# Patient Record
Sex: Female | Born: 1970
Health system: Southern US, Community
[De-identification: ages and names within clinical notes are randomized; demographics above are authoritative.]

## PROBLEM LIST (undated history)

## (undated) DIAGNOSIS — J302 Other seasonal allergic rhinitis: Secondary | ICD-10-CM

## (undated) DIAGNOSIS — M1990 Unspecified osteoarthritis, unspecified site: Secondary | ICD-10-CM

## (undated) DIAGNOSIS — M7750 Other enthesopathy of unspecified foot: Secondary | ICD-10-CM

## (undated) DIAGNOSIS — T7840XA Allergy, unspecified, initial encounter: Secondary | ICD-10-CM

## (undated) DIAGNOSIS — I10 Essential (primary) hypertension: Secondary | ICD-10-CM

## (undated) HISTORY — PX: TUBAL LIGATION: SHX77

## (undated) HISTORY — DX: Allergy, unspecified, initial encounter: T78.40XA

## (undated) HISTORY — DX: Essential (primary) hypertension: I10

## (undated) HISTORY — PX: HEEL SPUR EXCISION: SHX1733

## (undated) HISTORY — PX: CHOLECYSTECTOMY: SHX55

---

## 1997-11-13 ENCOUNTER — Emergency Department (HOSPITAL_COMMUNITY): Admission: EM | Admit: 1997-11-13 | Discharge: 1997-11-13 | Payer: Self-pay | Admitting: Emergency Medicine

## 1997-12-10 ENCOUNTER — Emergency Department (HOSPITAL_COMMUNITY): Admission: EM | Admit: 1997-12-10 | Discharge: 1997-12-10 | Payer: Self-pay | Admitting: *Deleted

## 2003-10-26 ENCOUNTER — Emergency Department (HOSPITAL_COMMUNITY): Admission: EM | Admit: 2003-10-26 | Discharge: 2003-10-26 | Payer: Self-pay | Admitting: Emergency Medicine

## 2004-07-18 ENCOUNTER — Ambulatory Visit: Payer: Self-pay | Admitting: Family Medicine

## 2004-07-20 ENCOUNTER — Ambulatory Visit: Payer: Self-pay | Admitting: Family Medicine

## 2004-07-28 ENCOUNTER — Ambulatory Visit: Payer: Self-pay | Admitting: *Deleted

## 2004-08-10 ENCOUNTER — Emergency Department (HOSPITAL_COMMUNITY): Admission: EM | Admit: 2004-08-10 | Discharge: 2004-08-10 | Payer: Self-pay | Admitting: Family Medicine

## 2005-04-09 ENCOUNTER — Emergency Department (HOSPITAL_COMMUNITY): Admission: EM | Admit: 2005-04-09 | Discharge: 2005-04-09 | Payer: Self-pay | Admitting: Emergency Medicine

## 2005-10-04 ENCOUNTER — Emergency Department (HOSPITAL_COMMUNITY): Admission: EM | Admit: 2005-10-04 | Discharge: 2005-10-04 | Payer: Self-pay | Admitting: Emergency Medicine

## 2005-11-05 ENCOUNTER — Inpatient Hospital Stay (HOSPITAL_COMMUNITY): Admission: EM | Admit: 2005-11-05 | Discharge: 2005-11-06 | Payer: Self-pay | Admitting: Emergency Medicine

## 2006-02-15 ENCOUNTER — Ambulatory Visit: Payer: Self-pay | Admitting: Family Medicine

## 2006-11-28 ENCOUNTER — Emergency Department (HOSPITAL_COMMUNITY): Admission: EM | Admit: 2006-11-28 | Discharge: 2006-11-28 | Payer: Self-pay | Admitting: Emergency Medicine

## 2007-10-01 ENCOUNTER — Emergency Department (HOSPITAL_COMMUNITY): Admission: EM | Admit: 2007-10-01 | Discharge: 2007-10-02 | Payer: Self-pay | Admitting: Emergency Medicine

## 2007-12-03 ENCOUNTER — Emergency Department (HOSPITAL_COMMUNITY): Admission: EM | Admit: 2007-12-03 | Discharge: 2007-12-03 | Payer: Self-pay | Admitting: Emergency Medicine

## 2008-08-30 ENCOUNTER — Emergency Department (HOSPITAL_COMMUNITY): Admission: EM | Admit: 2008-08-30 | Discharge: 2008-08-30 | Payer: Self-pay | Admitting: Emergency Medicine

## 2009-02-19 ENCOUNTER — Emergency Department (HOSPITAL_COMMUNITY): Admission: EM | Admit: 2009-02-19 | Discharge: 2009-02-19 | Payer: Self-pay | Admitting: Emergency Medicine

## 2009-06-01 ENCOUNTER — Emergency Department (HOSPITAL_COMMUNITY): Admission: EM | Admit: 2009-06-01 | Discharge: 2009-06-01 | Payer: Self-pay | Admitting: Emergency Medicine

## 2009-07-13 ENCOUNTER — Emergency Department (HOSPITAL_COMMUNITY): Admission: EM | Admit: 2009-07-13 | Discharge: 2009-07-13 | Payer: Self-pay | Admitting: Emergency Medicine

## 2009-08-02 ENCOUNTER — Inpatient Hospital Stay (HOSPITAL_COMMUNITY): Admission: EM | Admit: 2009-08-02 | Discharge: 2009-08-05 | Payer: Self-pay | Admitting: Emergency Medicine

## 2009-10-17 ENCOUNTER — Emergency Department (HOSPITAL_COMMUNITY): Admission: EM | Admit: 2009-10-17 | Discharge: 2009-10-17 | Payer: Self-pay | Admitting: Emergency Medicine

## 2009-10-18 ENCOUNTER — Inpatient Hospital Stay (HOSPITAL_COMMUNITY): Admission: AD | Admit: 2009-10-18 | Discharge: 2009-10-18 | Payer: Self-pay | Admitting: Obstetrics & Gynecology

## 2010-06-07 ENCOUNTER — Inpatient Hospital Stay (HOSPITAL_COMMUNITY): Admission: EM | Admit: 2010-06-07 | Discharge: 2010-06-10 | Payer: Self-pay | Admitting: Emergency Medicine

## 2010-07-14 ENCOUNTER — Ambulatory Visit: Payer: Self-pay | Admitting: Internal Medicine

## 2010-08-21 ENCOUNTER — Inpatient Hospital Stay (HOSPITAL_COMMUNITY)
Admission: EM | Admit: 2010-08-21 | Discharge: 2010-08-28 | Payer: Self-pay | Source: Home / Self Care | Attending: Internal Medicine | Admitting: Internal Medicine

## 2010-08-22 LAB — DIFFERENTIAL
Basophils Absolute: 0 10*3/uL (ref 0.0–0.1)
Basophils Relative: 0 % (ref 0–1)
Lymphocytes Relative: 39 % (ref 12–46)
Neutro Abs: 2.1 10*3/uL (ref 1.7–7.7)

## 2010-08-22 LAB — BASIC METABOLIC PANEL
Calcium: 8.7 mg/dL (ref 8.4–10.5)
GFR calc Af Amer: >60 mL/min (ref >60)
GFR calc non Af Amer: >60 mL/min (ref >60)
Glucose, Bld: 102 mg/dL — ABNORMAL HIGH (ref 70–99)
Potassium: 3.6 mEq/L (ref 3.5–5.1)
Sodium: 138 mEq/L (ref 135–145)

## 2010-08-22 LAB — CBC
HCT: 34.6 % — ABNORMAL LOW (ref 36.0–46.0)
Hemoglobin: 11.5 g/dL — ABNORMAL LOW (ref 12.0–15.0)
MCHC: 33.2 g/dL (ref 30.0–36.0)
RDW: 14.2 % (ref 11.5–15.5)
WBC: 4.7 10*3/uL (ref 4.0–10.5)

## 2010-08-22 LAB — RAPID STREP SCREEN (MED CTR MEBANE ONLY): Streptococcus, Group A Screen (Direct): NEGATIVE

## 2010-08-23 LAB — BASIC METABOLIC PANEL
CO2: 24 mEq/L (ref 19–32)
Calcium: 9 mg/dL (ref 8.4–10.5)
Creatinine, Ser: 0.65 mg/dL (ref 0.4–1.2)
GFR calc Af Amer: >60 mL/min (ref >60)
GFR calc non Af Amer: >60 mL/min (ref >60)
Sodium: 140 mEq/L (ref 135–145)

## 2010-08-23 LAB — CBC
Hemoglobin: 10.8 g/dL — ABNORMAL LOW (ref 12.0–15.0)
MCH: 28.5 pg (ref 26.0–34.0)
MCHC: 32.7 g/dL (ref 30.0–36.0)
Platelets: 209 10*3/uL (ref 150–400)
RDW: 14.7 % (ref 11.5–15.5)

## 2010-08-23 LAB — STREP A DNA PROBE: Group A Strep Probe: NEGATIVE

## 2010-08-23 LAB — GLUCOSE, CAPILLARY
Glucose-Capillary: 163 mg/dL — ABNORMAL HIGH (ref 70–99)
Glucose-Capillary: 131 mg/dL — ABNORMAL HIGH (ref 70–99)

## 2010-08-24 LAB — GLUCOSE, CAPILLARY
Glucose-Capillary: 144 mg/dL — ABNORMAL HIGH (ref 70–99)
Glucose-Capillary: 140 mg/dL — ABNORMAL HIGH (ref 70–99)

## 2010-08-24 LAB — CBC
HCT: 33.3 % — ABNORMAL LOW (ref 36.0–46.0)
MCH: 28.4 pg (ref 26.0–34.0)
MCHC: 32.4 g/dL (ref 30.0–36.0)
MCV: 87.6 fL (ref 78.0–100.0)
RBC: 3.8 MIL/uL — ABNORMAL LOW (ref 3.87–5.11)
RDW: 15 % (ref 11.5–15.5)

## 2010-08-25 LAB — GLUCOSE, CAPILLARY
Glucose-Capillary: 144 mg/dL — ABNORMAL HIGH (ref 70–99)
Glucose-Capillary: 152 mg/dL — ABNORMAL HIGH (ref 70–99)

## 2010-08-26 LAB — GLUCOSE, CAPILLARY: Glucose-Capillary: 208 mg/dL — ABNORMAL HIGH (ref 70–99)

## 2010-08-27 LAB — GLUCOSE, CAPILLARY: Glucose-Capillary: 148 mg/dL — ABNORMAL HIGH (ref 70–99)

## 2010-08-28 DIAGNOSIS — J383 Other diseases of vocal cords: Secondary | ICD-10-CM

## 2010-08-28 DIAGNOSIS — R0602 Shortness of breath: Secondary | ICD-10-CM

## 2010-08-28 DIAGNOSIS — J45909 Unspecified asthma, uncomplicated: Secondary | ICD-10-CM

## 2010-08-28 LAB — GLUCOSE, CAPILLARY: Glucose-Capillary: 196 mg/dL — ABNORMAL HIGH (ref 70–99)

## 2010-09-10 NOTE — Discharge Summary (Signed)
NAMEWILLENA, Wendy James               ACCOUNT NO.:  [id number]  MEDICAL RECORD NO.:  [id number]          PATIENT TYPE:  INP  LOCATION:  1503                         FACILITY:  [facility]  PHYSICIAN:  Marcellus Scott, MD     DATE OF BIRTH:  [birth year]  DATE OF ADMISSION:  08/21/2010 DATE OF DISCHARGE:  08/28/2010                              DISCHARGE SUMMARY   PRIMARY CARE PHYSICIAN:  Dr. Nanci Pina.  DISCHARGE DIAGNOSES: 1. Dyspnea secondary to vocal cord dysfunction and asthma     exacerbation, improved. 2. Community-acquired pneumonia, treated. 3. Anemia, stable. 4. History of polysubstance abuse, cocaine, tobacco and alcohol.     Cessation counseled. 5. History of pseudoseizures. 6. Hyperglycemia without a diagnosis of diabetes, possibly from     steroids. 7. History of cholecystectomy.  DISCHARGE MEDICATIONS: 1. Albuterol HFA 2 puffs inhaled q. four hourly p.r.n. for dyspnea or     wheezing. 2. Symbicort 160/4.5 mcg inhaler, 2 puffs inhaled b.i.d. 3. Protonix 40 mg p.o. b.i.d. 4. Prednisone taper as per directions.  DISCONTINUED MEDICATIONS:  Advair Diskus.  IMAGING STUDIES: 1. Chest x-ray January 27.  Impression, interval resolution of subtle     left lower lobe infiltrate.  No new focal or acute cardiopulmonary     abnormality suggested. 2. Chest x-ray January 23.  Impression, subtle left lower lobe density     noted in both views.  Suspicious for early left lower lobe     subsegmental atelectasis or atelectatic pneumonia.  PROCEDURES: 1. Pulmonary function tests.  Results are pending. 2. Pertinent labs, serum IgE 773.  CBC, hemoglobin 10.8, hematocrit     33, white blood cell 13.5, platelets 231, hemoglobin A1c 5.6.     Basic metabolic panel only remarkable for glucose of 155.  Group A     strep probe negative.  Rapid strep screen negative.  CONSULTATIONS:  Pulmonary MD, Dr. Sandrea Hughs.  DIET:  Heart-healthy diet.  ACTIVITIES:  Increase activity slowly and  then as tolerated.  COMPLAINTS:  Mild cough and intermittent mild dyspnea but much improved compared to on admission.  PHYSICAL EXAMINATION:  GENERAL:  The patient is in no obvious distress. VITAL SIGNS:  Temperature 98.4 degrees Fahrenheit, pulse 71 per minute, respiration 18 per minute, blood pressure 127/73 mmHg, oxygen saturation of 95% on room air. RESPIRATORY SYSTEM:  No increased work of breathing.  Good breath sounds bilaterally with occasional rhonchi.  CARDIOVASCULAR SYSTEM:  First and second heart sounds heard, regular. ABDOMEN:  Soft and bowel sounds present. CENTRAL NERVOUS SYSTEM:  The patient awake, alert, oriented x3 with no focal neurological deficits.  HOSPITAL COURSE:  Wendy James is a 40 year old female patient with history of asthma, seizures versus pseudoseizures, prior substance abuse including alcohol, cocaine and tobacco who presented with worsening dyspnea, sore throat, productive cough.  She was admitted for further evaluation and management.  1. Dyspnea secondary to vocal cord dysfunction and acute asthma     exacerbation.  The patient was admitted to the hospital.     Initially, it was thought that this was all due to asthma     exacerbation from her  pneumonia.  She was placed on IV steroids,     nebulizations and oxygen.  Despite treating her pneumonia and     measures for asthma, dyspnea actually did not resolve promptly.     Pulmonary physicians were consulted.  They evaluated her and     indicated that most of her dyspnea was probably secondary to vocal     cord dysfunction and the powdered Advair was making it worse.  They     discontinued her Advair and started her on Symbicort.  She has     clinically done well.  She has completed her lung functions today.     Pulmonologist have seen her today and indicate that she can be     discharged home on the above medications.  She is to follow up with     Dr. Sherene Sires in 2 weeks in his office with all inhalers  and     medications. 2. Community-acquired pneumonia.  Treated. 3. History of polysubstance abuse.  Cessation counseling advised and     the patient verbalizes understanding. 4. History of chronic anemia.  Outpatient evaluation as deemed     necessary.  DISPOSITION:  The patient is discharged home in a stable condition.  FOLLOWUP RECOMMENDATIONS: 1. With Dr. Nanci Pina at [facility] on February 1 at     11:15 a.m. 2. With Dr.  Sandrea Hughs.  In 2 weeks' time, the patient is to call     for an appointment and is to present herself with all the inhalers     and medications.  Time taken in coordinating this discharge is 35 minutes.     Marcellus Scott, MD     AH/MEDQ  D:  08/28/2010  T:  08/29/2010  Job:  [id number]  cc:   Dr. Leland Johns. Sherene Sires, MD, FCCP [address] [place] [address]  Electronically Signed by Marcellus Scott MD on 09/10/2010 11:01:31 PM

## 2010-09-26 NOTE — Consult Note (Signed)
NAMEGENEVEIVE, FURNESS NO.:  [id number]  MEDICAL RECORD NO.:  [id number]          PATIENT TYPE:  INP  LOCATION:  1503                         FACILITY:  [facility]  PHYSICIAN:  Felipa Evener, MD  DATE OF BIRTH:  Sep 14, 1970  DATE OF CONSULTATION: DATE OF DISCHARGE:                                CONSULTATION   IDENTIFICATION:  The patient is a 40 year old female with past medical history significant for asthma, as well as a pseudoseizure, and cocaine abuse which last use reported in 2007 who presents to the hospital with a chief complaint of shortness of breath.  The patient is a [facility] patient and has been using Advair 250/50 one puff only once daily in order to conserve it since she has no money to buy more, and she has been using albuterol only intermittently approximately once a day on regular basis except for the last 3 days prior to presenting to the hospital where she was using it approximately 4-5 times a day.  The patient reports that her symptoms are mostly nocturnal, and she uses the Advair in the morning.  She reports some cough productive of green sputum that has cleared with antibiotics since the patient has been in the hospital.  She denied any fever, chills, nausea, vomiting, abdominal pain, or chest pain.  Denied any changes in her urine or bowel habits. Denied any recent exposure to any chemicals.  Denied any exacerbating factors for her asthma.  The patient does report significant history of acid reflux disease, as well as frequent throat clearings.  PAST MEDICAL HISTORY:  Significant for asthma, no previous intubation but pseudoseizures without recurrence several years ago, history of alcohol abuse, history of cocaine abuse with last urine drug screen positive in 2007, and remote cholecystectomy.  ALLERGIES:  No known drug allergies.  MEDICATION:  Advair 250/50 one puff b.i.d.  FAMILY HISTORY:  Positive for mother, as well as a son,  with asthma but otherwise noncontributory.  SOCIAL HISTORY:  She is single, currently living with her cousin. Remote history of tobacco abuse; however, she denies alcohol or illegal drug use.  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  A 12-point review of systems is negative other than mentioned above.  PHYSICAL EXAMINATION:  GENERAL:  This is a well-appearing African American female, resting comfortably in the exam bed, in acute distress. VITAL SIGNS:  Temperature 99.4, heart rate 80, respiratory rate is 18, blood pressure 125/77, and saturation 94% on room air. HEENT:  Normocephalic and atraumatic.  Pupils equal, round, and reactive to light.  Extraocular movements are intact.  Oral and nasal mucosa within normal limits. NECK:  No thyromegaly or lymphadenopathy.  No hepatojugular reflux appreciated. HEART:  Regular rate and rhythm.  S1 and S2.  No murmurs, rubs, or gallops appreciated. LUNGS:  Inspiratory and expiratory wheezes that increased in intensity as I approach the apices of the lungs.  There was significant "wheezing" that was present around the neck that disappeared with the patient's pursed lip breathing. ABDOMEN:  Soft, nontender.  Positive bowel sounds. EXTREMITIES:  No  edema, __________ appreciated. NEUROLOGIC:  Grossly intact.  LABORATORY STUDIES:  Reviewed, significant for CBC with a white blood cell count of 13.5, hemoglobin 10.8 hematocrit 32.3, and a platelet count of 231.  Hemoglobin A1c 5.6.  BMP:  Sodium 140, potassium 4.2, chloride 110, CO2 of 24, glucose of 155, BUN is 8, creatinine is 0.65, and calcium 9.0.  Group A strep from throat was also negative.  ASSESSMENT AND PLAN:  The patient is a 40 year old female with past medical history significant for pseudoseizure, cocaine abuse who presented to the hospital with an asthma exacerbation that did not resolve with all current treatment  and did not resolve with continuous albuterol.  The patient does have significant acid reflux, as well as vocal cord dysfunction, and the patient has not been using her Advair properly, she has only been using once a day and reports that most of her symptoms are at night which will be consistent with half life of Advair.  Therefore, at this time, we will recommend increasing that Advair 250/50 one puff b.i.d.  We will place the patient on Protonix on a b.i.d. basis.  I agree with the steroids per the primary team as well as the antibiotic use.  We will add p.r.n. albuterol as well as IS per RT protocol.  The patient will need some PFTs once is more stable and will need a followup with Pulmonary Critical Care in [place] office upon discharge, and at this point, we will continue to follow with you.     Felipa Evener, MD     WJY/MEDQ  D:  08/26/2010  T:  08/27/2010  Job:  [id number]  Electronically Signed by Koren Bound MD on 09/26/2010 11:52:18 AM

## 2010-10-10 LAB — CBC
Hemoglobin: 10.3 g/dL — ABNORMAL LOW (ref 12.0–15.0)
Hemoglobin: 10.6 g/dL — ABNORMAL LOW (ref 12.0–15.0)
MCH: 31.1 pg (ref 26.0–34.0)
MCH: 31.4 pg (ref 26.0–34.0)
MCH: 31.5 pg (ref 26.0–34.0)
MCHC: 33.8 g/dL (ref 30.0–36.0)
MCHC: 33.8 g/dL (ref 30.0–36.0)
MCV: 91.6 fL (ref 78.0–100.0)
MCV: 92.8 fL (ref 78.0–100.0)
Platelets: 275 10*3/uL (ref 150–400)
Platelets: 302 10*3/uL (ref 150–400)
RBC: 3.36 MIL/uL — ABNORMAL LOW (ref 3.87–5.11)
RBC: 3.51 MIL/uL — ABNORMAL LOW (ref 3.87–5.11)
RDW: 14.6 % (ref 11.5–15.5)
RDW: 14.7 % (ref 11.5–15.5)
WBC: 7.3 10*3/uL (ref 4.0–10.5)

## 2010-10-10 LAB — COMPREHENSIVE METABOLIC PANEL
ALT: 25 U/L (ref 0–35)
Albumin: 3.2 g/dL — ABNORMAL LOW (ref 3.5–5.2)
Alkaline Phosphatase: 59 U/L (ref 39–117)
GFR calc Af Amer: >60 mL/min (ref >60)
Potassium: 3.7 mEq/L (ref 3.5–5.1)
Sodium: 141 mEq/L (ref 135–145)
Total Protein: 7.3 g/dL (ref 6.0–8.3)

## 2010-10-10 LAB — FERRITIN: Ferritin: 20 ng/mL (ref 10–291)

## 2010-10-10 LAB — DIFFERENTIAL
Basophils Absolute: 0 10*3/uL (ref 0.0–0.1)
Basophils Relative: 0 % (ref 0–1)
Basophils Relative: 0 % (ref 0–1)
Eosinophils Absolute: 0 10*3/uL (ref 0.0–0.7)
Eosinophils Absolute: 0 10*3/uL (ref 0.0–0.7)
Eosinophils Relative: 0 % (ref 0–5)
Eosinophils Relative: 0 % (ref 0–5)
Lymphs Abs: 2.8 10*3/uL (ref 0.7–4.0)
Monocytes Relative: 3 % (ref 3–12)
Neutrophils Relative %: 72 % (ref 43–77)
Neutrophils Relative %: 89 % — ABNORMAL HIGH (ref 43–77)

## 2010-10-10 LAB — BASIC METABOLIC PANEL
BUN: 10 mg/dL (ref 6–23)
CO2: 24 mEq/L (ref 19–32)
Chloride: 107 mEq/L (ref 96–112)
Creatinine, Ser: 0.74 mg/dL (ref 0.4–1.2)
GFR calc Af Amer: >60 mL/min (ref >60)

## 2010-10-10 LAB — IRON AND TIBC
Iron: 44 ug/dL (ref 42–135)
UIBC: 304 ug/dL

## 2010-10-10 LAB — RETICULOCYTES
RBC.: 3.54 MIL/uL — ABNORMAL LOW (ref 3.87–5.11)
Retic Ct Pct: 1.4 % (ref 0.4–3.1)

## 2010-10-10 NOTE — H&P (Signed)
NAMEJERA, HEADINGS               ACCOUNT NO.:  [id number]  MEDICAL RECORD NO.:  [id number]          PATIENT TYPE:  INP  LOCATION:  0101                         FACILITY:  [facility]  PHYSICIAN:  Erick Blinks, MD     DATE OF BIRTH:  12/27/1970  DATE OF ADMISSION:  08/21/2010 DATE OF DISCHARGE:                             HISTORY & PHYSICAL   PRIMARY CARE PHYSICIAN:  Insurance underwriter.  CHIEF COMPLAINT:  Shortness of breath.  HISTORY OF PRESENT ILLNESS:  Ms. Wendy James is a 40 year old African American female with past medical history of asthma, who presents to [facility] emergency room with reports of progressive shortness of breath for approximately 2 days.  The patient also reports sore throat and cough, productive of green sputum for 2 days.  She denies any recent fever, chills, chest pain, abdominal pain, nausea, vomiting, diarrhea.  The patient reports symptoms have improved with 1-hour long nebulizer treatments administered in the emergency department, however, the patient does have persistent wheezing prompting triad hospitalist be called for admission.  PAST MEDICAL HISTORY: 1. Asthma. 2. Probable seizure without recurrence. 3. History of EtOH abuse. 4. Cocaine abuse with positive urine drug screen in 2007 admission. 5. Remote cholecystectomy.  ALLERGIES:  No known drug allergies.  MEDICATIONS:  Advair 250/50 inhaled b.i.d.  FAMILY HISTORY:  Has been reviewed and is noncontributory to this admission.  SOCIAL HISTORY:  The patient is single.  She is currently living with a cousin.  She reports of a remote history of tobacco abuse.  However, she denies any current tobacco abuse, EtOH use or illegal drug use.  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  REVIEW OF SYSTEMS:  As stated in HPI, otherwise negative.  PHYSICAL EXAMINATION:  VITAL SIGNS:  Blood pressure 152/89, heart rate 100, respirations 24, temperature 100.6. GENERAL:  This is a  well-nourished, well-developed [place] American female lying in stretcher talking on the phone on my arrival to room. HEENT:  Head is normocephalic, atraumatic.  Eyes, extraocular movements intact.  Ears, nose, throat, mucous membranes are moist.  The patient does have some posterior pharynx erythema without any swelling or exudates.  Posterior tongue does have some raised bumps, uncertain whether these are chronic. NECK:  Supple with no thyromegaly, lymphadenopathy.  No JVD or carotid bruits. CHEST:  Symmetrical movement, nontender palpation. CARDIOVASCULAR:  S1 and S2, regular rate and rhythm.  No murmurs, rubs, or gallops. EXTREMITIES:  No lower extremity edema. RESPIRATORY:  Patient with end expiratory wheeze bilaterally throughout all lung fields.  No increased work of breathing.  No rales or crackles. GASTROINTESTINAL:  Abdomen is soft, nontender, nondistended with positive bowel sounds.  No appreciated masses or hepatosplenomegaly. NEUROLOGICAL:  The patient is able to move all extremities x4 withoutmotor sensory deficit on exam. PSYCHOLOGICAL:  The patient is alert and oriented x4 with normal mood and affect.  LABORATORY DATA:  White cell count 4.7, platelet count 201, hemoglobin 11.5, hematocrit 34.6, sodium 138, potassium 3.6, chloride 108, CO2 22, BUN 5, creatinine 0.67, serum glucose 102.  Rapid strep is negative.  Chest x-ray showing left lower lobe atelectasis  versus pneumonia.  ASSESSMENT/PLAN: 1. Acute asthma exacerbation.  We will admit the patient overnight to     regular medical floor.  We will order for nebulizer treatments and     IV Solu-Medrol. 2. Community-acquired pneumonia.  We will order for empiric Avelox.     The patient is afebrile with normal white cell count and nontoxic-     appearing. 3. Prophylaxis.  We will order for subcu Lovenox for DVT prophylaxis.     Cordelia Pen, NP   ______________________________ Erick Blinks,  MD    LE/MEDQ  D:  08/21/2010  T:  08/21/2010  Job:  [id number]  cc:   Dr. Dennard Nip [facility]  Electronically Signed by Cordelia Pen NP on 09/15/2010 12:06:54 PM Electronically Signed by Durward Mallard Ariell Gunnels  on 10/10/2010 05:45:25 PM

## 2010-10-15 LAB — URINALYSIS, ROUTINE W REFLEX MICROSCOPIC
Bilirubin Urine: NEGATIVE
Glucose, UA: NEGATIVE mg/dL
Hgb urine dipstick: NEGATIVE
Ketones, ur: NEGATIVE mg/dL
Nitrite: NEGATIVE
Protein, ur: NEGATIVE mg/dL
Specific Gravity, Urine: 1.018 (ref 1.005–1.030)
Urobilinogen, UA: 1 mg/dL (ref 0.0–1.0)
pH: 6 (ref 5.0–8.0)

## 2010-10-15 LAB — CULTURE, RESPIRATORY W GRAM STAIN: Culture: NORMAL

## 2010-10-15 LAB — CBC
HCT: 35.8 % — ABNORMAL LOW (ref 36.0–46.0)
Hemoglobin: 12.1 g/dL (ref 12.0–15.0)
MCHC: 32.8 g/dL (ref 30.0–36.0)
MCHC: 33.8 g/dL (ref 30.0–36.0)
MCV: 90.6 fL (ref 78.0–100.0)
MCV: 90.7 fL (ref 78.0–100.0)
Platelets: 216 10*3/uL (ref 150–400)
Platelets: 219 K/uL (ref 150–400)
RBC: 3.95 MIL/uL (ref 3.87–5.11)
RDW: 14.2 % (ref 11.5–15.5)
RDW: 15.4 % (ref 11.5–15.5)
WBC: 3.2 10*3/uL — ABNORMAL LOW (ref 4.0–10.5)

## 2010-10-15 LAB — DIFFERENTIAL
Basophils Absolute: 0 K/uL (ref 0.0–0.1)
Basophils Relative: 0 % (ref 0–1)
Eosinophils Absolute: 0 K/uL (ref 0.0–0.7)
Eosinophils Relative: 0 % (ref 0–5)
Lymphocytes Relative: 21 % (ref 12–46)
Lymphs Abs: 0.7 K/uL (ref 0.7–4.0)
Monocytes Absolute: 0.1 10*3/uL (ref 0.1–1.0)
Monocytes Relative: 4 % (ref 3–12)
Neutro Abs: 2.4 10*3/uL (ref 1.7–7.7)
Neutrophils Relative %: 75 % (ref 43–77)

## 2010-10-15 LAB — BASIC METABOLIC PANEL WITH GFR
BUN: 6 mg/dL (ref 6–23)
CO2: 24 meq/L (ref 19–32)
Chloride: 106 meq/L (ref 96–112)
Glucose, Bld: 136 mg/dL — ABNORMAL HIGH (ref 70–99)
Potassium: 3.6 meq/L (ref 3.5–5.1)

## 2010-10-15 LAB — BASIC METABOLIC PANEL
BUN: 11 mg/dL (ref 6–23)
CO2: 25 mEq/L (ref 19–32)
Calcium: 8.5 mg/dL (ref 8.4–10.5)
Calcium: 8.6 mg/dL (ref 8.4–10.5)
Chloride: 107 mEq/L (ref 96–112)
Creatinine, Ser: 0.72 mg/dL (ref 0.4–1.2)
Creatinine, Ser: 0.76 mg/dL (ref 0.4–1.2)
GFR calc Af Amer: >60 mL/min (ref >60)
GFR calc Af Amer: >60 mL/min (ref >60)
GFR calc non Af Amer: >60 mL/min (ref >60)
Sodium: 138 mEq/L (ref 135–145)

## 2010-10-15 LAB — HEMOGLOBIN A1C
Hgb A1c MFr Bld: 5.5 % (ref 4.6–6.1)
Mean Plasma Glucose: 111 mg/dL

## 2010-10-23 LAB — CBC
Hemoglobin: 11.4 g/dL — ABNORMAL LOW (ref 12.0–15.0)
MCHC: 32.9 g/dL (ref 30.0–36.0)
MCHC: 33.7 g/dL (ref 30.0–36.0)
MCV: 92.1 fL (ref 78.0–100.0)
Platelets: 180 10*3/uL (ref 150–400)
RBC: 3.76 MIL/uL — ABNORMAL LOW (ref 3.87–5.11)
RDW: 16.1 % — ABNORMAL HIGH (ref 11.5–15.5)

## 2010-10-23 LAB — URINE MICROSCOPIC-ADD ON

## 2010-10-23 LAB — URINALYSIS, ROUTINE W REFLEX MICROSCOPIC
Bilirubin Urine: NEGATIVE
Leukocytes, UA: NEGATIVE
Nitrite: NEGATIVE
Specific Gravity, Urine: 1.013 (ref 1.005–1.030)
Urobilinogen, UA: 0.2 mg/dL (ref 0.0–1.0)

## 2010-10-23 LAB — DIFFERENTIAL
Basophils Absolute: 0 10*3/uL (ref 0.0–0.1)
Basophils Relative: 1 % (ref 0–1)
Lymphocytes Relative: 51 % — ABNORMAL HIGH (ref 12–46)
Monocytes Absolute: 0.3 10*3/uL (ref 0.1–1.0)
Neutro Abs: 1.6 10*3/uL — ABNORMAL LOW (ref 1.7–7.7)
Neutrophils Relative %: 37 % — ABNORMAL LOW (ref 43–77)

## 2010-10-23 LAB — POCT I-STAT, CHEM 8
Calcium, Ion: 1.09 mmol/L — ABNORMAL LOW (ref 1.12–1.32)
Chloride: 109 mEq/L (ref 96–112)
HCT: 37 % (ref 36.0–46.0)
Potassium: 3.7 mEq/L (ref 3.5–5.1)
Sodium: 139 mEq/L (ref 135–145)

## 2010-10-23 LAB — POCT PREGNANCY, URINE: Preg Test, Ur: NEGATIVE

## 2010-11-05 LAB — WET PREP, GENITAL
Clue Cells Wet Prep HPF POC: NONE SEEN
Trich, Wet Prep: NONE SEEN

## 2010-11-05 LAB — URINALYSIS, ROUTINE W REFLEX MICROSCOPIC
Bilirubin Urine: NEGATIVE
Hgb urine dipstick: NEGATIVE
Ketones, ur: NEGATIVE mg/dL
Nitrite: NEGATIVE
Specific Gravity, Urine: 1.036 — ABNORMAL HIGH (ref 1.005–1.030)
Urobilinogen, UA: 0.2 mg/dL (ref 0.0–1.0)

## 2010-11-05 LAB — GC/CHLAMYDIA PROBE AMP, GENITAL
Chlamydia, DNA Probe: NEGATIVE
GC Probe Amp, Genital: NEGATIVE

## 2010-11-05 LAB — PREGNANCY, URINE: Preg Test, Ur: NEGATIVE

## 2010-11-10 ENCOUNTER — Inpatient Hospital Stay (INDEPENDENT_AMBULATORY_CARE_PROVIDER_SITE_OTHER)
Admission: RE | Admit: 2010-11-10 | Discharge: 2010-11-10 | Disposition: A | Discharge status: Home / Self Care | Payer: Self-pay | Source: Ambulatory Visit | Attending: Family Medicine | Admitting: Family Medicine

## 2010-11-10 DIAGNOSIS — A64 Unspecified sexually transmitted disease: Secondary | ICD-10-CM

## 2010-11-10 LAB — POCT URINALYSIS DIP (DEVICE)
Glucose, UA: NEGATIVE mg/dL
Ketones, ur: NEGATIVE mg/dL
Protein, ur: NEGATIVE mg/dL
Specific Gravity, Urine: 1.015 (ref 1.005–1.030)
Urobilinogen, UA: 0.2 mg/dL (ref 0.0–1.0)

## 2010-11-10 LAB — POCT PREGNANCY, URINE: Preg Test, Ur: NEGATIVE

## 2010-11-10 LAB — WET PREP, GENITAL: Yeast Wet Prep HPF POC: NONE SEEN

## 2010-11-11 LAB — GC/CHLAMYDIA PROBE AMP, GENITAL: Chlamydia, DNA Probe: NEGATIVE

## 2010-12-10 ENCOUNTER — Emergency Department (HOSPITAL_COMMUNITY): Payer: Self-pay

## 2010-12-10 ENCOUNTER — Emergency Department (HOSPITAL_COMMUNITY)
Admission: EM | Admit: 2010-12-10 | Discharge: 2010-12-11 | Disposition: A | Discharge status: Home / Self Care | Payer: Self-pay | Attending: Emergency Medicine | Admitting: Emergency Medicine

## 2010-12-10 DIAGNOSIS — M62838 Other muscle spasm: Secondary | ICD-10-CM | POA: Insufficient documentation

## 2010-12-10 DIAGNOSIS — R109 Unspecified abdominal pain: Secondary | ICD-10-CM | POA: Insufficient documentation

## 2010-12-10 DIAGNOSIS — J45909 Unspecified asthma, uncomplicated: Secondary | ICD-10-CM | POA: Insufficient documentation

## 2010-12-10 DIAGNOSIS — M549 Dorsalgia, unspecified: Secondary | ICD-10-CM | POA: Insufficient documentation

## 2010-12-10 DIAGNOSIS — R1110 Vomiting, unspecified: Secondary | ICD-10-CM | POA: Insufficient documentation

## 2010-12-10 LAB — COMPREHENSIVE METABOLIC PANEL
ALT: 20 U/L (ref 0–35)
Alkaline Phosphatase: 69 U/L (ref 39–117)
Chloride: 104 mEq/L (ref 96–112)
Glucose, Bld: 112 mg/dL — ABNORMAL HIGH (ref 70–99)
Potassium: 4.1 mEq/L (ref 3.5–5.1)
Sodium: 138 mEq/L (ref 135–145)
Total Bilirubin: 0.4 mg/dL (ref 0.3–1.2)
Total Protein: 7.2 g/dL (ref 6.0–8.3)

## 2010-12-10 LAB — URINALYSIS, ROUTINE W REFLEX MICROSCOPIC
Bilirubin Urine: NEGATIVE
Glucose, UA: NEGATIVE mg/dL
Hgb urine dipstick: NEGATIVE
Nitrite: NEGATIVE
Specific Gravity, Urine: 1.021 (ref 1.005–1.030)
pH: 7.5 (ref 5.0–8.0)

## 2010-12-10 LAB — CBC
HCT: 33.9 % — ABNORMAL LOW (ref 36.0–46.0)
Hemoglobin: 11.1 g/dL — ABNORMAL LOW (ref 12.0–15.0)
MCH: 28.5 pg (ref 26.0–34.0)
MCV: 87.1 fL (ref 78.0–100.0)
Platelets: 222 10*3/uL (ref 150–400)
RBC: 3.89 MIL/uL (ref 3.87–5.11)
WBC: 5.5 10*3/uL (ref 4.0–10.5)

## 2010-12-10 LAB — DIFFERENTIAL
Lymphocytes Relative: 52 % — ABNORMAL HIGH (ref 12–46)
Lymphs Abs: 2.9 10*3/uL (ref 0.7–4.0)
Monocytes Relative: 11 % (ref 3–12)
Neutro Abs: 1.8 10*3/uL (ref 1.7–7.7)
Neutrophils Relative %: 33 % — ABNORMAL LOW (ref 43–77)

## 2010-12-10 LAB — LIPASE, BLOOD: Lipase: 37 U/L (ref 11–59)

## 2010-12-15 NOTE — Consult Note (Signed)
NAMEMARVINA, James               ACCOUNT NO.:  [id number]   MEDICAL RECORD NO.:  [id number]          PATIENT TYPE:  INP   LOCATION:  1426                         FACILITY:  [facility]   PHYSICIAN:  Antonietta Breach, M.D.  DATE OF BIRTH:  Jun 27, 1971   DATE OF CONSULTATION:  11/06/2005  DATE OF DISCHARGE:  11/06/2005                                   CONSULTATION   REFERRING PHYSICIAN:  Isidor Holts, M.D.   REASON FOR CONSULTATION:  Substance abuse and rule out depression, rule out  anxiety.   HISTORY OF PRESENT ILLNESS:  Wendy James is a 40 year old female admitted for  a seizure episode.   Wendy James was celebrating her birthday on [birth year]. She drank alcohol  although the specific amount is not known.  While sitting in a chair,  apparently she passed out.  She does not recall the event.  Onlookers  reported a convulsion.  The patient did not have any incontinence or tongue  biting.  She does have a history of a prior seizure six years ago.   The patient does have ongoing worries.  She stated that her daughter was not  there for the party.   There is no evidence of any severe anxiety conditions and please see the  substance abuse history below.   PAST PSYCHIATRIC HISTORY:  There is no known history of mania or psychosis.  The patient does acknowledge having used cocaine but states that it was her  first time.  She has stated that this particular episode of substance abuse  has frightened. She states that when she drinks, she usually drinks four to  five mixed drinks at a time and has been drinking since age 15.  She has  been undergoing the Ativan detox protocol and has no symptoms of withdrawal  currently.  There is no history of major depression.   FAMILY PSYCHIATRIC HISTORY:  None known.   SOCIAL HISTORY:  Marital:  Single.  Occupation:  Works in home health care.  Children:  Three children. She does not drink alcohol every day. She does  admit to utilizing marijuana  remotely.   GENERAL MEDICAL PROBLEMS:  1.  History of prior seizure.  2.  Bronchial asthma.   PAST SURGICAL HISTORY:  Cholecystectomy for cholelithiasis.   LABORATORY DATA:  Hemoglobin 12.9, white blood cell count 7.8, platelets  248.  BUN 8, creatinine 0.8.  Liver function tests are normal.  Alcohol  level was 59 on admission.  Urine pregnancy test is negative.  Urine drug  screen was positive for cocaine and THC.  Head CT scan on April 9 showed no  acute findings.   REVIEW OF SYSTEMS:  CONSTITUTIONAL:  The patient is afebrile.  EYES:  No  visual changes.  EARS:  No hearing impairment.  NOSE:  No rhinorrhea.  THROAT:  No sore throat.  CARDIOVASCULAR:  No chest pain, palpitations or  edema.  RESPIRATORY:  No coughing or wheezing.  GASTROINTESTINAL:  No nausea  or vomiting or diarrhea.  GENITOURINARY:  No dysuria.  MUSCULOSKELETAL:  No  deformities, weaknesses or atrophy.  SKIN:  Unremarkable.  NEUROLOGIC:  As  above.  PSYCHIATRIC:  As above.  ENDOCRINE:  Unremarkable.  HEMATOLOGIC/LYMPHATICS:  Unremarkable.   ALLERGIES:  No known drug allergies.   PHYSICAL EXAMINATION:  VITAL SIGNS: Temperature 98.5, pulse 84, respirations  18, blood pressure 128/62, oxygen saturation 98% on room air.  MENTAL STATUS:  Wendy James is alert.  She is oriented to all spheres.  Her  speech is slightly anxious.  Eye contact is good.  Fund of knowledge and  intelligence greater than average.  Thought process logical, coherent, goal-  directed.  No looseness of association, thought content.  No thoughts of  harming herself.  No thoughts of harming others.  No delusions.  No  hallucinations.  Her memory is intact to immediate, recent, remote except  for the acute convulsive period described above.  Concentration is grossly  within normal limits.  Insight is partial in that the patient is  acknowledges that this particular episode of substance abuse has frightened  her and she states she is motivated for AA.   Judgment is grossly intact.  Mood is mildly anxious.  No evidence of panic or other severe anxiety  symptoms.   ASSESSMENT:  Axis I  1.  Anxiety disorder, not otherwise specified  (provisional, the patient appears to have some reactive worries regarding a  relationship.  There are also contributing factors of acute cocaine use and  marijuana use).  1.  Polysubstance abuse versus dependence.  Axis II  Deferred.  Axis III  See general medical problems above.  Axis IV  Primary support group.  Axis V  55.   The patient is not at risk to harm herself or others.  Her capacity for  informed consent is intact. Her judgment has now returned now that she is  recovered from acute intoxication.   RECOMMENDATIONS:  1.  It is recommended to the patient that she attend an inpatient chemical-      dependency rehabilitation program.  However, the patient declined and      she does not meet commitment criteria.  She states that she is motivated      for the 12-step community and the patient intends to attend [facility]      [facility].  2.  If the patient presents withdrawal symptoms, then would proceed with the      Ativan withdrawal protocol.  3.  The patient agrees to use emergency services as needed for psychiatric      emergent symptoms.      Antonietta Breach, M.D.  Electronically Signed     JW/MEDQ  D:  01/27/2006  T:  01/28/2006  Job:  [id number]

## 2010-12-15 NOTE — Discharge Summary (Signed)
NAMEDUSTIE, BRITTLE               ACCOUNT NO.:  [id number]   MEDICAL RECORD NO.:  [id number]          PATIENT TYPE:  INP   LOCATION:  1426                         [facility]ACILITY[facility]   PHYSICIAN:  Lonia Blood, M.D.       DATE OF BIRTH:  Jan 16, 1971   DATE OF ADMISSION:  11/04/2005  DATE OF DISCHARGE:  11/06/2005                                 DISCHARGE SUMMARY   DISCHARGE DIAGNOSIS:  1.  Probable seizure, no recurrence.  2.  Alcohol abuse.  3.  Cocaine use.  4.  Mild asthma.  5.  Tobacco abuse.  6.  Status post cholecystectomy.   DISCHARGE MEDICATIONS:  1.  Albuterol inhaler p.r.n.  2.  Vitamin B1 100 mg daily.  3.  Folic acid 1 mg daily.  4.  Loratadine 10 mg daily.  5.  Nicotine patch 14 mg daily.  6.  Tylenol 700 mg p.r.n. pain.   CONDITION ON DISCHARGE:  Ms. Windle Guard was discharged in good condition. At the  time of discharge she was alert, oriented without any significant  discomfort. She was instructed to follow up with [facility]  as well as with [facility].   PROCEDURE:  Ms. Windle Guard had a head CT without contrast that was within normal  limits.   CONSULTATIONS:  The patient was seen in consultation by Dr. Antonietta Breach  from psychiatry.   For admission history and physical refer to the H&P done by Dr. Brien Few.   HOSPITAL COURSE:  PROBLEM #1. Probable seizure. Ms. Windle Guard was admitted to  [facility]. She was observed closely in the telemetry unit. There  was no recurrence of any seizure activity here in the hospital and patient  remained without any alteration in her mental status. At this point in time  given the fact that Ms. Potts had presence of cocaine in her system as well  as alcohol abuse, we would not treat her with an  antiepileptic unless she has a seizure without any toxins on board. The  patient will be discharged home with instructions to abstain from working or  driving for a week. If she has any recurrence of problems to  present back to  the emergency room.  PROBLEM #2. Alcohol abuse. Ms. Windle Guard was started on folic acid, thiamin. She  did not display any symptoms of  withdrawal. She was also given the number of [facility] [employer] and  instructed to follow up with them.  PROBLEM #3. Cocaine abuse. Ms. Windle Guard was advised about the dangers of using  cocaine and she was counseled against further use.      Lonia Blood, M.D.  Electronically Signed     SL/MEDQ  D:  11/06/2005  T:  11/06/2005  Job:  [id number]   cc:   [facility]

## 2010-12-15 NOTE — H&P (Signed)
NAMEKRISTELL, James               ACCOUNT NO.:  [id number]   MEDICAL RECORD NO.:  [id number]          PATIENT TYPE:  INP   LOCATION:  0101                         FACILITY:  [facility]   PHYSICIAN:  Isidor Holts, M.D.  DATE OF BIRTH:  02/07/1971   DATE OF ADMISSION:  11/05/2005  DATE OF DISCHARGE:                                HISTORY & PHYSICAL   PRIMARY MEDICAL DOCTOR:  Unassigned, usually attends [facility].   CHIEF COMPLAINT:  Seizure episode.   HISTORY OF PRESENT ILLNESS:  This is a 40 year old female.  For past medical  history, see below.  The patient apparently, was celebrating her birthday on  November 04, 2007 and therefore had a Editor, commissioning.  Obviously during the course  of this, she drank some alcohol, although the specific amount is not known.  According to the patient, while she was sitting in a chair, she passed  out.  The patient is unable to describe any further events related to this,  however, according to observer accounts, she appears to have had seizure-  like activity.  There was no incontinence, no tongue-biting.  The patient  was brought to the emergency department.  According to the patient, she has  had a similar episode approximately 6 years ago.   PAST MEDICAL HISTORY:  1.  Seizure episode 6 years ago.  2.  Bronchial asthma.  3.  Status post gallbladder surgery, for cholelithiasis.  4.  ETOH excess.   MEDICATIONS:  Albuterol MDI p.r.n.   ALLERGIES:  No known drug allergies.   REVIEW OF SYSTEMS:  Systems review negative.  The patient denies fever,  abdominal pain, vomiting and diarrhea or chest pain.   SOCIAL HISTORY:  The patient is single, works in home health care, has 3  offspring who are live and well, smokes approximately 1/2 pack of cigarettes  per day and drinks cocktails, i.e., spirits, usually about 2-3 glasses at a  time, although she states she does not drink everyday.  Denies drug abuse,  although she admits to utilizing marijuana, but  states the last time was a  long time ago.   FAMILY HISTORY:  Both parents are alive and well.  Family history is  otherwise noncontributory.   PHYSICAL EXAMINATION:  VITAL SIGNS:  Temperature 98.5, pulse 84 per minute,  regular, respiratory rate of 18, BP 128/62 mmHg.  Pulse oximetry 98% on room  air.  GENERAL:  The patient was sleeping at the time of this evaluation, but was  easily rousable and once roused, was alert and oriented to person, place and  time, and quite communicative, not in obvious acute distress, not short of  breath at rest.  HEENT:  No clinical pallor or jaundice.  No conjunctival injection.  Throat  is clear.  NECK:  Supple.  JVP not seen.  No palpable lymphadenopathy.  No palpable  goiter.  CHEST:  Occasional expiratory rhonchi, no crackles.  CARDIAC:  Heart sounds 1 and 2 heard, normal, regular.  No murmurs.  ABDOMEN:  Full, soft and nontender and moderately obese.  No palpable  organomegaly.  No palpable masses.  Normal bowel sounds.  EXTREMITIES:  On lower extremity examination, no pitting edema, palpable  peripheral pulses.  MUSCULOSKELETAL:  Examination is quite unremarkable.  CENTRAL NERVOUS SYSTEM:  No focal neurologic deficits on gross examination.   INVESTIGATIONS:  CBC:  WBC 7.8, hemoglobin 12.9, hematocrit 38.7, platelets  248,000.  Electrolytes:  Sodium 139, potassium 3.8, chloride 109, CO2 26,  BUN 8, creatinine 0.8, glucose 102.  LFTs are normal.  Alcohol level 59.  Magnesium 2.2.  Urine pregnancy test is negative.  Urinalysis is negative.  Urine drug screen is positive for cocaine and tetrahydrocannabinol.   Head CT scan dated November 05, 2005 showed no acute findings.   ASSESSMENT AND PLAN:  1.  Single seizure episode, query etiology. Possibly idiopathic, however,      may be secondary to acute alcohol intoxication against a background of      positive urine drug screen for cocaine.  Also, one has to consider the      possibility of alcohol  withdrawal, although this is unlikely in a      patient who has just been drinking alcohol.  Be that as it may, head CT      scan is negative.  We shall admit the patient for observation and      arrange EEG.   1.  History of alcohol abuse.  We will shall watch for withdrawal phenomena,      and if this occurs, we will utilize Ativan withdrawal protocol.      Meanwhile, we will need institute vitamin supplementation and counsel      appropriately.   1.  Asthma:  This is currently clinically stable.  We will place the patient      on bronchodilator inhaler and utilize as-needed nebulizers if indicated.   1.  Smoking history:  We will counsel appropriately, and utilize Nicoderm CQ      patch.   Note:  The patient denies cocaine use, however, urine drug screen was  positive for cocaine.  She may require counseling.  Further management will  depend on clinical course.      Isidor Holts, M.D.  Electronically Signed     CO/MEDQ  D:  11/05/2005  T:  11/05/2005  Job:  [id number]

## 2010-12-15 NOTE — Procedures (Signed)
EEG NUMBER:  [id number].   HISTORY:  The patient is a 41 year old with possible seizures vs. syncope.  Study is  being done to look for presence of seizure disorder.  The patient  has had abuse of alcohol.   PROCEDURE:  The tracing is carried out on a 32 channel digital [employer]well  recorder reformatted into 16 channel montages with one devoted to EKG.  Medications include Ativan, albuterol multivitamins, Protonix, nicotine and  thiamine.  The [employer] 10/20 system lead placement was used.   DESCRIPTION OF FINDINGS:  Dominant frequency when the patient is awake is a  9 Hz 15 microvolt activity that is well regulated.  The majority the record,  the patient was asleep with 3 Hz delta range activity of 40 microvolts,  vertex sharp waves and well-defined sleep spindles.   Activating procedures were not carried out.  EKG showed regular sinus rhythm  with ventricular response of 72 beats per minute.   IMPRESSION:  Normal record awake and asleep.      Deanna Artis. Sharene Skeans, M.D.  Electronically Signed     ZOX:WRUE  D:  11/05/2005 18:12:44  T:  11/06/2005 10:13:52  Job #:  [id number]   cc:   Isidor Holts, M.D.

## 2011-04-23 LAB — CBC
HCT: 34.5 — ABNORMAL LOW
MCV: 92.3
Platelets: 218
RDW: 14.3

## 2011-04-23 LAB — URINALYSIS, ROUTINE W REFLEX MICROSCOPIC
Glucose, UA: NEGATIVE
Hgb urine dipstick: NEGATIVE
Specific Gravity, Urine: 1.026
Urobilinogen, UA: 0.2
pH: 7

## 2011-04-23 LAB — DIFFERENTIAL
Lymphocytes Relative: 47 — ABNORMAL HIGH
Lymphs Abs: 1.7
Monocytes Absolute: 0.4
Monocytes Relative: 12
Neutro Abs: 1.2 — ABNORMAL LOW

## 2011-04-23 LAB — COMPREHENSIVE METABOLIC PANEL
Albumin: 3.3 — ABNORMAL LOW
BUN: 6
Creatinine, Ser: 0.74
Potassium: 4.1
Total Protein: 6.6

## 2011-05-26 ENCOUNTER — Emergency Department (HOSPITAL_COMMUNITY)
Admission: EM | Admit: 2011-05-26 | Discharge: 2011-05-26 | Disposition: A | Discharge status: Home / Self Care | Payer: Self-pay | Attending: Emergency Medicine | Admitting: Emergency Medicine

## 2011-05-26 ENCOUNTER — Emergency Department (HOSPITAL_COMMUNITY): Payer: Self-pay

## 2011-05-26 DIAGNOSIS — R05 Cough: Secondary | ICD-10-CM | POA: Insufficient documentation

## 2011-05-26 DIAGNOSIS — R059 Cough, unspecified: Secondary | ICD-10-CM | POA: Insufficient documentation

## 2011-05-26 DIAGNOSIS — J45901 Unspecified asthma with (acute) exacerbation: Secondary | ICD-10-CM | POA: Insufficient documentation

## 2011-08-28 ENCOUNTER — Emergency Department (HOSPITAL_COMMUNITY)
Admission: EM | Admit: 2011-08-28 | Discharge: 2011-08-28 | Disposition: A | Discharge status: Home / Self Care | Payer: Self-pay | Attending: Emergency Medicine | Admitting: Emergency Medicine

## 2011-08-28 ENCOUNTER — Encounter (HOSPITAL_COMMUNITY): Payer: Self-pay | Admitting: *Deleted

## 2011-08-28 DIAGNOSIS — R059 Cough, unspecified: Secondary | ICD-10-CM | POA: Insufficient documentation

## 2011-08-28 DIAGNOSIS — F411 Generalized anxiety disorder: Secondary | ICD-10-CM | POA: Insufficient documentation

## 2011-08-28 DIAGNOSIS — R05 Cough: Secondary | ICD-10-CM | POA: Insufficient documentation

## 2011-08-28 DIAGNOSIS — R0602 Shortness of breath: Secondary | ICD-10-CM | POA: Insufficient documentation

## 2011-08-28 DIAGNOSIS — J45909 Unspecified asthma, uncomplicated: Secondary | ICD-10-CM | POA: Insufficient documentation

## 2011-08-28 DIAGNOSIS — J45901 Unspecified asthma with (acute) exacerbation: Secondary | ICD-10-CM

## 2011-08-28 MED ORDER — PREDNISONE 10 MG PO TABS: 40.0000 mg | ORAL_TABLET | Freq: Every day | ORAL | Status: DC

## 2011-08-28 MED ORDER — ALBUTEROL SULFATE HFA 108 (90 BASE) MCG/ACT IN AERS
2.0000 | INHALATION_SPRAY | RESPIRATORY_TRACT | Status: DC | PRN
  Administered 2011-08-28: 2 via RESPIRATORY_TRACT
  Filled 2011-08-28: qty 6.7

## 2011-08-28 MED ORDER — ALBUTEROL SULFATE (5 MG/ML) 0.5% IN NEBU
10.0000 mg | INHALATION_SOLUTION | Freq: Once | RESPIRATORY_TRACT | Status: AC
  Administered 2011-08-28: 10 mg via RESPIRATORY_TRACT

## 2011-08-28 MED ORDER — ALBUTEROL SULFATE (5 MG/ML) 0.5% IN NEBU
5.0000 mg | INHALATION_SOLUTION | Freq: Once | RESPIRATORY_TRACT | Status: AC
  Administered 2011-08-28: 5 mg via RESPIRATORY_TRACT
  Filled 2011-08-28: qty 1

## 2011-08-28 MED ORDER — IPRATROPIUM BROMIDE 0.02 % IN SOLN
0.5000 mg | Freq: Once | RESPIRATORY_TRACT | Status: AC
  Administered 2011-08-28: 0.5 mg via RESPIRATORY_TRACT
  Filled 2011-08-28: qty 2.5

## 2011-08-28 MED ORDER — METHYLPREDNISOLONE SODIUM SUCC 125 MG IJ SOLR
125.0000 mg | Freq: Once | INTRAMUSCULAR | Status: AC
  Administered 2011-08-28: 125 mg via INTRAVENOUS
  Filled 2011-08-28: qty 2

## 2011-08-28 NOTE — ED Notes (Signed)
Pt reports onset of wheezing/cough since yesterday. Pt actively wheezing. Pt used home albuterol without relief. Reports tightness in chest.

## 2011-08-28 NOTE — ED Provider Notes (Signed)
History     CSN: [id number]  Arrival date & time 08/28/11  4696   First MD Initiated Contact with Patient 08/28/11 [phone]      Chief Complaint  Patient presents with  . Asthma  . Cough    (Consider location/radiation/quality/duration/timing/severity/associated sxs/prior treatment) Patient is a 41 y.o. female presenting with shortness of breath. The history is provided by the patient.  Shortness of Breath  The current episode started yesterday. The onset was gradual. The problem occurs continuously. The problem has been gradually worsening. The problem is severe. Relieved by: temporary relief with albuterol. The symptoms are aggravated by activity. Associated symptoms include cough, shortness of breath and wheezing. Pertinent negatives include no chest pain, no fever and no sore throat. Associated symptoms comments: Positive chest tightness, reported to be similar to prior asthma attacks. She was not exposed to toxic fumes. She has not inhaled smoke recently. She has had intermittent steroid use. She has had prior hospitalizations. She has had no prior intubations. Her past medical history is significant for asthma.    Past Medical History  Diagnosis Date  . Asthma     No past surgical history on file.  No family history on file.  History  Substance Use Topics  . Smoking status: Not on file  . Smokeless tobacco: Not on file  . Alcohol Use:      Review of Systems  Constitutional: Negative for fever.  HENT: Negative for sore throat.   Respiratory: Positive for cough, shortness of breath and wheezing.   Cardiovascular: Negative for chest pain.  10 systems reviewed and are negative for acute change except as noted in the HPI.   Allergies  Review of patient's allergies indicates no known allergies.  Home Medications   Current Outpatient Rx  Name Route Sig Dispense Refill  . ACETAMINOPHEN 500 MG PO TABS Oral Take 500 mg by mouth every 6 (six) hours as needed. Fever and  pain    . ALBUTEROL SULFATE HFA 108 (90 BASE) MCG/ACT IN AERS Inhalation Inhale 2 puffs into the lungs every 6 (six) hours as needed. Shortness of breath and wheezing      BP 141/85  Pulse 81  Temp(Src) 98.7 F (37.1 C) (Oral)  Resp 20  SpO2 99%  Physical Exam  Nursing note and vitals reviewed. Constitutional: She is oriented to person, place, and time. She appears well-developed and well-nourished. She appears distressed.  HENT:  Head: Normocephalic and atraumatic.  Right Ear: External ear normal.  Left Ear: External ear normal.  Mouth/Throat: Oropharynx is clear and moist.  Eyes: Conjunctivae are normal. Pupils are equal, round, and reactive to light.  Neck: Normal range of motion. Neck supple.  Cardiovascular: Normal rate, regular rhythm, normal heart sounds and intact distal pulses.   Pulmonary/Chest: Accessory muscle usage present. She is in respiratory distress. She has decreased breath sounds. She has wheezes.  Abdominal: Soft. Bowel sounds are normal. She exhibits no distension. There is no tenderness.  Musculoskeletal: She exhibits no edema and no tenderness.  Neurological: She is alert and oriented to person, place, and time. Coordination normal.  Skin: Skin is warm and dry. No rash noted.  Psychiatric:       anxious    ED Course  Procedures (including critical care time)  Labs Reviewed - No data to display No results found.   1. Asthma attack       MDM  8:45 AM Pt has been seen and evaluated. Acute asthma exacerbation with hx of  same. Neb tx ordered. WIll re-eval.   9:30 AM Pt with significant improvement of wheezing (now with mild expiratory wheezes) and subjective improvement of chest tightness, but feels she would benefit greatly from an hour-long neb tx.  No continued accessory muscle use.  11:03 AM Pt with continued improvement after hour-long neb tx. Slight end-expiratory wheezes in bilateral bases. WIll d/c home with continued prednisone course  and advice to continue with albuterol home tx as necessary.        Elwyn Reach [place], [place] 08/28/11 1118

## 2011-08-29 NOTE — ED Provider Notes (Signed)
Medical screening examination/treatment/procedure(s) were performed by non-physician practitioner and as supervising physician I was immediately available for consultation/collaboration.  Nicholes Stairs, MD 08/29/11 [phone]

## 2011-10-18 ENCOUNTER — Encounter (HOSPITAL_COMMUNITY): Payer: Self-pay

## 2011-10-18 ENCOUNTER — Emergency Department (HOSPITAL_COMMUNITY)
Admission: EM | Admit: 2011-10-18 | Discharge: 2011-10-18 | Disposition: A | Discharge status: Home / Self Care | Payer: Self-pay | Attending: Emergency Medicine | Admitting: Emergency Medicine

## 2011-10-18 DIAGNOSIS — B349 Viral infection, unspecified: Secondary | ICD-10-CM

## 2011-10-18 DIAGNOSIS — R112 Nausea with vomiting, unspecified: Secondary | ICD-10-CM | POA: Insufficient documentation

## 2011-10-18 DIAGNOSIS — B9789 Other viral agents as the cause of diseases classified elsewhere: Secondary | ICD-10-CM | POA: Insufficient documentation

## 2011-10-18 DIAGNOSIS — R062 Wheezing: Secondary | ICD-10-CM

## 2011-10-18 DIAGNOSIS — J45909 Unspecified asthma, uncomplicated: Secondary | ICD-10-CM | POA: Insufficient documentation

## 2011-10-18 LAB — GLUCOSE, CAPILLARY: Glucose-Capillary: 97 mg/dL (ref 70–99)

## 2011-10-18 MED ORDER — PREDNISONE 20 MG PO TABS
40.0000 mg | ORAL_TABLET | Freq: Once | ORAL | Status: AC
  Administered 2011-10-18: 40 mg via ORAL
  Filled 2011-10-18: qty 2

## 2011-10-18 MED ORDER — HYDROCODONE-ACETAMINOPHEN 5-325 MG PO TABS
1.0000 | ORAL_TABLET | Freq: Once | ORAL | Status: AC
  Administered 2011-10-18: 1 via ORAL
  Filled 2011-10-18: qty 1

## 2011-10-18 MED ORDER — PREDNISONE 20 MG PO TABS: 40.0000 mg | ORAL_TABLET | Freq: Once | ORAL | Status: DC

## 2011-10-18 MED ORDER — HYDROCOD POLST-CHLORPHEN POLST 10-8 MG/5ML PO LQCR: 5.0000 mL | Freq: Two times a day (BID) | ORAL | Status: DC | PRN

## 2011-10-18 MED ORDER — ALBUTEROL SULFATE (5 MG/ML) 0.5% IN NEBU
2.5000 mg | INHALATION_SOLUTION | Freq: Once | RESPIRATORY_TRACT | Status: AC
  Administered 2011-10-18: 2.5 mg via RESPIRATORY_TRACT
  Filled 2011-10-18: qty 1

## 2011-10-18 MED ORDER — ALBUTEROL SULFATE (5 MG/ML) 0.5% IN NEBU
5.0000 mg | INHALATION_SOLUTION | Freq: Once | RESPIRATORY_TRACT | Status: AC
  Administered 2011-10-18: 5 mg via RESPIRATORY_TRACT
  Filled 2011-10-18: qty 1

## 2011-10-18 MED ORDER — IPRATROPIUM BROMIDE 0.02 % IN SOLN
0.5000 mg | Freq: Once | RESPIRATORY_TRACT | Status: AC
  Administered 2011-10-18: 0.5 mg via RESPIRATORY_TRACT
  Filled 2011-10-18: qty 2.5

## 2011-10-18 MED ORDER — ALBUTEROL SULFATE HFA 108 (90 BASE) MCG/ACT IN AERS
1.0000 | INHALATION_SPRAY | Freq: Four times a day (QID) | RESPIRATORY_TRACT | Status: DC | PRN
  Administered 2011-10-18: 2 via RESPIRATORY_TRACT
  Administered 2011-10-18: 12 via RESPIRATORY_TRACT
  Filled 2011-10-18 (×3): qty 6.7

## 2011-10-18 MED ORDER — ONDANSETRON 8 MG PO TBDP: 8.0000 mg | ORAL_TABLET | Freq: Two times a day (BID) | ORAL | Status: AC | PRN

## 2011-10-18 MED ORDER — PREDNISONE 20 MG PO TABS: 40.0000 mg | ORAL_TABLET | Freq: Every day | ORAL | Status: AC

## 2011-10-18 MED ORDER — ONDANSETRON 8 MG PO TBDP
8.0000 mg | ORAL_TABLET | Freq: Once | ORAL | Status: AC
  Administered 2011-10-18: 8 mg via ORAL
  Filled 2011-10-18: qty 1

## 2011-10-18 NOTE — ED Notes (Signed)
Patient reports that she began having N/V/D, coughing, body aches, and chills 2 days ago. Patient states her inhaler is not working. Patient has expiratory wheezing

## 2011-10-18 NOTE — ED Provider Notes (Signed)
History     CSN: [id number]  Arrival date & time 10/18/11  0802   First MD Initiated Contact with Patient 10/18/11 [phone]      Chief Complaint  Patient presents with  . Emesis  . Wheezing    (Consider location/radiation/quality/duration/timing/severity/associated sxs/prior treatment) HPI Comments: Patient reports that she began feeling a little under the weather on Sunday. She reports that she had some chills and feeling a little achy. She tried to go to work on Monday and reports that she had several episodes of nausea, vomiting and diarrhea. Over the past 2 days, she has had increasing nasal congestion, coughing and wheezing. She has a significant history of asthma and had been using her inhaler but has run out of the medication. She reports chest tightness and shortness of breath similar to prior asthma attacks. She has never required intubation in the past. She reports tooth coughing and wheezing, patient was not able to sleep at all last night. She is now having some chest pain secondary to all the coughing. She denies any exertional chest pain or shortness of breath in the last week. She endorses significant wheezing at this time. She has been taking some Tylenol for the fevers at home with only transient improvement of her symptoms. She reports vomiting or diarrhea has slowed significantly. He endorses fatigue and feeling generally weak.  Patient is a 41 y.o. female presenting with vomiting and wheezing. The history is provided by the patient.  Emesis  Associated symptoms include chills, diarrhea, a fever and myalgias. Pertinent negatives include no abdominal pain.  Wheezing  Associated symptoms include a fever, shortness of breath and wheezing.    Past Medical History  Diagnosis Date  . Asthma     Past Surgical History  Procedure Date  . Cholecystectomy   . Cesarean section     History reviewed. No pertinent family history.  History  Substance Use Topics  . Smoking  status: Never Smoker   . Smokeless tobacco: Never Used  . Alcohol Use: No    OB History    Grav Para Term Preterm Abortions TAB SAB Ect Mult Living                  Review of Systems  Constitutional: Positive for fever and chills.  HENT: Positive for congestion.   Respiratory: Positive for chest tightness, shortness of breath and wheezing.   Cardiovascular: Negative for palpitations and leg swelling.  Gastrointestinal: Positive for nausea, vomiting and diarrhea. Negative for abdominal pain, blood in stool and anal bleeding.  Musculoskeletal: Positive for myalgias. Negative for back pain.  Skin: Negative for rash.  Neurological: Positive for weakness. Negative for dizziness, light-headedness and numbness.  All other systems reviewed and are negative.    Allergies  Review of patient's allergies indicates no known allergies.  Home Medications   Current Outpatient Rx  Name Route Sig Dispense Refill  . ACETAMINOPHEN 500 MG PO TABS Oral Take 1,000 mg by mouth every 6 (six) hours as needed. Fever and pain    . ALBUTEROL SULFATE HFA 108 (90 BASE) MCG/ACT IN AERS Inhalation Inhale 2 puffs into the lungs every 6 (six) hours as needed. Shortness of breath and wheezing    . ADULT MULTIVITAMIN W/MINERALS CH Oral Take 2 tablets by mouth daily.    Marland Kitchen HYDROCOD POLST-CPM POLST ER 10-8 MG/5ML PO LQCR Oral Take 5 mLs by mouth every 12 (twelve) hours as needed (cough and congestion). 100 mL 0  . ONDANSETRON 8 MG  PO TBDP Oral Take 1 tablet (8 mg total) by mouth every 12 (twelve) hours as needed for nausea. 15 tablet 0  . PREDNISONE 20 MG PO TABS Oral Take 2 tablets (40 mg total) by mouth once. 10 tablet 0    BP 152/78  Pulse 57  Temp(Src) 98.9 F (37.2 C) (Oral)  Resp 24  SpO2 99%  Physical Exam  Nursing note and vitals reviewed. Constitutional: She appears well-developed and well-nourished.  HENT:  Head: Normocephalic and atraumatic.  Right Ear: External ear normal. No drainage.  Tympanic membrane is retracted. Tympanic membrane is not injected and not erythematous.  Left Ear: External ear normal. No drainage. Tympanic membrane is retracted. Tympanic membrane is not injected and not erythematous.  Mouth/Throat: Uvula is midline and oropharynx is clear and moist. Mucous membranes are not dry.  Cardiovascular: Normal rate, regular rhythm and normal heart sounds.   No murmur heard. Pulmonary/Chest: Tachypnea noted. No respiratory distress. She has no decreased breath sounds. She has wheezes. She has no rales.  Abdominal: Soft. Normal appearance and bowel sounds are normal. There is no tenderness. There is no rebound, no guarding, no CVA tenderness, no tenderness at McBurney's point and negative Murphy's sign.  Skin: Skin is warm, dry and intact. No rash noted. She is not diaphoretic. No pallor.    ED Course  Procedures (including critical care time)   Labs Reviewed  GLUCOSE, CAPILLARY   No results found.   1. Wheezing   2. Viral illness     RA sats are 99% and normal.    12:37 PM Pt feels improved, less wheezing, tolerating PO's, can continue to rehydrate at home.  Rx's given.    MDM   Pt with sig wheezing, given oral steroids, inhaler for home, neb treatments here.  Pt' adb is soft, non distended.  Pt did not have influenza vaccine this year.  Pt is nto toxic in appearance, no fever, not hypotensive.  Lungs are equal both sides, sig exp wheeze.  Pt reports she has been thirsty a lot, CBG checked.  Pt seems to have influenza like illness versus N/V/D with associated URI and bronchitis.  Will avoid abx since could make N/V/D worse and pt has no fever here, unlikely to be bacterial infection.          Gavin Pound. Zvi Duplantis, MD 10/18/11 1237

## 2011-11-07 ENCOUNTER — Encounter (HOSPITAL_COMMUNITY): Payer: Self-pay | Admitting: *Deleted

## 2011-11-07 ENCOUNTER — Emergency Department (INDEPENDENT_AMBULATORY_CARE_PROVIDER_SITE_OTHER)
Admission: EM | Admit: 2011-11-07 | Discharge: 2011-11-07 | Disposition: A | Discharge status: Home / Self Care | Payer: Self-pay | Source: Home / Self Care

## 2011-11-07 DIAGNOSIS — J45909 Unspecified asthma, uncomplicated: Secondary | ICD-10-CM

## 2011-11-07 DIAGNOSIS — J45901 Unspecified asthma with (acute) exacerbation: Secondary | ICD-10-CM

## 2011-11-07 DIAGNOSIS — J309 Allergic rhinitis, unspecified: Secondary | ICD-10-CM

## 2011-11-07 HISTORY — DX: Other seasonal allergic rhinitis: J30.2

## 2011-11-07 MED ORDER — PREDNISONE 20 MG PO TABS: 20.0000 mg | ORAL_TABLET | Freq: Two times a day (BID) | ORAL | Status: AC

## 2011-11-07 MED ORDER — IPRATROPIUM BROMIDE 0.02 % IN SOLN
0.5000 mg | Freq: Once | RESPIRATORY_TRACT | Status: AC
  Administered 2011-11-07: 0.5 mg via RESPIRATORY_TRACT

## 2011-11-07 MED ORDER — ALBUTEROL SULFATE (5 MG/ML) 0.5% IN NEBU
5.0000 mg | INHALATION_SOLUTION | Freq: Once | RESPIRATORY_TRACT | Status: AC
  Administered 2011-11-07: 5 mg via RESPIRATORY_TRACT

## 2011-11-07 MED ORDER — LORATADINE 10 MG PO TABS: 10.0000 mg | ORAL_TABLET | Freq: Every day | ORAL | Status: DC

## 2011-11-07 MED ORDER — ALBUTEROL SULFATE HFA 108 (90 BASE) MCG/ACT IN AERS
INHALATION_SPRAY | RESPIRATORY_TRACT | Status: AC
  Filled 2011-11-07: qty 6.7

## 2011-11-07 MED ORDER — ALBUTEROL SULFATE (5 MG/ML) 0.5% IN NEBU
INHALATION_SOLUTION | RESPIRATORY_TRACT | Status: AC
  Filled 2011-11-07: qty 1

## 2011-11-07 MED ORDER — ALBUTEROL SULFATE HFA 108 (90 BASE) MCG/ACT IN AERS
2.0000 | INHALATION_SPRAY | RESPIRATORY_TRACT | Status: DC | PRN
  Administered 2011-11-07: 2 via RESPIRATORY_TRACT

## 2011-11-07 NOTE — ED Notes (Signed)
Pt with history asthma increased sob /cough/congestion onset yesterday - pt with audible wheezing on arrival to [facility] - per pt ran out of albuterol inhaler

## 2011-11-07 NOTE — ED Notes (Signed)
Pt feeling better post breathing treatment resp less labored

## 2011-11-07 NOTE — ED Provider Notes (Signed)
History     CSN: [id number]  Arrival date & time 11/07/11  1045   None     Chief Complaint  Patient presents with  . Asthma    (Consider location/radiation/quality/duration/timing/severity/associated sxs/prior treatment) HPI Comments: Patient presents with dyspnea and wheezing, onset yesterday with worsening since early this morning. She has a history of seasonal allergies and asthma. She has had nasal congestion and sneezing the last few days - typical of her allergy symptoms. Yesterday she began having dyspnea and wheezing. She was using her albuterol inhaler but ran out last night. Pt was seen in the ED 10/18/11 for her asthma and treated with oral steroids. She states symptoms had improved to her baseline. She admits to using albuterol inhaler at least once a day every day, and up to 4 times a day "on bad days." She awakens early almost every morning with wheezing. She is a nonsmoker. Smoke, perfumes, and strong odors also trigger her asthma attacks. She recently obtained her orange card and has an appt with [facility] on 12/07/11 to establish care with a PCP.    Past Medical History  Diagnosis Date  . Asthma   . Seasonal allergies     Past Surgical History  Procedure Date  . Cholecystectomy   . Cesarean section     History reviewed. No pertinent family history.  History  Substance Use Topics  . Smoking status: Never Smoker   . Smokeless tobacco: Never Used  . Alcohol Use: No    OB History    Grav Para Term Preterm Abortions TAB SAB Ect Mult Living                  Review of Systems  Constitutional: Negative for fever, chills and fatigue.  HENT: Positive for congestion, rhinorrhea and sneezing. Negative for ear pain, sore throat, postnasal drip and sinus pressure.   Respiratory: Positive for cough and wheezing.   Cardiovascular: Negative for palpitations.    Allergies  Review of patient's allergies indicates no known allergies.  Home Medications   Current  Outpatient Rx  Name Route Sig Dispense Refill  . ACETAMINOPHEN 500 MG PO TABS Oral Take 1,000 mg by mouth every 6 (six) hours as needed. Fever and pain    . ALBUTEROL SULFATE HFA 108 (90 BASE) MCG/ACT IN AERS Inhalation Inhale 2 puffs into the lungs every 6 (six) hours as needed. Shortness of breath and wheezing    . LORATADINE 10 MG PO TABS Oral Take 1 tablet (10 mg total) by mouth daily. 30 tablet 0  . ADULT MULTIVITAMIN W/MINERALS CH Oral Take 2 tablets by mouth daily.    Marland Kitchen PREDNISONE 20 MG PO TABS Oral Take 1 tablet (20 mg total) by mouth 2 (two) times daily. 10 tablet 0    BP 126/86  Pulse 86  Temp(Src) 98.5 F (36.9 C) (Oral)  Resp 18  SpO2 96%  Physical Exam  Nursing note and vitals reviewed. Constitutional: She appears well-developed and well-nourished. She appears distressed.  HENT:  Head: Normocephalic and atraumatic.  Right Ear: Tympanic membrane, external ear and ear canal normal.  Left Ear: Tympanic membrane, external ear and ear canal normal.  Nose: Nose normal.  Mouth/Throat: Uvula is midline, oropharynx is clear and moist and mucous membranes are normal. No oropharyngeal exudate, posterior oropharyngeal edema or posterior oropharyngeal erythema.  Neck: Neck supple.  Cardiovascular: Normal rate, regular rhythm and normal heart sounds.   Pulmonary/Chest: She is in respiratory distress. She has decreased breath sounds.  She has wheezes.  Lymphadenopathy:    She has no cervical adenopathy.  Neurological: She is alert.  Skin: Skin is warm and dry.  Psychiatric: She has a normal mood and affect.    ED Course  Procedures (including critical care time)  Labs Reviewed - No data to display No results found.   1. Asthma attack   2. Allergic rhinitis       MDM  Symptomatic improvement after nebulizer treatment. Conversational dyspnea resolved, and lungs CTA. Discussed with pt that she needs better asthma control and needs to be prescribed inhaled steroids. She is  unable to afford this prescription today if I prescribe this for her, so she will address this with her PCP at [facility].          Melody Com[place], [place] 11/07/11 1149

## 2011-11-07 NOTE — Discharge Instructions (Signed)
Begin taking Loratadine for allergies. Use albuterol inhaler as needed. Begin oral prednisone today. Keep your appt with [facility] on May 10th. Discuss with your health care provider your ER visits and daily use of albuterol inhaler. You should be on a controller inhaler. Return if symptoms worsen.    Allergic Rhinitis Allergic rhinitis is when the mucous membranes in the nose respond to allergens. Allergens are particles in the air that cause your body to have an allergic reaction. This causes you to release allergic antibodies. Through a chain of events, these eventually cause you to release histamine into the blood stream (hence the use of antihistamines). Although meant to be protective to the body, it is this release that causes your discomfort, such as frequent sneezing, congestion and an itchy runny nose.  CAUSES  The pollen allergens may come from grasses, trees, and weeds. This is seasonal allergic rhinitis, or "hay fever." Other allergens cause year-round allergic rhinitis (perennial allergic rhinitis) such as house dust mite allergen, pet dander and mold spores.  SYMPTOMS   Nasal stuffiness (congestion).   Runny, itchy nose with sneezing and tearing of the eyes.   There is often an itching of the mouth, eyes and ears.  It cannot be cured, but it can be controlled with medications. DIAGNOSIS  If you are unable to determine the offending allergen, skin or blood testing may find it. TREATMENT   Avoid the allergen.   Medications and allergy shots (immunotherapy) can help.   Hay fever may often be treated with antihistamines in pill or nasal spray forms. Antihistamines block the effects of histamine. There are over-the-counter medicines that may help with nasal congestion and swelling around the eyes. Check with your caregiver before taking or giving this medicine.  If the treatment above does not work, there are many new medications your caregiver can prescribe. Stronger  medications may be used if initial measures are ineffective. Desensitizing injections can be used if medications and avoidance fails. Desensitization is when a patient is given ongoing shots until the body becomes less sensitive to the allergen. Make sure you follow up with your caregiver if problems continue. SEEK MEDICAL CARE IF:   You develop fever (more than 100.5 F (38.1 C).   You develop a cough that does not stop easily (persistent).   You have shortness of breath.   You start wheezing.   Symptoms interfere with normal daily activities.  Document Released: 04/10/2001 Document Revised: 07/05/2011 Document Reviewed: 10/20/2008 [facility] Patient Information 2012 [place], [name]. Asthma, Adult Asthma is caused by narrowing of the air passages in the lungs. It may be triggered by pollen, dust, animal dander, molds, some foods, respiratory infections, exposure to smoke, exercise, emotional stress or other allergens (things that cause allergic reactions or allergies). Repeat attacks are common. HOME CARE INSTRUCTIONS   Use prescription medications as ordered by your caregiver.   Avoid pollen, dust, animal dander, molds, smoke and other things that cause attacks at home and at work.   You may have fewer attacks if you decrease dust in your home. Electrostatic air cleaners may help.   It may help to replace your pillows or mattress with materials less likely to cause allergies.   Talk to your caregiver about an action plan for managing asthma attacks at home, including, the use of a peak flow meter which measures the severity of your asthma attack. An action plan can help minimize or stop the attack without having to seek medical care.   If  you are not on a fluid restriction, drink 8 to 10 glasses of water each day.   Always have a plan prepared for seeking medical attention, including, calling your physician, accessing local emergency care, and calling 911 (in the [place] for a severe  attack.   Discuss possible exercise routines with your caregiver.   If animal dander is the cause of asthma, you may need to get rid of pets.  SEEK MEDICAL CARE IF:   You have wheezing and shortness of breath even if taking medicine to prevent attacks.   You have muscle aches, chest pain or thickening of sputum.   Your sputum changes from clear or white to yellow, green, gray, or bloody.   You have any problems that may be related to the medicine you are taking (such as a rash, itching, swelling or trouble breathing).  SEEK IMMEDIATE MEDICAL CARE IF:   Your usual medicines do not stop your wheezing or there is increased coughing and/or shortness of breath.   You have increased difficulty breathing.   You have a fever.  MAKE SURE YOU:   Understand these instructions.   Will watch your condition.   Will get help right away if you are not doing well or get worse.  Document Released: 07/16/2005 Document Revised: 07/05/2011 Document Reviewed: 03/03/2008 [facility] Patient Information 2012 [place], [name].

## 2011-11-15 NOTE — ED Provider Notes (Signed)
Medical screening examination/treatment/procedure(s) were performed by non-physician practitioner and as supervising physician I was immediately available for consultation/collaboration.  Wendy James,Wendy James   [name], MD 11/15/11 2135 

## 2011-12-03 ENCOUNTER — Emergency Department (HOSPITAL_COMMUNITY)
Admission: EM | Admit: 2011-12-03 | Discharge: 2011-12-03 | Disposition: A | Discharge status: Home / Self Care | Payer: Self-pay | Attending: Emergency Medicine | Admitting: Emergency Medicine

## 2011-12-03 ENCOUNTER — Emergency Department (HOSPITAL_COMMUNITY): Payer: Self-pay

## 2011-12-03 ENCOUNTER — Encounter (HOSPITAL_COMMUNITY): Payer: Self-pay | Admitting: *Deleted

## 2011-12-03 DIAGNOSIS — R059 Cough, unspecified: Secondary | ICD-10-CM | POA: Insufficient documentation

## 2011-12-03 DIAGNOSIS — R05 Cough: Secondary | ICD-10-CM | POA: Insufficient documentation

## 2011-12-03 DIAGNOSIS — R070 Pain in throat: Secondary | ICD-10-CM | POA: Insufficient documentation

## 2011-12-03 DIAGNOSIS — J45901 Unspecified asthma with (acute) exacerbation: Secondary | ICD-10-CM | POA: Insufficient documentation

## 2011-12-03 DIAGNOSIS — R0602 Shortness of breath: Secondary | ICD-10-CM | POA: Insufficient documentation

## 2011-12-03 DIAGNOSIS — R0682 Tachypnea, not elsewhere classified: Secondary | ICD-10-CM | POA: Insufficient documentation

## 2011-12-03 DIAGNOSIS — Z79899 Other long term (current) drug therapy: Secondary | ICD-10-CM | POA: Insufficient documentation

## 2011-12-03 DIAGNOSIS — R0789 Other chest pain: Secondary | ICD-10-CM | POA: Insufficient documentation

## 2011-12-03 MED ORDER — ALBUTEROL SULFATE HFA 108 (90 BASE) MCG/ACT IN AERS
2.0000 | INHALATION_SPRAY | Freq: Four times a day (QID) | RESPIRATORY_TRACT | Status: DC
  Administered 2011-12-03: 2 via RESPIRATORY_TRACT
  Filled 2011-12-03 (×2): qty 6.7

## 2011-12-03 MED ORDER — ALBUTEROL SULFATE (5 MG/ML) 0.5% IN NEBU: 2.5000 mg | INHALATION_SOLUTION | Freq: Four times a day (QID) | RESPIRATORY_TRACT | Status: DC | PRN

## 2011-12-03 MED ORDER — ALBUTEROL SULFATE (5 MG/ML) 0.5% IN NEBU
5.0000 mg | INHALATION_SOLUTION | Freq: Once | RESPIRATORY_TRACT | Status: AC
  Administered 2011-12-03: 5 mg via RESPIRATORY_TRACT
  Filled 2011-12-03: qty 1

## 2011-12-03 MED ORDER — METHYLPREDNISOLONE SODIUM SUCC 125 MG IJ SOLR
125.0000 mg | Freq: Once | INTRAMUSCULAR | Status: AC
  Administered 2011-12-03: 125 mg via INTRAVENOUS
  Filled 2011-12-03: qty 2

## 2011-12-03 MED ORDER — ALBUTEROL SULFATE (5 MG/ML) 0.5% IN NEBU
5.0000 mg | INHALATION_SOLUTION | Freq: Once | RESPIRATORY_TRACT | Status: AC
  Administered 2011-12-03: 5 mg via RESPIRATORY_TRACT

## 2011-12-03 MED ORDER — ALBUTEROL SULFATE (5 MG/ML) 0.5% IN NEBU
INHALATION_SOLUTION | RESPIRATORY_TRACT | Status: AC
  Filled 2011-12-03: qty 2

## 2011-12-03 MED ORDER — ALBUTEROL SULFATE (2.5 MG/3ML) 0.083% IN NEBU: 2.5000 mg | INHALATION_SOLUTION | Freq: Four times a day (QID) | RESPIRATORY_TRACT | Status: DC | PRN

## 2011-12-03 MED ORDER — ALBUTEROL (5 MG/ML) CONTINUOUS INHALATION SOLN
10.0000 mg/h | INHALATION_SOLUTION | RESPIRATORY_TRACT | Status: AC
  Administered 2011-12-03: 10 mg/h via RESPIRATORY_TRACT

## 2011-12-03 MED ORDER — MORPHINE SULFATE 4 MG/ML IJ SOLN
4.0000 mg | Freq: Once | INTRAMUSCULAR | Status: AC
  Administered 2011-12-03: 4 mg via INTRAVENOUS
  Filled 2011-12-03: qty 1

## 2011-12-03 MED ORDER — IPRATROPIUM BROMIDE 0.02 % IN SOLN
0.5000 mg | Freq: Once | RESPIRATORY_TRACT | Status: AC
  Administered 2011-12-03: 0.5 mg via RESPIRATORY_TRACT
  Filled 2011-12-03: qty 2.5

## 2011-12-03 MED ORDER — ONDANSETRON HCL 4 MG/2ML IJ SOLN
4.0000 mg | Freq: Once | INTRAMUSCULAR | Status: AC
  Administered 2011-12-03: 4 mg via INTRAVENOUS
  Filled 2011-12-03: qty 2

## 2011-12-03 MED ORDER — IPRATROPIUM BROMIDE 0.02 % IN SOLN
0.5000 mg | Freq: Once | RESPIRATORY_TRACT | Status: AC
  Administered 2011-12-03: 0.5 mg via RESPIRATORY_TRACT

## 2011-12-03 MED ORDER — PREDNISONE 50 MG PO TABS: 50.0000 mg | ORAL_TABLET | Freq: Every day | ORAL | Status: DC

## 2011-12-03 NOTE — ED Notes (Signed)
RN called pharmacy, tech states waiting for pharmacist to check Rx.

## 2011-12-03 NOTE — ED Notes (Signed)
Awaiting prednisone from pharmacy.

## 2011-12-03 NOTE — ED Notes (Signed)
Rad tech came to take pt for DG Chest. Pt is still on continuous neb. Rad tech will return approx 9:10 to take pt.

## 2011-12-03 NOTE — ED Notes (Signed)
Pt returned from xray. O2 and pulse ox on, bed locked, cardiac monitor reconnected.

## 2011-12-03 NOTE — ED Notes (Signed)
Patient transported to X-ray 

## 2011-12-03 NOTE — ED Provider Notes (Signed)
Medical screening examination/treatment/procedure(s) were conducted as a shared visit with non-physician practitioner(s) and myself.  I personally evaluated the patient during the encounter  This 41 year old female has a history of asthma without smoking history at all, she was last hospitalized a couple years ago and has not been intubated, she is been on prednisone for the last few days, her coughing and wheezing is worse with shortness breath the last couple of days, she is no fever or confusion. On examination she has diffuse expiratory wheezes and is able to speak short sentences with mild to moderate respiratory distress in the ED.  Hurman Horn, MD 12/03/11 [phone]

## 2011-12-03 NOTE — ED Notes (Signed)
Pt was seen at dr. Isidore Moos Friday a schedule appointment related to her asthma. Pt was put on prednisone and multiple inhalers. Pt states she woke up about 4:30 to go to work, pt was short of breath, pt used her inhalers mulitple times since waking up. Pt has auditory wheezing, labored breathing, tachypnec, congested cough, pt denies smoking. Pt states she is also taking prednisone.

## 2011-12-03 NOTE — Progress Notes (Signed)
Marchelle Folks in [place] pharmacy confirms pt is eligible for [employer]

## 2011-12-03 NOTE — ED Notes (Signed)
Pt awaiting medications from pharmacy prior to dc.

## 2011-12-03 NOTE — Progress Notes (Signed)
Tubed Rx for albuterol neb and prednisone to [facility] pharmacy for pt Cm reviewed [place] resources with pt for local self pay pcps, health dept, DSS, general medical, [facility], [facility], and [email or web address]. Pt has orange card for [facility] and sees Dr Andrey Campanile.  Familiar with [facility] (pt will use as alternative pcp)

## 2011-12-03 NOTE — ED Notes (Signed)
Notified Respiratory Therapy for Albuterol continuous neb tx.

## 2011-12-03 NOTE — Discharge Instructions (Signed)
Read the information.  Follow up with your primary care provider.  Use the medications as prescribed.  Do not take both prescriptions of prednisone at once - take only one or the other.  If you have worsening shortness of breath or wheezing, not improvement with your home nebulizer treatments and steroids (prednisone), return to the ER for a recheck.  You may return to the ER at any time for worsening condition or any new symptoms that concern you.  Asthma, Adult Asthma is a disease of the lungs and can make it hard to breathe. Asthma cannot be cured, but medicine can help control it. Asthma may be started (triggered) by:  Pollen.   Dust.   Animal skin flakes (dander).   Molds.   Foods.   Respiratory infections (colds, flu).   Smoke.   Exercise.   Stress.   Other things that cause allergic reactions or allergies (allergens).  HOME CARE   Talk to your doctor about how to manage your attacks at home. This may include:   Using a tool called a peak flow meter.   Having medicine ready to stop the attack.   Take all medicine as told by your doctor.   Wash bed sheets and blankets every week in hot water and put them in the dryer.   Drink enough fluids to keep your pee (urine) clear or pale yellow.   Always be ready to get emergency help. Write down the phone number for your doctor. Keep it where you can easily find it.   Talk about exercise routines with your doctor.   If animal dander is causing your asthma, you may need to find a new home for your pet(s).  GET HELP RIGHT AWAY IF:   You have muscle aches.   You cough more.   You have chest pain.   You have thick spit (sputum) that changes to yellow, green, gray, or bloody.   Medicine does not stop your wheezing.   You have problems breathing.   You have a fever.   Your medicine causes:   A rash.   Itching.   Puffiness (swelling).   Breathing problems.  MAKE SURE YOU:   Understand these instructions.    Will watch your condition.   Will get help right away if you are not doing well or get worse.  Document Released: 01/02/2008 Document Revised: 07/05/2011 Document Reviewed: 05/26/2008 [facility] Patient Information 2012 [place], [name].   Asthma Prevention Cigarette smoke, house dust, molds, pollens, animal dander, certain insects, exercise, and even cold air are all triggers that can cause an asthma attack. Often, no specific triggers are identified.  Take the following measures around your house to reduce attacks:  Avoid cigarette and other smoke. No smoking should be allowed in a home where someone with asthma lives. If smoking is allowed indoors, it should be done in a room with a closed door, and a window should be opened to clear the air. If possible, do not use a wood-burning stove, kerosene heater, or fireplace. Minimize exposure to all sources of smoke, including incense, candles, fires, and fireworks.   Decrease pollen exposure. Keep your windows shut and use central air during the pollen allergy season. Stay indoors with windows closed from late morning to afternoon, if you can. Avoid mowing the lawn if you have grass pollen allergy. Change your clothes and shower after being outside during this time of year.   Remove molds from bathrooms and wet areas. Do this by cleaning  the floors with a fungicide or diluted bleach. Avoid using humidifiers, vaporizers, or swamp coolers. These can spread molds through the air. Fix leaky faucets, pipes, or other sources of water that have mold around them.   Decrease house dust exposure. Do this by using bare floors, vacuuming frequently, and changing furnace and air cooler filters frequently. Avoid using feather, wool, or foam bedding. Use polyester pillows and plastic covers over your mattress. Wash bedding weekly in hot water (hotter than 130 F).   Try to get someone else to vacuum for you once or twice a week, if you can. Stay out of rooms while  they are being vacuumed and for a short while afterward. If you vacuum, use a dust mask (from a hardware store), a double-layered or microfilter vacuum cleaner bag, or a vacuum cleaner with a HEPA filter.   Avoid perfumes, talcum powder, hair spray, paints and other strong odors and fumes.   Keep warm-blooded pets (cats, dogs, rodents, birds) outside the home if they are triggers for asthma. If you can't keep the pet outdoors, keep the pet out of your bedroom and other sleeping areas at all times, and keep the door closed. Remove carpets and furniture covered with cloth from your home. If that is not possible, keep the pet away from fabric-covered furniture and carpets.   Eliminate cockroaches. Keep food and garbage in closed containers. Never leave food out. Use poison baits, traps, powders, gels, or paste (for example, boric acid). If a spray is used to kill cockroaches, stay out of the room until the odor goes away.   Decrease indoor humidity to less than 60%. Use an indoor air cleaning device.   Avoid sulfites in foods and beverages. Do not drink beer or wine or eat dried fruit, processed potatoes, or shrimp if they cause asthma symptoms.   Avoid cold air. Cover your nose and mouth with a scarf on cold or windy days.   Avoid aspirin. This is the most common drug causing serious asthma attacks.   If exercise triggers your asthma, ask your caregiver how you should prepare before exercising. (For example, ask if you could use your inhaler 10 minutes before exercising.)   Avoid close contact with people who have a cold or the flu since your asthma symptoms may get worse if you catch the infection from them. Wash your hands thoroughly after touching items that may have been handled by others with a respiratory infection.   Get a flu shot every year to protect against the flu virus, which often makes asthma worse for days to weeks. Also get a pneumonia shot once every five to 10 years.  Call your  caregiver if you want further information about measures you can take to help prevent asthma attacks. Document Released: 07/16/2005 Document Revised: 07/05/2011 Document Reviewed: 05/24/2009 [facility] Patient Information 2012 [place], [name].

## 2011-12-03 NOTE — ED Provider Notes (Signed)
History     CSN: [id number]  Arrival date & time 12/03/11  1478   First MD Initiated Contact with Patient 12/03/11 [phone]      Chief Complaint  Patient presents with  . Asthma    (Consider location/radiation/quality/duration/timing/severity/associated sxs/prior treatment) HPI Comments: Patient with hx asthma reports she woke up yesterday with increased SOB, coughing, and wheezing, that feels like her typical asthma exacerbation.  Reports associated sore throat from coughing.  Cough is nonproductive.  Pt denies fevers, chills.  Denies any recent exposures to any triggers including smoke, bleach, allergens.  States she is taking her inhalers, prednisone, and claritin.  States she has a nebulizer machine at home but her primary care provider Dr Andrey Campanile at [facility] did not prescribe any nebulizer medications for home.  She has never required intubation for her asthma.    Patient is a 41 y.o. female presenting with asthma. The history is provided by the patient.  Asthma Associated symptoms include coughing and a sore throat. Pertinent negatives include no abdominal pain, chills, fever, nausea or vomiting.    Past Medical History  Diagnosis Date  . Asthma   . Seasonal allergies     Past Surgical History  Procedure Date  . Cholecystectomy   . Cesarean section     No family history on file.  History  Substance Use Topics  . Smoking status: Never Smoker   . Smokeless tobacco: Never Used  . Alcohol Use: No    OB History    Grav Para Term Preterm Abortions TAB SAB Ect Mult Living                  Review of Systems  Constitutional: Negative for fever and chills.  HENT: Positive for sore throat.   Respiratory: Positive for cough, chest tightness and shortness of breath.   Gastrointestinal: Negative for nausea, vomiting, abdominal pain and diarrhea.    Allergies  Review of patient's allergies indicates no known allergies.  Home Medications   Current Outpatient Rx  Name  Route Sig Dispense Refill  . ACETAMINOPHEN 500 MG PO TABS Oral Take 1,000 mg by mouth every 6 (six) hours as needed. Fever and pain    . ALBUTEROL SULFATE HFA 108 (90 BASE) MCG/ACT IN AERS Inhalation Inhale 2 puffs into the lungs every 6 (six) hours as needed. Shortness of breath and wheezing    . LORATADINE 10 MG PO TABS Oral Take 1 tablet (10 mg total) by mouth daily. 30 tablet 0  . ADULT MULTIVITAMIN W/MINERALS CH Oral Take 2 tablets by mouth daily.      BP 156/99  Pulse 84  Temp(Src) 98.2 F (36.8 C) (Oral)  Resp 26  SpO2 94%  Physical Exam  Nursing note and vitals reviewed. Constitutional: She is oriented to person, place, and time. She appears well-developed and well-nourished.  HENT:  Head: Normocephalic and atraumatic.  Neck: Neck supple.  Cardiovascular: Normal rate and regular rhythm.   Pulmonary/Chest: Accessory muscle usage present. Tachypnea noted. She has decreased breath sounds. She has wheezes. She has no rhonchi. She has no rales.  Neurological: She is alert and oriented to person, place, and time. She exhibits normal muscle tone.  Psychiatric: She has a normal mood and affect. Her behavior is normal.    ED Course  Procedures (including critical care time)  Labs Reviewed - No data to display Dg Chest 2 View  12/03/2011  *RADIOLOGY REPORT*  Clinical Data: Asthma  CHEST - 2 VIEW  Comparison:  Chest radiograph 05/26/2011  Findings: Normal mediastinum and heart silhouette.  Costophrenic angles are clear.  No effusion, infiltrate, or pneumothorax.  Mild coarsened central bronchovascular markings.  IMPRESSION:  Coarsened central bronchovascular markings are stable.  No acute findings.  Original Report Authenticated By: Genevive Bi, M.D.    7:15 AM Patient showing some improvement following first nebulizer treatment.  On exam, she is moving more air throughout, continued expiratory wheezing and increased work of breathing.    8:19 AM Discussed with Dr Fonnie Jarvis  12:10  PM Patient reports improvement following third neb treatment.  Pt has been sleeping soundly with oxygen flowing.  I have stopped oxygen.  Pt would like to attempt to ambulate prior to d/c home.  Pt will also need help with medications as she has no money to fill them currently.  Will need nebulized albuterol for home.  Will ambulate with pulsox and recheck.    12:20 PM Spoke with Selena Batten, case manager, who will check for eligibility for indigent fund.   2:07 PM Patient reports she did well ambulating and is ready for d/c home.  Declines further treatments here.  Will obtain medication for home nebulizer as well as prednisone prescription (unsure of current dosage, have discussed with patient that we will replace last prescription) using indigent fund from pharmacy   1. Asthma exacerbation       MDM  Patient with hx asthma presents with typical asthma exacerbation.  Initially patient was not moving air well and had increased work of breathing, expiratory wheezing, improved with neb treatments and solu-medrol.  Pt with lungs CTAB upon discharge, ambulating easily and O2 sats normal.  CXR negative.  Pt d/c home with nebulized albuterol, prednisone, albuterol HFA, PCP follow up, return precautions given.  Patient verbalizes understanding and agrees with plan.          Rise Patience, [place] 12/03/11 1510

## 2011-12-17 ENCOUNTER — Emergency Department (HOSPITAL_COMMUNITY): Payer: Self-pay

## 2011-12-17 ENCOUNTER — Encounter (HOSPITAL_COMMUNITY): Payer: Self-pay | Admitting: Emergency Medicine

## 2011-12-17 ENCOUNTER — Emergency Department (HOSPITAL_COMMUNITY)
Admission: EM | Admit: 2011-12-17 | Discharge: 2011-12-17 | Disposition: A | Discharge status: Home / Self Care | Payer: Self-pay | Attending: Emergency Medicine | Admitting: Emergency Medicine

## 2011-12-17 DIAGNOSIS — L03317 Cellulitis of buttock: Secondary | ICD-10-CM | POA: Insufficient documentation

## 2011-12-17 DIAGNOSIS — J45909 Unspecified asthma, uncomplicated: Secondary | ICD-10-CM | POA: Insufficient documentation

## 2011-12-17 DIAGNOSIS — M549 Dorsalgia, unspecified: Secondary | ICD-10-CM | POA: Insufficient documentation

## 2011-12-17 DIAGNOSIS — M79609 Pain in unspecified limb: Secondary | ICD-10-CM | POA: Insufficient documentation

## 2011-12-17 DIAGNOSIS — Z9109 Other allergy status, other than to drugs and biological substances: Secondary | ICD-10-CM | POA: Insufficient documentation

## 2011-12-17 DIAGNOSIS — L0231 Cutaneous abscess of buttock: Secondary | ICD-10-CM | POA: Insufficient documentation

## 2011-12-17 DIAGNOSIS — Z79899 Other long term (current) drug therapy: Secondary | ICD-10-CM | POA: Insufficient documentation

## 2011-12-17 LAB — DIFFERENTIAL
Basophils Absolute: 0 10*3/uL (ref 0.0–0.1)
Basophils Relative: 0 % (ref 0–1)
Eosinophils Absolute: 0.1 10*3/uL (ref 0.0–0.7)
Eosinophils Relative: 2 % (ref 0–5)

## 2011-12-17 LAB — URINALYSIS, ROUTINE W REFLEX MICROSCOPIC
Bilirubin Urine: NEGATIVE
Hgb urine dipstick: NEGATIVE
Nitrite: NEGATIVE
Protein, ur: NEGATIVE mg/dL
Urobilinogen, UA: 1 mg/dL (ref 0.0–1.0)

## 2011-12-17 LAB — POCT I-STAT, CHEM 8
Creatinine, Ser: 0.7 mg/dL (ref 0.50–1.10)
HCT: 34 % — ABNORMAL LOW (ref 36.0–46.0)
Hemoglobin: 11.6 g/dL — ABNORMAL LOW (ref 12.0–15.0)
Sodium: 138 mEq/L (ref 135–145)
TCO2: 25 mmol/L (ref 0–100)

## 2011-12-17 LAB — CBC
MCH: 29.1 pg (ref 26.0–34.0)
MCHC: 32.7 g/dL (ref 30.0–36.0)
MCV: 88.9 fL (ref 78.0–100.0)
Platelets: 199 10*3/uL (ref 150–400)
RDW: 15.3 % (ref 11.5–15.5)

## 2011-12-17 LAB — URINE MICROSCOPIC-ADD ON

## 2011-12-17 MED ORDER — ONDANSETRON HCL 4 MG PO TABS: 4.0000 mg | ORAL_TABLET | Freq: Four times a day (QID) | ORAL | Status: DC

## 2011-12-17 MED ORDER — OXYCODONE-ACETAMINOPHEN 5-325 MG PO TABS
2.0000 | ORAL_TABLET | Freq: Once | ORAL | Status: AC
  Administered 2011-12-17: 2 via ORAL
  Filled 2011-12-17: qty 2

## 2011-12-17 MED ORDER — LIDOCAINE HCL 1 % IJ SOLN
INTRAMUSCULAR | Status: AC
  Administered 2011-12-17: 1.5 mL via INTRAMUSCULAR
  Filled 2011-12-17: qty 20

## 2011-12-17 MED ORDER — CEFTRIAXONE SODIUM 250 MG IJ SOLR
250.0000 mg | INTRAMUSCULAR | Status: DC
  Administered 2011-12-17: 250 mg via INTRAMUSCULAR
  Filled 2011-12-17: qty 250

## 2011-12-17 MED ORDER — OXYCODONE-ACETAMINOPHEN 5-325 MG PO TABS: 1.0000 | ORAL_TABLET | ORAL | Status: AC | PRN

## 2011-12-17 MED ORDER — NITROFURANTOIN MONOHYD MACRO 100 MG PO CAPS: 100.0000 mg | ORAL_CAPSULE | Freq: Two times a day (BID) | ORAL | Status: DC

## 2011-12-17 NOTE — ED Notes (Signed)
Pt reports 22 hx of low back pain,radiating to l/leg

## 2011-12-17 NOTE — Progress Notes (Signed)
pt confirms Wendy James of [facility] as pcp  

## 2011-12-17 NOTE — Discharge Instructions (Signed)
Wendy James you have a UTI. Start the antibiotics today.  Take ibuprofen 800mg  for pain.  Take the percocet pain medication for severe pain but do not drive with this.    Drink plenty of fluids.  Return to the ER for severe pain or uncontrolled nausea and vomiting with fever.  No kidney stone today.  Follow up at [facility] this week.      Back Pain, Adult Back pain is very common. The pain often gets better over time. The cause of back pain is usually not dangerous. Most people can learn to manage their back pain on their own.  HOME CARE   Stay active. Start with short walks on flat ground if you can. Try to walk farther each day.   Do not sit, drive, or stand in one place for more than 30 minutes. Do not stay in bed.   Do not avoid exercise or work. Activity can help your back heal faster.   Be careful when you bend or lift an object. Bend at your knees, keep the object close to you, and do not twist.   Sleep on a firm mattress. Lie on your side, and bend your knees. If you lie on your back, put a pillow under your knees.   Only take medicines as told by your doctor.   Put ice on the injured area.   Put ice in a plastic bag.   Place a towel between your skin and the bag.   Leave the ice on for 15 to 20 minutes, 3 to 4 times a day for the first 2 to 3 days. After that, you can switch between ice and heat packs.   Ask your doctor about back exercises or massage.   Avoid feeling anxious or stressed. Find good ways to deal with stress, such as exercise.  GET HELP RIGHT AWAY IF:   Your pain does not go away with rest or medicine.   Your pain does not go away in 1 week.   You have new problems.   You do not feel well.   The pain spreads into your legs.   You cannot control when you poop (bowel movement) or pee (urinate).   Your arms or legs feel weak or lose feeling (numbness).   You feel sick to your stomach (nauseous) or throw up (vomit).   You have belly (abdominal)  pain.   You feel like you may pass out (faint).  MAKE SURE YOU:   Understand these instructions.   Will watch your condition.   Will get help right away if you are not doing well or get worse.  Document Released: 01/02/2008 Document Revised: 07/05/2011 Document Reviewed: 12/04/2010 [facility] Patient Information 2012 [name], [name].Back Pain, Adult Back pain is very common. The pain often gets better over time. The cause of back pain is usually not dangerous. Most people can learn to manage their back pain on their own.  HOME CARE   Stay active. Start with short walks on flat ground if you can. Try to walk farther each day.   Do not sit, drive, or stand in one place for more than 30 minutes. Do not stay in bed.   Do not avoid exercise or work. Activity can help your back heal faster.   Be careful when you bend or lift an object. Bend at your knees, keep the object close to you, and do not twist.   Sleep on a firm mattress. Lie on your side, and  bend your knees. If you lie on your back, put a pillow under your knees.   Only take medicines as told by your doctor.   Put ice on the injured area.   Put ice in a plastic bag.   Place a towel between your skin and the bag.   Leave the ice on for 15 to 20 minutes, 3 to 4 times a day for the first 2 to 3 days. After that, you can switch between ice and heat packs.   Ask your doctor about back exercises or massage.   Avoid feeling anxious or stressed. Find good ways to deal with stress, such as exercise.  GET HELP RIGHT AWAY IF:   Your pain does not go away with rest or medicine.   Your pain does not go away in 1 week.   You have new problems.   You do not feel well.   The pain spreads into your legs.   You cannot control when you poop (bowel movement) or pee (urinate).   Your arms or legs feel weak or lose feeling (numbness).   You feel sick to your stomach (nauseous) or throw up (vomit).   You have belly (abdominal)  pain.   You feel like you may pass out (faint).  MAKE SURE YOU:   Understand these instructions.   Will watch your condition.   Will get help right away if you are not doing well or get worse.  Document Released: 01/02/2008 Document Revised: 07/05/2011 Document Reviewed: 12/04/2010 [facility] Patient Information 2012 [name], [name].Back Pain, Adult Back pain is very common. The pain often gets better over time. The cause of back pain is usually not dangerous. Most people can learn to manage their back pain on their own.  HOME CARE   Stay active. Start with short walks on flat ground if you can. Try to walk farther each day.   Do not sit, drive, or stand in one place for more than 30 minutes. Do not stay in bed.   Do not avoid exercise or work. Activity can help your back heal faster.   Be careful when you bend or lift an object. Bend at your knees, keep the object close to you, and do not twist.   Sleep on a firm mattress. Lie on your side, and bend your knees. If you lie on your back, put a pillow under your knees.   Only take medicines as told by your doctor.   Put ice on the injured area.   Put ice in a plastic bag.   Place a towel between your skin and the bag.   Leave the ice on for 15 to 20 minutes, 3 to 4 times a day for the first 2 to 3 days. After that, you can switch between ice and heat packs.   Ask your doctor about back exercises or massage.   Avoid feeling anxious or stressed. Find good ways to deal with stress, such as exercise.  GET HELP RIGHT AWAY IF:   Your pain does not go away with rest or medicine.   Your pain does not go away in 1 week.   You have new problems.   You do not feel well.   The pain spreads into your legs.   You cannot control when you poop (bowel movement) or pee (urinate).   Your arms or legs feel weak or lose feeling (numbness).   You feel sick to your stomach (nauseous) or throw up (vomit).  You have belly (abdominal)  pain.   You feel like you may pass out (faint).  MAKE SURE YOU:   Understand these instructions.   Will watch your condition.   Will get help right away if you are not doing well or get worse.  Document Released: 01/02/2008 Document Revised: 07/05/2011 Document Reviewed: 12/04/2010 [facility] Patient Information 2012 [place], [name].Back Pain, Adult Back pain is very common. The pain often gets better over time. The cause of back pain is usually not dangerous. Most people can learn to manage their back pain on their own.  HOME CARE   Stay active. Start with short walks on flat ground if you can. Try to walk farther each day.   Do not sit, drive, or stand in one place for more than 30 minutes. Do not stay in bed.   Do not avoid exercise or work. Activity can help your back heal faster.   Be careful when you bend or lift an object. Bend at your knees, keep the object close to you, and do not twist.   Sleep on a firm mattress. Lie on your side, and bend your knees. If you lie on your back, put a pillow under your knees.   Only take medicines as told by your doctor.   Put ice on the injured area.   Put ice in a plastic bag.   Place a towel between your skin and the bag.   Leave the ice on for 15 to 20 minutes, 3 to 4 times a day for the first 2 to 3 days. After that, you can switch between ice and heat packs.   Ask your doctor about back exercises or massage.   Avoid feeling anxious or stressed. Find good ways to deal with stress, such as exercise.  GET HELP RIGHT AWAY IF:   Your pain does not go away with rest or medicine.   Your pain does not go away in 1 week.   You have new problems.   You do not feel well.   The pain spreads into your legs.   You cannot control when you poop (bowel movement) or pee (urinate).   Your arms or legs feel weak or lose feeling (numbness).   You feel sick to your stomach (nauseous) or throw up (vomit).   You have belly (abdominal)  pain.   You feel like you may pass out (faint).  MAKE SURE YOU:   Understand these instructions.   Will watch your condition.   Will get help right away if you are not doing well or get worse.  Document Released: 01/02/2008 Document Revised: 07/05/2011 Document Reviewed: 12/04/2010 [facility] Patient Information 2012 [place], [name].

## 2011-12-17 NOTE — ED Provider Notes (Signed)
History     CSN: [id number]  Arrival date & time 12/17/11  1137   First MD Initiated Contact with Patient 12/17/11 1230      Chief Complaint  Patient presents with  . Back Pain    Pt reports 24 hx of low back pain    (Consider location/radiation/quality/duration/timing/severity/associated sxs/prior treatment) Patient is a 41 y.o. female presenting with back pain. The history is provided by the patient. No language interpreter was used.  Back Pain  This is a new problem. The current episode started yesterday. The problem occurs constantly. The problem has been gradually worsening. The pain is associated with no known injury. The quality of the pain is described as aching. The pain radiates to the left thigh. The pain is at a severity of 9/10. The pain is the same all the time. Pertinent negatives include no abdominal pain and no dysuria.   Patient reports lower back pain that started in church yesterday. States it has gradually worsened and last night she had shooting pain into her left buttocks and left thigh. Today the pain is mainly in bilateral lower back with CVA tenderness on the left. Denies fever bowel or bladder problems weakness.  Good reflexes and sensation.  Took tylenol yesterday for pain with no relief.  Denies numbness and tingling.  pmh asthma, arthritis.    Past Medical History  Diagnosis Date  . Asthma   . Seasonal allergies     Past Surgical History  Procedure Date  . Cholecystectomy   . Cesarean section   . Tubal ligation     Family History  Problem Relation Age of Onset  . Hypertension Mother   . Diabetes Mother   . Cancer Mother     History  Substance Use Topics  . Smoking status: Never Smoker   . Smokeless tobacco: Never Used  . Alcohol Use: No    OB History    Grav Para Term Preterm Abortions TAB SAB Ect Mult Living                  Review of Systems  Constitutional: Negative.   HENT: Negative.   Eyes: Negative.   Respiratory: Negative.   Negative for shortness of breath.   Cardiovascular: Negative.   Gastrointestinal: Negative.  Negative for nausea, vomiting and abdominal pain.  Genitourinary: Positive for flank pain. Negative for dysuria, urgency, frequency, hematuria, vaginal bleeding and vaginal discharge.  Musculoskeletal: Positive for back pain.  Skin: Negative.   Neurological: Negative.   Psychiatric/Behavioral: Negative.   All other systems reviewed and are negative.    Allergies  Review of patient's allergies indicates no known allergies.  Home Medications   Current Outpatient Rx  Name Route Sig Dispense Refill  . ACETAMINOPHEN 500 MG PO TABS Oral Take 1,000 mg by mouth every 6 (six) hours as needed. Fever and pain    . ALBUTEROL SULFATE HFA 108 (90 BASE) MCG/ACT IN AERS Inhalation Inhale 2 puffs into the lungs every 6 (six) hours as needed. Shortness of breath and wheezing    . ALBUTEROL SULFATE (2.5 MG/3ML) 0.083% IN NEBU Nebulization Take 3 mLs (2.5 mg total) by nebulization every 6 (six) hours as needed for wheezing or shortness of breath. 75 mL 12  . ALBUTEROL SULFATE (5 MG/ML) 0.5% IN NEBU Nebulization Take 0.5 mLs (2.5 mg total) by nebulization every 6 (six) hours as needed for wheezing or shortness of breath. 20 mL 12  . BECLOMETHASONE DIPROPIONATE 80 MCG/ACT IN AERS Inhalation Inhale 1  puff into the lungs 2 (two) times daily.    Marland Kitchen LORATADINE 10 MG PO TABS Oral Take 1 tablet (10 mg total) by mouth daily. 30 tablet 0  . ADULT MULTIVITAMIN W/MINERALS CH Oral Take 2 tablets by mouth daily.    Marland Kitchen PREDNISONE 50 MG PO TABS Oral Take 1 tablet (50 mg total) by mouth daily. 7 tablet 0  . PREDNISONE PO Oral Take by mouth. Taper 5 tablets then reduce by one each day      BP 119/72  Pulse 73  Temp 98.4 F (36.9 C)  Resp 18  SpO2 97%  LMP 12/02/2011  Physical Exam  Nursing note and vitals reviewed. Constitutional: She is oriented to person, place, and time. She appears well-developed and well-nourished.    HENT:  Head: Normocephalic and atraumatic.  Eyes: Conjunctivae and EOM are normal. Pupils are equal, round, and reactive to light.  Neck: Normal range of motion. Neck supple.  Cardiovascular: Normal rate.   Pulmonary/Chest: Effort normal.  Abdominal: Soft.  Musculoskeletal: Normal range of motion. She exhibits tenderness. She exhibits no edema.       Bilateral lower back pain with L cva tenderness  Neurological: She is alert and oriented to person, place, and time. She has normal reflexes.  Skin: Skin is warm and dry.  Psychiatric: She has a normal mood and affect.    ED Course  Procedures (including critical care time)   Labs Reviewed  URINALYSIS, ROUTINE W REFLEX MICROSCOPIC  PREGNANCY, URINE   No results found.   No diagnosis found.    MDM  UTI with bilateral lower back pain and L cva tenderness.  Percocet for pain.  11-20 WBC in urine.  Will treat with macrodantin, zofran .  Follow up with gyn this week or return if worse n/v fever.  No cauda equina symptoms. CT abd negative for stone. Labs Reviewed  URINALYSIS, ROUTINE W REFLEX MICROSCOPIC - Abnormal; Notable for the following:    APPearance CLOUDY (*)    Specific Gravity, Urine 1.031 (*)    Leukocytes, UA MODERATE (*)    All other components within normal limits  URINE MICROSCOPIC-ADD ON - Abnormal; Notable for the following:    Squamous Epithelial / LPF FEW (*)    Bacteria, UA FEW (*)    All other components within normal limits  CBC - Abnormal; Notable for the following:    RBC 3.78 (*)    Hemoglobin 11.0 (*)    HCT 33.6 (*)    All other components within normal limits  DIFFERENTIAL - Abnormal; Notable for the following:    Neutrophils Relative 34 (*)    Lymphocytes Relative 56 (*)    All other components within normal limits  POCT I-STAT, CHEM 8 - Abnormal; Notable for the following:    Hemoglobin 11.6 (*)    HCT 34.0 (*)    All other components within normal limits  PREGNANCY, URINE  URINE CULTURE           Remi Haggard, NP 12/17/11 2002  Remi Haggard, NP 12/17/11 2003

## 2011-12-18 LAB — URINE CULTURE
Colony Count: 5000
Culture  Setup Time: 201305210132

## 2011-12-18 NOTE — ED Provider Notes (Signed)
Medical screening examination/treatment/procedure(s) were performed by non-physician practitioner and as supervising physician I was immediately available for consultation/collaboration.   Cletis Muma M Kellan Boehlke, MD 12/18/11 1320 

## 2011-12-19 ENCOUNTER — Emergency Department (HOSPITAL_COMMUNITY)
Admission: EM | Admit: 2011-12-19 | Discharge: 2011-12-19 | Disposition: A | Discharge status: Home / Self Care | Payer: Self-pay | Attending: Emergency Medicine | Admitting: Emergency Medicine

## 2011-12-19 ENCOUNTER — Encounter (HOSPITAL_COMMUNITY): Payer: Self-pay | Admitting: *Deleted

## 2011-12-19 DIAGNOSIS — N12 Tubulo-interstitial nephritis, not specified as acute or chronic: Secondary | ICD-10-CM | POA: Insufficient documentation

## 2011-12-19 DIAGNOSIS — R109 Unspecified abdominal pain: Secondary | ICD-10-CM | POA: Insufficient documentation

## 2011-12-19 DIAGNOSIS — J45909 Unspecified asthma, uncomplicated: Secondary | ICD-10-CM | POA: Insufficient documentation

## 2011-12-19 DIAGNOSIS — Z79899 Other long term (current) drug therapy: Secondary | ICD-10-CM | POA: Insufficient documentation

## 2011-12-19 DIAGNOSIS — Z9109 Other allergy status, other than to drugs and biological substances: Secondary | ICD-10-CM | POA: Insufficient documentation

## 2011-12-19 LAB — URINE MICROSCOPIC-ADD ON

## 2011-12-19 LAB — BASIC METABOLIC PANEL
BUN: 12 mg/dL (ref 6–23)
Calcium: 8.7 mg/dL (ref 8.4–10.5)
Creatinine, Ser: 0.65 mg/dL (ref 0.50–1.10)
GFR calc non Af Amer: >90 mL/min (ref >90)
Glucose, Bld: 90 mg/dL (ref 70–99)

## 2011-12-19 LAB — DIFFERENTIAL
Eosinophils Absolute: 0.2 10*3/uL (ref 0.0–0.7)
Eosinophils Relative: 4 % (ref 0–5)
Lymphs Abs: 2.2 10*3/uL (ref 0.7–4.0)
Monocytes Relative: 9 % (ref 3–12)

## 2011-12-19 LAB — URINALYSIS, ROUTINE W REFLEX MICROSCOPIC
Hgb urine dipstick: NEGATIVE
Protein, ur: NEGATIVE mg/dL
Urobilinogen, UA: 1 mg/dL (ref 0.0–1.0)

## 2011-12-19 LAB — CBC
Hemoglobin: 11.5 g/dL — ABNORMAL LOW (ref 12.0–15.0)
MCH: 28.8 pg (ref 26.0–34.0)
MCV: 88.5 fL (ref 78.0–100.0)
Platelets: 228 10*3/uL (ref 150–400)
RBC: 3.99 MIL/uL (ref 3.87–5.11)

## 2011-12-19 MED ORDER — DEXTROSE 5 % IV SOLN
1.0000 g | Freq: Once | INTRAVENOUS | Status: AC
  Administered 2011-12-19: 1 g via INTRAVENOUS
  Filled 2011-12-19: qty 10

## 2011-12-19 MED ORDER — KETOROLAC TROMETHAMINE 30 MG/ML IJ SOLN
30.0000 mg | Freq: Once | INTRAMUSCULAR | Status: AC
  Administered 2011-12-19: 30 mg via INTRAVENOUS
  Filled 2011-12-19: qty 1

## 2011-12-19 MED ORDER — SODIUM CHLORIDE 0.9 % IV BOLUS (SEPSIS)
1000.0000 mL | Freq: Once | INTRAVENOUS | Status: AC
  Administered 2011-12-19: 1000 mL via INTRAVENOUS

## 2011-12-19 MED ORDER — CIPROFLOXACIN HCL 500 MG PO TABS: 500.0000 mg | ORAL_TABLET | Freq: Two times a day (BID) | ORAL | Status: AC

## 2011-12-19 NOTE — ED Notes (Addendum)
Pt was seen on Monday for the same pain. Pt was treated for a UTI. Pt reports lower left sided back pain that is constant but worse with movement. Pt was given an antibiotic shot and percocet to take at home. Pt last dose of percocet was last night and pt reports this medication does not help.  Pt denies N/V. Pt denies painful urination and denies increased urinary freuqency or abdominal pain. Pt reports pain started on Saturday and gradually became worse on Sunday. Pt reports pain is 10/10 today.

## 2011-12-19 NOTE — ED Provider Notes (Signed)
History     CSN: [id number]  Arrival date & time 12/19/11  1619   First MD Initiated Contact with Patient 12/19/11 1826      Chief Complaint  Patient presents with  . Back Pain    (Consider location/radiation/quality/duration/timing/severity/associated sxs/prior treatment) HPI Comments: Patient comes in today with a chief complaint of left flank pain.  She reports that the pain has been present for the past 3 days.  She was seen in the ED three days ago and diagnosed with an UTI.  At that time she was prescribed Macrobid, which she has been taking.  However, she feels that her symptoms are getting worse.  She feels that she may have had a fever, but has not taken her temperature.    Patient is a 41 y.o. female presenting with flank pain. The history is provided by the patient.  Flank Pain The problem occurs constantly. The problem has been gradually worsening. Associated symptoms include chills and a fever. Pertinent negatives include no abdominal pain, change in bowel habit, chest pain, diaphoresis, nausea, rash, urinary symptoms or vomiting. The symptoms are aggravated by nothing. She has tried oral narcotics for the symptoms. The treatment provided no relief.    Past Medical History  Diagnosis Date  . Asthma   . Seasonal allergies     Past Surgical History  Procedure Date  . Cholecystectomy   . Cesarean section   . Tubal ligation     Family History  Problem Relation Age of Onset  . Hypertension Mother   . Diabetes Mother   . Cancer Mother     History  Substance Use Topics  . Smoking status: Never Smoker   . Smokeless tobacco: Never Used  . Alcohol Use: No    OB History    Grav Para Term Preterm Abortions TAB SAB Ect Mult Living                  Review of Systems  Constitutional: Positive for fever and chills. Negative for diaphoresis and appetite change.  Respiratory: Negative for shortness of breath.   Cardiovascular: Negative for chest pain.    Gastrointestinal: Negative for nausea, vomiting, abdominal pain and change in bowel habit.  Genitourinary: Positive for flank pain. Negative for dysuria, urgency, hematuria, decreased urine volume, vaginal bleeding, vaginal discharge, difficulty urinating and pelvic pain.  Skin: Negative for rash.  Neurological: Negative for dizziness, syncope and light-headedness.    Allergies  Review of patient's allergies indicates no known allergies.  Home Medications   Current Outpatient Rx  Name Route Sig Dispense Refill  . ALBUTEROL SULFATE HFA 108 (90 BASE) MCG/ACT IN AERS Inhalation Inhale 2 puffs into the lungs every 6 (six) hours as needed. Shortness of breath and wheezing    . ALBUTEROL SULFATE (2.5 MG/3ML) 0.083% IN NEBU Nebulization Take 3 mLs (2.5 mg total) by nebulization every 6 (six) hours as needed for wheezing or shortness of breath. 75 mL 12  . BECLOMETHASONE DIPROPIONATE 80 MCG/ACT IN AERS Inhalation Inhale 1 puff into the lungs 2 (two) times daily.    Marland Kitchen NITROFURANTOIN MONOHYD MACRO 100 MG PO CAPS Oral Take 1 capsule (100 mg total) by mouth 2 (two) times daily. 10 capsule 0  . OXYCODONE-ACETAMINOPHEN 5-325 MG PO TABS Oral Take 1 tablet by mouth every 4 (four) hours as needed for pain. 10 tablet 0  . PREDNISONE 50 MG PO TABS Oral Take 1 tablet (50 mg total) by mouth daily. 7 tablet 0  BP 147/99  Pulse 64  Temp(Src) 99.7 F (37.6 C) (Oral)  Resp 18  SpO2 98%  LMP 12/02/2011  Physical Exam  Nursing note and vitals reviewed. Constitutional: She appears well-developed and well-nourished. No distress.  HENT:  Head: Normocephalic and atraumatic.  Mouth/Throat: Oropharynx is clear and moist.  Neck: Normal range of motion. Neck supple.  Cardiovascular: Normal rate, regular rhythm and normal heart sounds.   Pulmonary/Chest: Effort normal. No respiratory distress. She has no wheezes. She has no rales.  Abdominal: Soft. Bowel sounds are normal. She exhibits no distension and no  mass. There is no tenderness. There is CVA tenderness. There is no rigidity, no rebound and no guarding.       Left CVA tenderness  Neurological: She is alert. She has normal strength. Gait normal.  Skin: Skin is warm and dry. She is not diaphoretic.  Psychiatric: She has a normal mood and affect.    ED Course  Procedures (including critical care time)  Labs Reviewed  URINALYSIS, ROUTINE W REFLEX MICROSCOPIC - Abnormal; Notable for the following:    Color, Urine ORANGE (*) BIOCHEMICALS MAY BE AFFECTED BY COLOR   APPearance CLOUDY (*)    Nitrite POSITIVE (*)    Leukocytes, UA MODERATE (*)    All other components within normal limits  URINE MICROSCOPIC-ADD ON - Abnormal; Notable for the following:    Squamous Epithelial / LPF FEW (*)    Bacteria, UA FEW (*)    All other components within normal limits  CBC - Abnormal; Notable for the following:    Hemoglobin 11.5 (*)    HCT 35.3 (*)    All other components within normal limits  DIFFERENTIAL  BASIC METABOLIC PANEL  URINE CULTURE   No results found.   No diagnosis found.    MDM  Patient presenting with left flank pain for the past 3 days.  UA shows UTI.  She had a CT scan ab/pelvis done 3 days ago, which did not show any evidence of kidney stones.  Patient given IVF and IV Ceftriaxone while in the ED.  She reports that symptoms have improved.  Patient is afebrile.  WBC within normal limits.  Therefore, feel that patient can be discharged home.  Return precautions discussed.        Pascal Lux [place], PA-C 12/20/11 1512

## 2011-12-19 NOTE — ED Notes (Signed)
Pt gone to the restroom. Will get labs when pt returns,

## 2011-12-19 NOTE — ED Notes (Signed)
IV removed from Left Hand. Intact.

## 2011-12-20 ENCOUNTER — Encounter (HOSPITAL_COMMUNITY): Payer: Self-pay | Admitting: *Deleted

## 2011-12-20 ENCOUNTER — Inpatient Hospital Stay (HOSPITAL_COMMUNITY)
Admission: AD | Admit: 2011-12-20 | Discharge: 2011-12-20 | Disposition: A | Discharge status: Home / Self Care | Payer: Self-pay | Source: Ambulatory Visit | Attending: Obstetrics & Gynecology | Admitting: Obstetrics & Gynecology

## 2011-12-20 ENCOUNTER — Inpatient Hospital Stay (HOSPITAL_COMMUNITY): Payer: Self-pay

## 2011-12-20 DIAGNOSIS — N390 Urinary tract infection, site not specified: Secondary | ICD-10-CM | POA: Insufficient documentation

## 2011-12-20 DIAGNOSIS — R102 Pelvic and perineal pain: Secondary | ICD-10-CM

## 2011-12-20 DIAGNOSIS — N949 Unspecified condition associated with female genital organs and menstrual cycle: Secondary | ICD-10-CM | POA: Insufficient documentation

## 2011-12-20 DIAGNOSIS — A599 Trichomoniasis, unspecified: Secondary | ICD-10-CM

## 2011-12-20 DIAGNOSIS — A5901 Trichomonal vulvovaginitis: Secondary | ICD-10-CM | POA: Insufficient documentation

## 2011-12-20 LAB — WET PREP, GENITAL: Yeast Wet Prep HPF POC: NONE SEEN

## 2011-12-20 LAB — URINE CULTURE

## 2011-12-20 MED ORDER — METRONIDAZOLE 500 MG PO TABS: 500.0000 mg | ORAL_TABLET | Freq: Two times a day (BID) | ORAL | Status: AC

## 2011-12-20 MED ORDER — KETOROLAC TROMETHAMINE 60 MG/2ML IM SOLN
60.0000 mg | Freq: Once | INTRAMUSCULAR | Status: AC
  Administered 2011-12-20: 60 mg via INTRAMUSCULAR
  Filled 2011-12-20: qty 2

## 2011-12-20 NOTE — MAU Note (Signed)
Pt was dx with uti on Monday at [facility].  Had ct done that showed fluid around kidney, uterus and bladder.  Had rx changed yesterday, states symptoms have no gotten any better.  Reports left sided back pain.

## 2011-12-20 NOTE — MAU Provider Note (Signed)
Medical Screening exam and patient care preformed by advanced practice provider.  Agree with the above management.  

## 2011-12-20 NOTE — MAU Provider Note (Signed)
History     CSN: [id number]  Arrival date & time 12/20/11  1052   None     No chief complaint on file.   HPI Wendy James is a 41 y.o. female who is not pregnant. She presents to MAU for UTI and back pain. She was evaluated at [facility] 5/20 for UTI and treated with Macrobid.  She returned to the ED yesterday due to increased pain. She was given Rocephin 1 gram and told to come to MAU after having a CT scan that showed fluid in the endometrium. Today she reports vaginal spotting. LMP 12/02/11.  Current sex partner x 1 year, history of trichomonas and was treated. Last pap smear last year and was normal. The history was provided by the patient and her medical record.  Past Medical History  Diagnosis Date  . Asthma   . Seasonal allergies     Past Surgical History  Procedure Date  . Cholecystectomy   . Cesarean section   . Tubal ligation     Family History  Problem Relation Age of Onset  . Hypertension Mother   . Diabetes Mother   . Cancer Mother     History  Substance Use Topics  . Smoking status: Never Smoker   . Smokeless tobacco: Never Used  . Alcohol Use: No    OB History    Grav Para Term Preterm Abortions TAB SAB Ect Mult Living   5 4 4  0 1 0 1 0 0 3      Review of Systems  Constitutional: Negative for fever, chills, diaphoresis and fatigue.  HENT: Negative for ear pain, congestion, sore throat, facial swelling, neck pain, neck stiffness, dental problem and sinus pressure.   Eyes: Negative for photophobia, pain and discharge.  Respiratory: Positive for cough and wheezing (asthnma). Negative for chest tightness.   Cardiovascular: Negative for chest pain and palpitations.  Gastrointestinal: Positive for abdominal pain. Negative for nausea, vomiting, diarrhea, constipation and abdominal distention.  Genitourinary: Positive for dysuria, urgency, frequency, flank pain, vaginal bleeding and vaginal discharge. Negative for difficulty urinating.  Musculoskeletal: Positive  for back pain. Negative for myalgias and gait problem.  Skin: Negative for color change and rash.  Neurological: Negative for dizziness, speech difficulty, weakness, light-headedness, numbness and headaches.  Psychiatric/Behavioral: Negative for confusion and agitation. The patient is not nervous/anxious.     Allergies  Review of patient's allergies indicates no known allergies.  Home Medications  No current outpatient prescriptions on file.  BP 131/81  Pulse 71  Temp(Src) 98.6 F (37 C) (Oral)  Resp 20  Ht 5' 3.25" (1.607 m)  Wt 204 lb (92.534 kg)  BMI 35.85 kg/m2  LMP 12/02/2011  Physical Exam  Nursing note and vitals reviewed. Constitutional: She is oriented to person, place, and time. She appears well-developed and well-nourished.  HENT:  Head: Normocephalic.  Eyes: EOM are normal.  Neck: Neck supple.  Cardiovascular: Normal rate.   Pulmonary/Chest: Effort normal.  Abdominal: Soft. There is no tenderness.  Musculoskeletal: Normal range of motion.       Left flank pain    Neurological: She is alert and oriented to person, place, and time. No cranial nerve deficit.  Skin: Skin is warm and dry.  Psychiatric: She has a normal mood and affect. Her behavior is normal. Judgment and thought content normal.    Blood work done at [facility] yesterday and was normal.  Results for orders placed during the hospital encounter of 12/20/11 (from the past 24 hour(s))  POCT PREGNANCY, URINE     Status: Normal   Collection Time   12/20/11 12:16 PM      Component Value Range   Preg Test, Ur NEGATIVE  NEGATIVE   WET PREP, GENITAL     Status: Abnormal   Collection Time   12/20/11 12:45 PM      Component Value Range   Yeast Wet Prep HPF POC NONE SEEN  NONE SEEN    Trich, Wet Prep MANY (*) NONE SEEN    Clue Cells Wet Prep HPF POC NONE SEEN  NONE SEEN    WBC, Wet Prep HPF POC MODERATE (*) NONE SEEN    Assessment: Trichomonas vaginosis   Pelvic pain   UTI  Plan:  Cipro ( patient given  Rx yesterday)   Pyridium Rx   Flagyl Rx   Follow up with GYN Clinic in 2 weeks   Return here as needed. ED Course  Procedures   MDM

## 2011-12-20 NOTE — MAU Note (Signed)
Went [facility] on Mon.  Was dx with UTI.  Pain got worse, went to hosp yesterday- was told had fluid around bladder, kidney and uterus.  Was told needed [place] to check out why she has all that fluid.

## 2011-12-20 NOTE — Discharge Instructions (Signed)
SOMEONE WILL CALL YOU FROM THE GYN OFFICE TO SCHEDULE A FOLLOW UP APPOINTMENT. IF YOUR SYMPTOMS WORSEN BEFORE THEN RETURN HERE.  Trichomoniasis Trichomoniasis is an infection, caused by the Trichomonas organism, that affects both women and men. In women, the outer female genitalia and the vagina are affected. In men, the penis is mainly affected, but the prostate and other reproductive organs can also be involved. Trichomoniasis is a sexually transmitted disease (STD) and is most often passed to another person through sexual contact. The majority of people who get trichomoniasis do so from a sexual encounter and are also at risk for other STDs. CAUSES   Sexual intercourse with an infected partner.   It can be present in swimming pools or hot tubs.  SYMPTOMS   Abnormal gray-green frothy vaginal discharge in women.   Vaginal itching and irritation in women.   Itching and irritation of the area outside the vagina in women.   Penile discharge with or without pain in males.   Inflammation of the urethra (urethritis), causing painful urination.   Bleeding after sexual intercourse.  RELATED COMPLICATIONS  Pelvic inflammatory disease.   Infection of the uterus (endometritis).   Infertility.   Tubal (ectopic) pregnancy.   It can be associated with other STDs, including gonorrhea and chlamydia, hepatitis B, and HIV.  COMPLICATIONS DURING PREGNANCY  Early (premature) delivery.   Premature rupture of the membranes (PROM).   Low birth weight.  DIAGNOSIS   Visualization of Trichomonas under the microscope from the vagina discharge.   Ph of the vagina greater than 4.5, tested with a test tape.   Trich Rapid Test.   Culture of the organism, but this is not usually needed.   It may be found on a Pap test.   Having a "strawberry cervix,"which means the cervix looks very red like a strawberry.  TREATMENT   You may be given medication to fight the infection. Inform your caregiver  if you could be or are pregnant. Some medications used to treat the infection should not be taken during pregnancy.   Over-the-counter medications or creams to decrease itching or irritation may be recommended.   Your sexual partner will need to be treated if infected.  HOME CARE INSTRUCTIONS   Take all medication prescribed by your caregiver.   Take over-the-counter medication for itching or irritation as directed by your caregiver.   Do not have sexual intercourse while you have the infection.   Do not douche or wear tampons.   Discuss your infection with your partner, as your partner may have acquired the infection from you. Or, your partner may have been the person who transmitted the infection to you.   Have your sex partner examined and treated if necessary.   Practice safe, informed, and protected sex.   See your caregiver for other STD testing.  SEEK MEDICAL CARE IF:   You still have symptoms after you finish the medication.   You have an oral temperature above 102 F (38.9 C).   You develop belly (abdominal) pain.   You have pain when you urinate.   You have bleeding after sexual intercourse.   You develop a rash.   The medication makes you sick or makes you throw up (vomit).  Document Released: 01/09/2001 Document Revised: 07/05/2011 Document Reviewed: 02/04/2009 [facility] Patient Information 2012 [place], [name].Urinary Tract Infection Infections of the urinary tract can start in several places. A bladder infection (cystitis), a kidney infection (pyelonephritis), and a prostate infection (prostatitis) are different types  of urinary tract infections (UTIs). They usually get better if treated with medicines (antibiotics) that kill germs. Take all the medicine until it is gone. You or your child may feel better in a few days, but TAKE ALL MEDICINE or the infection may not respond and may become more difficult to treat. HOME CARE INSTRUCTIONS   Drink enough water and  fluids to keep the urine clear or pale yellow. Cranberry juice is especially recommended, in addition to large amounts of water.   Avoid caffeine, tea, and carbonated beverages. They tend to irritate the bladder.   Alcohol may irritate the prostate.   Only take over-the-counter or prescription medicines for pain, discomfort, or fever as directed by your caregiver.  To prevent further infections:  Empty the bladder often. Avoid holding urine for long periods of time.   After a bowel movement, women should cleanse from front to back. Use each tissue only once.   Empty the bladder before and after sexual intercourse.  FINDING OUT THE RESULTS OF YOUR TEST Not all test results are available during your visit. If your or your child's test results are not back during the visit, make an appointment with your caregiver to find out the results. Do not assume everything is normal if you have not heard from your caregiver or the medical facility. It is important for you to follow up on all test results. SEEK MEDICAL CARE IF:   There is back pain.   Your baby is older than 3 months with a rectal temperature of 100.5 F (38.1 C) or higher for more than 1 day.   Your or your child's problems (symptoms) are no better in 3 days. Return sooner if you or your child is getting worse.  SEEK IMMEDIATE MEDICAL CARE IF:   There is severe back pain or lower abdominal pain.   You or your child develops chills.   You have a fever.   Your baby is older than 3 months with a rectal temperature of 102 F (38.9 C) or higher.   Your baby is 12 months old or younger with a rectal temperature of 100.4 F (38 C) or higher.   There is nausea or vomiting.   There is continued burning or discomfort with urination.  MAKE SURE YOU:   Understand these instructions.   Will watch your condition.   Will get help right away if you are not doing well or get worse.  Document Released: 04/25/2005 Document Revised:  07/05/2011 Document Reviewed: 11/28/2006 [facility] Patient Information 2012 [place], [name].

## 2011-12-20 NOTE — MAU Note (Signed)
Left CVA tenderness.  States was called and rx was changed.

## 2011-12-21 LAB — GC/CHLAMYDIA PROBE AMP, GENITAL: GC Probe Amp, Genital: NEGATIVE

## 2011-12-21 NOTE — ED Provider Notes (Signed)
Medical screening examination/treatment/procedure(s) were performed by non-physician practitioner and as supervising physician I was immediately available for consultation/collaboration.  Kiandria Clum L Elverta Dimiceli, MD 12/21/11 1623 

## 2012-01-03 ENCOUNTER — Ambulatory Visit: Payer: Self-pay | Admitting: Physician Assistant

## 2012-01-07 ENCOUNTER — Telehealth: Payer: Self-pay | Admitting: Obstetrics and Gynecology

## 2012-01-07 NOTE — Telephone Encounter (Signed)
Patient called and stated she had an appointment. When checked the appointment had been moved. I stated to the patient about appointment. She said nobody called her, so I went over the number with her to make sure we had the right number. Patient stated the number was correct. I called patient to give her the appointment, and whom ever answered the phone stated I had the wrong.

## 2012-01-10 ENCOUNTER — Ambulatory Visit (INDEPENDENT_AMBULATORY_CARE_PROVIDER_SITE_OTHER): Payer: Self-pay | Admitting: Physician Assistant

## 2012-01-10 VITALS — BP 133/77 | HR 74 | Temp 98.8°F | Wt 199.1 lb

## 2012-01-10 DIAGNOSIS — N920 Excessive and frequent menstruation with regular cycle: Secondary | ICD-10-CM

## 2012-01-10 DIAGNOSIS — N946 Dysmenorrhea, unspecified: Secondary | ICD-10-CM

## 2012-01-10 LAB — POCT URINALYSIS DIP (DEVICE)
Hgb urine dipstick: NEGATIVE
Nitrite: NEGATIVE
Protein, ur: NEGATIVE mg/dL
Urobilinogen, UA: 0.2 mg/dL (ref 0.0–1.0)
pH: 5.5 (ref 5.0–8.0)

## 2012-01-10 MED ORDER — DICLOFENAC SODIUM 75 MG PO TBEC
75.0000 mg | DELAYED_RELEASE_TABLET | Freq: Two times a day (BID) | ORAL | Status: DC
Start: 2012-01-10 — End: 2012-01-27

## 2012-01-10 NOTE — Progress Notes (Signed)
Chief Complaint:  Pelvic Pain   Wendy James is  41 y.o. [id number].  Patient's last menstrual period was 12/20/2011..   She presents complaining of Pelvic Pain  Presents as follow up from MAU. Reports heavy painful periods that come every month. First day requires changing a pad Q 1, no clots. Desires relief from heavy flow and cramping.  Obstetrical/Gynecological History: OB History    Grav Para Term Preterm Abortions TAB SAB Ect Mult Living   5 4 4  0 1 0 1 0 0 3      Past Medical History: Past Medical History  Diagnosis Date  . Asthma   . Seasonal allergies     Past Surgical History: Past Surgical History  Procedure Date  . Cholecystectomy   . Cesarean section   . Tubal ligation     Family History: Family History  Problem Relation Age of Onset  . Hypertension Mother   . Diabetes Mother   . Cancer Mother     Social History: History  Substance Use Topics  . Smoking status: Never Smoker   . Smokeless tobacco: Never Used  . Alcohol Use: No    Allergies: No Known Allergies  Review of Systems - Breast ROS: negative for breast lumps Respiratory ROS: no cough, shortness of breath, or wheezing Cardiovascular ROS: no chest pain or dyspnea on exertion Gastrointestinal ROS: no abdominal pain, change in bowel habits, or black or bloody stools Genito-Urinary ROS: no dysuria, trouble voiding, or hematuria  Physical Exam   Blood pressure 133/77, pulse 74, temperature 98.8 F (37.1 C), temperature source Oral, weight 199 lb 1.6 oz (90.311 kg), last menstrual period 12/20/2011.  General: General appearance - alert, well appearing, and in no distress, oriented to person, place, and time and overweight Mental status - alert, oriented to person, place, and time, normal mood, behavior, speech, dress, motor activity, and thought processes, affect appropriate to mood Abdomen - soft, nontender, nondistended, no masses or organomegaly Focused Gynecological Exam: not  examined  Labs: Recent Results (from the past 24 hour(s))  POCT URINALYSIS DIP (DEVICE)   Collection Time   01/10/12  3:35 PM      Component Value Range   Glucose, UA NEGATIVE  NEGATIVE mg/dL   Bilirubin Urine NEGATIVE  NEGATIVE   Ketones, ur NEGATIVE  NEGATIVE mg/dL   Specific Gravity, Urine <=1.005  1.005 - 1.030   Hgb urine dipstick NEGATIVE  NEGATIVE   pH 5.5  5.0 - 8.0   Protein, ur NEGATIVE  NEGATIVE mg/dL   Urobilinogen, UA 0.2  0.0 - 1.0 mg/dL   Nitrite NEGATIVE  NEGATIVE   Leukocytes, UA NEGATIVE  NEGATIVE   Imaging Studies:  Ct Abdomen Pelvis Wo Contrast  12/17/2011  *RADIOLOGY REPORT*  Clinical Data: Left flank pain, nausea  CT ABDOMEN AND PELVIS WITHOUT CONTRAST  Technique:  Multidetector CT imaging of the abdomen and pelvis was performed following the standard protocol without intravenous contrast.  Comparison: None.  Findings: Lung bases are unremarkable.  Sagittal images of the spine shows minimal degenerative changes lower thoracic and lumbar spine. The patient is status post cholecystectomy.  Unenhanced liver is unremarkable.  Pancreas, spleen and adrenal glands are unremarkable.  Unenhanced kidneys are symmetrical in size.  No nephrolithiasis.  No hydronephrosis or hydroureter.  No calcified ureteral calculi are noted.  Moderate stool noted in the right colon and transverse colon.  No pericecal inflammation.  Normal appendix is clearly visualized in axial image 48.  Moderate nonspecific fluid is noted  within uterus. The study is limited without IV contrast.  No adnexal mass is noted.  The uterus measures 9 x 7.1 cm.  The urinary bladder is unremarkable.  IMPRESSION:  1.  No nephrolithiasis.  No hydronephrosis or hydroureter. 2.  No calcified ureteral calculi. 3.  Status post cholecystectomy. 4.  Moderate nonspecific fluid is noted within the endometrial cavity.  Correlation with GYN exam and pelvic ultrasound is suggested. 5.  Normal appendix is clearly visualized.  No pericecal  inflammation.  Original Report Authenticated By: Wendy James, M.D.   US Transvaginal Non-ob  12/20/2011  *RADIOLOGY REPORT*  Clinical Data: Pelvic pain for 1 week.  LMP 11/02/2011  TRANSABDOMINAL AND TRANSVAGINAL ULTRASOUND OF PELVIS Technique:  Both transabdominal and transvaginal ultrasound examinations of the pelvis were performed. Transabdominal technique was performed for global imaging of the pelvis including uterus, ovaries, adnexal regions, and pelvic cul-de-sac.  Comparison: 10/17/2009 and CT 12/17/2011   It was necessary to proceed with endovaginal exam following the transabdominal exam to visualize the myometrium, endometrium and adnexa.  Findings:  Uterus: Is anteverted and anteflexed and demonstrates a sagittal length of 9.2 cm, depth of 5.9 cm and width of 6.7 cm. The overall echotexture is mildly heterogeneous. No focal mural abnormalities are seen.  Endometrium: Is homogeneously echogenic with an AP width of 14 mm. No areas of focal thickening or heterogeneity are seen and this is likely due to a presecretory phase of the cycle.  Right ovary:  Has a normal appearance measuring 1.4 x 2.8 x 2.3 cm  Left ovary: Has a normal appearance measuring 3.2 x 1.9 x 1.6 cm  Other findings: A unilocular thin walled adnexal cyst is seen adjacent to the left ovary measuring 1.8 x 1.0 x 1.0 cm compatible with a benign paraovarian cyst.  No adnexal fluid is seen.  IMPRESSION: Mildly heterogeneous uterus with no definite focal mural abnormalities.  Presecretory appearance of the endometrial stripe with no definite focal abnormalities.  Normal ovaries and benign appearing left paraovarian cyst.  Original Report Authenticated By: Wendy James, M.D.   US Pelvis Complete  12/20/2011  *RADIOLOGY REPORT*  Clinical Data: Pelvic pain for 1 week.  LMP 11/02/2011  TRANSABDOMINAL AND TRANSVAGINAL ULTRASOUND OF PELVIS Technique:  Both transabdominal and transvaginal ultrasound examinations of the pelvis were performed.  Transabdominal technique was performed for global imaging of the pelvis including uterus, ovaries, adnexal regions, and pelvic cul-de-sac.  Comparison: 10/17/2009 and CT 12/17/2011   It was necessary to proceed with endovaginal exam following the transabdominal exam to visualize the myometrium, endometrium and adnexa.  Findings:  Uterus: Is anteverted and anteflexed and demonstrates a sagittal length of 9.2 cm, depth of 5.9 cm and width of 6.7 cm. The overall echotexture is mildly heterogeneous. No focal mural abnormalities are seen.  Endometrium: Is homogeneously echogenic with an AP width of 14 mm. No areas of focal thickening or heterogeneity are seen and this is likely due to a presecretory phase of the cycle.  Right ovary:  Has a normal appearance measuring 1.4 x 2.8 x 2.3 cm  Left ovary: Has a normal appearance measuring 3.2 x 1.9 x 1.6 cm  Other findings: A unilocular thin walled adnexal cyst is seen adjacent to the left ovary measuring 1.8 x 1.0 x 1.0 cm compatible with a benign paraovarian cyst.  No adnexal fluid is seen.  IMPRESSION: Mildly heterogeneous uterus with no definite focal mural abnormalities.  Presecretory appearance of the endometrial stripe with no definite focal abnormalities.  Normal  ovaries and benign appearing left paraovarian cyst.  Original Report Authenticated By: Wendy James, M.D.     Assessment: Patient Active Problem List  Diagnosis  . Menorrhagia  . Dysmenorrhea    Plan:   Arda Keadle E. 01/10/2012,4:08 PM

## 2012-01-10 NOTE — Patient Instructions (Signed)
Menorrhagia   Menorrhagia is when a menstrual period is heavier or longer than normal.  HOME CARE   Only take medicine as told by your doctor.   Do not take aspirin 1 week before or during your period. Aspirin can make the bleeding worse.   Lay down for a while if you change your tampon or pad more than once in 2 hours. This may help lessen the bleeding.   Take any iron pills as told by your doctor. Heavy bleeding may cause you to lack iron in your body.   Eat a healthy diet and foods with iron. These foods include leafy green vegetables, meat, liver, eggs, and whole grain breads and cereals.   Eat foods that are high in vitamin C. These include oranges, orange juice, and grapefruits. Vitamin C can help your body take in more iron.   Do not try to lose weight. Wait until the heavy bleeding has stopped and your iron level is normal.  GET HELP RIGHT AWAY IF:   You get a fever.   You have trouble breathing.   You bleed even more heavily than usual and pass blood clots.   You feel dizzy, weak, or pass out (faint).   You need to change your tampon or pad more than once an hour.   You feel sick to your stomach (nauseous), throw up (vomit), or have watery poop (diarrhea).   You have problems from medicine.  MAKE SURE YOU:    Understand these instructions.   Will watch your condition.   Will get help right away if you are not doing well or get worse.  Document Released: 04/24/2008 Document Revised: 07/05/2011 Document Reviewed: 04/24/2008  [employer] Patient Information 2012 [employer].

## 2012-01-11 ENCOUNTER — Ambulatory Visit: Payer: Self-pay | Admitting: Advanced Practice Midwife

## 2012-01-27 ENCOUNTER — Emergency Department (HOSPITAL_COMMUNITY): Payer: Self-pay

## 2012-01-27 ENCOUNTER — Emergency Department (HOSPITAL_COMMUNITY)
Admission: EM | Admit: 2012-01-27 | Discharge: 2012-01-27 | Disposition: A | Discharge status: Home / Self Care | Payer: Self-pay | Attending: Emergency Medicine | Admitting: Emergency Medicine

## 2012-01-27 DIAGNOSIS — J45901 Unspecified asthma with (acute) exacerbation: Secondary | ICD-10-CM

## 2012-01-27 DIAGNOSIS — Z9109 Other allergy status, other than to drugs and biological substances: Secondary | ICD-10-CM | POA: Insufficient documentation

## 2012-01-27 DIAGNOSIS — Z79899 Other long term (current) drug therapy: Secondary | ICD-10-CM | POA: Insufficient documentation

## 2012-01-27 MED ORDER — METHYLPREDNISOLONE SODIUM SUCC 125 MG IJ SOLR
125.0000 mg | Freq: Once | INTRAMUSCULAR | Status: AC
  Administered 2012-01-27: 125 mg via INTRAVENOUS
  Filled 2012-01-27: qty 2

## 2012-01-27 MED ORDER — EPINEPHRINE 0.3 MG/0.3ML IJ DEVI: 0.3000 mg | INTRAMUSCULAR | Status: DC | PRN

## 2012-01-27 MED ORDER — SODIUM CHLORIDE 0.9 % IV SOLN
INTRAVENOUS | Status: DC
  Administered 2012-01-27 (×2): via INTRAVENOUS

## 2012-01-27 MED ORDER — ALBUTEROL SULFATE (5 MG/ML) 0.5% IN NEBU
INHALATION_SOLUTION | RESPIRATORY_TRACT | Status: AC
  Filled 2012-01-27: qty 1

## 2012-01-27 MED ORDER — PREDNISONE (PAK) 10 MG PO TABS: 10.0000 mg | ORAL_TABLET | Freq: Every day | ORAL | Status: AC

## 2012-01-27 MED ORDER — ALBUTEROL SULFATE HFA 108 (90 BASE) MCG/ACT IN AERS
2.0000 | INHALATION_SPRAY | Freq: Four times a day (QID) | RESPIRATORY_TRACT | Status: DC
  Administered 2012-01-27: 2 via RESPIRATORY_TRACT
  Filled 2012-01-27: qty 6.7

## 2012-01-27 MED ORDER — SODIUM CHLORIDE 0.9 % IV BOLUS (SEPSIS)
1000.0000 mL | Freq: Once | INTRAVENOUS | Status: AC
  Administered 2012-01-27: 1000 mL via INTRAVENOUS

## 2012-01-27 MED ORDER — ALBUTEROL SULFATE (5 MG/ML) 0.5% IN NEBU
5.0000 mg | INHALATION_SOLUTION | Freq: Once | RESPIRATORY_TRACT | Status: AC
  Administered 2012-01-27: 5 mg via RESPIRATORY_TRACT
  Filled 2012-01-27: qty 1

## 2012-01-27 MED ORDER — IPRATROPIUM BROMIDE 0.02 % IN SOLN: 0.5000 mg | Freq: Once | RESPIRATORY_TRACT | Status: DC

## 2012-01-27 MED ORDER — ALBUTEROL SULFATE (5 MG/ML) 0.5% IN NEBU
5.0000 mg | INHALATION_SOLUTION | Freq: Once | RESPIRATORY_TRACT | Status: AC
  Administered 2012-01-27: 5 mg via RESPIRATORY_TRACT

## 2012-01-27 NOTE — ED Provider Notes (Signed)
Medical screening examination/treatment/procedure(s) were performed by non-physician practitioner and as supervising physician I was immediately available for consultation/collaboration.  Doug Sou, MD 01/27/12 [phone]

## 2012-01-27 NOTE — ED Provider Notes (Signed)
History     CSN: [id number]  Arrival date & time 01/27/12  2130   First MD Initiated Contact with Patient 01/27/12 1001      Chief Complaint  Patient presents with  . Wheezing    (Consider location/radiation/quality/duration/timing/severity/associated sxs/prior treatment) HPI Comments: Patient with hx asthma reports wheezing and shortness of breath that began last night that feels like her typical asthma attack.  Was using albuterol pump inhaler without improvement at home.  Pt does have sore throat. Pt was outside at graduation party where she was exposed to seasonal allergens which frequently set of her asthma attacks.  Denies fevers, cough, difficulty swallowing, rash, swelling in her mouth or throat.  Pt has been hospitalized previously for her asthma but never intubated.   Patient is a 41 y.o. female presenting with wheezing. The history is provided by the patient.  Wheezing  The current episode started yesterday. Associated symptoms include sore throat, shortness of breath and wheezing. Pertinent negatives include no fever and no cough.    Past Medical History  Diagnosis Date  . Asthma   . Seasonal allergies     Past Surgical History  Procedure Date  . Cholecystectomy   . Cesarean section   . Tubal ligation     Family History  Problem Relation Age of Onset  . Hypertension Mother   . Diabetes Mother   . Cancer Mother     History  Substance Use Topics  . Smoking status: Never Smoker   . Smokeless tobacco: Never Used  . Alcohol Use: No    OB History    Grav Para Term Preterm Abortions TAB SAB Ect Mult Living   5 4 4  0 1 0 1 0 0 3      Review of Systems  Constitutional: Negative for fever, chills and appetite change.  HENT: Positive for sore throat. Negative for trouble swallowing.   Respiratory: Positive for shortness of breath and wheezing. Negative for cough.   Gastrointestinal: Negative for nausea, vomiting, abdominal pain and diarrhea.    Allergies   Review of patient's allergies indicates no known allergies.  Home Medications   Current Outpatient Rx  Name Route Sig Dispense Refill  . ACETAMINOPHEN 500 MG PO TABS Oral Take 1,000 mg by mouth every 6 (six) hours as needed. For pain    . ALBUTEROL SULFATE HFA 108 (90 BASE) MCG/ACT IN AERS Inhalation Inhale 2 puffs into the lungs every 6 (six) hours as needed. Shortness of breath and wheezing    . ALBUTEROL SULFATE (2.5 MG/3ML) 0.083% IN NEBU Nebulization Take 3 mLs (2.5 mg total) by nebulization every 6 (six) hours as needed for wheezing or shortness of breath. 75 mL 12  . BECLOMETHASONE DIPROPIONATE 80 MCG/ACT IN AERS Inhalation Inhale 1 puff into the lungs 2 (two) times daily.    Marland Kitchen PREDNISONE 50 MG PO TABS Oral Take 1 tablet (50 mg total) by mouth daily. 7 tablet 0    BP 144/79  Pulse 77  Temp 98.5 F (36.9 C)  Resp 24  SpO2 100%  LMP 12/20/2011  Physical Exam  Nursing note and vitals reviewed. Constitutional: She is oriented to person, place, and time. She appears well-developed and well-nourished. No distress.  HENT:  Mouth/Throat: Uvula is midline and oropharynx is clear and moist. No uvula swelling. No oropharyngeal exudate, posterior oropharyngeal edema or tonsillar abscesses.  Neck: Trachea normal, normal range of motion and phonation normal. Neck supple. No tracheal tenderness present. No rigidity. No tracheal deviation and  normal range of motion present.  Cardiovascular: Normal rate and regular rhythm.   Pulmonary/Chest: Effort normal. No stridor. No respiratory distress. She has decreased breath sounds. She has wheezes. She has no rhonchi. She has no rales.  Neurological: She is alert and oriented to person, place, and time.  Skin: She is not diaphoretic.    ED Course  Procedures (including critical care time)  Labs Reviewed - No data to display Dg Chest 2 View  01/27/2012  *RADIOLOGY REPORT*  Clinical Data: shortness of breath and right-sided chest pain  CHEST - 2  VIEW  Comparison: 12/03/2011  Findings: The heart size and mediastinal contours are within normal limits.  Both lungs are clear.  The visualized skeletal structures are unremarkable.  IMPRESSION: Negative exam.  Original Report Authenticated By: Rosealee Albee, M.D.    10:25 AM Pt seen and examined, breathing treatment ordered from exam room.  Respiratory tech is in the room currently giving ordered breathing treatment.    11:29 AM Great improvement after first nebulizer treatment.  Lungs CTAB but still limited air movement and increased work of breathing.  Wheezing has resolved.  Pt reports improvement, notes she is feeling generalized weakness.  I suspect this is from the increased work of breathing and not eating or drinking all night and today because of her asthma.    12:33 PM Patient improving clinically, lungs CTAB, moving air well in all fields.  O2 sat remains high 100% on room air.  Pt continues to have subjective SOB and tachypnea.  Will add CXR.     Date: 01/27/2012  Rate: 72  Rhythm: normal sinus rhythm  QRS Axis: borderline left axis  Intervals: normal  ST/T Wave abnormalities: normal  Conduction Disutrbances:none  Narrative Interpretation:   Old EKG Reviewed: unchanged  1:44 PM Patient reports improvement, no longer tachypneic, ready to go home.  Feeling less weak after eating and IVF.    1. Asthma attack       MDM  Afebrile nontoxic patient with hx asthma presents with wheezing and SOB, consistent with asthma attack.  Home albuterol pump has not been helping.  Pt improved with nebulizer treatments x 2, steroids.  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  Return precautions given.  Patient verbalizes understanding and agrees with plan.          Dillard Cannon [place], [place] 01/27/12 1541

## 2012-01-27 NOTE — ED Notes (Signed)
Discharge instructions reviewed w/ pt., verbalizes understanding. Understands how to use inhaler and importance of getting epi pen prescription filled.

## 2012-01-27 NOTE — Progress Notes (Signed)
BBS post neb treatment- no wheeze heard, slightly diminished in the bases, but mostly clear. Pt states breathing is easier, doesn't feel as tight. Audible wheeze heard prior to neb. No audible wheezing at this time. RT will continue to monitor.

## 2012-01-27 NOTE — ED Notes (Signed)
Audible wheezing on arrival-- hx asthma

## 2012-01-27 NOTE — Discharge Instructions (Signed)
Read the information below.  Please follow up with your doctor at [facility].  Use the medication as directed.  If you have worsening shortness of breath or any new cough, fever, or chest pain, return to the ER immediately for a recheck.  You may return to the ER at any time for worsening condition or any new symptoms that concern you.   Asthma Attack Prevention HOW CAN ASTHMA BE PREVENTED? Currently, there is no way to prevent asthma from starting. However, you can take steps to control the disease and prevent its symptoms after you have been diagnosed. Learn about your asthma and how to control it. Take an active role to control your asthma by working with your caregiver to create and follow an asthma action plan. An asthma action plan guides you in taking your medicines properly, avoiding factors that make your asthma worse, tracking your level of asthma control, responding to worsening asthma, and seeking emergency care when needed. To track your asthma, keep records of your symptoms, check your peak flow number using a peak flow meter (handheld device that shows how well air moves out of your lungs), and get regular asthma checkups.  Other ways to prevent asthma attacks include:  Use medicines as your caregiver directs.   Identify and avoid things that make your asthma worse (as much as you can).   Keep track of your asthma symptoms and level of control.   Get regular checkups for your asthma.   With your caregiver, write a detailed plan for taking medicines and managing an asthma attack. Then be sure to follow your action plan. Asthma is an ongoing condition that needs regular monitoring and treatment.   Identify and avoid asthma triggers. A number of outdoor allergens and irritants (pollen, mold, cold air, air pollution) can trigger asthma attacks. Find out what causes or makes your asthma worse, and take steps to avoid those triggers (see below).   Monitor your breathing. Learn to  recognize warning signs of an attack, such as slight coughing, wheezing or shortness of breath. However, your lung function may already decrease before you notice any signs or symptoms, so regularly measure and record your peak airflow with a home peak flow meter.   Identify and treat attacks early. If you act quickly, you're less likely to have a severe attack. You will also need less medicine to control your symptoms. When your peak flow measurements decrease and alert you to an upcoming attack, take your medicine as instructed, and immediately stop any activity that may have triggered the attack. If your symptoms do not improve, get medical help.   Pay attention to increasing quick-relief inhaler use. If you find yourself relying on your quick-relief inhaler (such as albuterol), your asthma is not under control. See your caregiver about adjusting your treatment.  IDENTIFY AND CONTROL FACTORS THAT MAKE YOUR ASTHMA WORSE A number of common things can set off or make your asthma symptoms worse (asthma triggers). Keep track of your asthma symptoms for several weeks, detailing all the environmental and emotional factors that are linked with your asthma. When you have an asthma attack, go back to your asthma diary to see which factor, or combination of factors, might have contributed to it. Once you know what these factors are, you can take steps to control many of them.  Allergies: If you have allergies and asthma, it is important to take asthma prevention steps at home. Asthma attacks (worsening of asthma symptoms) can be triggered by  allergies, which can cause temporary increased inflammation of your airways. Minimizing contact with the substance to which you are allergic will help prevent an asthma attack. Animal Dander:   Some people are allergic to the flakes of skin or dried saliva from animals with fur or feathers. Keep these pets out of your home.   If you can't keep a pet outdoors, keep the pet  out of your bedroom and other sleeping areas at all times, and keep the door closed.   Remove carpets and furniture covered with cloth from your home. If that is not possible, keep the pet away from fabric-covered furniture and carpets.  Dust Mites:  Many people with asthma are allergic to dust mites. Dust mites are tiny bugs that are found in every home, in mattresses, pillows, carpets, fabric-covered furniture, bedcovers, clothes, stuffed toys, fabric, and other fabric-covered items.   Cover your mattress in a special dust-proof cover.   Cover your pillow in a special dust-proof cover, or wash the pillow each week in hot water. Water must be hotter than 130 F to kill dust mites. Cold or warm water used with detergent and bleach can also be effective.   Wash the sheets and blankets on your bed each week in hot water.   Try not to sleep or lie on cloth-covered cushions.   Call ahead when traveling and ask for a smoke-free hotel room. Bring your own bedding and pillows, in case the hotel only supplies feather pillows and down comforters, which may contain dust mites and cause asthma symptoms.   Remove carpets from your bedroom and those laid on concrete, if you can.   Keep stuffed toys out of the bed, or wash the toys weekly in hot water or cooler water with detergent and bleach.  Cockroaches:  Many people with asthma are allergic to the droppings and remains of cockroaches.   Keep food and garbage in closed containers. Never leave food out.   Use poison baits, traps, powders, gels, or paste (for example, boric acid).   If a spray is used to kill cockroaches, stay out of the room until the odor goes away.  Indoor Mold:  Fix leaky faucets, pipes, or other sources of water that have mold around them.   Clean moldy surfaces with a cleaner that has bleach in it.  Pollen and Outdoor Mold:  When pollen or mold spore counts are high, try to keep your windows closed.   Stay indoors with  windows closed from late morning to afternoon, if you can. Pollen and some mold spore counts are highest at that time.   Ask your caregiver whether you need to take or increase anti-inflammatory medicine before your allergy season starts.  Irritants:   Tobacco smoke is an irritant. If you smoke, ask your caregiver how you can quit. Ask family members to quit smoking, too. Do not allow smoking in your home or car.   If possible, do not use a wood-burning stove, kerosene heater, or fireplace. Minimize exposure to all sources of smoke, including incense, candles, fires, and fireworks.   Try to stay away from strong odors and sprays, such as perfume, talcum powder, hair spray, and paints.   Decrease humidity in your home and use an indoor air cleaning device. Reduce indoor humidity to below 60 percent. Dehumidifiers or central air conditioners can do this.   Try to have someone else vacuum for you once or twice a week, if you can. Stay out of rooms while  they are being vacuumed and for a short while afterward.   If you vacuum, use a dust mask from a hardware store, a double-layered or microfilter vacuum cleaner bag, or a vacuum cleaner with a HEPA filter.   Sulfites in foods and beverages can be irritants. Do not drink beer or wine, or eat dried fruit, processed potatoes, or shrimp if they cause asthma symptoms.   Cold air can trigger an asthma attack. Cover your nose and mouth with a scarf on cold or windy days.   Several health conditions can make asthma more difficult to manage, including runny nose, sinus infections, reflux disease, psychological stress, and sleep apnea. Your caregiver will treat these conditions, as well.   Avoid close contact with people who have a cold or the flu, since your asthma symptoms may get worse if you catch the infection from them. Wash your hands thoroughly after touching items that may have been handled by people with a respiratory infection.   Get a flu shot  every year to protect against the flu virus, which often makes asthma worse for days or weeks. Also get a pneumonia shot once every five to 10 years.  Drugs:  Aspirin and other painkillers can cause asthma attacks. 10% to 20% of people with asthma have sensitivity to aspirin or a group of painkillers called non-steroidal anti-inflammatory drugs (NSAIDS), such as ibuprofen and naproxen. These drugs are used to treat pain and reduce fevers. Asthma attacks caused by any of these medicines can be severe and even fatal. These drugs must be avoided in people who have known aspirin sensitive asthma. Products with acetaminophen are considered safe for people who have asthma. It is important that people with aspirin sensitivity read labels of all over-the-counter drugs used to treat pain, colds, coughs, and fever.   Beta blockers and ACE inhibitors are other drugs which you should discuss with your caregiver, in relation to your asthma.  ALLERGY SKIN TESTING  Ask your asthma caregiver about allergy skin testing or blood testing (RAST test) to identify the allergens to which you are sensitive. If you are found to have allergies, allergy shots (immunotherapy) for asthma may help prevent future allergies and asthma. With allergy shots, small doses of allergens (substances to which you are allergic) are injected under your skin on a regular schedule. Over a period of time, your body may become used to the allergen and less responsive with asthma symptoms. You can also take measures to minimize your exposure to those allergens. EXERCISE  If you have exercise-induced asthma, or are planning vigorous exercise, or exercise in cold, humid, or dry environments, prevent exercise-induced asthma by following your caregiver's advice regarding asthma treatment before exercising. Document Released: 07/04/2009 Document Revised: 07/05/2011 Document Reviewed: 07/04/2009 [facility] Patient Information 2012 [place], [name].  Asthma,  Adult Asthma is a disease of the lungs and can make it hard to breathe. Asthma cannot be cured, but medicine can help control it. Asthma may be started (triggered) by:  Pollen.   Dust.   Animal skin flakes (dander).   Molds.   Foods.   Respiratory infections (colds, flu).   Smoke.   Exercise.   Stress.   Other things that cause allergic reactions or allergies (allergens).  HOME CARE   Talk to your doctor about how to manage your attacks at home. This may include:   Using a tool called a peak flow meter.   Having medicine ready to stop the attack.   Take all medicine as  told by your doctor.   Wash bed sheets and blankets every week in hot water and put them in the dryer.   Drink enough fluids to keep your pee (urine) clear or pale yellow.   Always be ready to get emergency help. Write down the phone number for your doctor. Keep it where you can easily find it.   Talk about exercise routines with your doctor.   If animal dander is causing your asthma, you may need to find a new home for your pet(s).  GET HELP RIGHT AWAY IF:   You have muscle aches.   You cough more.   You have chest pain.   You have thick spit (sputum) that changes to yellow, green, gray, or bloody.   Medicine does not stop your wheezing.   You have problems breathing.   You have a fever.   Your medicine causes:   A rash.   Itching.   Puffiness (swelling).   Breathing problems.  MAKE SURE YOU:   Understand these instructions.   Will watch your condition.   Will get help right away if you are not doing well or get worse.  Document Released: 01/02/2008 Document Revised: 07/05/2011 Document Reviewed: 05/26/2008 [facility] Patient Information 2012 [place], [name].

## 2012-02-14 ENCOUNTER — Encounter (HOSPITAL_COMMUNITY): Payer: Self-pay | Admitting: Emergency Medicine

## 2012-02-14 ENCOUNTER — Emergency Department (HOSPITAL_COMMUNITY)
Admission: EM | Admit: 2012-02-14 | Discharge: 2012-02-14 | Disposition: A | Discharge status: Home / Self Care | Payer: Self-pay | Attending: Emergency Medicine | Admitting: Emergency Medicine

## 2012-02-14 DIAGNOSIS — Z8739 Personal history of other diseases of the musculoskeletal system and connective tissue: Secondary | ICD-10-CM | POA: Insufficient documentation

## 2012-02-14 DIAGNOSIS — J45909 Unspecified asthma, uncomplicated: Secondary | ICD-10-CM | POA: Insufficient documentation

## 2012-02-14 DIAGNOSIS — S90821A Blister (nonthermal), right foot, initial encounter: Secondary | ICD-10-CM

## 2012-02-14 DIAGNOSIS — Z9109 Other allergy status, other than to drugs and biological substances: Secondary | ICD-10-CM | POA: Insufficient documentation

## 2012-02-14 DIAGNOSIS — IMO0002 Reserved for concepts with insufficient information to code with codable children: Secondary | ICD-10-CM | POA: Insufficient documentation

## 2012-02-14 DIAGNOSIS — X58XXXA Exposure to other specified factors, initial encounter: Secondary | ICD-10-CM | POA: Insufficient documentation

## 2012-02-14 HISTORY — DX: Unspecified osteoarthritis, unspecified site: M19.90

## 2012-02-14 MED ORDER — TRAMADOL HCL 50 MG PO TABS: 50.0000 mg | ORAL_TABLET | Freq: Four times a day (QID) | ORAL | Status: AC | PRN

## 2012-02-14 MED ORDER — OXYCODONE-ACETAMINOPHEN 5-325 MG PO TABS
1.0000 | ORAL_TABLET | Freq: Once | ORAL | Status: AC
  Administered 2012-02-14: 1 via ORAL
  Filled 2012-02-14: qty 1

## 2012-02-14 NOTE — ED Notes (Signed)
Blister on side of right foot, started 1 1/2 weeks ago. Swelling in ankle also. Has some bug bites on ankles.

## 2012-02-14 NOTE — ED Provider Notes (Signed)
History     CSN: [id number]  Arrival date & time 02/14/12  1634   First MD Initiated Contact with Patient 02/14/12 1707      Chief Complaint  Patient presents with  . blister on foot     (Consider location/radiation/quality/duration/timing/severity/associated sxs/prior treatment) The history is provided by the patient.   41 y/o female INAd c/o small clear liquids filled blisters to right foot that are pruritic x1 month. 2 days ago a large blister that is painful erupted to right lateral foot. She also has painful swelling above the right lateral malleolus. Denies prior episodes, fever, trauma.   Past Medical History  Diagnosis Date  . Asthma   . Seasonal allergies   . Arthritis     Past Surgical History  Procedure Date  . Cholecystectomy   . Cesarean section   . Tubal ligation     Family History  Problem Relation Age of Onset  . Hypertension Mother   . Diabetes Mother   . Cancer Mother     History  Substance Use Topics  . Smoking status: Former Games developer  . Smokeless tobacco: Never Used  . Alcohol Use: No    OB History    Grav Para Term Preterm Abortions TAB SAB Ect Mult Living   5 4 4  0 1 0 1 0 0 3      Review of Systems  Allergies  Review of patient's allergies indicates no known allergies.  Home Medications   Current Outpatient Rx  Name Route Sig Dispense Refill  . ACETAMINOPHEN 500 MG PO TABS Oral Take 1,000 mg by mouth every 6 (six) hours as needed. For pain    . ALBUTEROL SULFATE HFA 108 (90 BASE) MCG/ACT IN AERS Inhalation Inhale 2 puffs into the lungs every 6 (six) hours as needed. Shortness of breath and wheezing    . ALBUTEROL SULFATE (2.5 MG/3ML) 0.083% IN NEBU Nebulization Take 3 mLs (2.5 mg total) by nebulization every 6 (six) hours as needed for wheezing or shortness of breath. 75 mL 12  . BECLOMETHASONE DIPROPIONATE 80 MCG/ACT IN AERS Inhalation Inhale 1 puff into the lungs 2 (two) times daily.    Marland Kitchen EPINEPHRINE 0.3 MG/0.3ML IJ DEVI  Intramuscular Inject 0.3 mLs (0.3 mg total) into the muscle as needed. 1 Device 1    BP 146/94  Pulse 70  Temp 98.7 F (37.1 C) (Oral)  Resp 20  SpO2 100%  LMP 02/14/2012  Physical Exam  Vitals reviewed. Constitutional: She is oriented to person, place, and time. She appears well-developed and well-nourished. No distress.  HENT:  Head: Normocephalic.  Eyes: Conjunctivae and EOM are normal.  Cardiovascular: Normal rate.   Pulmonary/Chest: Effort normal.  Musculoskeletal: Normal range of motion.       2x large blisters to lateral right heel 1.5 cm and 1 cm. No erythema, redness or induration. Tender to palpation.  Pt has 10x 5 cm area of firm, tender, swelling to distal anterior leg proximal to lateral malleolus. Also no warmth. No calf tenderness,  calf asymmetry or superficial collaterals.  .   Neurological: She is alert and oriented to person, place, and time.  Psychiatric: She has a normal mood and affect.    ED Course  Procedures (including critical care time)  Labs Reviewed - No data to display No results found.   1. Blister of foot, right       MDM  41 y/o female with painful large blisters to right foot with anterior calf swelling and  tenderness. Shared Visit with Attending Dr. Golda Acre who recommends pain control and referral to podiatry.  Pt verbalized understanding and agrees with care plan. Outpatient follow-up and return precautions given.           Wynetta Emery, PA-C 02/14/12 1802

## 2012-02-14 NOTE — ED Notes (Signed)
Pt stated that she noted a week ago the blisters that were on her right foot when she got off work. Pt stated that her right foot and ankle has been swelling. Pt stated "I tried Lotrimin cream to see if it would help and it didn't help." Pt c/o pain at site 8/10.

## 2012-02-14 NOTE — ED Provider Notes (Signed)
Medical screening examination/treatment/procedure(s) were performed by non-physician practitioner and as supervising physician I was immediately available for consultation/collaboration.   Nichlos Kunzler A Deauna Yaw, MD 02/14/12 2335 

## 2012-02-18 ENCOUNTER — Emergency Department (HOSPITAL_COMMUNITY)
Admission: EM | Admit: 2012-02-18 | Discharge: 2012-02-18 | Disposition: A | Discharge status: Home / Self Care | Payer: Self-pay | Attending: Emergency Medicine | Admitting: Emergency Medicine

## 2012-02-18 ENCOUNTER — Emergency Department (HOSPITAL_COMMUNITY): Payer: Self-pay

## 2012-02-18 ENCOUNTER — Encounter (HOSPITAL_COMMUNITY): Payer: Self-pay | Admitting: Emergency Medicine

## 2012-02-18 DIAGNOSIS — J45909 Unspecified asthma, uncomplicated: Secondary | ICD-10-CM | POA: Insufficient documentation

## 2012-02-18 DIAGNOSIS — A499 Bacterial infection, unspecified: Secondary | ICD-10-CM | POA: Insufficient documentation

## 2012-02-18 DIAGNOSIS — L989 Disorder of the skin and subcutaneous tissue, unspecified: Secondary | ICD-10-CM | POA: Insufficient documentation

## 2012-02-18 DIAGNOSIS — S90426A Blister (nonthermal), unspecified lesser toe(s), initial encounter: Secondary | ICD-10-CM | POA: Insufficient documentation

## 2012-02-18 DIAGNOSIS — L089 Local infection of the skin and subcutaneous tissue, unspecified: Secondary | ICD-10-CM | POA: Insufficient documentation

## 2012-02-18 DIAGNOSIS — B9689 Other specified bacterial agents as the cause of diseases classified elsewhere: Secondary | ICD-10-CM | POA: Insufficient documentation

## 2012-02-18 DIAGNOSIS — X58XXXA Exposure to other specified factors, initial encounter: Secondary | ICD-10-CM | POA: Insufficient documentation

## 2012-02-18 MED ORDER — CEPHALEXIN 250 MG PO CAPS: 250.0000 mg | ORAL_CAPSULE | Freq: Four times a day (QID) | ORAL | Status: AC

## 2012-02-18 NOTE — ED Notes (Signed)
Here 2 days ago for large blister on right foot. Pt reports noticed odor and drainage. Has smaller blister now.

## 2012-02-18 NOTE — ED Provider Notes (Signed)
History     CSN: [id number]  Arrival date & time 02/18/12  1727   First MD Initiated Contact with Patient 02/18/12 1757      Chief Complaint  Patient presents with  . Wound Check    (Consider location/radiation/quality/duration/timing/severity/associated sxs/prior treatment) HPI Comments: Pt seen 2 days ago for blister to R foot. Reports noticing increased odor and drainage from the area. Denies fever or chills. Was referred to f/u with podiatry but has been unable to secure f/u appt yet.   Patient is a 41 y.o. female presenting with wound check. The history is provided by the patient.  Wound Check  She was treated in the ED 2 to 3 days ago. Previous treatment in the ED includes wound cleansing or irrigation. Treatments since wound repair include regular soap and water washings. Fever duration: none. There has been clear discharge from the wound. There is no redness present. There is no swelling present. The pain has not changed. There is difficulty moving the extremity or digit due to pain.    Past Medical History  Diagnosis Date  . Asthma   . Seasonal allergies   . Arthritis     Past Surgical History  Procedure Date  . Cholecystectomy   . Cesarean section   . Tubal ligation     Family History  Problem Relation Age of Onset  . Hypertension Mother   . Diabetes Mother   . Cancer Mother     History  Substance Use Topics  . Smoking status: Former Games developer  . Smokeless tobacco: Never Used  . Alcohol Use: No    OB History    Grav Para Term Preterm Abortions TAB SAB Ect Mult Living   5 4 4  0 1 0 1 0 0 3      Review of Systems as per HPI  Allergies  Review of patient's allergies indicates no known allergies.  Home Medications   Current Outpatient Rx  Name Route Sig Dispense Refill  . ACETAMINOPHEN 500 MG PO TABS Oral Take 1,000 mg by mouth every 6 (six) hours as needed. For pain    . ALBUTEROL SULFATE HFA 108 (90 BASE) MCG/ACT IN AERS Inhalation Inhale 2 puffs  into the lungs every 6 (six) hours as needed. Shortness of breath and wheezing    . ALBUTEROL SULFATE (2.5 MG/3ML) 0.083% IN NEBU Nebulization Take 3 mLs (2.5 mg total) by nebulization every 6 (six) hours as needed for wheezing or shortness of breath. 75 mL 12  . BECLOMETHASONE DIPROPIONATE 80 MCG/ACT IN AERS Inhalation Inhale 1 puff into the lungs 2 (two) times daily.    Marland Kitchen EPINEPHRINE 0.3 MG/0.3ML IJ DEVI Intramuscular Inject 0.3 mLs (0.3 mg total) into the muscle as needed. 1 Device 1  . TRAMADOL HCL 50 MG PO TABS Oral Take 1 tablet (50 mg total) by mouth every 6 (six) hours as needed for pain. 15 tablet 0    BP 156/96  Pulse 69  Temp 99 F (37.2 C) (Oral)  Resp 18  SpO2 96%  LMP 02/14/2012  Physical Exam  Nursing note and vitals reviewed. Constitutional: She appears well-developed and well-nourished. No distress.  HENT:  Head: Normocephalic and atraumatic.  Neck: Normal range of motion.  Cardiovascular: Normal rate.   Pulmonary/Chest: Effort normal.  Musculoskeletal: Normal range of motion.  Neurological: She is alert.  Skin: Skin is warm and dry. She is not diaphoretic.       2 blisters noted to right lateral foot. There is no  calf swelling or tenderness today. Mild amount of serous drainage. No overt erythema seen although the patient is very tender to palpation of the area.  Psychiatric: She has a normal mood and affect.    ED Course  Procedures (including critical care time)   Labs Reviewed  GLUCOSE, CAPILLARY  LAB REPORT - SCANNED   Dg Foot Complete Right  02/18/2012  *RADIOLOGY REPORT*  Clinical Data: Blister on lateral foot  RIGHT FOOT COMPLETE - 3+ VIEW  Comparison: None.  Findings: Negative for fracture.  Negative for osteomyelitis.  Soft tissue defect in the lateral foot.  Negative for foreign body.  IMPRESSION: No significant bony abnormality.  Original Report Authenticated By: Camelia Phenes, M.D.     1. Superficial bacterial infection of skin       MDM    Patient presents for wound recheck. Reports that she has had increased odor and drainage from the area. Will give Keflex for coverage. Patient has no evidence of osteomyelitis per x-ray. Random blood glucose is within normal limits. Patient advised on continued wound care and to make a followup with podiatry. Reasons to return discussed.       Grant Fontana, PA-C 02/20/12 [phone]

## 2012-02-18 NOTE — ED Notes (Signed)
Pt verbalizes understanding 

## 2012-02-20 NOTE — ED Provider Notes (Signed)
Medical screening examination/treatment/procedure(s) were performed by non-physician practitioner and as supervising physician I was immediately available for consultation/collaboration.  Gerhard Munch, MD 02/20/12 [phone]

## 2012-02-27 ENCOUNTER — Encounter: Payer: Self-pay | Admitting: Physician Assistant

## 2012-03-12 ENCOUNTER — Encounter (HOSPITAL_COMMUNITY): Payer: Self-pay | Admitting: *Deleted

## 2012-03-12 ENCOUNTER — Inpatient Hospital Stay (HOSPITAL_COMMUNITY)
Admission: EM | Admit: 2012-03-12 | Discharge: 2012-03-15 | DRG: 189 | Disposition: A | Discharge status: Home / Self Care | Payer: MEDICAID | Attending: Internal Medicine | Admitting: Internal Medicine

## 2012-03-12 ENCOUNTER — Emergency Department (HOSPITAL_COMMUNITY): Payer: Self-pay

## 2012-03-12 DIAGNOSIS — J45901 Unspecified asthma with (acute) exacerbation: Secondary | ICD-10-CM | POA: Diagnosis present

## 2012-03-12 DIAGNOSIS — E669 Obesity, unspecified: Secondary | ICD-10-CM | POA: Diagnosis present

## 2012-03-12 DIAGNOSIS — L02619 Cutaneous abscess of unspecified foot: Secondary | ICD-10-CM | POA: Diagnosis present

## 2012-03-12 DIAGNOSIS — R0789 Other chest pain: Secondary | ICD-10-CM | POA: Diagnosis present

## 2012-03-12 DIAGNOSIS — R030 Elevated blood-pressure reading, without diagnosis of hypertension: Secondary | ICD-10-CM | POA: Diagnosis present

## 2012-03-12 DIAGNOSIS — J9600 Acute respiratory failure, unspecified whether with hypoxia or hypercapnia: Principal | ICD-10-CM | POA: Diagnosis present

## 2012-03-12 LAB — BASIC METABOLIC PANEL
CO2: 19 mEq/L (ref 19–32)
Calcium: 8.9 mg/dL (ref 8.4–10.5)
Creatinine, Ser: 0.69 mg/dL (ref 0.50–1.10)
GFR calc Af Amer: >90 mL/min (ref >90)

## 2012-03-12 LAB — BLOOD GAS, ARTERIAL
Bicarbonate: 23.3 mEq/L (ref 20.0–24.0)
O2 Saturation: 97.2 %
Patient temperature: 98.6
TCO2: 21.2 mmol/L (ref 0–100)

## 2012-03-12 LAB — CBC
MCH: 29.6 pg (ref 26.0–34.0)
MCV: 87.6 fL (ref 78.0–100.0)
Platelets: 258 10*3/uL (ref 150–400)
RDW: 14 % (ref 11.5–15.5)

## 2012-03-12 MED ORDER — IPRATROPIUM BROMIDE 0.02 % IN SOLN
0.5000 mg | Freq: Once | RESPIRATORY_TRACT | Status: AC
  Administered 2012-03-12: 0.5 mg via RESPIRATORY_TRACT

## 2012-03-12 MED ORDER — METHYLPREDNISOLONE SODIUM SUCC 125 MG IJ SOLR
80.0000 mg | Freq: Four times a day (QID) | INTRAMUSCULAR | Status: DC
  Administered 2012-03-12 – 2012-03-13 (×4): 80 mg via INTRAVENOUS
  Filled 2012-03-12 (×2): qty 1.28
  Filled 2012-03-12: qty 2
  Filled 2012-03-12 (×5): qty 1.28

## 2012-03-12 MED ORDER — PANTOPRAZOLE SODIUM 40 MG PO TBEC
40.0000 mg | DELAYED_RELEASE_TABLET | Freq: Every day | ORAL | Status: DC
  Administered 2012-03-12 – 2012-03-15 (×4): 40 mg via ORAL
  Filled 2012-03-12 (×4): qty 1

## 2012-03-12 MED ORDER — ALUM & MAG HYDROXIDE-SIMETH 200-200-20 MG/5ML PO SUSP: 30.0000 mL | Freq: Four times a day (QID) | ORAL | Status: DC | PRN

## 2012-03-12 MED ORDER — POLYETHYLENE GLYCOL 3350 17 G PO PACK
17.0000 g | PACK | Freq: Every day | ORAL | Status: DC | PRN
  Administered 2012-03-12: 17 g via ORAL
  Filled 2012-03-12: qty 1

## 2012-03-12 MED ORDER — HYDROCOD POLST-CHLORPHEN POLST 10-8 MG/5ML PO LQCR
5.0000 mL | Freq: Two times a day (BID) | ORAL | Status: DC
  Administered 2012-03-12 – 2012-03-15 (×6): 5 mL via ORAL
  Filled 2012-03-12 (×6): qty 5

## 2012-03-12 MED ORDER — MORPHINE SULFATE 2 MG/ML IJ SOLN
2.0000 mg | Freq: Once | INTRAMUSCULAR | Status: AC
  Administered 2012-03-12: 2 mg via INTRAVENOUS
  Filled 2012-03-12: qty 1

## 2012-03-12 MED ORDER — ENOXAPARIN SODIUM 40 MG/0.4ML ~~LOC~~ SOLN
40.0000 mg | SUBCUTANEOUS | Status: DC
  Administered 2012-03-12 – 2012-03-14 (×3): 40 mg via SUBCUTANEOUS
  Filled 2012-03-12 (×4): qty 0.4

## 2012-03-12 MED ORDER — ALBUTEROL SULFATE (5 MG/ML) 0.5% IN NEBU: 5.0000 mg | INHALATION_SOLUTION | RESPIRATORY_TRACT | Status: DC | PRN

## 2012-03-12 MED ORDER — ALBUTEROL SULFATE (5 MG/ML) 0.5% IN NEBU
INHALATION_SOLUTION | RESPIRATORY_TRACT | Status: AC
  Administered 2012-03-12: 5 mg via RESPIRATORY_TRACT
  Filled 2012-03-12: qty 1

## 2012-03-12 MED ORDER — IPRATROPIUM BROMIDE 0.02 % IN SOLN
0.5000 mg | RESPIRATORY_TRACT | Status: DC
  Administered 2012-03-12 (×2): 0.5 mg via RESPIRATORY_TRACT
  Filled 2012-03-12 (×2): qty 2.5

## 2012-03-12 MED ORDER — IPRATROPIUM BROMIDE 0.02 % IN SOLN
RESPIRATORY_TRACT | Status: AC
  Administered 2012-03-12: 0.5 mg via RESPIRATORY_TRACT
  Filled 2012-03-12: qty 2.5

## 2012-03-12 MED ORDER — ALBUTEROL SULFATE (5 MG/ML) 0.5% IN NEBU
2.5000 mg | INHALATION_SOLUTION | Freq: Once | RESPIRATORY_TRACT | Status: AC
  Administered 2012-03-12: 2.5 mg via RESPIRATORY_TRACT
  Filled 2012-03-12: qty 0.5

## 2012-03-12 MED ORDER — DEXTROSE 5 % IV SOLN
500.0000 mg | INTRAVENOUS | Status: DC
  Administered 2012-03-12: 500 mg via INTRAVENOUS
  Filled 2012-03-12 (×2): qty 500

## 2012-03-12 MED ORDER — ONDANSETRON HCL 4 MG PO TABS: 4.0000 mg | ORAL_TABLET | Freq: Four times a day (QID) | ORAL | Status: DC | PRN

## 2012-03-12 MED ORDER — ONDANSETRON HCL 4 MG/2ML IJ SOLN: 4.0000 mg | Freq: Four times a day (QID) | INTRAMUSCULAR | Status: DC | PRN

## 2012-03-12 MED ORDER — MORPHINE SULFATE 4 MG/ML IJ SOLN
4.0000 mg | Freq: Once | INTRAMUSCULAR | Status: AC
  Administered 2012-03-12: 4 mg via INTRAVENOUS

## 2012-03-12 MED ORDER — MORPHINE SULFATE 4 MG/ML IJ SOLN
INTRAMUSCULAR | Status: AC
  Filled 2012-03-12: qty 1

## 2012-03-12 MED ORDER — ALBUTEROL SULFATE (5 MG/ML) 0.5% IN NEBU
10.0000 mg | INHALATION_SOLUTION | Freq: Once | RESPIRATORY_TRACT | Status: AC
  Administered 2012-03-12: 10 mg via RESPIRATORY_TRACT

## 2012-03-12 MED ORDER — BENZONATATE 100 MG PO CAPS
100.0000 mg | ORAL_CAPSULE | Freq: Three times a day (TID) | ORAL | Status: DC
  Administered 2012-03-12 – 2012-03-15 (×11): 100 mg via ORAL
  Filled 2012-03-12 (×12): qty 1

## 2012-03-12 MED ORDER — METHYLPREDNISOLONE SODIUM SUCC 125 MG IJ SOLR
125.0000 mg | Freq: Once | INTRAMUSCULAR | Status: AC
  Administered 2012-03-12: 125 mg via INTRAVENOUS
  Filled 2012-03-12: qty 2

## 2012-03-12 MED ORDER — ALBUTEROL SULFATE (5 MG/ML) 0.5% IN NEBU
2.5000 mg | INHALATION_SOLUTION | RESPIRATORY_TRACT | Status: DC
  Administered 2012-03-12 (×2): 2.5 mg via RESPIRATORY_TRACT
  Filled 2012-03-12 (×2): qty 0.5

## 2012-03-12 MED ORDER — SODIUM CHLORIDE 0.45 % IV SOLN
INTRAVENOUS | Status: AC
  Administered 2012-03-12 (×2): via INTRAVENOUS

## 2012-03-12 MED ORDER — ZOLPIDEM TARTRATE 5 MG PO TABS
5.0000 mg | ORAL_TABLET | Freq: Once | ORAL | Status: AC
  Administered 2012-03-12: 5 mg via ORAL
  Filled 2012-03-12: qty 1

## 2012-03-12 MED ORDER — ALBUTEROL SULFATE (5 MG/ML) 0.5% IN NEBU
5.0000 mg | INHALATION_SOLUTION | Freq: Once | RESPIRATORY_TRACT | Status: AC
  Administered 2012-03-12: 5 mg via RESPIRATORY_TRACT

## 2012-03-12 MED ORDER — ACETAMINOPHEN 325 MG PO TABS: 650.0000 mg | ORAL_TABLET | Freq: Four times a day (QID) | ORAL | Status: DC | PRN

## 2012-03-12 MED ORDER — ACETAMINOPHEN 650 MG RE SUPP: 650.0000 mg | Freq: Four times a day (QID) | RECTAL | Status: DC | PRN

## 2012-03-12 MED ORDER — OXYCODONE HCL 5 MG PO TABS
5.0000 mg | ORAL_TABLET | ORAL | Status: DC | PRN
  Administered 2012-03-12 – 2012-03-15 (×7): 5 mg via ORAL
  Filled 2012-03-12 (×7): qty 1

## 2012-03-12 MED ORDER — MORPHINE SULFATE 4 MG/ML IJ SOLN: 4.0000 mg | Freq: Once | INTRAMUSCULAR | Status: DC

## 2012-03-12 MED ORDER — IPRATROPIUM BROMIDE 0.02 % IN SOLN
0.5000 mg | Freq: Four times a day (QID) | RESPIRATORY_TRACT | Status: DC
  Administered 2012-03-13 (×4): 0.5 mg via RESPIRATORY_TRACT
  Filled 2012-03-12 (×4): qty 2.5

## 2012-03-12 MED ORDER — SENNOSIDES-DOCUSATE SODIUM 8.6-50 MG PO TABS
2.0000 | ORAL_TABLET | Freq: Every evening | ORAL | Status: DC | PRN
  Administered 2012-03-12: 2 via ORAL
  Filled 2012-03-12: qty 2

## 2012-03-12 MED ORDER — FLUTICASONE PROPIONATE HFA 44 MCG/ACT IN AERO
2.0000 | INHALATION_SPRAY | Freq: Two times a day (BID) | RESPIRATORY_TRACT | Status: DC
  Administered 2012-03-13 – 2012-03-15 (×5): 2 via RESPIRATORY_TRACT
  Filled 2012-03-12: qty 10.6

## 2012-03-12 MED ORDER — ALBUTEROL SULFATE (5 MG/ML) 0.5% IN NEBU
2.5000 mg | INHALATION_SOLUTION | Freq: Four times a day (QID) | RESPIRATORY_TRACT | Status: DC
  Administered 2012-03-13 (×4): 2.5 mg via RESPIRATORY_TRACT
  Filled 2012-03-12 (×4): qty 0.5

## 2012-03-12 MED ORDER — HYDROCOD POLST-CHLORPHEN POLST 10-8 MG/5ML PO LQCR
5.0000 mL | Freq: Once | ORAL | Status: AC
  Administered 2012-03-12: 5 mL via ORAL
  Filled 2012-03-12: qty 5

## 2012-03-12 NOTE — ED Notes (Signed)
Pt presents to the ED with asthma attack. Pt friend states it started at 0515 this am. Respirations are fast, labored, audible wheezing. 100% on RA. Pt presents with a persistent cough. Pt is unable to speak in full sentences.

## 2012-03-12 NOTE — ED Provider Notes (Signed)
History     CSN: [id number]  Arrival date & time 03/12/12  3086   First MD Initiated Contact with Patient 03/12/12 0542      Chief Complaint  Patient presents with  . Asthma    The history is provided by the patient.   the patient reports worsening shortness of breath over the past 12-24 hours.  She has a history of asthma.  She tried her albuterol at home without improvement in her symptoms and thus presents the emergency department for evaluation.  She has a history of asthma and seasonal allergies.  She's had no recent fevers.  She does report worsening cough for the past several days.  She no longer smokes cigarettes.  Her shortness of breath significantly worsened at around 5 this morning.  Her symptoms are severe in severity.  She reports a history of intubation before for severe asthma    Past Medical History  Diagnosis Date  . Asthma   . Seasonal allergies   . Arthritis     Past Surgical History  Procedure Date  . Cholecystectomy   . Cesarean section   . Tubal ligation     Family History  Problem Relation Age of Onset  . Hypertension Mother   . Diabetes Mother   . Cancer Mother     History  Substance Use Topics  . Smoking status: Former Games developer  . Smokeless tobacco: Never Used  . Alcohol Use: No    OB History    Grav Para Term Preterm Abortions TAB SAB Ect Mult Living   5 4 4  0 1 0 1 0 0 3      Review of Systems  All other systems reviewed and are negative.    Allergies  Review of patient's allergies indicates no known allergies.  Home Medications   Current Outpatient Rx  Name Route Sig Dispense Refill  . ACETAMINOPHEN 500 MG PO TABS Oral Take 1,000 mg by mouth every 6 (six) hours as needed. For pain    . ALBUTEROL SULFATE HFA 108 (90 BASE) MCG/ACT IN AERS Inhalation Inhale 2 puffs into the lungs every 6 (six) hours as needed. Shortness of breath and wheezing    . ALBUTEROL SULFATE (2.5 MG/3ML) 0.083% IN NEBU Nebulization Take 3 mLs (2.5 mg  total) by nebulization every 6 (six) hours as needed for wheezing or shortness of breath. 75 mL 12  . BECLOMETHASONE DIPROPIONATE 80 MCG/ACT IN AERS Inhalation Inhale 1 puff into the lungs 2 (two) times daily.    Marland Kitchen EPINEPHRINE 0.3 MG/0.3ML IJ DEVI Intramuscular Inject 0.3 mLs (0.3 mg total) into the muscle as needed. 1 Device 1    BP 169/115  Pulse 92  Resp 22  SpO2 100%  LMP 02/14/2012  Physical Exam  Nursing note and vitals reviewed. Constitutional: She is oriented to person, place, and time. She appears well-developed and well-nourished. No distress.  HENT:  Head: Normocephalic and atraumatic.  Eyes: EOM are normal.  Neck: Normal range of motion.  Cardiovascular: Normal rate, regular rhythm and normal heart sounds.   Pulmonary/Chest: Effort normal and breath sounds normal.  Abdominal: Soft. She exhibits no distension. There is no tenderness.  Musculoskeletal: Normal range of motion.  Neurological: She is alert and oriented to person, place, and time.  Skin: Skin is warm and dry.  Psychiatric: She has a normal mood and affect. Judgment normal.    ED Course  Procedures (including critical care time)  Labs Reviewed  BASIC METABOLIC PANEL - Abnormal; Notable  for the following:    Glucose, Bld 107 (*)     All other components within normal limits  CBC  BLOOD GAS, ARTERIAL   Dg Chest Portable 1 View  03/12/2012  *RADIOLOGY REPORT*  Clinical Data: Short of breath.  Asthma.  PORTABLE CHEST - 1 VIEW  Comparison: 01/27/2012.  Findings: Low volume chest.  Bilateral basilar atelectasis. Monitoring leads are projected over the chest.  No airspace disease.  No effusion. Cardiopericardial silhouette appears within normal limits allowing for inspiratory volumes.  IMPRESSION: Low volume chest with basilar atelectasis.  No acute abnormality.  Original Report Authenticated By: Andreas Newport, M.D.    I personally reviewed the imaging tests through PACS system  I reviewed available  ER/hospitalization records thought the EMR   1. Asthma exacerbation       MDM  This appears to be a severe asthma exacerbation.  She was given an IV dose of Solu-Medrol.  She seems to be improving after a total of 15 mg of albuterol.  She still is bronchospastic at this time.  She'll need admission to the hospital.  I discussed her case with the hospitalist who will admit her.        Lyanne Co, MD 03/12/12 [phone]

## 2012-03-12 NOTE — H&P (Signed)
Wendy James is an 41 y.o. female.    PCP: Georganna Skeans, MD   Chief Complaint: Cough and shortness of breath  HPI: This is a 41 year old, African American female, with a past medical history of asthma, who was in her usual state of health till yesterday morning when she woke up with a cough. She took her inhaler and felt better. However, when she woke up today she again felt short of breath and has started having coughing spells. She was wheezing as well. She tried taking a breathing treatments with no improvement. So, she decided to come in to the hospital. [facility] complaining of tightness in her chest and her back from the difficulty breathing. She is coughing up whitish expectoration. Denies any blood in the sputum. She is feeling somewhat better after receiving breathing treatments in the ER. She tells me that her last attack of asthma was about a month ago and at that time she was treated with steroids. She has about 4-5 episodes of acute asthma exacerbations every year. She was started on inhaled steroids about +4 months ago. She denies any fever or chills. No sick contacts. She did throw up about 3 times early this morning.   Home Medications: Prior to Admission medications   Medication Sig Start Date End Date Taking? Authorizing Provider  acetaminophen (TYLENOL) 500 MG tablet Take 1,000 mg by mouth every 6 (six) hours as needed. For pain   Yes Historical Provider, MD  albuterol (PROVENTIL HFA;VENTOLIN HFA) 108 (90 BASE) MCG/ACT inhaler Inhale 2 puffs into the lungs every 6 (six) hours as needed. Shortness of breath and wheezing   Yes Historical Provider, MD  albuterol (PROVENTIL) (2.5 MG/3ML) 0.083% nebulizer solution Take 3 mLs (2.5 mg total) by nebulization every 6 (six) hours as needed for wheezing or shortness of breath. 12/03/11 12/02/12 Yes Trixie Dredge, PA  beclomethasone (QVAR) 80 MCG/ACT inhaler Inhale 1 puff into the lungs 2 (two) times daily.   Yes Historical Provider, MD    EPINEPHrine (EPIPEN) 0.3 mg/0.3 mL DEVI Inject 0.3 mLs (0.3 mg total) into the muscle as needed. 01/27/12  Yes Trixie Dredge, PA    Allergies: No Known Allergies  Past Medical History: Past Medical History  Diagnosis Date  . Asthma   . Seasonal allergies   . Arthritis     Past Surgical History  Procedure Date  . Cholecystectomy   . Cesarean section   . Tubal ligation     Social History:  reports that she has quit smoking. She has never used smokeless tobacco. She reports that she does not drink alcohol or use illicit drugs.  Family History:  Family History  Problem Relation Age of Onset  . Hypertension Mother   . Diabetes Mother   . Cancer Mother     Review of Systems -  unobtainable from patient due to respiratory difficulties  Physical Examination Blood pressure 169/115, pulse 92, resp. rate 22, last menstrual period 02/14/2012, SpO2 100.00%.  General appearance: alert, cooperative, appears stated age and no distress Head: Normocephalic, without obvious abnormality, atraumatic Eyes: conjunctivae/corneas clear. PERRL, EOM's intact. Throat: lips, mucosa, and tongue normal; teeth and gums normal Neck: no adenopathy, no carotid bruit, no JVD, supple, symmetrical, trachea midline and thyroid not enlarged, symmetric, no tenderness/mass/nodules Back: symmetric, no curvature. ROM normal. No CVA tenderness. Resp: Decreased air entry bilaterally. Few wheezes are present. No definite crackles. Chest wall: no tenderness Cardio: regular rate and rhythm, S1, S2 normal, no murmur, click, rub or gallop GI:  soft, non-tender; bowel sounds normal; no masses,  no organomegaly Extremities: extremities normal, atraumatic, no cyanosis or edema Pulses: 2+ and symmetric Skin: Skin color, texture, turgor normal. No rashes or lesions Lymph nodes: Cervical, supraclavicular, and axillary nodes normal. Neurologic: She is alert and oriented x3. No focal neurological deficits are  present  Laboratory Data: Results for orders placed during the hospital encounter of 03/12/12 (from the past 48 hour(s))  CBC     Status: Normal   Collection Time   03/12/12  5:47 AM      Component Value Range Comment   WBC 5.1  4.0 - 10.5 K/uL    RBC 4.26  3.87 - 5.11 MIL/uL    Hemoglobin 12.6  12.0 - 15.0 g/dL    HCT 21.3  08.6 - 57.8 %    MCV 87.6  78.0 - 100.0 fL    MCH 29.6  26.0 - 34.0 pg    MCHC 33.8  30.0 - 36.0 g/dL    RDW 46.9  62.9 - 52.8 %    Platelets 258  150 - 400 K/uL   BASIC METABOLIC PANEL     Status: Abnormal   Collection Time   03/12/12  5:47 AM      Component Value Range Comment   Sodium 136  135 - 145 mEq/L    Potassium 3.6  3.5 - 5.1 mEq/L    Chloride 104  96 - 112 mEq/L    CO2 19  19 - 32 mEq/L    Glucose, Bld 107 (*) 70 - 99 mg/dL    BUN 12  6 - 23 mg/dL    Creatinine, Ser 4.13  0.50 - 1.10 mg/dL    Calcium 8.9  8.4 - 24.4 mg/dL    GFR calc non Af Amer >90  >90 mL/min    GFR calc Af Amer >90  >90 mL/min   BLOOD GAS, ARTERIAL     Status: Normal   Collection Time   03/12/12  6:07 AM      Component Value Range Comment   O2 Content 8.0      Delivery systems PT GETTING A NEB TX      pH, Arterial 7.424  7.350 - 7.450    pCO2 arterial 36.3  35.0 - 45.0 mmHg    pO2, Arterial 98.4  80.0 - 100.0 mmHg    Bicarbonate 23.3  20.0 - 24.0 mEq/L    TCO2 21.2  0 - 100 mmol/L    Acid-base deficit 0.2  0.0 - 2.0 mmol/L    O2 Saturation 97.2      Patient temperature 98.6      Collection site RIGHT RADIAL      Drawn by [id number]      Sample type ARTERIAL      Allens test (pass/fail) PASS  PASS     Radiology Reports: Dg Chest Portable 1 View  03/12/2012  *RADIOLOGY REPORT*  Clinical Data: Short of breath.  Asthma.  PORTABLE CHEST - 1 VIEW  Comparison: 01/27/2012.  Findings: Low volume chest.  Bilateral basilar atelectasis. Monitoring leads are projected over the chest.  No airspace disease.  No effusion. Cardiopericardial silhouette appears within normal limits  allowing for inspiratory volumes.  IMPRESSION: Low volume chest with basilar atelectasis.  No acute abnormality.  Original Report Authenticated By: Andreas Newport, M.D.     Assessment/Plan  Principal Problem:  *Acute asthma exacerbation Active Problems:  Acute respiratory failure  Asthma in adult   #1 acute asthma exacerbation with  acute respiratory failure with tachypnea: Patient will be treated with the nebulizer treatments. She'll be given intravenous steroids. She will be given azithromycin. Peak flows will be checked. She'll be given a higher dose of inhaled steroids. She will be given antitussive agents  #2 chest tightness: Is probably related to the acute asthma exacerbation. We will check a troponin level.  DVT, prophylaxis with Lovenox.  She's a full code.  Further management decisions will depend on results of further testing and patient's response to treatment.  [facility]  [facility] Pager [phone]  03/12/2012, 9:56 AM

## 2012-03-12 NOTE — ED Notes (Signed)
Respiratory has been called for hour long neg treatment

## 2012-03-13 LAB — BASIC METABOLIC PANEL
BUN: 6 mg/dL (ref 6–23)
CO2: 25 mEq/L (ref 19–32)
Calcium: 8.8 mg/dL (ref 8.4–10.5)
Creatinine, Ser: 0.54 mg/dL (ref 0.50–1.10)
Glucose, Bld: 161 mg/dL — ABNORMAL HIGH (ref 70–99)
Sodium: 138 mEq/L (ref 135–145)

## 2012-03-13 LAB — CBC
HCT: 34 % — ABNORMAL LOW (ref 36.0–46.0)
Hemoglobin: 11.2 g/dL — ABNORMAL LOW (ref 12.0–15.0)
MCH: 29.2 pg (ref 26.0–34.0)
MCV: 88.8 fL (ref 78.0–100.0)
RBC: 3.83 MIL/uL — ABNORMAL LOW (ref 3.87–5.11)

## 2012-03-13 MED ORDER — ALBUTEROL SULFATE HFA 108 (90 BASE) MCG/ACT IN AERS
1.0000 | INHALATION_SPRAY | RESPIRATORY_TRACT | Status: DC | PRN
  Filled 2012-03-13: qty 6.7

## 2012-03-13 MED ORDER — ALBUTEROL SULFATE (5 MG/ML) 0.5% IN NEBU
2.5000 mg | INHALATION_SOLUTION | Freq: Four times a day (QID) | RESPIRATORY_TRACT | Status: DC | PRN
  Administered 2012-03-13: 2.5 mg via RESPIRATORY_TRACT

## 2012-03-13 MED ORDER — ALBUTEROL SULFATE (5 MG/ML) 0.5% IN NEBU
5.0000 mg | INHALATION_SOLUTION | Freq: Four times a day (QID) | RESPIRATORY_TRACT | Status: DC | PRN
  Filled 2012-03-13: qty 0.5

## 2012-03-13 MED ORDER — ALBUTEROL SULFATE HFA 108 (90 BASE) MCG/ACT IN AERS
2.0000 | INHALATION_SPRAY | Freq: Three times a day (TID) | RESPIRATORY_TRACT | Status: DC
  Administered 2012-03-14: 2 via RESPIRATORY_TRACT
  Filled 2012-03-13: qty 6.7

## 2012-03-13 MED ORDER — BACITRACIN-NEOMYCIN-POLYMYXIN 400-5-5000 EX OINT
TOPICAL_OINTMENT | CUTANEOUS | Status: AC
  Filled 2012-03-13: qty 1

## 2012-03-13 MED ORDER — AZITHROMYCIN 500 MG PO TABS
500.0000 mg | ORAL_TABLET | Freq: Every day | ORAL | Status: DC
  Administered 2012-03-13 – 2012-03-15 (×3): 500 mg via ORAL
  Filled 2012-03-13 (×3): qty 1

## 2012-03-13 MED ORDER — METHYLPREDNISOLONE SODIUM SUCC 125 MG IJ SOLR
80.0000 mg | Freq: Three times a day (TID) | INTRAMUSCULAR | Status: DC
  Administered 2012-03-13 – 2012-03-15 (×7): 80 mg via INTRAVENOUS
  Filled 2012-03-13 (×9): qty 1.28

## 2012-03-13 NOTE — Progress Notes (Signed)
PCP: Georganna Skeans, MD  Brief HPI:  This is a 41 year old, African American female, with a past medical history of asthma, who was in her usual state of health till the morning before admission when she woke up with a cough. She took her inhaler and felt better. However, when she woke up on the day of admission she again felt short of breath and has started having coughing spells. She was wheezing as well. She tried taking a breathing treatments with no improvement. So, she decided to come in to the hospital. She was complaining of tightness in her chest and her back from the difficulty breathing. She was coughing up whitish expectoration. Denied any blood in the sputum. She felt somewhat better after receiving breathing treatments in the ER. She mentioned that her last attack of asthma was about a month ago and at that time she was treated with steroids. She has about 4-5 episodes of acute asthma exacerbations every year. She was started on inhaled steroids about +4 months ago. She denied any fever or chills. No sick contacts. She did throw up about 3 times on the morning of admission.  Past medical history:  Past Medical History  Diagnosis Date  . Asthma   . Seasonal allergies   . Arthritis     Consultants: None  Procedures: None  Subjective: Patient feels better. Chest tightness is almost resolved. Cough is better. No nausea or vomiting.  Objective: Vital signs in last 24 hours: Temp:  [98.4 F (36.9 C)-99 F (37.2 C)] 98.6 F (37 C) (08/15 0604) Pulse Rate:  [63-107] 72  (08/15 0604) Resp:  [18-24] 18  (08/15 0604) BP: (137-150)/(80-89) 142/88 mmHg (08/15 0604) SpO2:  [96 %-100 %] 96 % (08/15 0604) Weight:  [92.4 kg (203 lb 11.3 oz)] 92.4 kg (203 lb 11.3 oz) (08/14 1426) Weight change:  Last BM Date: 03/10/12  Intake/Output from previous day: 08/14 0701 - 08/15 0700 In: 595 [P.O.:120; I.V.:475] Out: -  Intake/Output this shift:    General appearance: alert,  cooperative, appears stated age and moderately obese. No distress Head: Normocephalic, without obvious abnormality, atraumatic Back: symmetric, no curvature. ROM normal. No CVA tenderness. Resp: Improved air entry bilaterally. No wheezing Cardio: regular rate and rhythm, S1, S2 normal, no murmur, click, rub or gallop GI: soft, non-tender; bowel sounds normal; no masses,  no organomegaly Extremities: extremities normal, atraumatic, no cyanosis or edema Pulses: 2+ and symmetric Skin: Scaly lesions noted along lower aspect of right foot. No active inflammation Lymph nodes: Cervical, supraclavicular, and axillary nodes  Neurologic: Alert and oriented x 3. No focal deficits.  Lab Results:  Basename 03/13/12 0330 03/12/12 0547  WBC 9.3 5.1  HGB 11.2* 12.6  HCT 34.0* 37.3  PLT 218 258   BMET  Basename 03/13/12 0330 03/12/12 0547  NA 138 136  K 3.6 3.6  CL 104 104  CO2 25 19  GLUCOSE 161* 107*  BUN 6 12  CREATININE 0.54 0.69  CALCIUM 8.8 8.9  ALT -- --    Studies/Results: Dg Chest Portable 1 View  03/12/2012  *RADIOLOGY REPORT*  Clinical Data: Short of breath.  Asthma.  PORTABLE CHEST - 1 VIEW  Comparison: 01/27/2012.  Findings: Low volume chest.  Bilateral basilar atelectasis. Monitoring leads are projected over the chest.  No airspace disease.  No effusion. Cardiopericardial silhouette appears within normal limits allowing for inspiratory volumes.  IMPRESSION: Low volume chest with basilar atelectasis.  No acute abnormality.  Original Report Authenticated By: Andreas Newport, M.D.  Medications:  Scheduled:   . albuterol  2.5 mg Nebulization Once  . albuterol  2.5 mg Nebulization Q6H  . azithromycin  500 mg Oral Daily  . benzonatate  100 mg Oral TID  . chlorpheniramine-HYDROcodone  5 mL Oral Q12H  . chlorpheniramine-HYDROcodone  5 mL Oral Once  . enoxaparin (LOVENOX) injection  40 mg Subcutaneous Q24H  . fluticasone  2 puff Inhalation BID  . ipratropium  0.5 mg  Nebulization Q6H  . methylPREDNISolone (SOLU-MEDROL) injection  80 mg Intravenous Q8H  .  morphine injection  2 mg Intravenous Once  . pantoprazole  40 mg Oral Q1200  . zolpidem  5 mg Oral Once  . DISCONTD: albuterol  2.5 mg Nebulization Q4H  . DISCONTD: azithromycin  500 mg Intravenous Q24H  . DISCONTD: ipratropium  0.5 mg Nebulization Q4H  . DISCONTD: methylPREDNISolone (SOLU-MEDROL) injection  80 mg Intravenous Q6H   Continuous:   . sodium chloride 100 mL/hr at 03/12/12 2345   ZOX:[id number], acetaminophen, albuterol, alum & mag hydroxide-simeth, ondansetron (ZOFRAN) IV, ondansetron, oxyCODONE, polyethylene glycol, senna-docusate, DISCONTD: albuterol  Assessment/Plan:  Principal Problem:  *Acute asthma exacerbation Active Problems:  Acute respiratory failure  Asthma in adult  Chest tightness    Acute asthma exacerbation with acute respiratory failure with tachypnea Patient has improved. She breathing better. Change to oral antibiotics. Taper steroids. Decrease frequency of Nebs. Continue to check peak flows. Continue higher dose of inhaled steroids and antitussive agents.  Chest tightness Resolved. Was probably related to the acute asthma exacerbation. Trop was normal.  Recent right foot infection/?Cellutlitis Is still on antibiotics. She will call home to get the name. Is improving per patient.  Low TSH Probably from acute illness. Check Free T4  DVT Prophylaxis Lovenox.   Code Status She's a full code.  Disposition To home when stable. Possible discharge in AM   LOS: 1 day   [facility]  [facility] Pager [phone] 03/13/2012, 7:26 AM

## 2012-03-14 ENCOUNTER — Ambulatory Visit: Payer: Self-pay | Admitting: Advanced Practice Midwife

## 2012-03-14 LAB — D-DIMER, QUANTITATIVE: D-Dimer, Quant: 0.26 ug/mL-FEU (ref 0.00–0.48)

## 2012-03-14 MED ORDER — ALBUTEROL SULFATE (5 MG/ML) 0.5% IN NEBU
2.5000 mg | INHALATION_SOLUTION | Freq: Four times a day (QID) | RESPIRATORY_TRACT | Status: DC
  Administered 2012-03-14 – 2012-03-15 (×3): 2.5 mg via RESPIRATORY_TRACT
  Filled 2012-03-14 (×5): qty 0.5

## 2012-03-14 NOTE — Progress Notes (Signed)
PCP: Georganna Skeans, MD  Brief HPI:  This is a 41 year old, African American female, with a past medical history of asthma, who was in her usual state of health till the morning before admission when she woke up with a cough. She took her inhaler and felt better. However, when she woke up on the day of admission she again felt short of breath and has started having coughing spells. She was wheezing as well. She tried taking a breathing treatments with no improvement. So, she decided to come in to the hospital. She was complaining of tightness in her chest and her back from the difficulty breathing. She was coughing up whitish expectoration. Denied any blood in the sputum. She felt somewhat better after receiving breathing treatments in the ER. She mentioned that her last attack of asthma was about a month ago and at that time she was treated with steroids. She has about 4-5 episodes of acute asthma exacerbations every year. She was started on inhaled steroids about +4 months ago. She denied any fever or chills. No sick contacts. She did throw up about 3 times on the morning of admission.  Past medical history:  Past Medical History  Diagnosis Date  . Asthma   . Seasonal allergies   . Arthritis     Consultants: None  Procedures: None  Subjective: Patient had another episode of acute shortness of breath this morning. Feels better now. Overall she feels better compared to admission. Chest tightness only when she is short of breath.  Objective: Vital signs in last 24 hours: Temp:  [97.6 F (36.4 C)-98.4 F (36.9 C)] 98.4 F (36.9 C) (08/16 0549) Pulse Rate:  [70-79] 70  (08/16 0549) Resp:  [18] 18  (08/16 0549) BP: (140-150)/(89-95) 150/95 mmHg (08/16 0549) SpO2:  [95 %-98 %] 97 % (08/16 0746) Weight change:  Last BM Date: 03/13/12  Intake/Output from previous day: 08/15 0701 - 08/16 0700 In: 240 [P.O.:240] Out: -  Intake/Output this shift:    General appearance: alert,  cooperative, appears stated age and moderately obese. No distress Head: Normocephalic, without obvious abnormality, atraumatic Resp: Improved air entry bilaterally. Minimal wheezing Cardio: regular rate and rhythm, S1, S2 normal, no murmur, click, rub or gallop GI: soft, non-tender; bowel sounds normal; no masses,  no organomegaly Extremities: extremities normal, atraumatic, no cyanosis or edema Pulses: 2+ and symmetric Skin: Scaly lesions noted along lower aspect of right foot. No active inflammation Neurologic: Alert and oriented x 3. No focal deficits.  Lab Results:  Basename 03/13/12 0330 03/12/12 0547  WBC 9.3 5.1  HGB 11.2* 12.6  HCT 34.0* 37.3  PLT 218 258   BMET  Basename 03/13/12 0330 03/12/12 0547  NA 138 136  K 3.6 3.6  CL 104 104  CO2 25 19  GLUCOSE 161* 107*  BUN 6 12  CREATININE 0.54 0.69  CALCIUM 8.8 8.9  ALT -- --    Studies/Results: No results found.  Medications:  Scheduled:    . albuterol  2 puff Inhalation TID  . azithromycin  500 mg Oral Daily  . benzonatate  100 mg Oral TID  . chlorpheniramine-HYDROcodone  5 mL Oral Q12H  . enoxaparin (LOVENOX) injection  40 mg Subcutaneous Q24H  . fluticasone  2 puff Inhalation BID  . methylPREDNISolone (SOLU-MEDROL) injection  80 mg Intravenous Q8H  . neomycin-bacitracin-polymyxin      . pantoprazole  40 mg Oral Q1200  . DISCONTD: albuterol  2.5 mg Nebulization Q6H  . DISCONTD: ipratropium  0.5  mg Nebulization Q6H   Continuous:  RUE:[id number], acetaminophen, albuterol, albuterol, alum & mag hydroxide-simeth, ondansetron (ZOFRAN) IV, ondansetron, oxyCODONE, polyethylene glycol, senna-docusate, DISCONTD: albuterol, DISCONTD: albuterol  Assessment/Plan:  Principal Problem:  *Acute asthma exacerbation Active Problems:  Acute respiratory failure  Asthma in adult  Chest tightness    Acute asthma exacerbation Patient has improved. But still has these episodes of acute SOB. Will check d-dimer.  Continue oral antibiotics and current dose of steroids. Continue Nebs for now. Peak flows are better. Continue higher dose of inhaled steroids and antitussive agents.  Chest tightness Improved. Was probably related to the acute asthma exacerbation. Trop was normal. See above  Recent right foot infection/?Cellutlitis Is still on antibiotics. She will call home to get the name. Is improving per patient.  Low TSH Probably from acute illness. Free T4 is normal.  DVT Prophylaxis Lovenox.   Code Status She's a full code.  Disposition To home when stable. Not quite ready for discharge yet.   LOS: 2 days   [facility]  [facility] Pager [phone] 03/14/2012, 1:01 PM

## 2012-03-15 ENCOUNTER — Inpatient Hospital Stay (HOSPITAL_COMMUNITY): Payer: MEDICAID

## 2012-03-15 MED ORDER — BENZONATATE 100 MG PO CAPS: 100.0000 mg | ORAL_CAPSULE | Freq: Three times a day (TID) | ORAL | Status: AC

## 2012-03-15 MED ORDER — ALBUTEROL SULFATE (2.5 MG/3ML) 0.083% IN NEBU: 2.5000 mg | INHALATION_SOLUTION | Freq: Four times a day (QID) | RESPIRATORY_TRACT | Status: DC

## 2012-03-15 MED ORDER — BECLOMETHASONE DIPROPIONATE 80 MCG/ACT IN AERS: 2.0000 | INHALATION_SPRAY | Freq: Two times a day (BID) | RESPIRATORY_TRACT | Status: DC

## 2012-03-15 MED ORDER — PREDNISONE 20 MG PO TABS: ORAL_TABLET | ORAL | Status: DC

## 2012-03-15 MED ORDER — RANITIDINE HCL 150 MG PO TABS: 150.0000 mg | ORAL_TABLET | Freq: Two times a day (BID) | ORAL | Status: DC

## 2012-03-15 MED ORDER — AZITHROMYCIN 500 MG PO TABS: 500.0000 mg | ORAL_TABLET | Freq: Every day | ORAL | Status: AC

## 2012-03-15 NOTE — Progress Notes (Signed)
Cm spoke with patient concerning dc planning. MD order for home nebulizer. Patient states home nebulizer, unable to afford meds. Per pharmacy patient ineligible for indigent funds due to usage of funds in May 2013. Patient provided with Nico[facility] Bang generic list in which most of her dc meds are listed. Patient advised of Merilynn [place] free generics to [employer] members. No other needs specified at this time.   Leonie Green [phone]

## 2012-03-15 NOTE — Discharge Summary (Addendum)
Physician Discharge Summary  Patient ID: Wendy James MRN: [id number] DOB/AGE: 10/25/1970 41 y.o.  Admit date: 03/12/2012 Discharge date: 03/15/2012  PCP: Georganna Skeans, MD  DISCHARGE DIAGNOSES:  Principal Problem:  *Acute asthma exacerbation Active Problems:  Acute respiratory failure  Asthma in adult  Chest tightness   RECOMMENDATIONS TO PCP: 1. Will need recheck of BP  DISCHARGE CONDITION: fair  INITIAL HISTORY: This is a 41 year old, African American female, with a past medical history of asthma, who was in her usual state of health till the morning before admission when she woke up with a cough. She took her inhaler and felt better. However, when she woke up on the day of admission she again felt short of breath and has started having coughing spells. She was wheezing as well. She tried taking a breathing treatments with no improvement. So, she decided to come in to the hospital. She was complaining of tightness in her chest and her back from the difficulty breathing. She was coughing up whitish expectoration. Denied any blood in the sputum. She felt somewhat better after receiving breathing treatments in the ER. She mentioned that her last attack of asthma was about a month ago and at that time she was treated with steroids. She has about 4-5 episodes of acute asthma exacerbations every year. She was started on inhaled steroids about +4 months ago. She denied any fever or chills. No sick contacts. She did throw up about 3 times on the morning of admission.   HOSPITAL COURSE:   Acute asthma exacerbation  Her main symptom was cough. She did have a few wheezes. Patient was put on steroids, nebulizer treatments, and antibiotics. She was slow to respond. A d-dimer was checked, which was normal. Her lungs are much more clear now. She has good airentry. Peak flows are improving. She's been ambulating in the hallway with no difficulties. Oral steroids will be prescribed and  tapered  over 12 days. She's been asked to increase the dose of her Qvar. She'll be prescribed a nebulizer machine. She will need followup with her primary care physician.   Elevated blood pressure without diagnoses of hypertension. Her blood pressure is elevated because of steroids. As these are tapered off blood pressure should improve. Blood pressure was high when I had it checked just now and this is probably because she was walking in the hallway. She's complaining of some dizziness at this time. We will monitor for a few hours and when she is better she can go home. No focal deficits are present.  Chest tightness  This is improved. This was probably from asthma exacerbation. Troponin was normal. D-dimer was normal.   Recent right foot infection/?Cellutlitis  Patient was supposed to let us know what antibiotic she was on at home, but she hasn't done so. She'll continue taking it till completion of her course.  Patient is feeling better from a breathing standpoint. She was has been ambulating in the hallway and is feeling a little dizzy at this time. She does not have any focal neurological deficits. Blood pressure is elevated and not low. We will monitor her for few more hours and then when she is better. She will be discharged home.  Case management will be consulted to facilitate followup for this patient. She's been given a work letter saying that she needs to stay out of work for 4 days.  PERTINENT LABS: TSH was 0.247. Free T4 was 1.2   IMAGING STUDIES Dg Chest Portable 1 View  03/12/2012  *RADIOLOGY REPORT*  Clinical Data: Short of breath.  Asthma.  PORTABLE CHEST - 1 VIEW  Comparison: 01/27/2012.  Findings: Low volume chest.  Bilateral basilar atelectasis. Monitoring leads are projected over the chest.  No airspace disease.  No effusion. Cardiopericardial silhouette appears within normal limits allowing for inspiratory volumes.  IMPRESSION: Low volume chest with basilar atelectasis.  No acute  abnormality.  Original Report Authenticated By: Andreas Newport, M.D.   Dg Foot Complete Right  02/18/2012  *RADIOLOGY REPORT*  Clinical Data: Blister on lateral foot  RIGHT FOOT COMPLETE - 3+ VIEW  Comparison: None.  Findings: Negative for fracture.  Negative for osteomyelitis.  Soft tissue defect in the lateral foot.  Negative for foreign body.  IMPRESSION: No significant bony abnormality.  Original Report Authenticated By: Camelia Phenes, M.D.    DISCHARGE EXAMINATION: Blood pressure 146/92, pulse 60, temperature 98.9 F (37.2 C), temperature source Oral, resp. rate 18, height 5\' 1"  (1.549 m), weight 92.4 kg (203 lb 11.3 oz), last menstrual period 02/14/2012, SpO2 94.00%. General appearance: alert, cooperative, appears stated age and no distress Head: Normocephalic, without obvious abnormality, atraumatic Back: symmetric, no curvature. ROM normal. No CVA tenderness. Resp: clear to auscultation bilaterally Cardio: regular rate and rhythm, S1, S2 normal, no murmur, click, rub or gallop GI: soft, non-tender; bowel sounds normal; no masses,  no organomegaly Neurologic: Grossly normal. No focal deficits  DISPOSITION: Home  Discharge Orders    Future Appointments: Provider: Department: Dept Phone: Center:   04/09/2012 2:45 PM Tereso Newcomer, MD [facility] [phone] [facility]     Future Orders Please Complete By Expires   Diet - low sodium heart healthy      Increase activity slowly      Discharge instructions      Comments:   Follow up with your PCP for further management of Asthma     Current Discharge Medication List    START taking these medications   Details  azithromycin (ZITHROMAX) 500 MG tablet Take 1 tablet (500 mg total) by mouth daily. For 2 more days Qty: 2 tablet, Refills: 0    benzonatate (TESSALON) 100 MG capsule Take 1 capsule (100 mg total) by mouth 3 (three) times daily. Qty: 20 capsule, Refills: 0    predniSONE (DELTASONE) 20 MG tablet Take 3 tabs daily  for 4 days, then take 2 tabs daily for 4 days, then take 1 tab daily for 4 days, then STOP Qty: 24 tablet, Refills: 0    ranitidine (ZANTAC) 150 MG tablet Take 1 tablet (150 mg total) by mouth 2 (two) times daily. Qty: 60 tablet, Refills: 0      CONTINUE these medications which have CHANGED   Details  albuterol (PROVENTIL) (2.5 MG/3ML) 0.083% nebulizer solution Take 3 mLs (2.5 mg total) by nebulization every 6 (six) hours. Qty: 75 mL, Refills: 12    beclomethasone (QVAR) 80 MCG/ACT inhaler Inhale 2 puffs into the lungs 2 (two) times daily. Qty: 1 Inhaler, Refills: 2      CONTINUE these medications which have NOT CHANGED   Details  acetaminophen (TYLENOL) 500 MG tablet Take 1,000 mg by mouth every 6 (six) hours as needed. For pain    albuterol (PROVENTIL HFA;VENTOLIN HFA) 108 (90 BASE) MCG/ACT inhaler Inhale 2 puffs into the lungs every 6 (six) hours as needed. Shortness of breath and wheezing    EPINEPHrine (EPIPEN) 0.3 mg/0.3 mL DEVI Inject 0.3 mLs (0.3 mg total) into the muscle as needed. Qty: 1 Device,  Refills: 1       Follow-up Information    Follow up with [facility], [place]. Schedule an appointment as soon as possible for a visit in 1 week. (post hospitalization follow up)    Contact information:   [address] [place] [address] [phone]          TOTAL DISCHARGE TIME: 35 mins  [facility]  [facility] Pager [phone]  03/15/2012, 11:52 AM  Patient's BP improved but her lightheadedness persisted. She did not have any focal deficits but we obtained a CT head which showed chronic changes. These were discussed with the patient and she was asked to follow up with her doctor for the same. She may benefit from a MRI at some point. She is otherwise considered stable for discharge. Her lightheadedness is probably due to her current illness. D Dimer was normal. She is agreeable with the plan. Ok for discharge.  Chesky Heyer 3:08 PM

## 2012-04-02 ENCOUNTER — Encounter (HOSPITAL_COMMUNITY): Payer: Self-pay | Admitting: Emergency Medicine

## 2012-04-02 ENCOUNTER — Emergency Department (HOSPITAL_COMMUNITY)
Admission: EM | Admit: 2012-04-02 | Discharge: 2012-04-02 | Disposition: A | Discharge status: Home / Self Care | Payer: Self-pay | Attending: Emergency Medicine | Admitting: Emergency Medicine

## 2012-04-02 DIAGNOSIS — K089 Disorder of teeth and supporting structures, unspecified: Secondary | ICD-10-CM | POA: Insufficient documentation

## 2012-04-02 DIAGNOSIS — J45909 Unspecified asthma, uncomplicated: Secondary | ICD-10-CM | POA: Insufficient documentation

## 2012-04-02 DIAGNOSIS — K047 Periapical abscess without sinus: Secondary | ICD-10-CM

## 2012-04-02 DIAGNOSIS — Z87891 Personal history of nicotine dependence: Secondary | ICD-10-CM | POA: Insufficient documentation

## 2012-04-02 DIAGNOSIS — M129 Arthropathy, unspecified: Secondary | ICD-10-CM | POA: Insufficient documentation

## 2012-04-02 MED ORDER — PENICILLIN V POTASSIUM 500 MG PO TABS: 500.0000 mg | ORAL_TABLET | Freq: Four times a day (QID) | ORAL | Status: AC

## 2012-04-02 MED ORDER — IBUPROFEN 800 MG PO TABS: 800.0000 mg | ORAL_TABLET | Freq: Three times a day (TID) | ORAL | Status: AC

## 2012-04-02 MED ORDER — HYDROCODONE-ACETAMINOPHEN 7.5-500 MG/15ML PO SOLN: 15.0000 mL | Freq: Four times a day (QID) | ORAL | Status: AC | PRN

## 2012-04-02 MED ORDER — BUPIVACAINE HCL (PF) 0.5 % IJ SOLN
0.8000 mL | Freq: Once | INTRAMUSCULAR | Status: DC
  Filled 2012-04-02: qty 30

## 2012-04-02 MED ORDER — BUPIVACAINE-EPINEPHRINE PF 0.5-1:200000 % IJ SOLN
INTRAMUSCULAR | Status: AC
  Administered 2012-04-02: 05:00:00
  Filled 2012-04-02: qty 1.8

## 2012-04-02 NOTE — ED Notes (Signed)
Pt comes in tonight c/o toothache on the left top  Pt states pain started about 0030 tonight

## 2012-04-02 NOTE — ED Provider Notes (Signed)
History     CSN: [id number]  Arrival date & time 04/02/12  0252   First MD Initiated Contact with Patient 04/02/12 0345      Chief Complaint  Patient presents with  . Dental Pain    (Consider location/radiation/quality/duration/timing/severity/associated sxs/prior treatment) HPI HX per PT. L upper dental pain started a few days ago, worse tonight. No F/C, no N/V, no facial swelling, no trauma, does not have a DDS. Pain sharp, worse with chewing, unable to sleep, mod to severe and not radiating.  Past Medical History  Diagnosis Date  . Asthma   . Seasonal allergies   . Arthritis     Past Surgical History  Procedure Date  . Cholecystectomy   . Cesarean section   . Tubal ligation     Family History  Problem Relation Age of Onset  . Hypertension Mother   . Diabetes Mother   . Cancer Mother     History  Substance Use Topics  . Smoking status: Former Games developer  . Smokeless tobacco: Never Used  . Alcohol Use: No    OB History    Grav Para Term Preterm Abortions TAB SAB Ect Mult Living   5 4 4  0 1 0 1 0 0 3      Review of Systems  Constitutional: Negative for fever and chills.  HENT: Positive for dental problem. Negative for neck pain and neck stiffness.   Eyes: Negative for pain.  Respiratory: Negative for shortness of breath.   Cardiovascular: Negative for chest pain.  Gastrointestinal: Negative for abdominal pain.  Genitourinary: Negative for dysuria.  Musculoskeletal: Negative for back pain.  Skin: Negative for rash.  Neurological: Negative for headaches.  All other systems reviewed and are negative.    Allergies  Review of patient's allergies indicates no known allergies.  Home Medications   Current Outpatient Rx  Name Route Sig Dispense Refill  . ALBUTEROL SULFATE HFA 108 (90 BASE) MCG/ACT IN AERS Inhalation Inhale 2 puffs into the lungs every 6 (six) hours as needed. Shortness of breath and wheezing    . ALBUTEROL SULFATE (2.5 MG/3ML) 0.083% IN  NEBU Nebulization Take 2.5 mg by nebulization every 6 (six) hours as needed. For shortness of breath    . EPINEPHRINE 0.3 MG/0.3ML IJ DEVI Intramuscular Inject 0.3 mg into the muscle once.    Marland Kitchen RANITIDINE HCL 150 MG PO TABS Oral Take 150 mg by mouth 2 (two) times daily.    . ACETAMINOPHEN 500 MG PO TABS Oral Take 1,000 mg by mouth every 6 (six) hours as needed. For pain    . PREDNISONE 20 MG PO TABS  Take 3 tabs daily for 4 days, then take 2 tabs daily for 4 days, then take 1 tab daily for 4 days, then STOP 24 tablet 0    BP 146/82  Pulse 63  Temp 99.2 F (37.3 C) (Oral)  Resp 22  SpO2 98%  LMP 03/14/2012  Physical Exam  Constitutional: She is oriented to person, place, and time. She appears well-developed and well-nourished.  HENT:  Head: Normocephalic and atraumatic.       TTP L upper second molar, no gingival edema or facial erythema, no trismus, uvula midline, TMs clear  Eyes: Conjunctivae and EOM are normal. Pupils are equal, round, and reactive to light.  Neck: Trachea normal. Neck supple. No thyromegaly present.  Cardiovascular: Normal rate, regular rhythm, S1 normal, S2 normal and normal pulses.     No systolic murmur is present   No  diastolic murmur is present  Pulses:      Radial pulses are 2+ on the right side, and 2+ on the left side.  Pulmonary/Chest: Effort normal and breath sounds normal. She has no wheezes. She has no rhonchi. She has no rales. She exhibits no tenderness.  Abdominal: Soft. Normal appearance and bowel sounds are normal. There is no tenderness. There is no CVA tenderness and negative Murphy's sign.  Musculoskeletal:       BLE:s Calves nontender, no cords or erythema, negative Homans sign  Neurological: She is alert and oriented to person, place, and time. She has normal strength. No cranial nerve deficit or sensory deficit. GCS eye subscore is 4. GCS verbal subscore is 5. GCS motor subscore is 6.  Skin: Skin is warm and dry. No rash noted. She is not  diaphoretic.  Psychiatric: Her speech is normal.       Cooperative and appropriate    ED Course  Dental Date/Time: 04/02/2012 4:41 AM Performed by: Sunnie Nielsen Authorized by: Sunnie Nielsen Consent: Verbal consent obtained. Risks and benefits: risks, benefits and alternatives were discussed Consent given by: patient Patient understanding: patient states understanding of the procedure being performed Patient consent: the patient's understanding of the procedure matches consent given Procedure consent: procedure consent matches procedure scheduled Required items: required blood products, implants, devices, and special equipment available Patient identity confirmed: verbally with patient Time out: Immediately prior to procedure a "time out" was called to verify the correct patient, procedure, equipment, support staff and site/side marked as required. Preparation: Patient was prepped and draped in the usual sterile fashion. Local anesthesia used: yes Local anesthetic: bupivacaine 0.5% without epinephrine Anesthetic total: 1.8 ml Patient tolerance: Patient tolerated the procedure well with no immediate complications. Comments: L upper second molar block performed   Adequate pain control achieved  Stable for d/c home, RX provided, plan DDs follow up. Precautions verbalized as understood.    MDM  VS and nursing notes reviewed. Dental Block        Sunnie Nielsen, MD 04/02/12 [phone]

## 2012-04-09 ENCOUNTER — Ambulatory Visit: Payer: Self-pay | Admitting: Obstetrics & Gynecology

## 2012-04-10 ENCOUNTER — Inpatient Hospital Stay (HOSPITAL_COMMUNITY)
Admission: EM | Admit: 2012-04-10 | Discharge: 2012-04-15 | DRG: 189 | Disposition: A | Discharge status: Home / Self Care | Payer: MEDICAID | Attending: Internal Medicine | Admitting: Internal Medicine

## 2012-04-10 ENCOUNTER — Emergency Department (HOSPITAL_COMMUNITY): Payer: Self-pay

## 2012-04-10 ENCOUNTER — Encounter (HOSPITAL_COMMUNITY): Payer: Self-pay | Admitting: *Deleted

## 2012-04-10 DIAGNOSIS — K219 Gastro-esophageal reflux disease without esophagitis: Secondary | ICD-10-CM | POA: Diagnosis present

## 2012-04-10 DIAGNOSIS — J9600 Acute respiratory failure, unspecified whether with hypoxia or hypercapnia: Principal | ICD-10-CM | POA: Diagnosis present

## 2012-04-10 DIAGNOSIS — IMO0001 Reserved for inherently not codable concepts without codable children: Secondary | ICD-10-CM

## 2012-04-10 DIAGNOSIS — J209 Acute bronchitis, unspecified: Secondary | ICD-10-CM | POA: Diagnosis present

## 2012-04-10 DIAGNOSIS — E876 Hypokalemia: Secondary | ICD-10-CM | POA: Diagnosis present

## 2012-04-10 DIAGNOSIS — J45901 Unspecified asthma with (acute) exacerbation: Secondary | ICD-10-CM

## 2012-04-10 LAB — CBC WITH DIFFERENTIAL/PLATELET
Basophils Relative: 0 % (ref 0–1)
Eosinophils Absolute: 0 10*3/uL (ref 0.0–0.7)
Hemoglobin: 11.4 g/dL — ABNORMAL LOW (ref 12.0–15.0)
MCH: 29.1 pg (ref 26.0–34.0)
MCHC: 33.6 g/dL (ref 30.0–36.0)
Monocytes Relative: 1 % — ABNORMAL LOW (ref 3–12)
Neutrophils Relative %: 85 % — ABNORMAL HIGH (ref 43–77)
Platelets: 251 10*3/uL (ref 150–400)

## 2012-04-10 LAB — BASIC METABOLIC PANEL
BUN: 10 mg/dL (ref 6–23)
GFR calc Af Amer: >90 mL/min (ref >90)
GFR calc non Af Amer: >90 mL/min (ref >90)
Potassium: 2.8 mEq/L — ABNORMAL LOW (ref 3.5–5.1)

## 2012-04-10 MED ORDER — RACEPINEPHRINE HCL 2.25 % IN NEBU
0.5000 mL | INHALATION_SOLUTION | Freq: Once | RESPIRATORY_TRACT | Status: AC
  Administered 2012-04-10: 0.5 mL via RESPIRATORY_TRACT
  Filled 2012-04-10: qty 0.5

## 2012-04-10 MED ORDER — HYDROCODONE-ACETAMINOPHEN 7.5-500 MG/15ML PO SOLN
15.0000 mL | Freq: Four times a day (QID) | ORAL | Status: DC | PRN
  Administered 2012-04-10 – 2012-04-14 (×5): 15 mL via ORAL
  Filled 2012-04-10 (×5): qty 15

## 2012-04-10 MED ORDER — INFLUENZA VIRUS VACC SPLIT PF IM SUSP
0.5000 mL | INTRAMUSCULAR | Status: AC
  Administered 2012-04-11: 0.5 mL via INTRAMUSCULAR
  Filled 2012-04-10: qty 0.5

## 2012-04-10 MED ORDER — MAGNESIUM SULFATE 40 MG/ML IJ SOLN
2.0000 g | Freq: Once | INTRAMUSCULAR | Status: AC
  Administered 2012-04-10: 2 g via INTRAVENOUS
  Filled 2012-04-10: qty 50

## 2012-04-10 MED ORDER — IPRATROPIUM BROMIDE 0.02 % IN SOLN
1.0000 mg | Freq: Once | RESPIRATORY_TRACT | Status: AC
  Administered 2012-04-10: 1 mg via RESPIRATORY_TRACT
  Filled 2012-04-10: qty 5

## 2012-04-10 MED ORDER — ALBUTEROL SULFATE HFA 108 (90 BASE) MCG/ACT IN AERS: 2.0000 | INHALATION_SPRAY | RESPIRATORY_TRACT | Status: DC | PRN

## 2012-04-10 MED ORDER — POTASSIUM CHLORIDE IN NACL 40-0.9 MEQ/L-% IV SOLN
INTRAVENOUS | Status: DC
  Administered 2012-04-10 – 2012-04-11 (×2): via INTRAVENOUS
  Filled 2012-04-10 (×4): qty 1000

## 2012-04-10 MED ORDER — IPRATROPIUM BROMIDE 0.02 % IN SOLN
0.5000 mg | RESPIRATORY_TRACT | Status: DC
  Administered 2012-04-10 – 2012-04-15 (×29): 0.5 mg via RESPIRATORY_TRACT
  Filled 2012-04-10 (×28): qty 2.5

## 2012-04-10 MED ORDER — ALBUTEROL SULFATE (5 MG/ML) 0.5% IN NEBU
2.5000 mg | INHALATION_SOLUTION | RESPIRATORY_TRACT | Status: DC
  Administered 2012-04-10 – 2012-04-15 (×29): 2.5 mg via RESPIRATORY_TRACT
  Filled 2012-04-10 (×30): qty 0.5

## 2012-04-10 MED ORDER — RACEPINEPHRINE HCL 2.25 % IN NEBU: 0.5000 mL | INHALATION_SOLUTION | Freq: Once | RESPIRATORY_TRACT | Status: DC

## 2012-04-10 MED ORDER — SODIUM CHLORIDE 0.9 % IJ SOLN
3.0000 mL | Freq: Two times a day (BID) | INTRAMUSCULAR | Status: DC
  Administered 2012-04-10: 3 mL via INTRAVENOUS
  Administered 2012-04-11: 15:00:00 via INTRAVENOUS
  Administered 2012-04-12 – 2012-04-15 (×2): 3 mL via INTRAVENOUS

## 2012-04-10 MED ORDER — ALBUTEROL SULFATE (5 MG/ML) 0.5% IN NEBU
2.5000 mg | INHALATION_SOLUTION | RESPIRATORY_TRACT | Status: DC | PRN
  Administered 2012-04-10: 2.5 mg via RESPIRATORY_TRACT

## 2012-04-10 MED ORDER — POTASSIUM CHLORIDE CRYS ER 20 MEQ PO TBCR
40.0000 meq | EXTENDED_RELEASE_TABLET | Freq: Once | ORAL | Status: AC
  Administered 2012-04-10: 40 meq via ORAL
  Filled 2012-04-10: qty 2

## 2012-04-10 MED ORDER — GUAIFENESIN ER 600 MG PO TB12
600.0000 mg | ORAL_TABLET | Freq: Two times a day (BID) | ORAL | Status: DC
  Administered 2012-04-10 – 2012-04-15 (×10): 600 mg via ORAL
  Filled 2012-04-10 (×11): qty 1

## 2012-04-10 MED ORDER — METHYLPREDNISOLONE SODIUM SUCC 125 MG IJ SOLR
INTRAMUSCULAR | Status: AC
  Filled 2012-04-10: qty 2

## 2012-04-10 MED ORDER — METHYLPREDNISOLONE SODIUM SUCC 125 MG IJ SOLR
125.0000 mg | Freq: Four times a day (QID) | INTRAMUSCULAR | Status: DC
  Administered 2012-04-10 – 2012-04-11 (×4): 125 mg via INTRAVENOUS
  Filled 2012-04-10 (×8): qty 2

## 2012-04-10 MED ORDER — ONDANSETRON HCL 4 MG/2ML IJ SOLN: 4.0000 mg | Freq: Three times a day (TID) | INTRAMUSCULAR | Status: AC | PRN

## 2012-04-10 MED ORDER — ALBUTEROL SULFATE (5 MG/ML) 0.5% IN NEBU
2.5000 mg | INHALATION_SOLUTION | RESPIRATORY_TRACT | Status: DC
  Administered 2012-04-10: 2.5 mg via RESPIRATORY_TRACT
  Filled 2012-04-10 (×2): qty 0.5

## 2012-04-10 MED ORDER — ZOLPIDEM TARTRATE 10 MG PO TABS
10.0000 mg | ORAL_TABLET | Freq: Every evening | ORAL | Status: DC | PRN
  Administered 2012-04-10 – 2012-04-14 (×5): 10 mg via ORAL
  Filled 2012-04-10 (×5): qty 1

## 2012-04-10 MED ORDER — MONTELUKAST SODIUM 10 MG PO TABS
10.0000 mg | ORAL_TABLET | Freq: Every day | ORAL | Status: DC
  Administered 2012-04-10 – 2012-04-14 (×5): 10 mg via ORAL
  Filled 2012-04-10 (×7): qty 1

## 2012-04-10 MED ORDER — ALBUTEROL SULFATE (5 MG/ML) 0.5% IN NEBU
2.5000 mg | INHALATION_SOLUTION | Freq: Once | RESPIRATORY_TRACT | Status: AC
  Administered 2012-04-10: 2.5 mg via RESPIRATORY_TRACT
  Filled 2012-04-10: qty 1

## 2012-04-10 MED ORDER — ALBUTEROL SULFATE (5 MG/ML) 0.5% IN NEBU
2.5000 mg | INHALATION_SOLUTION | Freq: Once | RESPIRATORY_TRACT | Status: AC
  Administered 2012-04-10: 2.5 mg via RESPIRATORY_TRACT
  Filled 2012-04-10: qty 0.5

## 2012-04-10 MED ORDER — BUDESONIDE-FORMOTEROL FUMARATE 160-4.5 MCG/ACT IN AERO
2.0000 | INHALATION_SPRAY | Freq: Two times a day (BID) | RESPIRATORY_TRACT | Status: DC
  Administered 2012-04-10 – 2012-04-15 (×10): 2 via RESPIRATORY_TRACT
  Filled 2012-04-10 (×2): qty 6

## 2012-04-10 MED ORDER — ALBUTEROL SULFATE (5 MG/ML) 0.5% IN NEBU
INHALATION_SOLUTION | RESPIRATORY_TRACT | Status: AC
  Filled 2012-04-10: qty 2

## 2012-04-10 MED ORDER — ENOXAPARIN SODIUM 40 MG/0.4ML ~~LOC~~ SOLN
40.0000 mg | SUBCUTANEOUS | Status: DC
  Administered 2012-04-10 – 2012-04-14 (×5): 40 mg via SUBCUTANEOUS
  Filled 2012-04-10 (×6): qty 0.4

## 2012-04-10 MED ORDER — ALBUTEROL SULFATE (5 MG/ML) 0.5% IN NEBU
10.0000 mg | INHALATION_SOLUTION | Freq: Once | RESPIRATORY_TRACT | Status: AC
  Administered 2012-04-10: 10 mg via RESPIRATORY_TRACT
  Filled 2012-04-10: qty 2

## 2012-04-10 MED ORDER — IPRATROPIUM BROMIDE 0.02 % IN SOLN
RESPIRATORY_TRACT | Status: AC
  Filled 2012-04-10: qty 2.5

## 2012-04-10 MED ORDER — SODIUM CHLORIDE 0.9 % IV SOLN: INTRAVENOUS | Status: DC

## 2012-04-10 MED ORDER — POTASSIUM CHLORIDE CRYS ER 20 MEQ PO TBCR
40.0000 meq | EXTENDED_RELEASE_TABLET | Freq: Two times a day (BID) | ORAL | Status: AC
  Administered 2012-04-10 – 2012-04-11 (×2): 40 meq via ORAL
  Filled 2012-04-10 (×2): qty 2

## 2012-04-10 NOTE — ED Notes (Signed)
Pt reports she still does not have urge to urinate. Pt informed another urine is still needed and to call RN when she can.

## 2012-04-10 NOTE — ED Notes (Signed)
Respiratory paged for Racepinephrine

## 2012-04-10 NOTE — ED Notes (Signed)
Attempted to call report x1, RN off the floor transporting a pt. Will return call when back on floor.

## 2012-04-10 NOTE — ED Notes (Signed)
Attempted to call report x2, no answer.

## 2012-04-10 NOTE — ED Notes (Signed)
Per EMS:  Pt's at home neb has been broken for 2 days and pt has been progressively short of breath over those last 2 days.  When EMS got there pt was unable to speak in complete sentences, inspiratory and expiratory wheezes in all fields.  Pt had a total of 10 of albuterol and 0.5 of atrovent.  Pt also had 125 of solu-medrol, 20g in L AC.  Pt's respiratory effort has improved after treatment, only expiratory wheezes remains.  Pt also reports productive cough with yellow phlegm, pt is hot to touch.

## 2012-04-10 NOTE — Progress Notes (Signed)
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

## 2012-04-10 NOTE — ED Notes (Addendum)
Pt given graham crackers, chips and ginger ale per request. Updated on status of bed and plan of care. Verbalizes understanding and agreement.

## 2012-04-10 NOTE — Progress Notes (Signed)
PCP:  Georganna Skeans, MD   DOA:  04/10/2012  4:42 AM Patient admission triage note:   History As per ED provider 41 y.o. female presented with wheezing and SOB related to asthma exacerbation.  Patient says she has a sore throat but points low to the epiglottic area and states that she has not eaten in 2 days because the pain is so profound. Patient's home nebulizer is broken for several days and she's gotten progressively worse since then. She endorses subjective fever and productive cough. Patient also points low into her throat at the epiglottis region and says it is painful there. Patient has received 4 nebulizer treatment with racemic Epinephrine and 125 mg of Solu-Medrol in addition to the nebulizers have contained racemic epinephrine. She still unable to speak in complete set sentences and has diffuse expiratory wheezing.    Allergies: No Known Allergies  Prior to Admission medications   Medication Sig Start Date End Date Taking? Authorizing Provider  albuterol (PROVENTIL HFA;VENTOLIN HFA) 108 (90 BASE) MCG/ACT inhaler Inhale 2 puffs into the lungs every 6 (six) hours as needed. Shortness of breath and wheezing   Yes Historical Provider, MD  albuterol (PROVENTIL) (2.5 MG/3ML) 0.083% nebulizer solution Take 2.5 mg by nebulization every 6 (six) hours as needed. For shortness of breath   Yes Historical Provider, MD  HYDROcodone-acetaminophen (LORTAB) 7.5-500 MG/15ML solution Take 15 mLs by mouth every 6 (six) hours as needed for pain. 04/02/12 04/12/12 Yes Sunnie Nielsen, MD  ibuprofen (ADVIL,MOTRIN) 800 MG tablet Take 1 tablet (800 mg total) by mouth 3 (three) times daily. 04/02/12 04/12/12 Yes Sunnie Nielsen, MD  Multiple Vitamins-Minerals (MULTIVITAMIN GUMMIES ADULT PO) Take by mouth daily.   Yes Historical Provider, MD  predniSONE (DELTASONE) 20 MG tablet Take 3 tabs daily for 4 days, then take 2 tabs daily for 4 days, then take 1 tab daily for 4 days, then STOP 03/15/12  Yes Osvaldo Shipper, MD    penicillin v potassium (VEETID) 500 MG tablet Take 1 tablet (500 mg total) by mouth 4 (four) times daily. 04/02/12 04/09/12  Sunnie Nielsen, MD    Past Medical History  Diagnosis Date  . Asthma   . Seasonal allergies   . Arthritis     Past Surgical History  Procedure Date  . Cholecystectomy   . Cesarean section   . Tubal ligation     Social History:  reports that she has quit smoking. She has never used smokeless tobacco. She reports that she does not drink alcohol or use illicit drugs.  Family History  Problem Relation Age of Onset  . Hypertension Mother   . Diabetes Mother   . Cancer Mother       Physical Exam:  Filed Vitals:   04/10/12 0444 04/10/12 0502 04/10/12 0602 04/10/12 0751  BP:  132/72  128/78  Pulse:  102  65  Temp:  98.1 F (36.7 C)  98.5 F (36.9 C)  TempSrc:  Oral  Oral  Resp:  20  17  SpO2: 99% 100% 100% 100%      Labs on Admission:  No results found for this or any previous visit (from the past 48 hour(s)).  Radiological Exams on Admission: cxr : wnl Xray soft tissue neck : normal  Assessment Acute asthma exacerbation Patient with recurrent exacerbation of asthma presents with similar exacerbation not improved with steroids, 4 rounds of nebs and a dose of recemic pinephrine in the ED. Vitals stable but with extensive wheezing. Not in respiratory distress and sats  normal . No stridor.  Patient c/o pain over her epiglottic area with some muffled voice but able to swallow and hold secretions.CXR normal. Suggest no signs of acute epiglottitis at present Labs pending. Recommend admit to SDU to monitor for any airway compromise and need for acute airway protection.     Coalton Arch 04/10/2012, 8:45 AM

## 2012-04-10 NOTE — H&P (Addendum)
[facility] History and Physical  Wendy James ZOX:[id number] DOB: [birth year] DOA: 04/10/2012  Referring physician: ED PCP: Georganna Skeans M.D.  Chief Complaint: Shortness of breath with wheezing since 2 days  HPI:  41 year old, African American female, with a past medical history of asthma, never intubated with about 3-4 episodes of exacerbations every year for past several years with a recent hospitalization for asthma exacerbation came to the ED acute shortness of breath and wheezing for last 2 days. She informs that 2 days ago she developed a sore throat after which she was having persistent cough and wheezing. She used her home inhalers but it did not help and this morning she woke up from sleep with severe wheezing. She denies any fever or chills, denies any hoarseness of voice did have a few episodes of nausea and vomiting. She denies any chest pain or palpitations, headache or blurry vision. Sig denies any sick contacts, exposure to smoke, pets at home, use of new linen or rugs. Denies any change in the environment. She informs having a mild asthma attack about 10 days back and was prescribed a course of by mouth prednisone which she still taking at a lower dose. In the ED she was given IV Solu-Medrol followed by 4 ounce off albuterol and Atrovent nebs which did not improve her symptoms much and was still extensively wheezy. She was given a dose of racemic epinephrine and magnesium. A chest x-ray was obtained in the ED which was unremarkable. Patient also complained off throat pain but denied any difficulty swallowing or having excessive secretions from her mouth. A lateral view x-ray of the neck was obtained to check for epiglottitis and was unremarkable. [facility] called to admit patient for acute asthma exacerbation.  On my evaluation in the ED patient feels much better and denies any throat pain at this time. She denies any difficulty swallowing however has spells of dry  cough.  -Of note She was seen by pulmonary (Dr. Molli Knock) as a consult when she was admitted in January 2012 for acute asthma exacerbation. At that time she had a pulmonary function test done and was placed on Advair which made her symptoms worse. She was thought to have some vocal cord dysfunction as well as severe GERD. She was discharged home with outpatient pulmonary followup but is unclear if she did followup.  Review of Systems:  Constitutional: Denies fever, chills, diaphoresis, appetite change and fatigue.  HEENT: sore throat +, dry cough, Denies photophobia, eye pain, redness, hearing loss, ear pain, congestion,   rhinorrhea, sneezing, mouth sores, trouble swallowing, neck pain, neck stiffness and tinnitus.   Respiratory:  SOB,  cough, chest tightness,  and wheezing.   Cardiovascular: Denies chest pain, palpitations and leg swelling.  Gastrointestinal: Denies nausea, vomiting, abdominal pain, diarrhea, constipation, blood in stool and abdominal distention.  Genitourinary: Denies dysuria, urgency, frequency, hematuria, flank pain and difficulty urinating.  Musculoskeletal: Denies myalgias, back pain, joint swelling, arthralgias and gait problem.  Skin: Denies pallor, rash and wound.  Neurological: Denies dizziness, seizures, syncope, weakness, light-headedness, numbness and headaches.  Hematological: Denies adenopathy. Easy bruising, personal or family bleeding history  Psychiatric/Behavioral: Denies suicidal ideation, mood changes, confusion, nervousness, sleep disturbance and agitation   Past Medical History  Diagnosis Date  . Asthma   . Seasonal allergies   . Arthritis    Past Surgical History  Procedure Date  . Cholecystectomy   . Cesarean section   . Tubal ligation    Social History:  reports  that she has quit smoking. She has never used smokeless tobacco. She reports that she does not drink alcohol or use illicit drugs.  No Known Allergies  Family History  Problem  Relation Age of Onset  . Hypertension Mother   . Diabetes Mother   . Cancer Mother     Prior to Admission medications   Medication Sig Start Date End Date Taking? Authorizing Provider  albuterol (PROVENTIL HFA;VENTOLIN HFA) 108 (90 BASE) MCG/ACT inhaler Inhale 2 puffs into the lungs every 6 (six) hours as needed. Shortness of breath and wheezing   Yes Historical Provider, MD  albuterol (PROVENTIL) (2.5 MG/3ML) 0.083% nebulizer solution Take 2.5 mg by nebulization every 6 (six) hours as needed. For shortness of breath   Yes Historical Provider, MD  HYDROcodone-acetaminophen (LORTAB) 7.5-500 MG/15ML solution Take 15 mLs by mouth every 6 (six) hours as needed for pain. 04/02/12 04/12/12 Yes Sunnie Nielsen, MD  ibuprofen (ADVIL,MOTRIN) 800 MG tablet Take 1 tablet (800 mg total) by mouth 3 (three) times daily. 04/02/12 04/12/12 Yes Sunnie Nielsen, MD  Multiple Vitamins-Minerals (MULTIVITAMIN GUMMIES ADULT PO) Take by mouth daily.   Yes Historical Provider, MD  predniSONE (DELTASONE) 20 MG tablet Take 3 tabs daily for 4 days, then take 2 tabs daily for 4 days, then take 1 tab daily for 4 days, then STOP 03/15/12  Yes Osvaldo Shipper, MD  penicillin v potassium (VEETID) 500 MG tablet Take 1 tablet (500 mg total) by mouth 4 (four) times daily. 04/02/12 04/09/12  Sunnie Nielsen, MD    Physical Exam:  Filed Vitals:   04/10/12 0502 04/10/12 0602 04/10/12 0751 04/10/12 1206  BP: 132/72  128/78 127/72  Pulse: 102  65 85  Temp: 98.1 F (36.7 C)  98.5 F (36.9 C) 98.6 F (37 C)  TempSrc: Oral  Oral   Resp: 20  17 20   SpO2: 100% 100% 100% 100%    Constitutional: Vital signs reviewed.  Patient is a well-developed and well-nourished in no acute distress and cooperative with exam. Alert and oriented x3.  Head: Normocephalic and atraumatic Ear: TM normal bilaterally Mouth: no erythema or exudates, MMM Eyes: PERRL, EOMI, conjunctivae normal, No scleral icterus.  Neck: Supple, Trachea midline normal ROM, No JVD, mass,  thyromegaly, or carotid bruit present.  Cardiovascular: RRR, S1 normal, S2 normal, no MRG, pulses symmetric and intact bilaterally Pulmonary/Chest: Equal air entry bilaterally,  expiratory wheezes over her lung bases. Abdominal: Soft. Non-tender, non-distended, bowel sounds are normal, no masses, organomegaly, or guarding present.  GU: no CVA tenderness Musculoskeletal: No joint deformities, erythema, or stiffness, ROM full and no nontender Ext: no edema and no cyanosis, pulses palpable bilaterally (DP and PT) Hematology: no cervical, inginal, or axillary adenopathy.  Neurological: A&O x3, Strenght is normal and symmetric bilaterally, cranial nerve II-XII are grossly intact, no focal motor deficit, sensory intact to light touch bilaterally.  Skin: Warm, dry and intact. No rash, cyanosis, or clubbing.  Psychiatric: Normal mood and affect. speech and behavior is normal. Judgment and thought content normal. Cognition and memory are normal.   Labs on Admission:  Basic Metabolic Panel:  Lab 04/10/12 1610  NA 138  K 2.8*  CL 104  CO2 23  GLUCOSE 173*  BUN 10  CREATININE 0.54  CALCIUM 8.2*  MG 1.6  PHOS --   Liver Function Tests: No results found for this basename: AST:5,ALT:5,ALKPHOS:5,BILITOT:5,PROT:5,ALBUMIN:5 in the last 168 hours No results found for this basename: LIPASE:5,AMYLASE:5 in the last 168 hours No results found for this  basename: AMMONIA:5 in the last 168 hours CBC:  Lab 04/10/12 0910  WBC 5.3  NEUTROABS 4.5  HGB 11.4*  HCT 33.9*  MCV 86.5  PLT 251   Cardiac Enzymes: No results found for this basename: CKTOTAL:5,CKMB:5,CKMBINDEX:5,TROPONINI:5 in the last 168 hours BNP: No components found with this basename: POCBNP:5 CBG: No results found for this basename: GLUCAP:5 in the last 168 hours  Radiological Exams on Admission: Dg Neck Soft Tissue  04/10/2012  *RADIOLOGY REPORT*  Clinical Data: Cough and shortness of breath.  Tightness in the neck.  NECK SOFT  TISSUES - 1+ VIEW  Comparison: CT head 03/15/2012  Findings: The hypopharynx and cervical airway appear patent.  No prevertebral or submental soft tissue swelling.  The epiglottis is compressed and therefore partially obscured but there is no evidence of significant thickening of the epiglottis or aryepiglottic folds.  Degenerative changes in the cervical spine. No radiopaque foreign bodies.  IMPRESSION: No evidence of hypopharyngeal obstruction or inflammation.   Original Report Authenticated By: Marlon Pel, M.D.    Dg Chest 2 View  04/10/2012  *RADIOLOGY REPORT*  Clinical Data: Asthma.  Cough and congestion.  CHEST - 2 VIEW  Comparison: 03/12/2012.  Findings: The heart size and pulmonary vascularity are normal. The lungs appear clear and expanded without focal air space disease or consolidation. No blunting of the costophrenic angles.  No pneumothorax.  Mediastinal contours appear intact. No significant change since previous study.  IMPRESSION: No evidence of active pulmonary disease.   Original Report Authenticated By: Marlon Pel, M.D.       Assessment/Plan Principal Problem:  *Acute asthma exacerbation Patient will be admitted to telemetry. Unclear cause of or triggers her asthma. She informs off a recent sore throat. She is now more stable after receiving IV stood, nebs and racemic epinephrine in the ED. I will place her on IV Solu Medrol 125 mg every 6 hours, scheduled albuterol and Atrovent nebs every 4 hours and every 6 hours as needed. Continue with albuterol inhaler. I will start her on Symbicort and Singulair as well. -Continue with daily peak flow. -Given her recurrence of symptoms and not frequent asthmatic attacks several times a year she would benefit from evaluation by a pulmonologist. -She has a history all for smoking in last week she informs of quitting and also history of cocaine use.  Active Problems:  Acute respiratory failure Currently improved. Continue O2  via nasal cannula and scheduled and nebs.   Hypokalemia Will replenish with KCl in IV fluids and also order by mouth KCl 40 mg twice a day. Check magnesium level and monitor labs in the morning.  History of GERD We'll add Protonix.  DVT prophylaxis: Subcutaneous Lovenox  Diet: Regular  Code Status: Full code Family Communication: None at bedside Disposition Plan: Home once stable  Eddie North [facility] Pager [phone]  If 7PM-7AM, please contact night-coverage [email or web address] Password [facility] 04/10/2012, 12:30 PM

## 2012-04-10 NOTE — ED Provider Notes (Signed)
History     CSN: [id number]  Arrival date & time 04/10/12  0442   None     Chief Complaint  Patient presents with  . Asthma attack     (Consider location/radiation/quality/duration/timing/severity/associated sxs/prior treatment) HPI  41 y.o. female in moderate amount of distress complaining of asthma exacerbation. Supplies for asthma exacerbations in the past but has never had an intubation. Patient says she has a sore throat but points low to the epiglottic area and states that she has not eaten in 2 days because the pain is so profound. Patient's home nebulizer is broken for several days and she's gotten progressively worse since then. She endorses subjective fever and productive cough. Patient also points low into her throat at the epiglottis region and says it is painful there. Patient has received 4 nebulizer treatment with racemic Epinephrine and 125 mg of Solu-Medrol in addition to the nebulizers have contained racemic epinephrine. She still unable to speak in complete set sentences and has diffuse expiratory wheezing.   Past Medical History  Diagnosis Date  . Asthma   . Seasonal allergies   . Arthritis     Past Surgical History  Procedure Date  . Cholecystectomy   . Cesarean section   . Tubal ligation     Family History  Problem Relation Age of Onset  . Hypertension Mother   . Diabetes Mother   . Cancer Mother     History  Substance Use Topics  . Smoking status: Former Games developer  . Smokeless tobacco: Never Used  . Alcohol Use: No    OB History    Grav Para Term Preterm Abortions TAB SAB Ect Mult Living   5 4 4  0 1 0 1 0 0 3      Review of Systems  Constitutional: Positive for fever.  Respiratory: Positive for shortness of breath and wheezing.   Cardiovascular: Negative for chest pain.  Gastrointestinal: Negative for nausea, vomiting, abdominal pain and diarrhea.  All other systems reviewed and are negative.    Allergies  Review of patient's  allergies indicates no known allergies.  Home Medications   Current Outpatient Rx  Name Route Sig Dispense Refill  . ALBUTEROL SULFATE HFA 108 (90 BASE) MCG/ACT IN AERS Inhalation Inhale 2 puffs into the lungs every 6 (six) hours as needed. Shortness of breath and wheezing    . ALBUTEROL SULFATE (2.5 MG/3ML) 0.083% IN NEBU Nebulization Take 2.5 mg by nebulization every 6 (six) hours as needed. For shortness of breath    . HYDROCODONE-ACETAMINOPHEN 7.5-500 MG/15ML PO SOLN Oral Take 15 mLs by mouth every 6 (six) hours as needed for pain. 60 mL 0  . IBUPROFEN 800 MG PO TABS Oral Take 1 tablet (800 mg total) by mouth 3 (three) times daily. 21 tablet 0  . MULTIVITAMIN GUMMIES ADULT PO Oral Take by mouth daily.    Marland Kitchen PREDNISONE 20 MG PO TABS  Take 3 tabs daily for 4 days, then take 2 tabs daily for 4 days, then take 1 tab daily for 4 days, then STOP 24 tablet 0  . PENICILLIN V POTASSIUM 500 MG PO TABS Oral Take 1 tablet (500 mg total) by mouth 4 (four) times daily. 40 tablet 0    BP 132/72  Pulse 102  Temp 98.1 F (36.7 C) (Oral)  Resp 20  SpO2 100%  LMP 03/14/2012  Physical Exam  Nursing note and vitals reviewed. Constitutional: She is oriented to person, place, and time. She appears well-developed and well-nourished. No  distress.       Voice is extremely hoarse.  HENT:  Head: Normocephalic and atraumatic.  Mouth/Throat: Oropharynx is clear and moist. No oropharyngeal exudate.       No tonsillar hypertrophy or exudate, uvula is midline, patient is controlling her secretions.  Eyes: Conjunctivae normal and EOM are normal.  Cardiovascular: Normal rate, regular rhythm and normal heart sounds.   Pulmonary/Chest: Effort normal. No stridor. No respiratory distress. She has wheezes. She has no rales. She exhibits no tenderness.       Patient is able to recline to about 45 and not tripoding. However she is unable to speak in complete sentences using 3-4 word burst. She has diffuse expiratory  wheezing with no stridor and no accessory muscle use.  Abdominal: Soft. Bowel sounds are normal. She exhibits no distension and no mass. There is no tenderness. There is no rebound and no guarding.  Musculoskeletal: Normal range of motion.  Lymphadenopathy:    She has cervical adenopathy.  Neurological: She is alert and oriented to person, place, and time.  Skin: Skin is warm.  Psychiatric: She has a normal mood and affect.    ED Course  Procedures (including critical care time)  Labs Reviewed - No data to display Dg Neck Soft Tissue  04/10/2012  *RADIOLOGY REPORT*  Clinical Data: Cough and shortness of breath.  Tightness in the neck.  NECK SOFT TISSUES - 1+ VIEW  Comparison: CT head 03/15/2012  Findings: The hypopharynx and cervical airway appear patent.  No prevertebral or submental soft tissue swelling.  The epiglottis is compressed and therefore partially obscured but there is no evidence of significant thickening of the epiglottis or aryepiglottic folds.  Degenerative changes in the cervical spine. No radiopaque foreign bodies.  IMPRESSION: No evidence of hypopharyngeal obstruction or inflammation.   Original Report Authenticated By: Marlon Pel, M.D.    Dg Chest 2 View  04/10/2012  *RADIOLOGY REPORT*  Clinical Data: Asthma.  Cough and congestion.  CHEST - 2 VIEW  Comparison: 03/12/2012.  Findings: The heart size and pulmonary vascularity are normal. The lungs appear clear and expanded without focal air space disease or consolidation. No blunting of the costophrenic angles.  No pneumothorax.  Mediastinal contours appear intact. No significant change since previous study.  IMPRESSION: No evidence of active pulmonary disease.   Original Report Authenticated By: Marlon Pel, M.D.     Date: 04/10/2012  Rate: 82  Rhythm: normal sinus rhythm and premature ventricular contractions (PVC)  QRS Axis: left  Intervals: normal  ST/T Wave abnormalities: normal  Conduction  Disutrbances:none  Narrative Interpretation:   Old EKG Reviewed: changes noted PVCs are new    1. Acute asthma exacerbation       MDM  The patient is controlling her secretions and oropharynx exam is normal and concern for epiglottitis and will order a lateral soft tissue neck to rule it out as she is complaining of severe sore throat and describes the pain as quite low in the  epiglottic region.  Patient presenting with worsening shortness of breath and asthma exacerbation with subjective fever and productive cough. Chest x-ray is normal patient has received 125 mg of Solu-Medrol IV with multiple nebulizers of both albuterol and racemic epinephrine with minimal improvement in her condition. Patient will receive magnesium and also to low meds but believes she needs to be admitted for further monitoring.   Hospitalist consultation by Dr. Gonzella Lex appreciated he will admit her to a step down unit under [facility]  team 8 admitting physician will be S. Patrick North Katrinia Straker, PA-C 04/10/12 0900

## 2012-04-10 NOTE — Progress Notes (Signed)
Patient received as admission from ED. Patient is awake alert oriented x3.  Call bell within reach.  Instructed patient to use call bell when she needed something.  Bed in low position.  Assessed for questions at this time.  No further questions.  Patient resting comfortably.

## 2012-04-11 LAB — BASIC METABOLIC PANEL
CO2: 23 mEq/L (ref 19–32)
Calcium: 9.1 mg/dL (ref 8.4–10.5)
Creatinine, Ser: 0.48 mg/dL — ABNORMAL LOW (ref 0.50–1.10)
Glucose, Bld: 162 mg/dL — ABNORMAL HIGH (ref 70–99)

## 2012-04-11 MED ORDER — ONDANSETRON HCL 4 MG/2ML IJ SOLN: 4.0000 mg | Freq: Four times a day (QID) | INTRAMUSCULAR | Status: DC | PRN

## 2012-04-11 MED ORDER — METHYLPREDNISOLONE SODIUM SUCC 125 MG IJ SOLR
60.0000 mg | Freq: Three times a day (TID) | INTRAMUSCULAR | Status: DC
  Administered 2012-04-11 – 2012-04-14 (×9): 60 mg via INTRAVENOUS
  Filled 2012-04-11 (×13): qty 0.96

## 2012-04-11 NOTE — Progress Notes (Signed)
[facility] PROGRESS NOTE  Wendy James:[id number] DOB: July 04, 1971 DOA: 04/10/2012 PCP: Georganna Skeans, MD  Assessment/Plan:  Principle problem *Acute asthma exacerbation  continue telemetry.  Unclear cause of or triggers her asthma. She informs off a recent sore throat.  -Placed on IV Solu Medrol, scheduled nebs and added Symbicort as well as Singulair. Clinically much improved today. Will reduce Solu Medrol dose -Continue he peak flow monitoring. -Given her recurrence of symptoms and not frequent asthmatic attacks several times a year she would benefit from evaluation by a pulmonologist. Of note she was seen in 2012 by pulmonary consult and started on Symbicort and was recommended for outpatient pulmonary followup which she never did. I will get a pulmonary clinic appointment prior to her discharge.   Active Problems:  Acute respiratory failure  Currently improved. Continue O2 via nasal cannula and scheduled and nebs.   Hypokalemia  Replenished with KCl.  History of GERD  Added Protonix  DVT prophylaxis: Subcutaneous Lovenox   Diet: Regular     Code Status: Full Code Family Communication: None at bedside Disposition Plan: Home once stable likely 1-2 days   Brief narrative: 41 year old female with history of asthma with recurrent exacerbation presents again with this acute severe asthma.  Consultants:  None  Procedures:  None  Antibiotics:  None  HPI/Subjective: Feels her breathing to be much better today  Objective: Filed Vitals:   04/10/12 1955 04/10/12 2119 04/11/12 0539 04/11/12 0752  BP:  137/77 143/85   Pulse:  85 71   Temp:  98.4 F (36.9 C) 98.5 F (36.9 C)   TempSrc:  Oral Oral   Resp:  18 18   Height:      Weight:      SpO2: 99% 97% 97% 97%    Intake/Output Summary (Last 24 hours) at 04/11/12 1034 Last data filed at 04/11/12 8119  Gross per 24 hour  Intake 743.34 ml  Output   2400 ml  Net -1656.66 ml   Filed Weights   04/10/12 1645  Weight: 92.4 kg (203 lb 11.3 oz)    Exam:   General:  Middle aged obese female lying in bed in no acute distress  HEENT: No pallor, moist oral mucosa, no lymphadenopathy  Cardiovascular: Normal S1 and S2 no murmurs rub or gallop  Respiratory: Clear to auscultation bilaterally no wheezes  Abdomen: Soft nontender nondistended bowel sounds present  Extremities: Warm, no edema  CNS: AAO x3 nonfocal  Data Reviewed: Basic Metabolic Panel:  Lab 04/11/12 1478 04/10/12 0910  NA 136 138  K 4.0 2.8*  CL 104 104  CO2 23 23  GLUCOSE 162* 173*  BUN 6 10  CREATININE 0.48* 0.54  CALCIUM 9.1 8.2*  MG -- 1.6  PHOS -- --   Liver Function Tests: No results found for this basename: AST:5,ALT:5,ALKPHOS:5,BILITOT:5,PROT:5,ALBUMIN:5 in the last 168 hours No results found for this basename: LIPASE:5,AMYLASE:5 in the last 168 hours No results found for this basename: AMMONIA:5 in the last 168 hours CBC:  Lab 04/10/12 0910  WBC 5.3  NEUTROABS 4.5  HGB 11.4*  HCT 33.9*  MCV 86.5  PLT 251   Cardiac Enzymes: No results found for this basename: CKTOTAL:5,CKMB:5,CKMBINDEX:5,TROPONINI:5 in the last 168 hours BNP (last 3 results) No results found for this basename: PROBNP:3 in the last 8760 hours CBG:  Lab 04/10/12 1711  GLUCAP 89    No results found for this or any previous visit (from the past 240 hour(s)).   Studies: Dg Neck Soft Tissue  04/10/2012  *RADIOLOGY REPORT*  Clinical Data: Cough and shortness of breath.  Tightness in the neck.  NECK SOFT TISSUES - 1+ VIEW  Comparison: CT head 03/15/2012  Findings: The hypopharynx and cervical airway appear patent.  No prevertebral or submental soft tissue swelling.  The epiglottis is compressed and therefore partially obscured but there is no evidence of significant thickening of the epiglottis or aryepiglottic folds.  Degenerative changes in the cervical spine. No radiopaque foreign bodies.  IMPRESSION: No evidence of  hypopharyngeal obstruction or inflammation.   Original Report Authenticated By: Marlon Pel, M.D.    Dg Chest 2 View  04/10/2012  *RADIOLOGY REPORT*  Clinical Data: Asthma.  Cough and congestion.  CHEST - 2 VIEW  Comparison: 03/12/2012.  Findings: The heart size and pulmonary vascularity are normal. The lungs appear clear and expanded without focal air space disease or consolidation. No blunting of the costophrenic angles.  No pneumothorax.  Mediastinal contours appear intact. No significant change since previous study.  IMPRESSION: No evidence of active pulmonary disease.   Original Report Authenticated By: Marlon Pel, M.D.     Scheduled Meds:   . albuterol  2.5 mg Nebulization Q4H  . budesonide-formoterol  2 puff Inhalation BID  . enoxaparin (LOVENOX) injection  40 mg Subcutaneous Q24H  . guaiFENesin  600 mg Oral BID  . influenza  inactive virus vaccine  0.5 mL Intramuscular Tomorrow-1000  . ipratropium  0.5 mg Nebulization Q4H  . magnesium sulfate 1 - 4 g bolus IVPB  2 g Intravenous Once  . methylPREDNISolone (SOLU-MEDROL) injection  125 mg Intravenous Q6H  . montelukast  10 mg Oral QHS  . potassium chloride  40 mEq Oral BID  . potassium chloride  40 mEq Oral Once  . sodium chloride  3 mL Intravenous Q12H  . DISCONTD: albuterol  2.5 mg Nebulization Q4H   Continuous Infusions:   . sodium chloride    . 0.9 % NaCl with KCl 40 mEq / L 100 mL/hr at 04/11/12 1610      Time spent: 30 minutes    Messina Kosinski  [facility] Pager [phone]. If 8PM-8AM, please contact night-coverage at [email or web address], password [facility] 04/11/2012, 10:34 AM  LOS: 1 day

## 2012-04-12 DIAGNOSIS — J209 Acute bronchitis, unspecified: Secondary | ICD-10-CM | POA: Diagnosis present

## 2012-04-12 MED ORDER — AZITHROMYCIN 250 MG PO TABS
250.0000 mg | ORAL_TABLET | Freq: Every day | ORAL | Status: DC
  Administered 2012-04-13 – 2012-04-15 (×3): 250 mg via ORAL
  Filled 2012-04-12 (×3): qty 1

## 2012-04-12 MED ORDER — AZITHROMYCIN 500 MG PO TABS
500.0000 mg | ORAL_TABLET | Freq: Every day | ORAL | Status: AC
  Administered 2012-04-12: 500 mg via ORAL
  Filled 2012-04-12: qty 1

## 2012-04-12 MED ORDER — HYDROCOD POLST-CHLORPHEN POLST 10-8 MG/5ML PO LQCR
5.0000 mL | Freq: Two times a day (BID) | ORAL | Status: DC | PRN
  Administered 2012-04-12 – 2012-04-14 (×3): 5 mL via ORAL
  Filled 2012-04-12 (×3): qty 5

## 2012-04-12 NOTE — Progress Notes (Signed)
[facility] PROGRESS NOTE  Wendy James:[id number] DOB: 1970/10/01 DOA: 04/10/2012 PCP: Georganna Skeans, MD   :Brief narrative:  41 year old female with history of asthma with recurrent exacerbation presents again with acute severe asthma.    Assessment/Plan Principle problem  *Acute asthma exacerbation   Unclear cause of or triggers her asthma. She informs of a recent sore throat.  -Placed on IV Solu Medrol, scheduled nebs and added Symbicort as well as Singulair. Slowly distress clinically improving. However was in the setting and wheezing overnight. Continue scheduled meds and current dose of Solu Medrol. Given persistent cough I will add a course of Z-Pak for her possible underlying bronchitis. -Continue peak flow monitoring.  -Given her recurrence of symptoms and not frequent asthmatic attacks several times a year she would benefit from evaluation by a pulmonologist. Of note she was seen in 2012 by pulmonary consult and started on Symbicort and was recommended for outpatient pulmonary followup which she never did. I will get a pulmonary clinic appointment prior to her discharge.  -O2 via nasal cannula  Active Problems:  Acute respiratory failure  Currently improved. Continue O2 via nasal cannula and scheduled and nebs.   Acute Bronchitis Patient having persistent cough . I will add a course of Z-Pak.  Hypokalemia  Replenished with KCl.   History of GERD  Added Protonix   DVT prophylaxis: Subcutaneous Lovenox  Diet: Regular    Code Status: Full Code  Family Communication: None at bedside  Disposition Plan: Patient still symptomatic and currently inpatient. Discharge home once stable   Consultants:  None  Procedures:  None  Antibiotics:  Started on Z-Pak today (9/14)  HPI/Subjective: Informs increased wheezing overnight and persistent cough.  Objective: Filed Vitals:   04/11/12 1934 04/11/12 2200 04/12/12 0700 04/12/12 0829  BP:  132/85 143/85     Pulse:  88 64   Temp:  98.5 F (36.9 C) 98 F (36.7 C)   TempSrc:  Oral Oral   Resp:  18 18   Height:      Weight:      SpO2: 97% 99% 98% 98%   No intake or output data in the 24 hours ending 04/12/12 1010 Filed Weights   04/10/12 1645  Weight: 92.4 kg (203 lb 11.3 oz)    Exam:  General: Middle aged obese female lying in bed in no acute distress . Coughing frequently HEENT: No pallor, moist oral mucosa, no lymphadenopathy  Cardiovascular: Normal S1 and S2 no murmurs rub or gallop  Respiratory: Expiratory wheezes bilaterally. Abdomen: Soft nontender nondistended bowel sounds present  Extremities: Warm, no edema  CNS: AAO x3 nonfocal   Data Reviewed: Basic Metabolic Panel:  Lab 04/11/12 4401 04/10/12 0910  NA 136 138  K 4.0 2.8*  CL 104 104  CO2 23 23  GLUCOSE 162* 173*  BUN 6 10  CREATININE 0.48* 0.54  CALCIUM 9.1 8.2*  MG -- 1.6  PHOS -- --   Liver Function Tests: No results found for this basename: AST:5,ALT:5,ALKPHOS:5,BILITOT:5,PROT:5,ALBUMIN:5 in the last 168 hours No results found for this basename: LIPASE:5,AMYLASE:5 in the last 168 hours No results found for this basename: AMMONIA:5 in the last 168 hours CBC:  Lab 04/10/12 0910  WBC 5.3  NEUTROABS 4.5  HGB 11.4*  HCT 33.9*  MCV 86.5  PLT 251   Cardiac Enzymes: No results found for this basename: CKTOTAL:5,CKMB:5,CKMBINDEX:5,TROPONINI:5 in the last 168 hours BNP (last 3 results) No results found for this basename: PROBNP:3 in the last 8760 hours CBG:  Lab 04/10/12 1711  GLUCAP 89    No results found for this or any previous visit (from the past 240 hour(s)).   Studies: No results found.  Scheduled Meds:   . albuterol  2.5 mg Nebulization Q4H  . azithromycin  500 mg Oral Daily   Followed by  . azithromycin  250 mg Oral Daily  . budesonide-formoterol  2 puff Inhalation BID  . enoxaparin (LOVENOX) injection  40 mg Subcutaneous Q24H  . guaiFENesin  600 mg Oral BID  . influenza   inactive virus vaccine  0.5 mL Intramuscular Tomorrow-1000  . ipratropium  0.5 mg Nebulization Q4H  . methylPREDNISolone (SOLU-MEDROL) injection  60 mg Intravenous Q8H  . montelukast  10 mg Oral QHS  . potassium chloride  40 mEq Oral BID  . sodium chloride  3 mL Intravenous Q12H  . DISCONTD: methylPREDNISolone (SOLU-MEDROL) injection  125 mg Intravenous Q6H   Continuous Infusions:   . DISCONTD: sodium chloride    . DISCONTD: 0.9 % NaCl with KCl 40 mEq / L 100 mL/hr at 04/11/12 1610      Time spent: 30 minutes    Scheryl Sanborn  [facility] Pager [phone]. If 8PM-8AM, please contact night-coverage at [email or web address], password [facility] 04/12/2012, 10:10 AM  LOS: 2 days

## 2012-04-13 DIAGNOSIS — R030 Elevated blood-pressure reading, without diagnosis of hypertension: Secondary | ICD-10-CM

## 2012-04-13 MED ORDER — HYDRALAZINE HCL 10 MG PO TABS
10.0000 mg | ORAL_TABLET | Freq: Four times a day (QID) | ORAL | Status: DC | PRN
  Administered 2012-04-13 – 2012-04-14 (×3): 10 mg via ORAL
  Filled 2012-04-13 (×4): qty 1

## 2012-04-13 NOTE — Progress Notes (Signed)
[facility] PROGRESS NOTE  Wendy James ZOX:[id number] DOB: June 01, 1971 DOA: 04/10/2012 PCP: Georganna Skeans, MD   Brief narrative:  41 year old female with history of asthma with recurrent exacerbation presents again with acute severe asthma.   Assessment/Plan  Principle problem  *Acute asthma exacerbation  Unclear cause of or triggers her asthma. She informs of a recent sore throat.  -Placed on IV Solu Medrol, scheduled nebs and added Symbicort as well as Singulair. improving clincially.  Continue scheduled nebs  . Reduce solumedrol dose -Continue peak flow monitoring.  -Given her recurrence of symptoms and not frequent asthmatic attacks several times a year she would benefit from evaluation by a pulmonologist. Of note she was seen in 2012 by pulmonary consult and started on Symbicort and was recommended for outpatient pulmonary followup which she never did. I will get a pulmonary clinic appointment prior to her discharge.  -O2 via nasal cannula PRN  Active Problems:  Acute respiratory failure  Currently improved. Continue O2 via nasal cannula and scheduled and nebs.   Acute Bronchitis  Patient having persistent cough . Added z pak   Elevated BP  added prn hydralazine . Will monitor     Hypokalemia  Replenished with KCl.   History of GERD  Added Protonix   DVT prophylaxis: Subcutaneous Lovenox   Diet: Regular    Code Status: Full Code  Family Communication: None at bedside  Disposition Plan: Patient still symptomatic and currently inpatient. Discharge home once stable  Consultants:  none  Procedures:  none  Antibiotics:  z pack since 9/14  HPI/Subjective: Feels better today. Shill wheezy but less frequent and has cough.   Objective: Filed Vitals:   04/12/12 2200 04/12/12 2300 04/13/12 0600 04/13/12 0802  BP: 177/98 166/97 186/100   Pulse: 91 76 70   Temp: 98 F (36.7 C)  98.8 F (37.1 C)   TempSrc: Oral  Oral   Resp: 18  20   Height:        Weight:      SpO2: 95% 97% 96% 96%   No intake or output data in the 24 hours ending 04/13/12 0942 Filed Weights   04/10/12 1645  Weight: 92.4 kg (203 lb 11.3 oz)    Exam:  General: Middle aged obese female lying in bed in no acute distress . Coughing frequently  HEENT: No pallor, moist oral mucosa, no lymphadenopathy  Cardiovascular: Normal S1 and S2 no murmurs rub or gallop  Respiratory: Expiratory wheezes bilaterally.  Abdomen: Soft nontender nondistended bowel sounds present  Extremities: Warm, no edema  CNS: AAO x3 nonfocal   Data Reviewed: Basic Metabolic Panel:  Lab 04/11/12 8119 04/10/12 0910  NA 136 138  K 4.0 2.8*  CL 104 104  CO2 23 23  GLUCOSE 162* 173*  BUN 6 10  CREATININE 0.48* 0.54  CALCIUM 9.1 8.2*  MG -- 1.6  PHOS -- --   Liver Function Tests: No results found for this basename: AST:5,ALT:5,ALKPHOS:5,BILITOT:5,PROT:5,ALBUMIN:5 in the last 168 hours No results found for this basename: LIPASE:5,AMYLASE:5 in the last 168 hours No results found for this basename: AMMONIA:5 in the last 168 hours CBC:  Lab 04/10/12 0910  WBC 5.3  NEUTROABS 4.5  HGB 11.4*  HCT 33.9*  MCV 86.5  PLT 251   Cardiac Enzymes: No results found for this basename: CKTOTAL:5,CKMB:5,CKMBINDEX:5,TROPONINI:5 in the last 168 hours BNP (last 3 results) No results found for this basename: PROBNP:3 in the last 8760 hours CBG:  Lab 04/10/12 1711  GLUCAP 89  No results found for this or any previous visit (from the past 240 hour(s)).   Studies: No results found.  Scheduled Meds:   . albuterol  2.5 mg Nebulization Q4H  . azithromycin  500 mg Oral Daily   Followed by  . azithromycin  250 mg Oral Daily  . budesonide-formoterol  2 puff Inhalation BID  . enoxaparin (LOVENOX) injection  40 mg Subcutaneous Q24H  . guaiFENesin  600 mg Oral BID  . ipratropium  0.5 mg Nebulization Q4H  . methylPREDNISolone (SOLU-MEDROL) injection  60 mg Intravenous Q8H  . montelukast  10  mg Oral QHS  . sodium chloride  3 mL Intravenous Q12H   Continuous Infusions:      Time spent: 25 minutes    Daneli Butkiewicz  [facility] Pager [phone]. If 8PM-8AM, please contact night-coverage at [email or web address], password [facility] 04/13/2012, 9:42 AM  LOS: 3 days

## 2012-04-14 ENCOUNTER — Other Ambulatory Visit: Payer: Self-pay

## 2012-04-14 LAB — URINE MICROSCOPIC-ADD ON

## 2012-04-14 LAB — URINALYSIS, ROUTINE W REFLEX MICROSCOPIC
Glucose, UA: NEGATIVE mg/dL
Specific Gravity, Urine: 1.012 (ref 1.005–1.030)

## 2012-04-14 MED ORDER — HYDRALAZINE HCL 25 MG PO TABS
25.0000 mg | ORAL_TABLET | Freq: Four times a day (QID) | ORAL | Status: DC | PRN
  Filled 2012-04-14 (×2): qty 1

## 2012-04-14 MED ORDER — METHYLPREDNISOLONE SODIUM SUCC 125 MG IJ SOLR
60.0000 mg | INTRAMUSCULAR | Status: DC
  Administered 2012-04-15: 60 mg via INTRAVENOUS
  Filled 2012-04-14 (×2): qty 0.96

## 2012-04-14 NOTE — Progress Notes (Signed)
Patient had asked for assistance in obtaining her medications.  Windell Moulding, Case Manager, investigated , patient has received assistance within the last 12 months and is not currently eligible for assistance.  Spoke with financial counselor to go speak with her about options

## 2012-04-14 NOTE — Progress Notes (Signed)
Received consult to see pt in regard to assistance with medication. Pt does not qualify for indigent fund assistance for medication at this month d/t fact that she was assisted in 5/13. Assistance is offered once every 12 months. I however gave pt a prescription drug discount card to use when getting her prescriptions and also advised her to get her medications from discount pharmacies like [facility]. She verbalized understanding.

## 2012-04-14 NOTE — Progress Notes (Signed)
[facility] PROGRESS NOTE  Wendy James ZOX:[id number] DOB: 04-Jan-1971 DOA: 04/10/2012 PCP: Georganna Skeans, MD   Brief narrative:  41 year old female with history of asthma with recurrent exacerbation presents again with acute severe asthma.   Assessment/Plan  Principle problem  *Acute asthma exacerbation  Unclear cause of or triggers her asthma. She informs of a recent sore throat.  -Placed on IV Solu Medrol, scheduled nebs and added Symbicort as well as Singulair. improving clincially.  Continue scheduled nebs . ReduceD solumedrol dose  -Continue peak flow monitoring.  -Given her recurrence of symptoms and not frequent asthmatic attacks several times a year she would benefit from evaluation by a pulmonologist. Of note she was seen in 2012 by pulmonary consult and started on Symbicort and was recommended for outpatient pulmonary followup which she never did. doesnot have insurance or PCP.  -O2 via nasal cannula PRN   Active Problems:  Acute respiratory failure  Currently improved. Continue O2 via nasal cannula and scheduled and nebs.   Acute Bronchitis  improved with  z pak   Elevated BP  Increased dose of hydralazine. Will follow  Hypokalemia  Replenished with KCl.   History of GERD  Added Protonix  DVT prophylaxis: Subcutaneous Lovenox  Diet: Regular    Code Status: Full Code  Family Communication: None at bedside  Disposition Plan: Patient still symptomatic and currently inpatient. Discharge home once stable  Consultants:  none Procedures:  none Antibiotics:  z pack since 9/14    HPI/Subjective: Feels better today  Objective: Filed Vitals:   04/14/12 0645 04/14/12 0741 04/14/12 0748 04/14/12 1214  BP: 182/101 161/93    Pulse:  82    Temp:  99 F (37.2 C)    TempSrc:  Oral    Resp:  20    Height:      Weight:      SpO2:  97% 97% 96%   No intake or output data in the 24 hours ending 04/14/12 1525 Filed Weights   04/10/12 1645  Weight: 92.4  kg (203 lb 11.3 oz)    Exam:  General: Middle aged obese female lying in bed in no acute distress . HEENT: No pallor, moist oral mucosa, no lymphadenopathy  Cardiovascular: Normal S1 and S2 no murmurs rub or gallop  Respiratory: clear b/l Abdomen: Soft nontender nondistended bowel sounds present  Extremities: Warm, no edema  CNS: AAO x3 nonfocal   Data Reviewed: Basic Metabolic Panel:  Lab 04/11/12 8119 04/10/12 0910  NA 136 138  K 4.0 2.8*  CL 104 104  CO2 23 23  GLUCOSE 162* 173*  BUN 6 10  CREATININE 0.48* 0.54  CALCIUM 9.1 8.2*  MG -- 1.6  PHOS -- --   Liver Function Tests: No results found for this basename: AST:5,ALT:5,ALKPHOS:5,BILITOT:5,PROT:5,ALBUMIN:5 in the last 168 hours No results found for this basename: LIPASE:5,AMYLASE:5 in the last 168 hours No results found for this basename: AMMONIA:5 in the last 168 hours CBC:  Lab 04/10/12 0910  WBC 5.3  NEUTROABS 4.5  HGB 11.4*  HCT 33.9*  MCV 86.5  PLT 251   Cardiac Enzymes: No results found for this basename: CKTOTAL:5,CKMB:5,CKMBINDEX:5,TROPONINI:5 in the last 168 hours BNP (last 3 results) No results found for this basename: PROBNP:3 in the last 8760 hours CBG:  Lab 04/10/12 1711  GLUCAP 89    No results found for this or any previous visit (from the past 240 hour(s)).   Studies: No results found.  Scheduled Meds:   . albuterol  2.5  mg Nebulization Q4H  . azithromycin  250 mg Oral Daily  . budesonide-formoterol  2 puff Inhalation BID  . enoxaparin (LOVENOX) injection  40 mg Subcutaneous Q24H  . guaiFENesin  600 mg Oral BID  . ipratropium  0.5 mg Nebulization Q4H  . methylPREDNISolone (SOLU-MEDROL) injection  60 mg Intravenous Q24H  . montelukast  10 mg Oral QHS  . sodium chloride  3 mL Intravenous Q12H  . DISCONTD: methylPREDNISolone (SOLU-MEDROL) injection  60 mg Intravenous Q8H   Continuous Infusions:     Time spent: 30 minutes    Cay Kath  [facility] Pager  [phone]. If 8PM-8AM, please contact night-coverage at [email or web address], password [facility] 04/14/2012, 3:25 PM  LOS: 4 days

## 2012-04-15 MED ORDER — PREDNISONE 20 MG PO TABS: ORAL_TABLET | ORAL | Status: DC

## 2012-04-15 MED ORDER — AZITHROMYCIN 250 MG PO TABS: ORAL_TABLET | ORAL | Status: DC

## 2012-04-15 MED ORDER — OMEPRAZOLE MAGNESIUM 20 MG PO TBEC: 20.0000 mg | DELAYED_RELEASE_TABLET | Freq: Every day | ORAL | Status: DC

## 2012-04-15 MED ORDER — BUDESONIDE-FORMOTEROL FUMARATE 160-4.5 MCG/ACT IN AERO: 2.0000 | INHALATION_SPRAY | Freq: Two times a day (BID) | RESPIRATORY_TRACT | Status: DC

## 2012-04-15 MED ORDER — AMLODIPINE BESYLATE 10 MG PO TABS: 10.0000 mg | ORAL_TABLET | Freq: Every day | ORAL | Status: DC

## 2012-04-15 MED ORDER — HYDROCHLOROTHIAZIDE 25 MG PO TABS
25.0000 mg | ORAL_TABLET | Freq: Every day | ORAL | Status: DC
  Administered 2012-04-15: 25 mg via ORAL
  Filled 2012-04-15: qty 1

## 2012-04-15 NOTE — Progress Notes (Signed)
Patient discharged to home.  Reviewed discharge instructions and prescriptions with patient.  Patient without questions at this time.  IV removed from left antecube.  Patient received doctor's note for work.  Started mychart for patient.  Patient escorted to lobby via wheelchair.  Patient discharged.

## 2012-04-15 NOTE — Discharge Summary (Signed)
Physician Discharge Summary  Wendy James ZOX:[id number] DOB: 04-08-1971 DOA: 04/10/2012  PCP: Georganna Skeans, MD  Admit date: 04/10/2012 Discharge date: 04/15/2012  Recommendations for Outpatient Follow-up:  1. Discharge home. Patient is waiting for her new insurance and looking or signing a new PCP in the community.  Discharge Diagnoses:  Principal Problem:   *Acute asthma exacerbation  Active Problems:  Acute respiratory failure  Hypokalemia  Bronchitis, acute  Elevated BP   Discharge Condition: Fair  Diet recommendation: Low sodium  Filed Weights   04/10/12 1645  Weight: 92.4 kg (203 lb 11.3 oz)    History of present illness:   41 year old female with history of asthma with recurrent exacerbation presents again with acute severe asthma.    Hospital Course:   *Acute asthma exacerbation  Unclear cause of or triggers her asthma. She informs of a recent sore throat.  -Placed on IV Solu Medrol, scheduled nebs and added Symbicort as well as Singulair. improving clincially.  -Given her recurrence of symptoms and not frequent asthmatic attacks several times a year she would benefit from evaluation by a pulmonologist. Of note she was seen in 2012 by pulmonary consult and started on Symbicort and was recommended for outpatient pulmonary followup which she never did. doesnot have insurance or PCP.  -O2 via nasal cannula PRN   Active Problems:   Acute respiratory failure  Currently improved. Continue O2 via nasal cannula and scheduled and nebs.   Acute Bronchitis  improved with z pak   Elevated BP  Persistently elevated. Will discharge her on amlodipine 10 mg daily  Hypokalemia  Replenished with KCl.   History of GERD  Added Protonix      Consultants:  None   Procedures:  None   Antibiotics:  z pack since 9/14   Procedures:  None  Consultations:  None  Discharge Exam: Filed Vitals:   04/14/12 2257 04/15/12 0019 04/15/12 0645 04/15/12 0947    BP: 156/99  158/94   Pulse: 84  67   Temp: 98.7 F (37.1 C)  98.6 F (37 C)   TempSrc: Oral  Oral   Resp: 20  18   Height:      Weight:      SpO2: 97% 96% 94% 98%      Discharge Instructions  Discharge Orders    Future Appointments: Provider: Department: Dept Phone: Center:   04/30/2012 4:30 PM Tereso Newcomer, MD [facility] [phone] [facility]       Medication List     As of 04/15/2012 12:06 PM    STOP taking these medications         HYDROcodone-acetaminophen 7.5-500 MG/15ML solution   Commonly known as: LORTAB      ibuprofen 800 MG tablet   Commonly known as: ADVIL,MOTRIN      penicillin v potassium 500 MG tablet   Commonly known as: VEETID      TAKE these medications         albuterol 108 (90 BASE) MCG/ACT inhaler   Commonly known as: PROVENTIL HFA;VENTOLIN HFA   Inhale 2 puffs into the lungs every 6 (six) hours as needed. Shortness of breath and wheezing      albuterol (2.5 MG/3ML) 0.083% nebulizer solution   Commonly known as: PROVENTIL   Take 2.5 mg by nebulization every 6 (six) hours as needed. For shortness of breath      amLODipine 10 MG tablet   Commonly known as: NORVASC   Take 1 tablet (10 mg total) by  mouth daily.      azithromycin 250 MG tablet   Commonly known as: ZITHROMAX   For bronchitis      budesonide-formoterol 160-4.5 MCG/ACT inhaler   Commonly known as: SYMBICORT   Inhale 2 puffs into the lungs 2 (two) times daily.      MULTIVITAMIN GUMMIES ADULT PO   Take by mouth daily.      predniSONE 20 MG tablet   Commonly known as: DELTASONE   Take 3 tabs daily for 4 days, then take 2 tabs daily for 4 days, then take 1 tab daily for 4 days, then STOP      predniSONE 20 MG tablet   Commonly known as: DELTASONE   Take 40 mg daily for 3 days , then 30 mg daily for next 3 days, then 20 mg daily for next 3 days , then 10 mg daily for next 3 days then stop           Follow-up Information    Please follow up. (patient trying  to assign a PCP in the community)           The results of significant diagnostics from this hospitalization (including imaging, microbiology, ancillary and laboratory) are listed below for reference.    Significant Diagnostic Studies: Dg Neck Soft Tissue  04/10/2012  *RADIOLOGY REPORT*  Clinical Data: Cough and shortness of breath.  Tightness in the neck.  NECK SOFT TISSUES - 1+ VIEW  Comparison: CT head 03/15/2012  Findings: The hypopharynx and cervical airway appear patent.  No prevertebral or submental soft tissue swelling.  The epiglottis is compressed and therefore partially obscured but there is no evidence of significant thickening of the epiglottis or aryepiglottic folds.  Degenerative changes in the cervical spine. No radiopaque foreign bodies.  IMPRESSION: No evidence of hypopharyngeal obstruction or inflammation.   Original Report Authenticated By: Marlon Pel, M.D.    Dg Chest 2 View  04/10/2012  *RADIOLOGY REPORT*  Clinical Data: Asthma.  Cough and congestion.  CHEST - 2 VIEW  Comparison: 03/12/2012.  Findings: The heart size and pulmonary vascularity are normal. The lungs appear clear and expanded without focal air space disease or consolidation. No blunting of the costophrenic angles.  No pneumothorax.  Mediastinal contours appear intact. No significant change since previous study.  IMPRESSION: No evidence of active pulmonary disease.   Original Report Authenticated By: Marlon Pel, M.D.     Microbiology: No results found for this or any previous visit (from the past 240 hour(s)).   Labs: Basic Metabolic Panel:  Lab 04/11/12 1610 04/10/12 0910  NA 136 138  K 4.0 2.8*  CL 104 104  CO2 23 23  GLUCOSE 162* 173*  BUN 6 10  CREATININE 0.48* 0.54  CALCIUM 9.1 8.2*  MG -- 1.6  PHOS -- --   Liver Function Tests: No results found for this basename: AST:5,ALT:5,ALKPHOS:5,BILITOT:5,PROT:5,ALBUMIN:5 in the last 168 hours No results found for this basename:  LIPASE:5,AMYLASE:5 in the last 168 hours No results found for this basename: AMMONIA:5 in the last 168 hours CBC:  Lab 04/10/12 0910  WBC 5.3  NEUTROABS 4.5  HGB 11.4*  HCT 33.9*  MCV 86.5  PLT 251   Cardiac Enzymes: No results found for this basename: CKTOTAL:5,CKMB:5,CKMBINDEX:5,TROPONINI:5 in the last 168 hours BNP: BNP (last 3 results) No results found for this basename: PROBNP:3 in the last 8760 hours CBG:  Lab 04/10/12 1711  GLUCAP 89    Time coordinating discharge: 40 minutes  Signed:  Jasn Xia  [facility] 04/15/2012, 12:06 PM

## 2012-04-16 NOTE — ED Provider Notes (Signed)
Medical screening examination/treatment/procedure(s) were performed by non-physician practitioner and as supervising physician I was immediately available for consultation/collaboration.   Veer Elamin M Rosamond Andress, DO 04/16/12 1252 

## 2012-04-30 ENCOUNTER — Ambulatory Visit: Payer: Self-pay | Admitting: Obstetrics & Gynecology

## 2012-05-15 ENCOUNTER — Ambulatory Visit: Payer: Self-pay | Admitting: Obstetrics & Gynecology

## 2012-06-16 ENCOUNTER — Ambulatory Visit: Payer: Self-pay | Admitting: Family Medicine

## 2012-07-16 ENCOUNTER — Encounter (HOSPITAL_COMMUNITY): Payer: Self-pay | Admitting: Emergency Medicine

## 2012-07-16 ENCOUNTER — Emergency Department (HOSPITAL_COMMUNITY)
Admission: EM | Admit: 2012-07-16 | Discharge: 2012-07-16 | Disposition: A | Discharge status: Home / Self Care | Payer: Self-pay | Attending: Emergency Medicine | Admitting: Emergency Medicine

## 2012-07-16 DIAGNOSIS — K0889 Other specified disorders of teeth and supporting structures: Secondary | ICD-10-CM

## 2012-07-16 DIAGNOSIS — J45909 Unspecified asthma, uncomplicated: Secondary | ICD-10-CM | POA: Insufficient documentation

## 2012-07-16 DIAGNOSIS — R1084 Generalized abdominal pain: Secondary | ICD-10-CM | POA: Insufficient documentation

## 2012-07-16 DIAGNOSIS — K089 Disorder of teeth and supporting structures, unspecified: Secondary | ICD-10-CM | POA: Insufficient documentation

## 2012-07-16 DIAGNOSIS — Z87891 Personal history of nicotine dependence: Secondary | ICD-10-CM | POA: Insufficient documentation

## 2012-07-16 DIAGNOSIS — R112 Nausea with vomiting, unspecified: Secondary | ICD-10-CM | POA: Insufficient documentation

## 2012-07-16 DIAGNOSIS — R197 Diarrhea, unspecified: Secondary | ICD-10-CM | POA: Insufficient documentation

## 2012-07-16 DIAGNOSIS — Z9851 Tubal ligation status: Secondary | ICD-10-CM | POA: Insufficient documentation

## 2012-07-16 DIAGNOSIS — Z791 Long term (current) use of non-steroidal anti-inflammatories (NSAID): Secondary | ICD-10-CM | POA: Insufficient documentation

## 2012-07-16 DIAGNOSIS — Z3202 Encounter for pregnancy test, result negative: Secondary | ICD-10-CM | POA: Insufficient documentation

## 2012-07-16 DIAGNOSIS — M129 Arthropathy, unspecified: Secondary | ICD-10-CM | POA: Insufficient documentation

## 2012-07-16 DIAGNOSIS — IMO0002 Reserved for concepts with insufficient information to code with codable children: Secondary | ICD-10-CM | POA: Insufficient documentation

## 2012-07-16 DIAGNOSIS — Z9089 Acquired absence of other organs: Secondary | ICD-10-CM | POA: Insufficient documentation

## 2012-07-16 LAB — URINALYSIS, ROUTINE W REFLEX MICROSCOPIC
Bilirubin Urine: NEGATIVE
Ketones, ur: NEGATIVE mg/dL
Nitrite: NEGATIVE
Urobilinogen, UA: 1 mg/dL (ref 0.0–1.0)

## 2012-07-16 LAB — PREGNANCY, URINE: Preg Test, Ur: NEGATIVE

## 2012-07-16 MED ORDER — SODIUM CHLORIDE 0.9 % IV SOLN: 1000.0000 mL | INTRAVENOUS | Status: DC

## 2012-07-16 MED ORDER — ONDANSETRON HCL 4 MG/2ML IJ SOLN
4.0000 mg | Freq: Once | INTRAMUSCULAR | Status: AC
  Administered 2012-07-16: 4 mg via INTRAVENOUS
  Filled 2012-07-16: qty 2

## 2012-07-16 MED ORDER — SODIUM CHLORIDE 0.9 % IV SOLN
1000.0000 mL | Freq: Once | INTRAVENOUS | Status: AC
  Administered 2012-07-16: 1000 mL via INTRAVENOUS

## 2012-07-16 MED ORDER — ONDANSETRON 8 MG PO TBDP: 8.0000 mg | ORAL_TABLET | Freq: Three times a day (TID) | ORAL | Status: DC | PRN

## 2012-07-16 MED ORDER — PENICILLIN V POTASSIUM 500 MG PO TABS: 500.0000 mg | ORAL_TABLET | Freq: Four times a day (QID) | ORAL | Status: AC

## 2012-07-16 MED ORDER — HYDROCODONE-ACETAMINOPHEN 5-325 MG PO TABS: 1.0000 | ORAL_TABLET | ORAL | Status: DC | PRN

## 2012-07-16 MED ORDER — MORPHINE SULFATE 4 MG/ML IJ SOLN
6.0000 mg | Freq: Once | INTRAMUSCULAR | Status: AC
  Administered 2012-07-16: 6 mg via INTRAVENOUS
  Filled 2012-07-16: qty 2

## 2012-07-16 NOTE — Progress Notes (Signed)
Pt confirms she has not obtained a new pcp since Cm spoke with her 04/10/12 ED visit CM encouraged her again to find a pcp

## 2012-07-16 NOTE — Progress Notes (Signed)
Pt listed with no insurance coverage CM and [facility] liaison spoke with pt.  Pt offered services to assist with finding a [place] self pay provider & health reform information 

## 2012-07-16 NOTE — ED Provider Notes (Signed)
History     CSN: [id number]  Arrival date & time 07/16/12  6578   First MD Initiated Contact with Patient 07/16/12 1028      Chief Complaint  Patient presents with  . Abdominal Pain  . Nausea  . Emesis  . Diarrhea     The history is provided by the patient.   patient reports nausea vomiting and diarrhea x2 days.  No hematemesis melena or hematochezia.  She's had decreased oral intake.  She also reports several days of left upper first molar pain.  She has not seen her dentist.  She denies urinary symptoms.  She states there is no way she could be pregnant.  She has no localized abdominal pain only generalized abdominal cramping.  No headache or syncope.  No chest pain shortness breath.  Her symptoms are mild in severity.  Nothing worsens or improves her symptoms  Past Medical History  Diagnosis Date  . Asthma   . Seasonal allergies   . Arthritis     Past Surgical History  Procedure Date  . Cholecystectomy   . Cesarean section   . Tubal ligation     Family History  Problem Relation Age of Onset  . Hypertension Mother   . Diabetes Mother   . Cancer Mother     History  Substance Use Topics  . Smoking status: Former Games developer  . Smokeless tobacco: Never Used  . Alcohol Use: No    OB History    Grav Para Term Preterm Abortions TAB SAB Ect Mult Living   5 4 4  0 1 0 1 0 0 3      Review of Systems  Gastrointestinal: Positive for vomiting, abdominal pain and diarrhea.  All other systems reviewed and are negative.    Allergies  Review of patient's allergies indicates no known allergies.  Home Medications   Current Outpatient Rx  Name  Route  Sig  Dispense  Refill  . ALBUTEROL SULFATE HFA 108 (90 BASE) MCG/ACT IN AERS   Inhalation   Inhale 2 puffs into the lungs every 6 (six) hours as needed. Shortness of breath and wheezing         . ALBUTEROL SULFATE (2.5 MG/3ML) 0.083% IN NEBU   Nebulization   Take 2.5 mg by nebulization every 6 (six) hours as needed.  For shortness of breath         . IBUPROFEN 200 MG PO TABS   Oral   Take 200 mg by mouth every 6 (six) hours as needed. pain         . HYDROCODONE-ACETAMINOPHEN 5-325 MG PO TABS   Oral   Take 1 tablet by mouth every 4 (four) hours as needed for pain.   15 tablet   0   . ONDANSETRON 8 MG PO TBDP   Oral   Take 1 tablet (8 mg total) by mouth every 8 (eight) hours as needed for nausea.   12 tablet   0   . PENICILLIN V POTASSIUM 500 MG PO TABS   Oral   Take 1 tablet (500 mg total) by mouth 4 (four) times daily.   40 tablet   0     BP 148/92  Pulse 60  Temp 98.3 F (36.8 C) (Oral)  Resp 16  SpO2 100%  LMP 07/06/2012  Physical Exam  Nursing note and vitals reviewed. Constitutional: She is oriented to person, place, and time. She appears well-developed and well-nourished. No distress.  HENT:  Head: Normocephalic and atraumatic.  Dental decay mild tenderness of her left upper first molar.  No gingival swelling or fluctuance.  Eyes: EOM are normal.  Neck: Normal range of motion.  Cardiovascular: Normal rate, regular rhythm and normal heart sounds.   Pulmonary/Chest: Effort normal and breath sounds normal.  Abdominal: Soft. She exhibits no distension. There is no tenderness.  Musculoskeletal: Normal range of motion.  Neurological: She is alert and oriented to person, place, and time.  Skin: Skin is warm and dry.  Psychiatric: She has a normal mood and affect. Judgment normal.    ED Course  Procedures (including critical care time)  Labs Reviewed  URINALYSIS, ROUTINE W REFLEX MICROSCOPIC - Abnormal; Notable for the following:    Specific Gravity, Urine 1.035 (*)     All other components within normal limits  PREGNANCY, URINE   No results found.   1. Nausea & vomiting   2. Pain, dental       MDM  2:00 PM The patient is feeling much better at this time.  She's keeping oral fluids down.  Her abdominal exam is benign.  Home with dental followup and  penicillin for her dental pain.  Home with antinausea medicine for her vomiting which is likely viral in nature.        Lyanne Co, MD 07/16/12 [phone]

## 2012-07-16 NOTE — ED Notes (Signed)
Pt states that she has been having abd pain, NVD x 2 days.  Also c/o tooth pain on upper left side.  Has thrown up 6 times today.

## 2012-07-16 NOTE — ED Notes (Signed)
Pt given saltines and ginger ale. Pt tolerating ok.

## 2012-07-25 ENCOUNTER — Emergency Department (HOSPITAL_COMMUNITY)
Admission: EM | Admit: 2012-07-25 | Discharge: 2012-07-25 | Disposition: A | Discharge status: Home / Self Care | Payer: Self-pay | Attending: Emergency Medicine | Admitting: Emergency Medicine

## 2012-07-25 ENCOUNTER — Encounter (HOSPITAL_COMMUNITY): Payer: Self-pay | Admitting: *Deleted

## 2012-07-25 DIAGNOSIS — J00 Acute nasopharyngitis [common cold]: Secondary | ICD-10-CM | POA: Insufficient documentation

## 2012-07-25 DIAGNOSIS — R599 Enlarged lymph nodes, unspecified: Secondary | ICD-10-CM | POA: Insufficient documentation

## 2012-07-25 DIAGNOSIS — H9209 Otalgia, unspecified ear: Secondary | ICD-10-CM | POA: Insufficient documentation

## 2012-07-25 DIAGNOSIS — J3489 Other specified disorders of nose and nasal sinuses: Secondary | ICD-10-CM | POA: Insufficient documentation

## 2012-07-25 DIAGNOSIS — Z8739 Personal history of other diseases of the musculoskeletal system and connective tissue: Secondary | ICD-10-CM | POA: Insufficient documentation

## 2012-07-25 DIAGNOSIS — R05 Cough: Secondary | ICD-10-CM | POA: Insufficient documentation

## 2012-07-25 DIAGNOSIS — R51 Headache: Secondary | ICD-10-CM | POA: Insufficient documentation

## 2012-07-25 DIAGNOSIS — R0982 Postnasal drip: Secondary | ICD-10-CM | POA: Insufficient documentation

## 2012-07-25 DIAGNOSIS — R059 Cough, unspecified: Secondary | ICD-10-CM | POA: Insufficient documentation

## 2012-07-25 DIAGNOSIS — Z79899 Other long term (current) drug therapy: Secondary | ICD-10-CM | POA: Insufficient documentation

## 2012-07-25 DIAGNOSIS — Z87891 Personal history of nicotine dependence: Secondary | ICD-10-CM | POA: Insufficient documentation

## 2012-07-25 DIAGNOSIS — J45901 Unspecified asthma with (acute) exacerbation: Secondary | ICD-10-CM

## 2012-07-25 DIAGNOSIS — Z9851 Tubal ligation status: Secondary | ICD-10-CM | POA: Insufficient documentation

## 2012-07-25 DIAGNOSIS — J069 Acute upper respiratory infection, unspecified: Secondary | ICD-10-CM

## 2012-07-25 DIAGNOSIS — R509 Fever, unspecified: Secondary | ICD-10-CM | POA: Insufficient documentation

## 2012-07-25 MED ORDER — ALBUTEROL (5 MG/ML) CONTINUOUS INHALATION SOLN
10.0000 mg/h | INHALATION_SOLUTION | RESPIRATORY_TRACT | Status: DC
  Administered 2012-07-25: 10 mg/h via RESPIRATORY_TRACT
  Filled 2012-07-25: qty 20

## 2012-07-25 MED ORDER — ALBUTEROL SULFATE (5 MG/ML) 0.5% IN NEBU
INHALATION_SOLUTION | RESPIRATORY_TRACT | Status: AC
  Filled 2012-07-25: qty 1

## 2012-07-25 MED ORDER — SODIUM CHLORIDE 0.9 % IV BOLUS (SEPSIS)
1000.0000 mL | Freq: Once | INTRAVENOUS | Status: AC
  Administered 2012-07-25: 1000 mL via INTRAVENOUS

## 2012-07-25 MED ORDER — IPRATROPIUM BROMIDE 0.02 % IN SOLN
0.5000 mg | Freq: Once | RESPIRATORY_TRACT | Status: AC
Start: 2012-07-25 — End: 2012-07-25
  Administered 2012-07-25: 0.5 mg via RESPIRATORY_TRACT

## 2012-07-25 MED ORDER — ONDANSETRON HCL 4 MG/2ML IJ SOLN
4.0000 mg | Freq: Once | INTRAMUSCULAR | Status: AC
  Administered 2012-07-25: 4 mg via INTRAVENOUS
  Filled 2012-07-25: qty 2

## 2012-07-25 MED ORDER — ALBUTEROL SULFATE (5 MG/ML) 0.5% IN NEBU
5.0000 mg | INHALATION_SOLUTION | Freq: Once | RESPIRATORY_TRACT | Status: AC
  Administered 2012-07-25: 5 mg via RESPIRATORY_TRACT

## 2012-07-25 MED ORDER — IPRATROPIUM BROMIDE 0.02 % IN SOLN: 0.5000 mg | Freq: Once | RESPIRATORY_TRACT | Status: DC

## 2012-07-25 MED ORDER — METHYLPREDNISOLONE SODIUM SUCC 125 MG IJ SOLR
125.0000 mg | Freq: Once | INTRAMUSCULAR | Status: AC
  Administered 2012-07-25: 125 mg via INTRAVENOUS
  Filled 2012-07-25: qty 2

## 2012-07-25 MED ORDER — BECLOMETHASONE DIPROPIONATE 80 MCG/ACT IN AERS: 1.0000 | INHALATION_SPRAY | Freq: Two times a day (BID) | RESPIRATORY_TRACT | Status: DC

## 2012-07-25 MED ORDER — PREDNISONE 20 MG PO TABS: ORAL_TABLET | ORAL | Status: DC

## 2012-07-25 MED ORDER — IPRATROPIUM BROMIDE 0.02 % IN SOLN
RESPIRATORY_TRACT | Status: AC
  Filled 2012-07-25: qty 2.5

## 2012-07-25 MED ORDER — ALBUTEROL SULFATE (5 MG/ML) 0.5% IN NEBU: 5.0000 mg | INHALATION_SOLUTION | Freq: Once | RESPIRATORY_TRACT | Status: DC

## 2012-07-25 NOTE — ED Provider Notes (Signed)
Medical screening examination/treatment/procedure(s) were conducted as a shared visit with non-physician practitioner(s) and myself.  I personally evaluated the patient during the encounter  Pt with h/o asthma, possible fever and URI symptoms at home.  Came by EMS, neb treatments given.  Wheezing on arrival, continued nebs here.  Had 1 episode of N/V.  abd is soft, no guard or rebound.  Sats on supplemental O2 is 100%.     11:09 AM Pt clinically improved, nausea improved, will be discharged to home.    Wendy James. Zohar Maroney, MD 07/25/12 1109

## 2012-07-25 NOTE — ED Provider Notes (Signed)
History     CSN: [id number]  Arrival date & time 07/25/12  1191   First MD Initiated Contact with Patient 07/25/12 [phone]      Chief Complaint  Patient presents with  . Asthma    (Consider location/radiation/quality/duration/timing/severity/associated sxs/prior treatment) HPI SUBJECTIVE:  Wendy James is a 41 y.o. female seen urgently with exacerbation of asthma for 2 days. Wheezing is described as moderate to severe. Associated symptoms:coryza, congestion, sore throat, swollen glands, post nasal drip, productive cough, headache, bilateral sinus pain, fever  Left sided otalgia and clear nasal discharge. Current asthma medications: albuterol nebs, MDI. Patient denies smoke cigarettes. She has a history of hospitalization for her asthma. No intubations.  Patient's upper respiratory symptoms have been present since 2 days ago.  She has had increased asthma symptoms difficulty breathing especially at night.  She's been using her nebulizer at home without relief.  Patient's primary care provider is at the [facility].  Patient has had fevers at home up to 103F orally.  Last home temperature was 99.1.   Denieschest pain or pressure, radiation to left arm, jaw or back, or diaphoresis. Denies dysuria, flank pain, suprapubic pain, frequency, urgency, or hematuria. Denieslight headedness, weakness, visual disturbances. Denies abdominal pain, nausea, vomiting, diarrhea or constipation.   Past Medical History  Diagnosis Date  . Asthma   . Seasonal allergies   . Arthritis     Past Surgical History  Procedure Date  . Cholecystectomy   . Cesarean section   . Tubal ligation     Family History  Problem Relation Age of Onset  . Hypertension Mother   . Diabetes Mother   . Cancer Mother     History  Substance Use Topics  . Smoking status: Former Games developer  . Smokeless tobacco: Never Used  . Alcohol Use: No    OB History    Grav Para Term Preterm Abortions TAB SAB Ect Mult Living   5 4 4  0 1 0 1 0 0 3      Review of Systems Ten systems reviewed and are negative for acute change, except as noted in the HPI.   Allergies  Review of patient's allergies indicates no known allergies.  Home Medications   Current Outpatient Rx  Name  Route  Sig  Dispense  Refill  . ALBUTEROL SULFATE HFA 108 (90 BASE) MCG/ACT IN AERS   Inhalation   Inhale 2 puffs into the lungs every 6 (six) hours as needed. For shortness of breath and wheezing         . ALBUTEROL SULFATE (2.5 MG/3ML) 0.083% IN NEBU   Nebulization   Take 2.5 mg by nebulization every 6 (six) hours as needed. For shortness of breath         . IBUPROFEN 200 MG PO TABS   Oral   Take 400 mg by mouth every 6 (six) hours as needed. For pain           BP 154/89  Pulse 86  Temp 98.2 F (36.8 C) (Oral)  Resp 22  SpO2 100%  LMP 07/06/2012  Physical Exam  Nursing note and vitals reviewed. Constitutional: She is oriented to person, place, and time. She appears well-developed and well-nourished. No distress.  HENT:  Head: Normocephalic and atraumatic.       Exudate present on the right tonsil.  Pharynx is erythematous.  Mucous is  noted noted on the posterior pharynx.  Uvula is midline.  Airway is patent no trismus.  Eyes: Conjunctivae  normal are normal. Right eye exhibits no discharge. Left eye exhibits no discharge. No scleral icterus.  Neck: Normal range of motion.  Cardiovascular: Normal rate, regular rhythm and normal heart sounds.  Exam reveals no gallop and no friction rub.   No murmur heard. Pulmonary/Chest: She has wheezes.       Patient with increased respiratory effort and prolonged expiratory phase.  She has diffuse wheezes throughout all lung fields.  Patient has received one Duo neb nebulizer treatment. Rhonchi  with cough  Abdominal: Soft. Bowel sounds are normal. She exhibits no distension and no mass. There is no tenderness. There is no guarding.  Musculoskeletal: Normal range of motion.    Lymphadenopathy:    She has cervical adenopathy.  Neurological: She is alert and oriented to person, place, and time.  Skin: Skin is warm and dry. She is not diaphoretic.  Psychiatric: She has a normal mood and affect. Her behavior is normal.    ED Course  Procedures (including critical care time)   Labs Reviewed  RAPID STREP SCREEN   No results found.   No diagnosis found.    MDM  9:21 AM BP 154/89  Pulse 86  Temp 98.2 F (36.8 C) (Oral)  Resp 22  SpO2 100%  LMP 07/06/2012 Patient with likely viral upper respiratory infection.  Her left TM is erythematous and she complains of pain in the ear.  Do not see any air-fluid levels or pus behind the membrane.  The brain is nonbulging.  And landmarks are clear.  Patient will receive continuous albuterol treatment for one hour.  Also getting IV Solu-Medrol.   10:56 AM Patient patient is ventilating much better.  There are no wheezes on auscultation.  Going to discharge patient home with a short course of prednisone. She is requesting QVAR which she has been unable to afford, but now has insurance.  She will f/u with PCP.    Arthor Captain, PA-C 07/25/12 1102

## 2012-07-25 NOTE — ED Notes (Signed)
Pt reports asthma flare up.  Reports having a fever at home.  Pt noted to have audible wheezing.  Talking in complete sentences.

## 2012-10-13 ENCOUNTER — Encounter (HOSPITAL_COMMUNITY): Payer: Self-pay | Admitting: Emergency Medicine

## 2012-10-13 ENCOUNTER — Emergency Department (HOSPITAL_COMMUNITY): Payer: Self-pay

## 2012-10-13 ENCOUNTER — Emergency Department (HOSPITAL_COMMUNITY)
Admission: EM | Admit: 2012-10-13 | Discharge: 2012-10-13 | Disposition: A | Discharge status: Home / Self Care | Payer: Self-pay | Attending: Emergency Medicine | Admitting: Emergency Medicine

## 2012-10-13 DIAGNOSIS — J45901 Unspecified asthma with (acute) exacerbation: Secondary | ICD-10-CM | POA: Insufficient documentation

## 2012-10-13 DIAGNOSIS — Z79899 Other long term (current) drug therapy: Secondary | ICD-10-CM | POA: Insufficient documentation

## 2012-10-13 DIAGNOSIS — R112 Nausea with vomiting, unspecified: Secondary | ICD-10-CM | POA: Insufficient documentation

## 2012-10-13 DIAGNOSIS — R05 Cough: Secondary | ICD-10-CM | POA: Insufficient documentation

## 2012-10-13 DIAGNOSIS — Z8739 Personal history of other diseases of the musculoskeletal system and connective tissue: Secondary | ICD-10-CM | POA: Insufficient documentation

## 2012-10-13 DIAGNOSIS — IMO0002 Reserved for concepts with insufficient information to code with codable children: Secondary | ICD-10-CM | POA: Insufficient documentation

## 2012-10-13 DIAGNOSIS — R0789 Other chest pain: Secondary | ICD-10-CM | POA: Insufficient documentation

## 2012-10-13 DIAGNOSIS — R059 Cough, unspecified: Secondary | ICD-10-CM | POA: Insufficient documentation

## 2012-10-13 DIAGNOSIS — Z87891 Personal history of nicotine dependence: Secondary | ICD-10-CM | POA: Insufficient documentation

## 2012-10-13 DIAGNOSIS — R6883 Chills (without fever): Secondary | ICD-10-CM | POA: Insufficient documentation

## 2012-10-13 LAB — CBC WITH DIFFERENTIAL/PLATELET
Basophils Absolute: 0 10*3/uL (ref 0.0–0.1)
Basophils Relative: 0 % (ref 0–1)
Eosinophils Absolute: 0.4 10*3/uL (ref 0.0–0.7)
Eosinophils Relative: 8 % — ABNORMAL HIGH (ref 0–5)
HCT: 35.4 % — ABNORMAL LOW (ref 36.0–46.0)
MCH: 28.1 pg (ref 26.0–34.0)
MCHC: 32.8 g/dL (ref 30.0–36.0)
Monocytes Absolute: 0.5 10*3/uL (ref 0.1–1.0)
Monocytes Relative: 11 % (ref 3–12)
Neutro Abs: 1.6 10*3/uL — ABNORMAL LOW (ref 1.7–7.7)
RDW: 14.7 % (ref 11.5–15.5)

## 2012-10-13 LAB — BASIC METABOLIC PANEL
BUN: 10 mg/dL (ref 6–23)
Chloride: 101 mEq/L (ref 96–112)
Creatinine, Ser: 0.62 mg/dL (ref 0.50–1.10)
GFR calc Af Amer: >90 mL/min (ref >90)
Glucose, Bld: 97 mg/dL (ref 70–99)

## 2012-10-13 MED ORDER — IPRATROPIUM BROMIDE 0.02 % IN SOLN
0.5000 mg | Freq: Four times a day (QID) | RESPIRATORY_TRACT | Status: DC
  Administered 2012-10-13: 0.5 mg via RESPIRATORY_TRACT

## 2012-10-13 MED ORDER — CETIRIZINE HCL 10 MG PO TABS: 10.0000 mg | ORAL_TABLET | Freq: Every day | ORAL | Status: DC

## 2012-10-13 MED ORDER — IPRATROPIUM BROMIDE 0.02 % IN SOLN: 0.5000 mg | Freq: Once | RESPIRATORY_TRACT | Status: DC

## 2012-10-13 MED ORDER — ALBUTEROL SULFATE (5 MG/ML) 0.5% IN NEBU
10.0000 mg | INHALATION_SOLUTION | Freq: Once | RESPIRATORY_TRACT | Status: AC
  Administered 2012-10-13: 10 mg via RESPIRATORY_TRACT

## 2012-10-13 MED ORDER — IPRATROPIUM BROMIDE 0.02 % IN SOLN
1.0000 mg | Freq: Once | RESPIRATORY_TRACT | Status: AC
  Administered 2012-10-13: 1 mg via RESPIRATORY_TRACT

## 2012-10-13 MED ORDER — ALBUTEROL SULFATE HFA 108 (90 BASE) MCG/ACT IN AERS: 2.0000 | INHALATION_SPRAY | Freq: Four times a day (QID) | RESPIRATORY_TRACT | Status: DC | PRN

## 2012-10-13 MED ORDER — ALBUTEROL SULFATE (5 MG/ML) 0.5% IN NEBU
5.0000 mg | INHALATION_SOLUTION | Freq: Once | RESPIRATORY_TRACT | Status: AC
  Administered 2012-10-13: 5 mg via RESPIRATORY_TRACT

## 2012-10-13 MED ORDER — PREDNISONE 20 MG PO TABS: ORAL_TABLET | ORAL | Status: DC

## 2012-10-13 MED ORDER — METHYLPREDNISOLONE SODIUM SUCC 125 MG IJ SOLR
125.0000 mg | Freq: Once | INTRAMUSCULAR | Status: AC
  Administered 2012-10-13: 125 mg via INTRAVENOUS

## 2012-10-13 MED ORDER — ALBUTEROL SULFATE (5 MG/ML) 0.5% IN NEBU: 2.5000 mg | INHALATION_SOLUTION | Freq: Once | RESPIRATORY_TRACT | Status: DC

## 2012-10-13 MED ORDER — BUDESONIDE 180 MCG/ACT IN AEPB: 1.0000 | INHALATION_SPRAY | Freq: Two times a day (BID) | RESPIRATORY_TRACT | Status: DC

## 2012-10-13 NOTE — ED Notes (Signed)
Ambulated pt in hallway.  O2 sats 95-100% on RA with heart rate between 100-110.  Pt had a couple of bouts of coughing during ambulation which correlated to heart rate increase to 110.

## 2012-10-13 NOTE — ED Notes (Signed)
Xray bedside.

## 2012-10-13 NOTE — ED Provider Notes (Signed)
History     CSN: [id number]  Arrival date & time 10/13/12  4098   First MD Initiated Contact with Patient 10/13/12 [phone]      Chief Complaint  Patient presents with  . Shortness of Breath    (Consider location/radiation/quality/duration/timing/severity/associated sxs/prior treatment) HPI  Wendy James is a 42 y.o. female seen urgently with exacerbation of asthma for 2 days. Wheezing is described as moderate to severe, Inspiratory/expiratory with increased work of breathing and pain on the right side of the chest. Associated symptoms:productive cough, chills. Current asthma medications: Albuterol mdi/nebulaizer. Patient denies smoke cigarettes.  The patient is known to me from previous emergency visit.  She has a history of hospitalization for her asthma without intubation.  Patient was given Qvar at last discharge which she states helped her symptoms greatly.  Patient is uninsured and does not have followup.  She was also unable to obtain followup with suggested providers at last visit. The patient states that her asthma usually gets bad around early spring with seasonal allergies. Unable to control here sxs at home with neb treatments.   Past Medical History  Diagnosis Date  . Asthma   . Seasonal allergies   . Arthritis     Past Surgical History  Procedure Laterality Date  . Cholecystectomy    . Cesarean section    . Tubal ligation      Family History  Problem Relation Age of Onset  . Hypertension Mother   . Diabetes Mother   . Cancer Mother     History  Substance Use Topics  . Smoking status: Former Games developer  . Smokeless tobacco: Never Used  . Alcohol Use: No    OB History   Grav Para Term Preterm Abortions TAB SAB Ect Mult Living   5 4 4  0 1 0 1 0 0 3      Review of Systems  Constitutional: Positive for chills. Negative for fever.  HENT: Negative for trouble swallowing.   Respiratory: Positive for cough, chest tightness, shortness of breath and wheezing.    Cardiovascular: Positive for chest pain (with breathing on right lower antereior thorax.).  Gastrointestinal: Positive for nausea and vomiting. Negative for abdominal pain, diarrhea and constipation.       + nausea and 1 episode of post tussive vomiting which she states is normal with her exacerbations.  Genitourinary: Negative for dysuria and hematuria.  Musculoskeletal: Negative for myalgias and arthralgias.  Skin: Negative for rash.  Neurological: Negative for numbness.  All other systems reviewed and are negative.    Allergies  Review of patient's allergies indicates no known allergies.  Home Medications   Current Outpatient Rx  Name  Route  Sig  Dispense  Refill  . albuterol (PROVENTIL HFA;VENTOLIN HFA) 108 (90 BASE) MCG/ACT inhaler   Inhalation   Inhale 2 puffs into the lungs every 6 (six) hours as needed. For shortness of breath and wheezing         . albuterol (PROVENTIL) (2.5 MG/3ML) 0.083% nebulizer solution   Nebulization   Take 2.5 mg by nebulization every 6 (six) hours as needed. For shortness of breath         . beclomethasone (QVAR) 80 MCG/ACT inhaler   Inhalation   Inhale 1 puff into the lungs 2 (two) times daily.   1 Inhaler   12   . ibuprofen (ADVIL,MOTRIN) 200 MG tablet   Oral   Take 400 mg by mouth every 6 (six) hours as needed. For pain         .  predniSONE (DELTASONE) 20 MG tablet      3 tabs po day one, then 2 po daily x 4 days   11 tablet   0     BP 133/74  Pulse 84  Resp 18  Ht 5\' 2"  (1.575 m)  Wt 190 lb (86.183 kg)  BMI 34.74 kg/m2  SpO2 96%  LMP 09/05/2012  Physical Exam  Nursing note and vitals reviewed. Constitutional: She is oriented to person, place, and time. She appears well-developed and well-nourished.  Patient appears uncomfortable.  Visibly working hard to breathe.  Audible insp/expritory wheezes upon entering the room.  HENT:  Head: Normocephalic and atraumatic.  Eyes: Conjunctivae are normal. No scleral  icterus.  Neck: Normal range of motion.  Cardiovascular: Normal rate, regular rhythm and normal heart sounds.  Exam reveals no gallop and no friction rub.   No murmur heard. Pulmonary/Chest: Effort normal. No respiratory distress. She has wheezes.  Patient using accessory muscles. She has already completed 5/5 alb/atro neb.  +productive cough.  Abdominal: Soft. Bowel sounds are normal. She exhibits no distension and no mass. There is no tenderness. There is no guarding.  Neurological: She is alert and oriented to person, place, and time.  Skin: Skin is warm and dry.    ED Course  Procedures (including critical care time)  Labs Reviewed  BASIC METABOLIC PANEL  CBC WITH DIFFERENTIAL   No results found.   1. Asthma with acute exacerbation       MDM  6:45 AM Patient with asthma exacerbation.  CAP/ viral URI/ allergies included in differential.  Xray pending.  Will obtain basic labs in case patient needs admission.  8:51 AM Filed Vitals:   10/13/12 0630 10/13/12 0636 10/13/12 0641 10/13/12 0730  BP: 141/91   135/82  Pulse: 73 74    Resp: 13 17  14   Height:      Weight:      SpO2: 96% 97% 100% 96%   Patient sxs have greatly improved.  She was able to ambulate and 02 sats above 95% on room air.  I will d/c with inhaled steriod and taper+ albuterol MDI refill.  Will add zyrtec.  Patient will be referred to [facility] hospitalist clinic. For further eval. Patient ambulated in ED with O2 saturations maintained >90, no current signs of respiratory distress. Lung exam improved after nebulizer treatment. . Pt states they are breathing at baseline. Pt has been instructed to continue using prescribed medications and to speak with PCP about today's exacerbation.        Arthor Captain, PA-C 10/13/12 [phone]

## 2012-10-13 NOTE — ED Notes (Signed)
Pt states that she had an asthma attack 2 days ago and began taking her home nebs but things have continued to get worse. Tonight at 0400 bega gasping for air.

## 2012-10-16 NOTE — ED Provider Notes (Signed)
Medical screening examination/treatment/procedure(s) were performed by non-physician practitioner and as supervising physician I was immediately available for consultation/collaboration.  Ethelda Chick, MD 10/16/12 0700

## 2012-10-20 NOTE — Progress Notes (Signed)
Called pt to ask pt if she still wants to have her Mirena inserted.  Pt stated that she could afford the insertion fee and that it was ok for [place] to put it back.  Pt wanted to schedule an appt for pap smear.  Due to the pt not having insurance I got pt # to schedule for a free pap smear.  Pt stated understanding and did not have any other questions.

## 2012-12-28 ENCOUNTER — Encounter (HOSPITAL_COMMUNITY): Payer: Self-pay | Admitting: *Deleted

## 2012-12-28 ENCOUNTER — Emergency Department (HOSPITAL_COMMUNITY)
Admission: EM | Admit: 2012-12-28 | Discharge: 2012-12-28 | Disposition: A | Discharge status: Home / Self Care | Payer: Self-pay | Attending: Emergency Medicine | Admitting: Emergency Medicine

## 2012-12-28 DIAGNOSIS — M79609 Pain in unspecified limb: Secondary | ICD-10-CM | POA: Insufficient documentation

## 2012-12-28 DIAGNOSIS — Y929 Unspecified place or not applicable: Secondary | ICD-10-CM | POA: Insufficient documentation

## 2012-12-28 DIAGNOSIS — Y939 Activity, unspecified: Secondary | ICD-10-CM | POA: Insufficient documentation

## 2012-12-28 DIAGNOSIS — Z87891 Personal history of nicotine dependence: Secondary | ICD-10-CM | POA: Insufficient documentation

## 2012-12-28 DIAGNOSIS — J45909 Unspecified asthma, uncomplicated: Secondary | ICD-10-CM | POA: Insufficient documentation

## 2012-12-28 DIAGNOSIS — M129 Arthropathy, unspecified: Secondary | ICD-10-CM | POA: Insufficient documentation

## 2012-12-28 DIAGNOSIS — S90821A Blister (nonthermal), right foot, initial encounter: Secondary | ICD-10-CM

## 2012-12-28 DIAGNOSIS — M109 Gout, unspecified: Secondary | ICD-10-CM | POA: Insufficient documentation

## 2012-12-28 DIAGNOSIS — M10061 Idiopathic gout, right knee: Secondary | ICD-10-CM

## 2012-12-28 DIAGNOSIS — W19XXXA Unspecified fall, initial encounter: Secondary | ICD-10-CM | POA: Insufficient documentation

## 2012-12-28 DIAGNOSIS — IMO0002 Reserved for concepts with insufficient information to code with codable children: Secondary | ICD-10-CM | POA: Insufficient documentation

## 2012-12-28 DIAGNOSIS — IMO0001 Reserved for inherently not codable concepts without codable children: Secondary | ICD-10-CM | POA: Insufficient documentation

## 2012-12-28 MED ORDER — COLCHICINE 0.6 MG PO TABS
1.2000 mg | ORAL_TABLET | Freq: Once | ORAL | Status: AC
  Administered 2012-12-28: 1.2 mg via ORAL
  Filled 2012-12-28: qty 2

## 2012-12-28 MED ORDER — NAPROXEN 500 MG PO TABS: 500.0000 mg | ORAL_TABLET | Freq: Two times a day (BID) | ORAL | Status: DC

## 2012-12-28 NOTE — ED Notes (Signed)
Pt states @ 2 weeks ago she noticed a blister on the bottom of her right foot.  The blister broke open and had a thick yellow exudate.  Pt then developed similar blisters with similar exudate to right lateral foot.  Pt now presents with a healing blister to bottom of right foot and right lateral foot.  Last night pt's middle toe on right foot "started oozing pus." Pt also states that her right heel sometimes burns.  Pt denies problems with left foot. Pt is not diabetic that she knows of.  Pt denies fever as well as N/V.  Pt has soaked foot in Epsom salts and poured Hydrogen Peroxide on open blisters.  Pt is ambulatory, but not without pain.  Pt has good pulse in extremity, redness noted to lateral and bottom of right foot.  No swelling or deformity noted.

## 2012-12-28 NOTE — ED Provider Notes (Signed)
History     CSN: [id number]  Arrival date & time 12/28/12  4098   First MD Initiated Contact with Patient 12/28/12 [phone]      Chief Complaint  Patient presents with  . Foot Pain    right    (Consider location/radiation/quality/duration/timing/severity/associated sxs/prior treatment) HPI Comments: 42 y.o. Female with PMHx of asthma presents today with pain to right foot and right knee. Denies fever, chills, trauma, nausea, vomiting.  Denies hx of diabetes, gout. Pt is ambulatory.  Right foot: This is an recurrent problem (July 2013). Pain to right foot began about 2 weeks ago when pt noticed blisters developing on the planter surface and along the lateral edge of the bottom of her foot. Some of the blisters have popped, causing moderate localized pain. Pt states blisters did ooze some yellow pus. Worse with weight bearing. Better with rest and elevation. Gradually worsening. Pt did put some OTC athlete foot cream.   Right knee: This is a new problem. Pt admits sudden onset swelling and pain to right knee since yesterday. Painful both with weight bearing and with rest. Pt describes pain as sharp, localized, and severe, tender to touch. Pt has taken no interventions.   Patient is a 42 y.o. female presenting with lower extremity pain.  Foot Pain This is a recurrent problem. Episode onset: 2 weeks ago. The problem has been gradually worsening. Associated symptoms include joint swelling and myalgias. Pertinent negatives include no chest pain, diaphoresis, fever, headaches, nausea, neck pain, numbness, vomiting or weakness. The symptoms are aggravated by walking. Treatments tried: athlete's foot cream. The treatment provided no relief.    Past Medical History  Diagnosis Date  . Asthma   . Seasonal allergies   . Arthritis     Past Surgical History  Procedure Laterality Date  . Cholecystectomy    . Cesarean section    . Tubal ligation      Family History  Problem Relation Age of Onset   . Hypertension Mother   . Diabetes Mother   . Cancer Mother     History  Substance Use Topics  . Smoking status: Former Games developer  . Smokeless tobacco: Never Used  . Alcohol Use: No    OB History   Grav Para Term Preterm Abortions TAB SAB Ect Mult Living   5 4 4  0 1 0 1 0 0 3      Review of Systems  Constitutional: Negative for fever and diaphoresis.  HENT: Negative for neck pain and neck stiffness.   Eyes: Negative for visual disturbance.  Respiratory: Negative for apnea, chest tightness and shortness of breath.   Cardiovascular: Negative for chest pain and palpitations.  Gastrointestinal: Negative for nausea, vomiting, diarrhea and constipation.  Genitourinary: Negative for dysuria.  Musculoskeletal: Positive for myalgias and joint swelling. Negative for gait problem.       Right knee swelling and pain  Skin:       Blisters and flaking to skin bottom and outside of right foot.   Neurological: Negative for dizziness, weakness, light-headedness, numbness and headaches.    Allergies  Review of patient's allergies indicates no known allergies.  Home Medications   Current Outpatient Rx  Name  Route  Sig  Dispense  Refill  . albuterol (PROVENTIL HFA;VENTOLIN HFA) 108 (90 BASE) MCG/ACT inhaler   Inhalation   Inhale 2 puffs into the lungs every 6 (six) hours as needed. For shortness of breath and wheezing         . albuterol (  PROVENTIL) (2.5 MG/3ML) 0.083% nebulizer solution   Nebulization   Take 2.5 mg by nebulization every 6 (six) hours as needed. For shortness of breath         . albuterol (VENTOLIN HFA) 108 (90 BASE) MCG/ACT inhaler   Inhalation   Inhale 2 puffs into the lungs every 6 (six) hours as needed for wheezing.   1 Inhaler   3   . budesonide (PULMICORT) 180 MCG/ACT inhaler   Inhalation   Inhale 1 puff into the lungs 2 (two) times daily.   1 Inhaler   3   . cetirizine (ZYRTEC ALLERGY) 10 MG tablet   Oral   Take 1 tablet (10 mg total) by mouth  daily.   30 tablet   1   . ibuprofen (ADVIL,MOTRIN) 200 MG tablet   Oral   Take 400 mg by mouth every 6 (six) hours as needed for pain.         . predniSONE (DELTASONE) 20 MG tablet      Take 3 pills in the morning for the first 4 days Take 2 pills in the morning for the next 4 days Take one pill in the morning for the last 4 days.   24 tablet   0   . traMADol (ULTRAM) 50 MG tablet   Oral   Take 50 mg by mouth every 6 (six) hours as needed for pain.           BP 149/96  Pulse 73  Temp(Src) 99.1 F (37.3 C) (Oral)  Resp 18  SpO2 98%  LMP 12/22/2012  Physical Exam  Nursing note and vitals reviewed. Constitutional: She is oriented to person, place, and time. She appears well-developed and well-nourished. No distress.  HENT:  Head: Normocephalic and atraumatic.  Eyes: EOM are normal. Pupils are equal, round, and reactive to light.  Neck: Normal range of motion. Neck supple.  No meningeal signs  Cardiovascular: Normal rate, regular rhythm and normal heart sounds.  Exam reveals no gallop and no friction rub.   No murmur heard. Pulmonary/Chest: Effort normal and breath sounds normal. No respiratory distress. She has no wheezes. She has no rales. She exhibits no tenderness.  Abdominal: Soft. Bowel sounds are normal. She exhibits no distension. There is no tenderness. There is no rebound and no guarding.  Musculoskeletal: Normal range of motion. She exhibits no edema and no tenderness.  5/5 strength throughout. Mild swelling. No erythema. No warmth. No effusion. Good quadricep strength on straight leg raise. No joint laxity.  Neurological: She is alert and oriented to person, place, and time. No cranial nerve deficit.  Skin: Skin is warm and dry. She is not diaphoretic. No erythema.  4cm x 2 cm popped blister on plantar surface of right foot. Multiple smaller blisters and dried skin contained to epidermis. No erythema. No warmth. No edema.    Psychiatric: She has a normal  mood and affect.    ED Course  Procedures (including critical care time)  Labs Reviewed - No data to display No results found.   1. Idiopathic gout of right knee   2. Friction blisters of sole of right foot       MDM  Considering HPI and PE, including lack of trauma, lack of hx of chronic conditions, no erythema, no warmth, no effusion, no deformity, no bony tenderness, no fever or impaired ROM, not concerned for acute bony injury or septic arthritis of right knee. Consider gout or some rheumatoid etiology given sudden onset  and pt fam hx of lupus. Pt case discussed with and seen by Dr. Hyacinth Meeker who agrees. Will give colchicine in ED and prescribe Naprosyn which appears to be on the $4 [employer] list.   Right foot appears to be friction blisters. No warmth, no erythema, skin breaks contained the epidermis and not concerning for osteomyelitis. Do not feel imaging or anti biotics are needed at this time. Dr. Hyacinth Meeker is in agreement.   At this time there does not appear to be any evidence of an acute emergency medical condition and the patient appears stable for discharge with appropriate outpatient follow up. Discussed with pt the importance of outpt follow up to further evaluate her symptoms. Diagnosis was discussed with patient who verbalizes understanding and is agreeable to discharge.   Glade Nurse, PA-C 12/28/12 1637

## 2012-12-29 NOTE — ED Provider Notes (Signed)
Medical screening examination/treatment/procedure(s) were performed by non-physician practitioner and as supervising physician I was immediately available for consultation/collaboration.    Amoria Mclees D Annora Guderian, MD 12/29/12 1056 

## 2013-01-01 ENCOUNTER — Inpatient Hospital Stay: Payer: Self-pay

## 2013-01-05 ENCOUNTER — Emergency Department (HOSPITAL_COMMUNITY)
Admission: EM | Admit: 2013-01-05 | Discharge: 2013-01-05 | Disposition: A | Discharge status: Home / Self Care | Payer: Self-pay | Attending: Emergency Medicine | Admitting: Emergency Medicine

## 2013-01-05 ENCOUNTER — Ambulatory Visit: Payer: Self-pay | Attending: Family Medicine | Admitting: Internal Medicine

## 2013-01-05 ENCOUNTER — Encounter (HOSPITAL_COMMUNITY): Payer: Self-pay | Admitting: Family Medicine

## 2013-01-05 ENCOUNTER — Encounter (HOSPITAL_COMMUNITY): Payer: Self-pay

## 2013-01-05 VITALS — BP 145/87 | HR 73 | Temp 99.1°F | Resp 18 | Wt 208.6 lb

## 2013-01-05 DIAGNOSIS — M79671 Pain in right foot: Secondary | ICD-10-CM

## 2013-01-05 DIAGNOSIS — M2550 Pain in unspecified joint: Secondary | ICD-10-CM | POA: Insufficient documentation

## 2013-01-05 DIAGNOSIS — M79609 Pain in unspecified limb: Secondary | ICD-10-CM

## 2013-01-05 DIAGNOSIS — IMO0001 Reserved for inherently not codable concepts without codable children: Secondary | ICD-10-CM | POA: Insufficient documentation

## 2013-01-05 DIAGNOSIS — Z8739 Personal history of other diseases of the musculoskeletal system and connective tissue: Secondary | ICD-10-CM | POA: Insufficient documentation

## 2013-01-05 DIAGNOSIS — J45909 Unspecified asthma, uncomplicated: Secondary | ICD-10-CM | POA: Insufficient documentation

## 2013-01-05 DIAGNOSIS — Z79899 Other long term (current) drug therapy: Secondary | ICD-10-CM | POA: Insufficient documentation

## 2013-01-05 DIAGNOSIS — Z87891 Personal history of nicotine dependence: Secondary | ICD-10-CM | POA: Insufficient documentation

## 2013-01-05 DIAGNOSIS — J309 Allergic rhinitis, unspecified: Secondary | ICD-10-CM | POA: Insufficient documentation

## 2013-01-05 LAB — COMPREHENSIVE METABOLIC PANEL
Albumin: 3.7 g/dL (ref 3.5–5.2)
BUN: 12 mg/dL (ref 6–23)
Calcium: 9.2 mg/dL (ref 8.4–10.5)
Chloride: 105 mEq/L (ref 96–112)
Creatinine, Ser: 0.7 mg/dL (ref 0.50–1.10)
Total Bilirubin: 0.5 mg/dL (ref 0.3–1.2)
Total Protein: 7.7 g/dL (ref 6.0–8.3)

## 2013-01-05 LAB — CBC WITH DIFFERENTIAL/PLATELET
Basophils Relative: 1 % (ref 0–1)
Eosinophils Absolute: 0.3 10*3/uL (ref 0.0–0.7)
Eosinophils Relative: 5 % (ref 0–5)
HCT: 33.6 % — ABNORMAL LOW (ref 36.0–46.0)
Hemoglobin: 11 g/dL — ABNORMAL LOW (ref 12.0–15.0)
MCH: 27.4 pg (ref 26.0–34.0)
MCHC: 32.7 g/dL (ref 30.0–36.0)
MCV: 83.8 fL (ref 78.0–100.0)
Monocytes Absolute: 0.4 10*3/uL (ref 0.1–1.0)
Monocytes Relative: 8 % (ref 3–12)

## 2013-01-05 MED ORDER — MORPHINE SULFATE 4 MG/ML IJ SOLN
4.0000 mg | Freq: Once | INTRAMUSCULAR | Status: DC
  Filled 2013-01-05: qty 1

## 2013-01-05 MED ORDER — OXYCODONE-ACETAMINOPHEN 5-325 MG PO TABS: 1.0000 | ORAL_TABLET | ORAL | Status: DC | PRN

## 2013-01-05 MED ORDER — DOXYCYCLINE HYCLATE 100 MG PO TABS: 100.0000 mg | ORAL_TABLET | Freq: Two times a day (BID) | ORAL | Status: DC

## 2013-01-05 MED ORDER — HYDROCODONE-ACETAMINOPHEN 5-325 MG PO TABS: 1.0000 | ORAL_TABLET | ORAL | Status: DC | PRN

## 2013-01-05 MED ORDER — DOXYCYCLINE HYCLATE 100 MG PO TABS
100.0000 mg | ORAL_TABLET | Freq: Once | ORAL | Status: AC
  Administered 2013-01-05: 100 mg via ORAL
  Filled 2013-01-05: qty 1

## 2013-01-05 MED ORDER — MORPHINE SULFATE 4 MG/ML IJ SOLN
4.0000 mg | Freq: Once | INTRAMUSCULAR | Status: AC
  Administered 2013-01-05: 4 mg via INTRAMUSCULAR

## 2013-01-05 MED ORDER — HYDROCODONE-ACETAMINOPHEN 5-325 MG PO TABS
1.0000 | ORAL_TABLET | Freq: Once | ORAL | Status: AC
  Administered 2013-01-05: 1 via ORAL
  Filled 2013-01-05: qty 1

## 2013-01-05 NOTE — Patient Instructions (Addendum)
Cellulitis Cellulitis is an infection of the skin and the tissue beneath it. The infected area is usually red and tender. Cellulitis occurs most often in the arms and lower legs.  CAUSES  Cellulitis is caused by bacteria that enter the skin through cracks or cuts in the skin. The most common types of bacteria that cause cellulitis are Staphylococcus and Streptococcus. SYMPTOMS   Redness and warmth.  Swelling.  Tenderness or pain.  Fever. DIAGNOSIS  Your caregiver can usually determine what is wrong based on a physical exam. Blood tests may also be done. TREATMENT  Treatment usually involves taking an antibiotic medicine. HOME CARE INSTRUCTIONS   Take your antibiotics as directed. Finish them even if you start to feel better.  Keep the infected arm or leg elevated to reduce swelling.  Apply a warm cloth to the affected area up to 4 times per day to relieve pain.  Only take over-the-counter or prescription medicines for pain, discomfort, or fever as directed by your caregiver.  Keep all follow-up appointments as directed by your caregiver. SEEK MEDICAL CARE IF:   You notice red streaks coming from the infected area.  Your red area gets larger or turns dark in color.  Your bone or joint underneath the infected area becomes painful after the skin has healed.  Your infection returns in the same area or another area.  You notice a swollen bump in the infected area.  You develop new symptoms. SEEK IMMEDIATE MEDICAL CARE IF:   You have a fever.  You feel very sleepy.  You develop vomiting or diarrhea.  You have a general ill feeling (malaise) with muscle aches and pains. MAKE SURE YOU:   Understand these instructions.  Will watch your condition.  Will get help right away if you are not doing well or get worse. Document Released: 04/25/2005 Document Revised: 01/15/2012 Document Reviewed: 10/01/2011 [employer] Patient Information 2014 [employer].  

## 2013-01-05 NOTE — Progress Notes (Signed)
Patient ID: Wendy James, female   DOB: [birth year], 42 y.o.   MRN: [id number]  CC: Right lower extremity pain  HPI: Patient is 42 year old female who presents to clinic with progressively worsening right lower extremity pain. She explains that started in the foot area and one week prior to this visit. She went to emergency department and was sent home with prescription for NSAIDs. Patient describes persistent pain, starting in the foot and radiating to right knee area, 10 out of 10 in severity, constant, no specific alleviating factors including the over the counter medication. Patient says the pain is worse with ambulation and she is unable to sleep or bear weight on that extremity. She also reports subjective fevers and chills. She explains she has a few ulcers on her toes and foot but they healed properly. She is unaware of being diabetic.  No Known Allergies Past Medical History  Diagnosis Date  . Asthma   . Seasonal allergies   . Arthritis    Current Outpatient Prescriptions on File Prior to Visit  Medication Sig Dispense Refill  . albuterol (PROVENTIL HFA;VENTOLIN HFA) 108 (90 BASE) MCG/ACT inhaler Inhale 2 puffs into the lungs every 6 (six) hours as needed. For shortness of breath and wheezing      . albuterol (PROVENTIL) (2.5 MG/3ML) 0.083% nebulizer solution Take 2.5 mg by nebulization every 6 (six) hours as needed. For shortness of breath      . ibuprofen (ADVIL,MOTRIN) 200 MG tablet Take 400 mg by mouth every 6 (six) hours as needed for pain.      . naproxen (NAPROSYN) 500 MG tablet Take 1 tablet (500 mg total) by mouth 2 (two) times daily with a meal.  30 tablet  0  . predniSONE (DELTASONE) 20 MG tablet Take 3 pills in the morning for the first 4 days Take 2 pills in the morning for the next 4 days Take one pill in the morning for the last 4 days.  24 tablet  0   No current facility-administered medications on file prior to visit.   Family History  Problem Relation Age of  Onset  . Hypertension Mother   . Diabetes Mother   . Cancer Mother    History   Social History  . Marital Status: Single    Spouse Name: N/A    Number of Children: N/A  . Years of Education: N/A   Occupational History  . Not on file.   Social History Main Topics  . Smoking status: Former Games developer  . Smokeless tobacco: Never Used  . Alcohol Use: No  . Drug Use: No  . Sexually Active: Yes    Birth Control/ Protection: Condom   Other Topics Concern  . Not on file   Social History Narrative  . No narrative on file    Review of Systems  Constitutional: Negative for diaphoresis, activity change, appetite change and fatigue.  HENT: Negative for ear pain, nosebleeds, congestion, facial swelling, rhinorrhea, neck pain, neck stiffness and ear discharge.   Eyes: Negative for pain, discharge, redness, itching and visual disturbance.  Respiratory: Negative for cough, choking, chest tightness, shortness of breath, wheezing and stridor.   Cardiovascular: Negative for chest pain, palpitations and leg swelling.  Gastrointestinal: Negative for abdominal distention.  Genitourinary: Negative for dysuria, urgency, frequency, hematuria, flank pain, decreased urine volume, difficulty urinating and dyspareunia.  Musculoskeletal: Negative for back pain, joint swelling.  Neurological: Negative for dizziness, tremors, seizures, syncope, facial asymmetry, speech difficulty, weakness, light-headedness, numbness and headaches.  Hematological: Negative for adenopathy. Does not bruise/bleed easily.  Psychiatric/Behavioral: Negative for hallucinations, behavioral problems, confusion, dysphoric mood, decreased concentration and agitation.    Objective:   Filed Vitals:   01/05/13 1648  BP: 145/87  Pulse: 73  Temp: 99.1 F (37.3 C)  Resp: 18    Physical Exam  Constitutional: Appears well-developed and well-nourished. No distress.  HENT: Normocephalic. External right and left ear normal.  Oropharynx is clear and moist.  Eyes: Conjunctivae and EOM are normal. PERRLA, no scleral icterus.  Neck: Normal ROM. Neck supple. No JVD. No tracheal deviation. No thyromegaly.  CVS: RRR, S1/S2 +, no murmurs, no gallops, no carotid bruit.  Pulmonary: Effort and breath sounds normal, no stridor, rhonchi, wheezes, rales.  Abdominal: Soft. BS +,  no distension, tenderness, rebound or guarding.  Musculoskeletal: Normal range of motion.right lower extremity swollen and tight specifically in calf area, +1 pitting edema, tenderness to palpation, 1 ulcer on the bottom of the foot noted but he'll well with no blood or pus  Lymphadenopathy: No lymphadenopathy noted, cervical, inguinal. Neuro: Alert. Normal reflexes, muscle tone coordination. No cranial nerve deficit. Skin: Skin is warm and dry. No rash noted. Not diaphoretic. No erythema. No pallor.  Psychiatric: Normal mood and affect. Behavior, judgment, thought content normal.   Lab Results  Component Value Date   WBC 4.5 10/13/2012   HGB 11.6* 10/13/2012   HCT 35.4* 10/13/2012   MCV 85.7 10/13/2012   PLT 253 10/13/2012   Lab Results  Component Value Date   CREATININE 0.62 10/13/2012   BUN 10 10/13/2012   NA 136 10/13/2012   K 3.6 10/13/2012   CL 101 10/13/2012   CO2 26 10/13/2012    Lab Results  Component Value Date   HGBA1C  Value: 5.6 (NOTE)                                                                       According to the ADA Clinical Practice Recommendations for 2011, when HbA1c is used as a screening test:   >=6.5%   Diagnostic of Diabetes Mellitus           (if abnormal result  is confirmed)  5.7-6.4%   Increased risk of developing Diabetes Mellitus  References:Diagnosis and Classification of Diabetes Mellitus,Diabetes Care,2011,34(Suppl 1):S62-S69 and Standards of Medical Care in         Diabetes - 2011,Diabetes Care,2011,34  (Suppl 1):S11-S61. 08/23/2010   Lipid Panel  No results found for this basename: chol, trig, hdl, cholhdl, vldl,  ldlcalc   Assessment and plan:   Patient Active Problem List   Diagnosis Date Noted  .  right lower extremity pain  - concern for blood clot as Homans sign is positive, will send patient to call for venous Doppler to rule out lower extremity DVT. There is also possible infection, cellulitis giving low-grade fever. I will start patient on empiric antibiotic doxycycline with analgesia as needed for pain control. Patient advised to follow back with me in 2-3 days to evaluate the foot and leg area. Will obtain A1c, electrolyte panel today. Wound care consult will be placed so that patient can followup in outpatient setting and take care of the foot ulcers. 04/13/2012

## 2013-01-05 NOTE — Progress Notes (Signed)
Patient was recently seen in the ED for what was diagnosed as gout She complains of right foot pain-blister to bottom of foot as well as side of foot States it is very painful

## 2013-01-05 NOTE — ED Notes (Signed)
Pt complaining of pain in right foot with swelling and blisters. sts sent here to R/O blood clot. sts very painful.

## 2013-01-06 ENCOUNTER — Ambulatory Visit (HOSPITAL_COMMUNITY)
Admission: RE | Admit: 2013-01-06 | Discharge: 2013-01-06 | Disposition: A | Discharge status: Home / Self Care | Payer: Self-pay | Source: Ambulatory Visit | Attending: Emergency Medicine | Admitting: Emergency Medicine

## 2013-01-06 DIAGNOSIS — M79609 Pain in unspecified limb: Secondary | ICD-10-CM | POA: Insufficient documentation

## 2013-01-06 DIAGNOSIS — M7989 Other specified soft tissue disorders: Secondary | ICD-10-CM | POA: Insufficient documentation

## 2013-01-06 LAB — URIC ACID: Uric Acid, Serum: 3.8 mg/dL (ref 2.4–7.0)

## 2013-01-06 LAB — BASIC METABOLIC PANEL
Chloride: 106 mEq/L (ref 96–112)
Potassium: 3.9 mEq/L (ref 3.5–5.3)
Sodium: 139 mEq/L (ref 135–145)

## 2013-01-06 LAB — LIPID PANEL
LDL Cholesterol: 99 mg/dL (ref 0–99)
Total CHOL/HDL Ratio: 2.8 Ratio
VLDL: 20 mg/dL (ref 0–40)

## 2013-01-06 IMAGING — CT CT HEAD W/O CM
1 series · 15 of 30 positions shown, 19 images · non-contrast
Comparison: 11/05/2005.

CLINICAL DATA: 41-year-old female with dizziness, high blood
pressure, lightheaded.

CT HEAD WITHOUT CONTRAST
TECHNIQUE: Contiguous axial images were obtained from the base of
the skull through the vertex without contrast.

[Series 2: headseq 4.8 h45s · axial · 0.41mm/px · z∈[+1094,+1246]mm · 15 of 36 slices shown, 19 images]
[im 2/36  brain]
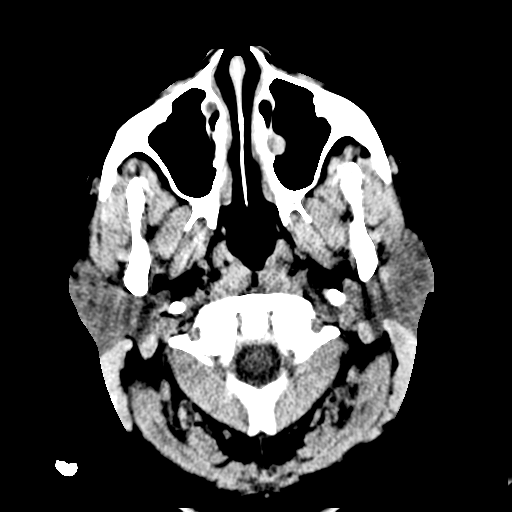
[im 2/36  bone]
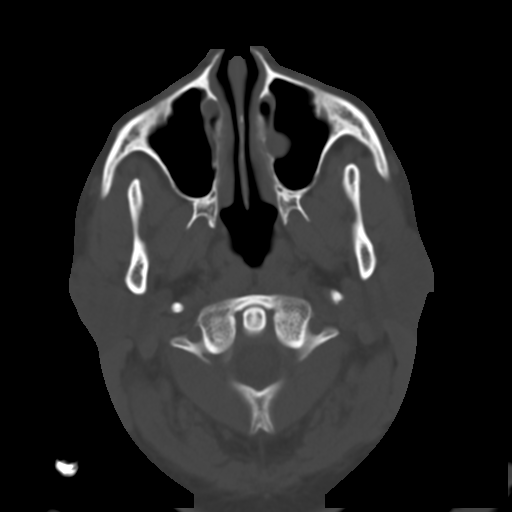
[im 4/36  brain]
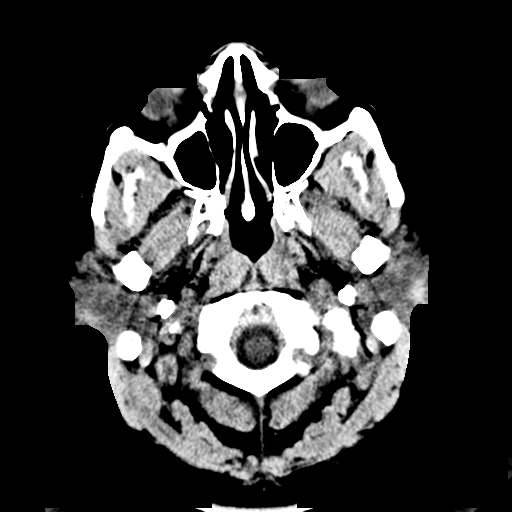
[im 7/36  brain]
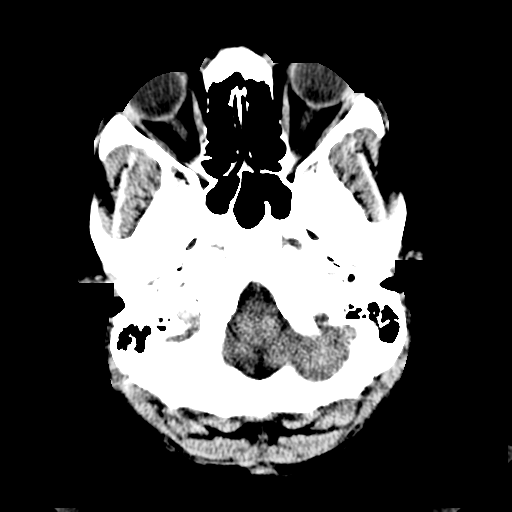
[im 9/36  brain]
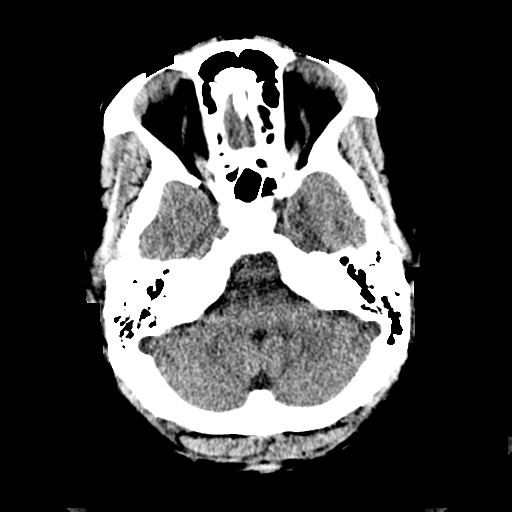
[im 11/36  brain]
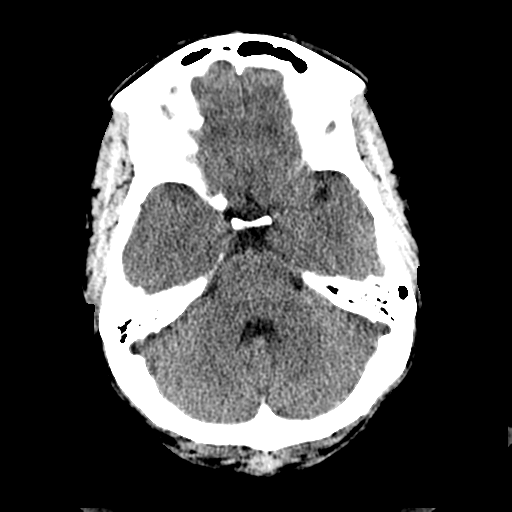
[im 11/36  bone]
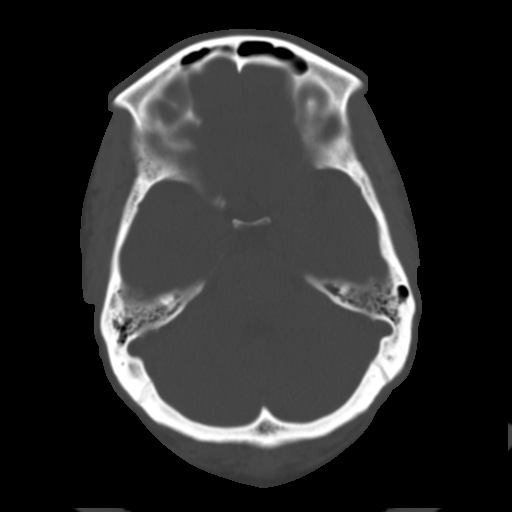
[im 14/36  brain]
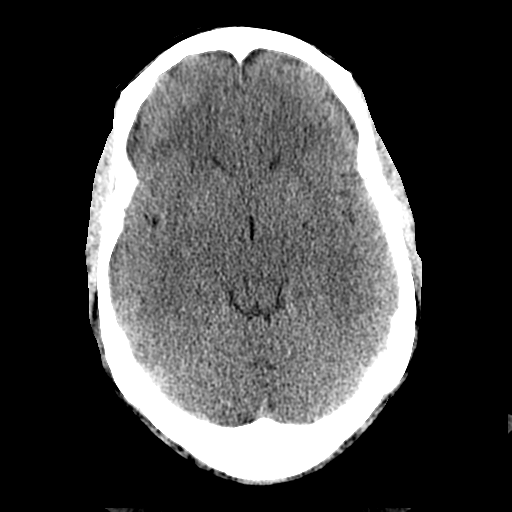
[im 16/36  brain]
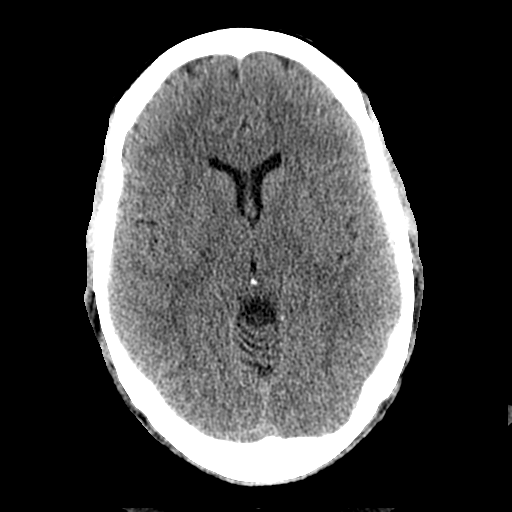
[im 19/36  brain]
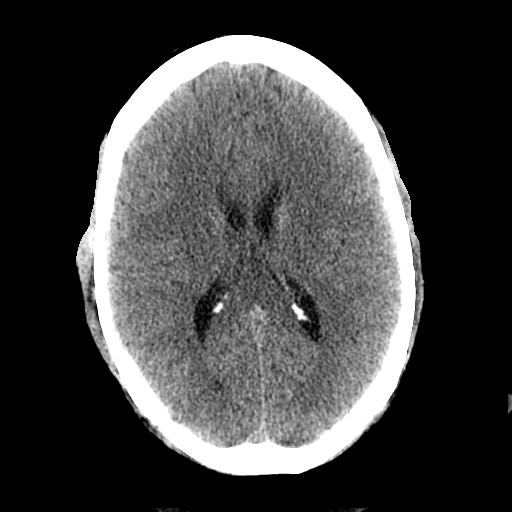
[im 20/36  brain]
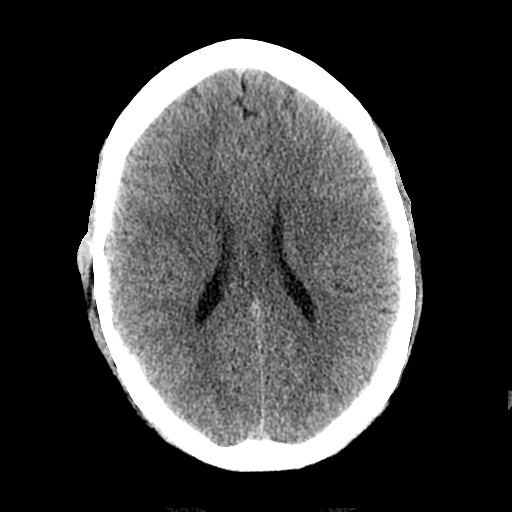
[im 20/36  bone]
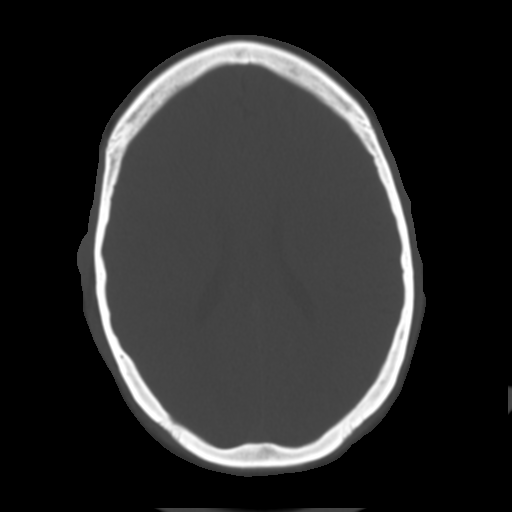
[im 22/36  brain]
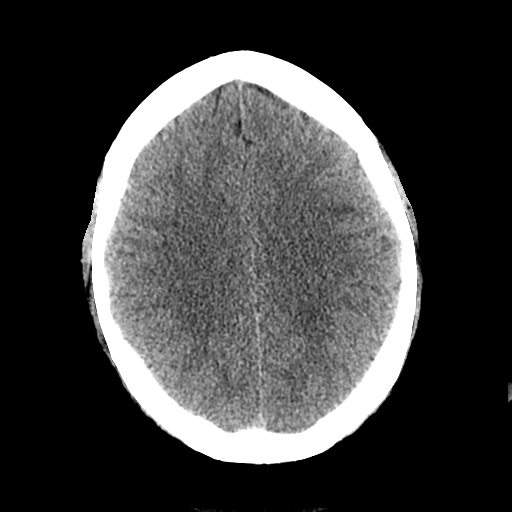
[im 25/36  brain]
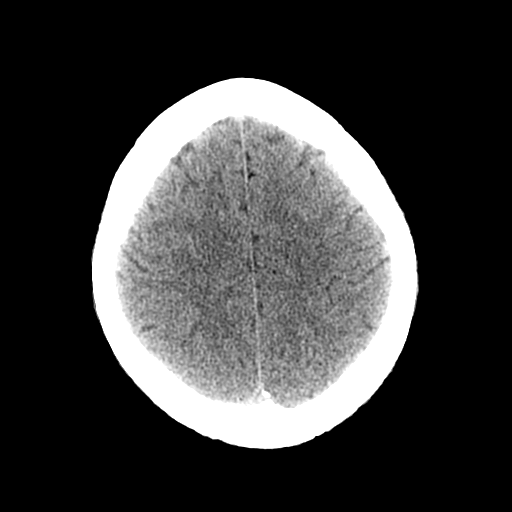
[im 27/36  brain]
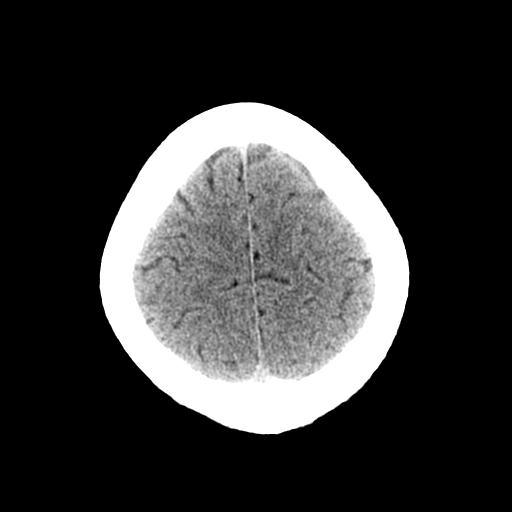
[im 29/36  brain]
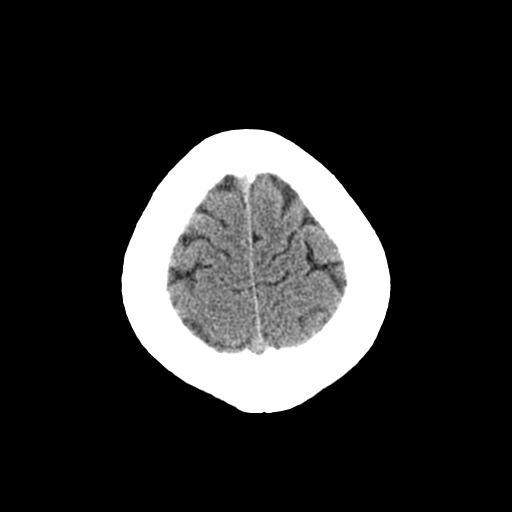
[im 29/36  bone]
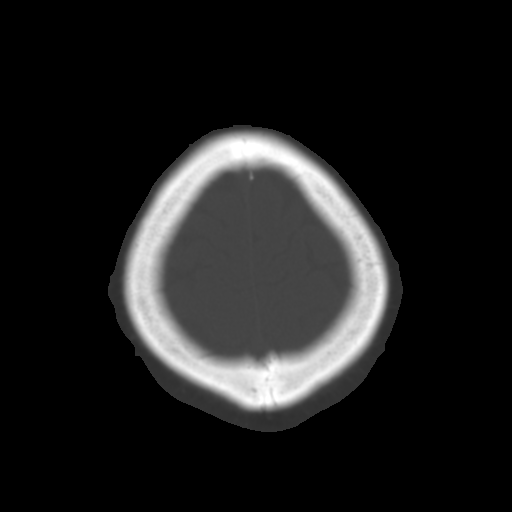
[im 32/36  brain]
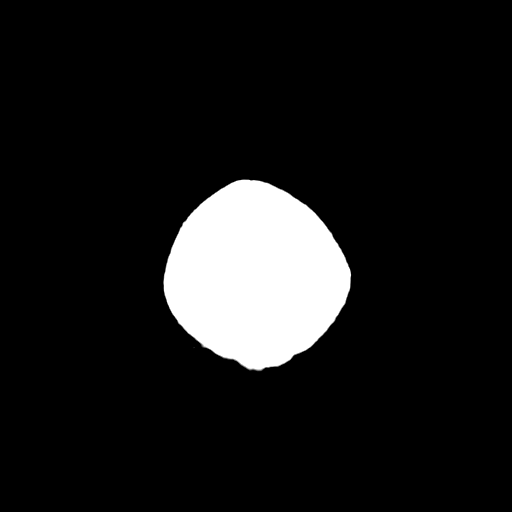
[im 34/36  brain]
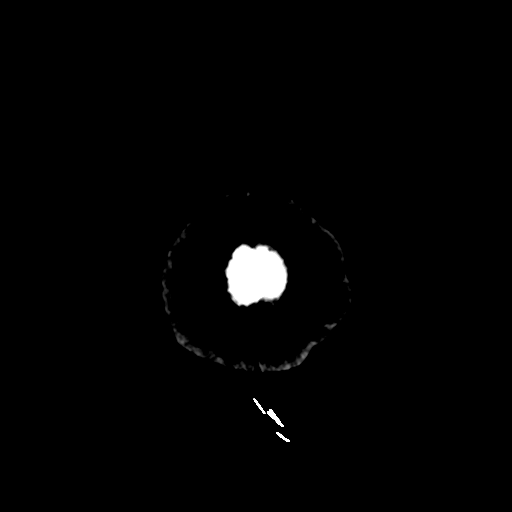

[15 of 30 positions shown; findings below may reference images not displayed]

FINDINGS: Small left maxillary sinus mucous retention cyst re-
identified.  Otherwise clear paranasal sinuses and mastoids. No
acute osseous abnormality identified.  Stable small central
sclerosis in the clivus, likely developmental. Visualized orbits
and scalp soft tissues are within normal limits.

Stable and normal cerebral volume.  No ventriculomegaly. No midline
shift, mass effect, or evidence of mass lesion.  Increased
subcortical white matter hypodensity, most pronounced in the left
frontal lobe on image 21, but this was preexisting. No evidence of
cortically based acute infarction identified.  No suspicious
intracranial vascular hyperdensity. No acute intracranial
hemorrhage identified.
IMPRESSION: Nonspecific cerebral white matter changes with some progression
since 8445. No acute intracranial abnormality.

## 2013-01-06 NOTE — ED Provider Notes (Signed)
History     CSN: [id number]  Arrival date & time 01/05/13  1755   First MD Initiated Contact with Patient 01/05/13 1851      Chief Complaint  Patient presents with  . Foot Pain    (Consider location/radiation/quality/duration/timing/severity/associated sxs/prior treatment) HPI Pt seen by PMD and referred to ED for [place] to r/o RLE DVT. Pt has had swelling and pain >1 week. Initially diagnosed with gout and d/c'd home with NSAID's. Pain and warmth have persisted. PMD started pt on doxy but pt has yet to fill RX. No history of DM. No fever, chills, N/V fatigue or malaise. No recent travel/surgery, fam hx of PE/DVt, oral birth control pills. No injury.  Past Medical History  Diagnosis Date  . Asthma   . Seasonal allergies   . Arthritis     Past Surgical History  Procedure Laterality Date  . Cholecystectomy    . Cesarean section    . Tubal ligation      Family History  Problem Relation Age of Onset  . Hypertension Mother   . Diabetes Mother   . Cancer Mother     History  Substance Use Topics  . Smoking status: Former Games developer  . Smokeless tobacco: Never Used  . Alcohol Use: No    OB History   Grav Para Term Preterm Abortions TAB SAB Ect Mult Living   5 4 4  0 1 0 1 0 0 3      Review of Systems  Constitutional: Negative for fever, chills and fatigue.  HENT: Negative for neck pain and neck stiffness.   Respiratory: Negative for shortness of breath.   Cardiovascular: Positive for leg swelling. Negative for chest pain and palpitations.  Gastrointestinal: Negative for nausea, vomiting, abdominal pain, diarrhea and constipation.  Genitourinary: Negative for flank pain.  Musculoskeletal: Positive for myalgias and arthralgias. Negative for back pain.  Skin: Negative for rash and wound.  Neurological: Negative for dizziness, weakness, light-headedness, numbness and headaches.  All other systems reviewed and are negative.    Allergies  Review of patient's allergies  indicates no known allergies.  Home Medications   Current Outpatient Rx  Name  Route  Sig  Dispense  Refill  . albuterol (PROVENTIL HFA;VENTOLIN HFA) 108 (90 BASE) MCG/ACT inhaler   Inhalation   Inhale 2 puffs into the lungs every 6 (six) hours as needed. For shortness of breath and wheezing         . albuterol (PROVENTIL) (2.5 MG/3ML) 0.083% nebulizer solution   Nebulization   Take 2.5 mg by nebulization every 6 (six) hours as needed. For shortness of breath         . doxycycline (VIBRA-TABS) 100 MG tablet   Oral   Take 1 tablet (100 mg total) by mouth 2 (two) times daily.   20 tablet   0   . HYDROcodone-acetaminophen (NORCO) 5-325 MG per tablet   Oral   Take 1 tablet by mouth every 4 (four) hours as needed for pain.   20 tablet   0   . oxyCODONE-acetaminophen (PERCOCET/ROXICET) 5-325 MG per tablet   Oral   Take 1 tablet by mouth every 4 (four) hours as needed for pain.   65 tablet   0   . predniSONE (DELTASONE) 20 MG tablet      Take 3 pills in the morning for the first 4 days Take 2 pills in the morning for the next 4 days Take one pill in the morning for the last 4 days.  24 tablet   0     BP 125/88  Pulse 66  Temp(Src) 98.5 F (36.9 C) (Oral)  Resp 18  SpO2 100%  LMP 12/22/2012  Physical Exam  Nursing note and vitals reviewed. Constitutional: She is oriented to person, place, and time. She appears well-developed and well-nourished. No distress.  HENT:  Head: Normocephalic and atraumatic.  Mouth/Throat: Oropharynx is clear and moist.  Eyes: EOM are normal. Pupils are equal, round, and reactive to light.  Neck: Normal range of motion. Neck supple.  Cardiovascular: Normal rate and regular rhythm.   Pulmonary/Chest: Effort normal and breath sounds normal. No respiratory distress. She has no wheezes. She has no rales. She exhibits no tenderness.  Abdominal: Soft. Bowel sounds are normal. She exhibits no distension and no mass. There is no tenderness.  There is no rebound and no guarding.  Musculoskeletal: Normal range of motion. She exhibits edema (RLE edema and diffuse tenderness below knee. 2+ DP. +warmth. No definite cellulitis or evidence of injury). She exhibits no tenderness.  FROM in all joints. No discreet joint swelling or tenderness  Neurological: She is alert and oriented to person, place, and time.  Moves all ext without deficit, sensation intact  Skin: Skin is warm and dry. No rash noted. No erythema.  Psychiatric: She has a normal mood and affect. Her behavior is normal.    ED Course  Procedures (including critical care time)  Labs Reviewed  CBC WITH DIFFERENTIAL - Abnormal; Notable for the following:    Hemoglobin 11.0 (*)    HCT 33.6 (*)    Neutrophils Relative % 39 (*)    Lymphocytes Relative 48 (*)    All other components within normal limits  COMPREHENSIVE METABOLIC PANEL - Abnormal; Notable for the following:    Potassium 3.4 (*)    All other components within normal limits   No results found.   1. Pain, lower extremity, right       MDM  Will give 1 st dose of abx in ED. Pt to return in AM for scheduled RLE [place] to r/o DVT        Loren Racer, MD 01/06/13 [phone]

## 2013-01-06 NOTE — Progress Notes (Signed)
VASCULAR LAB PRELIMINARY  PRELIMINARY  PRELIMINARY  PRELIMINARY  Right lower extremity venous duplex completed.    Preliminary report:  Right:  No evidence of DVT, superficial thrombosis, or Baker's cyst.  Lymph nodes prominent in the right groin.  Camdin Hegner, RVT 01/06/2013, 8:40 AM

## 2013-01-08 ENCOUNTER — Other Ambulatory Visit: Payer: Self-pay | Admitting: Internal Medicine

## 2013-01-08 MED ORDER — SULFAMETHOXAZOLE-TRIMETHOPRIM 800-160 MG PO TABS: 1.0000 | ORAL_TABLET | Freq: Two times a day (BID) | ORAL | Status: AC

## 2013-01-09 ENCOUNTER — Ambulatory Visit: Payer: Self-pay | Attending: Family Medicine | Admitting: Family Medicine

## 2013-01-09 VITALS — BP 127/77 | HR 89 | Temp 98.4°F | Resp 16

## 2013-01-09 DIAGNOSIS — R21 Rash and other nonspecific skin eruption: Secondary | ICD-10-CM

## 2013-01-09 DIAGNOSIS — X58XXXA Exposure to other specified factors, initial encounter: Secondary | ICD-10-CM | POA: Insufficient documentation

## 2013-01-09 DIAGNOSIS — IMO0002 Reserved for concepts with insufficient information to code with codable children: Secondary | ICD-10-CM | POA: Insufficient documentation

## 2013-01-09 NOTE — Patient Instructions (Signed)
1800 Calorie Diet for Diabetes Meal Planning The 1800 calorie diet is designed for eating up to 1800 calories each day. Following this diet and making healthy meal choices can help improve overall health. This diet controls blood sugar (glucose) levels and can also help lower blood pressure and cholesterol. SERVING SIZES Measuring foods and serving sizes helps to make sure you are getting the right amount of food. The list below tells how big or small some common serving sizes are:  1 oz.........4 stacked dice.  3 oz.........Deck of cards.  1 tsp........Tip of little finger.  1 tbs........Thumb.  2 tbs........Golf ball.   cup.......Half of a fist.  1 cup........A fist. GUIDELINES FOR CHOOSING FOODS The goal of this diet is to eat a variety of foods and limit calories to 1800 each day. This can be done by choosing foods that are low in calories and fat. The diet also suggests eating small amounts of food frequently. Doing this helps control your blood glucose levels so they do not get too high or too low. Each meal or snack may include a protein food source to help you feel more satisfied and to stabilize your blood glucose. Try to eat about the same amount of food around the same time each day. This includes weekend days, travel days, and days off work. Space your meals about 4 to 5 hours apart and add a snack between them if you wish.  For example, a daily food plan could include breakfast, a morning snack, lunch, dinner, and an evening snack. Healthy meals and snacks include whole grains, vegetables, fruits, lean meats, poultry, fish, and dairy products. As you plan your meals, select a variety of foods. Choose from the bread and starch, vegetable, fruit, dairy, and meat/protein groups. Examples of foods from each group and their suggested serving sizes are listed below. Use measuring cups and spoons to become familiar with what a healthy portion looks like. Bread and Starch Each serving  equals 15 grams of carbohydrates.  1 slice bread.   bagel.   cup cold cereal (unsweetened).   cup hot cereal or mashed potatoes.  1 small potato (size of a computer mouse).   cup cooked pasta or rice.   English muffin.  1 cup broth-based soup.  3 cups of popcorn.  4 to 6 whole-wheat crackers.   cup cooked beans, peas, or corn. Vegetable Each serving equals 5 grams of carbohydrates.   cup cooked vegetables.  1 cup raw vegetables.   cup tomato or vegetable juice. Fruit Each serving equals 15 grams of carbohydrates.  1 small apple or orange.  1 cup watermelon or strawberries.   cup applesauce (no sugar added).  2 tbs raisins.   banana.   cup canned fruit, packed in water, its own juice, or sweetened with a sugar substitute.   cup unsweetened fruit juice. Dairy Each serving equals 12 to 15 grams of carbohydrates.  1 cup fat-free milk.  6 oz artificially sweetened yogurt or plain yogurt.  1 cup low-fat buttermilk.  1 cup soy milk.  1 cup almond milk. Meat/Protein  1 large egg.  2 to 3 oz meat, poultry, or fish.   cup low-fat cottage cheese.  1 tbs peanut butter.  1 oz low-fat cheese.   cup tuna in water.   cup tofu. Fat  1 tsp oil.  1 tsp trans-fat-free margarine.  1 tsp butter.  1 tsp mayonnaise.  2 tbs avocado.  1 tbs salad dressing.  1 tbs cream cheese.    2 tbs sour cream. SAMPLE 1800 CALORIE DIET PLAN Breakfast   cup unsweetened cereal (1 carb serving).  1 cup fat-free milk (1 carb serving).  1 slice whole-wheat toast (1 carb serving).   small banana (1 carb serving).  1 scrambled egg.  1 tsp trans-fat-free margarine. Lunch  Tuna sandwich.  2 slices whole-wheat bread (2 carb servings).   cup canned tuna in water, drained.  1 tbs reduced fat mayonnaise.  1 stalk celery, chopped.  2 slices tomato.  1 lettuce leaf.  1 cup carrot sticks.  24 to 30 seedless grapes (2 carb  servings).  6 oz light yogurt (1 carb serving). Afternoon Snack  3 graham cracker squares (1 carb serving).  Fat-free milk, 1 cup (1 carb serving).  1 tbs peanut butter. Dinner  3 oz salmon, broiled with 1 tsp oil.  1 cup mashed potatoes (2 carb servings) with 1 tsp trans-fat-free margarine.  1 cup fresh or frozen green beans.  1 cup steamed asparagus.  1 cup fat-free milk (1 carb serving). Evening Snack  3 cups air-popped popcorn (1 carb serving).  2 tbs parmesan cheese sprinkled on top. MEAL PLAN Use this worksheet to help you make a daily meal plan based on the 1800 calorie diet suggestions. If you are using this plan to help you control your blood glucose, you may interchange carbohydrate-containing foods (dairy, starches, and fruits). Select a variety of fresh foods of varying colors and flavors. The total amount of carbohydrate in your meals or snacks is more important than making sure you include all of the food groups every time you eat. Choose from the following foods to build your day's meals:  8 Starches.  4 Vegetables.  3 Fruits.  2 Dairy.  6 to 7 oz Meat/Protein.  Up to 4 Fats. Your dietician can use this worksheet to help you decide how many servings and which types of foods are right for you. BREAKFAST Food Group and Servings / Food Choice Starch ________________________________________________________ Dairy _________________________________________________________ Fruit _________________________________________________________ Meat/Protein __________________________________________________ Fat ___________________________________________________________ LUNCH Food Group and Servings / Food Choice Starch ________________________________________________________ Meat/Protein __________________________________________________ Vegetable _____________________________________________________ Fruit  _________________________________________________________ Dairy _________________________________________________________ Fat ___________________________________________________________ AFTERNOON SNACK Food Group and Servings / Food Choice Starch ________________________________________________________ Meat/Protein __________________________________________________ Fruit __________________________________________________________ Dairy _________________________________________________________ DINNER Food Group and Servings / Food Choice Starch _________________________________________________________ Meat/Protein ___________________________________________________ Dairy __________________________________________________________ Vegetable ______________________________________________________ Fruit ___________________________________________________________ Fat ____________________________________________________________ EVENING SNACK Food Group and Servings / Food Choice Fruit __________________________________________________________ Meat/Protein ___________________________________________________ Dairy __________________________________________________________ Starch _________________________________________________________ DAILY TOTALS Starch ____________________________ Vegetable _________________________ Fruit _____________________________ Dairy _____________________________ Meat/Protein______________________ Fat _______________________________ Document Released: 02/05/2005 Document Revised: 10/08/2011 Document Reviewed: 06/01/2011 [employer] Patient Information 2014 [employer].  

## 2013-01-09 NOTE — Progress Notes (Signed)
Patient ID: Wendy James, female   DOB: 11-05-1970, 42 y.o.   MRN: [id number]  CC: follow up   HPI: Pt was seen here recently and in the ER for a painful right leg and blisters.  She was ruled out for DVT.  She was placed on antibiotics and pain medications.  The patient says that the percocet prescribed was too strong for her and she did not take any more.  She says that she spoke with Dr. Lenise Arena last night and she prescribed a different antibiotic and pt says Dr Lenise Arena will prescribe her a different pain medication. She says that the blisters are healing better now and she says that she is ambulating better.    No Known Allergies Past Medical History  Diagnosis Date  . Asthma   . Seasonal allergies   . Arthritis    Current Outpatient Prescriptions on File Prior to Visit  Medication Sig Dispense Refill  . albuterol (PROVENTIL HFA;VENTOLIN HFA) 108 (90 BASE) MCG/ACT inhaler Inhale 2 puffs into the lungs every 6 (six) hours as needed. For shortness of breath and wheezing      . albuterol (PROVENTIL) (2.5 MG/3ML) 0.083% nebulizer solution Take 2.5 mg by nebulization every 6 (six) hours as needed. For shortness of breath      . doxycycline (VIBRA-TABS) 100 MG tablet Take 1 tablet (100 mg total) by mouth 2 (two) times daily.  20 tablet  0  . HYDROcodone-acetaminophen (NORCO) 5-325 MG per tablet Take 1 tablet by mouth every 4 (four) hours as needed for pain.  20 tablet  0  . oxyCODONE-acetaminophen (PERCOCET/ROXICET) 5-325 MG per tablet Take 1 tablet by mouth every 4 (four) hours as needed for pain.  65 tablet  0  . predniSONE (DELTASONE) 20 MG tablet Take 3 pills in the morning for the first 4 days Take 2 pills in the morning for the next 4 days Take one pill in the morning for the last 4 days.  24 tablet  0  . sulfamethoxazole-trimethoprim (SEPTRA DS) 800-160 MG per tablet Take 1 tablet by mouth 2 (two) times daily.  28 tablet  0   No current facility-administered medications on file prior  to visit.   Family History  Problem Relation Age of Onset  . Hypertension Mother   . Diabetes Mother   . Cancer Mother    History   Social History  . Marital Status: Single    Spouse Name: N/A    Number of Children: N/A  . Years of Education: N/A   Occupational History  . Not on file.   Social History Main Topics  . Smoking status: Former Games developer  . Smokeless tobacco: Never Used  . Alcohol Use: No  . Drug Use: No  . Sexually Active: Yes    Birth Control/ Protection: Condom   Other Topics Concern  . Not on file   Social History Narrative  . No narrative on file    Review of Systems  Constitutional: Negative for fever, chills, diaphoresis, activity change, appetite change and fatigue.  HENT: Negative for ear pain, nosebleeds, congestion, facial swelling, rhinorrhea, neck pain, neck stiffness and ear discharge.   Eyes: Negative for pain, discharge, redness, itching and visual disturbance.  Respiratory: Negative for cough, choking, chest tightness, shortness of breath, wheezing and stridor.   Cardiovascular: Negative for chest pain, palpitations and leg swelling.  Gastrointestinal: Negative for abdominal distention.  Genitourinary: Negative for dysuria, urgency, frequency, hematuria, flank pain, decreased urine volume, difficulty urinating and  dyspareunia.  Musculoskeletal: Negative for back pain, joint swelling, arthralgias and gait problem.  Neurological: Negative for dizziness, tremors, seizures, syncope, facial asymmetry, speech difficulty, weakness, light-headedness, numbness and headaches.  Hematological: Negative for adenopathy. Does not bruise/bleed easily.  Psychiatric/Behavioral: Negative for hallucinations, behavioral problems, confusion, dysphoric mood, decreased concentration and agitation.    Objective:   Filed Vitals:   01/09/13 1353  BP: 127/77  Pulse: 89  Temp: 98.4 F (36.9 C)  Resp: 16    Physical Exam  Constitutional: Appears well-developed  and well-nourished. No distress.  HENT: Normocephalic. External right and left ear normal. Oropharynx is clear and moist.  Eyes: Conjunctivae and EOM are normal. PERRLA, no scleral icterus.  Neck: Normal ROM. Neck supple. No JVD. No tracheal deviation. No thyromegaly.  CVS: RRR, S1/S2 +, no murmurs, no gallops, no carotid bruit.  Pulmonary: Effort and breath sounds normal, no stridor, rhonchi, wheezes, rales.  Abdominal: Soft. BS +,  no distension, tenderness, rebound or guarding.  Musculoskeletal: Normal range of motion. No edema and no tenderness.  Lymphadenopathy: No lymphadenopathy noted, cervical, inguinal. Neuro: Alert. Normal reflexes, muscle tone coordination. No cranial nerve deficit. Skin: Skin is warm and dry.blisters on bottom and lateral right foot, appears like a contact dermatitis  Psychiatric: Normal mood and affect. Behavior, judgment, thought content normal.   Lab Results  Component Value Date   WBC 5.4 01/05/2013   HGB 11.0* 01/05/2013   HCT 33.6* 01/05/2013   MCV 83.8 01/05/2013   PLT 251 01/05/2013   Lab Results  Component Value Date   CREATININE 0.70 01/05/2013   BUN 12 01/05/2013   NA 138 01/05/2013   K 3.4* 01/05/2013   CL 105 01/05/2013   CO2 26 01/05/2013    Lab Results  Component Value Date   HGBA1C 5.7* 01/05/2013   Lipid Panel     Component Value Date/Time   CHOL 186 01/05/2013 1737   TRIG 99 01/05/2013 1737   HDL 67 01/05/2013 1737   CHOLHDL 2.8 01/05/2013 1737   VLDL 20 01/05/2013 1737   LDLCALC 99 01/05/2013 1737     Assessment and plan:   Patient Active Problem List   Diagnosis Date Noted  . Elevated BP 04/13/2012  . Bronchitis, acute 04/12/2012  . Hypokalemia 04/10/2012  . Acute asthma exacerbation 03/12/2012  . Acute respiratory failure 03/12/2012  . Asthma in adult 03/12/2012  . Chest tightness 03/12/2012  . Menorrhagia 01/10/2012  . Dysmenorrhea 01/10/2012       Prediabetes  Pt has healing blisters and a dermatitis on skin of right foot - will refer her to  dermatology for further evaluation and management.    Discussed prediabetes and diet recommendations and proper foot care   Follow up in 3 weeks  The patient was given clear instructions to go to ER or return to medical center if symptoms don't improve, worsen or new problems develop.  The patient verbalized understanding.  The patient was told to call to get lab results if they haven't heard anything in the next week.    Rodney Langton, MD, CDE, FAAFP [facility] [place], [place]

## 2013-01-09 NOTE — Progress Notes (Signed)
Patient here for follow up Pain to bottom of right foot and leg

## 2013-01-22 ENCOUNTER — Encounter (HOSPITAL_BASED_OUTPATIENT_CLINIC_OR_DEPARTMENT_OTHER): Payer: Self-pay

## 2013-01-30 IMAGING — CR DG NECK SOFT TISSUE
2 series · 2 of 2 positions shown · non-contrast
Comparison: CT head 03/15/2012

CLINICAL DATA: Cough and shortness of breath.  Tightness in the
neck.

NECK SOFT TISSUES - 1+ VIEW

[w soft tissue neck ap (1 of 2)]
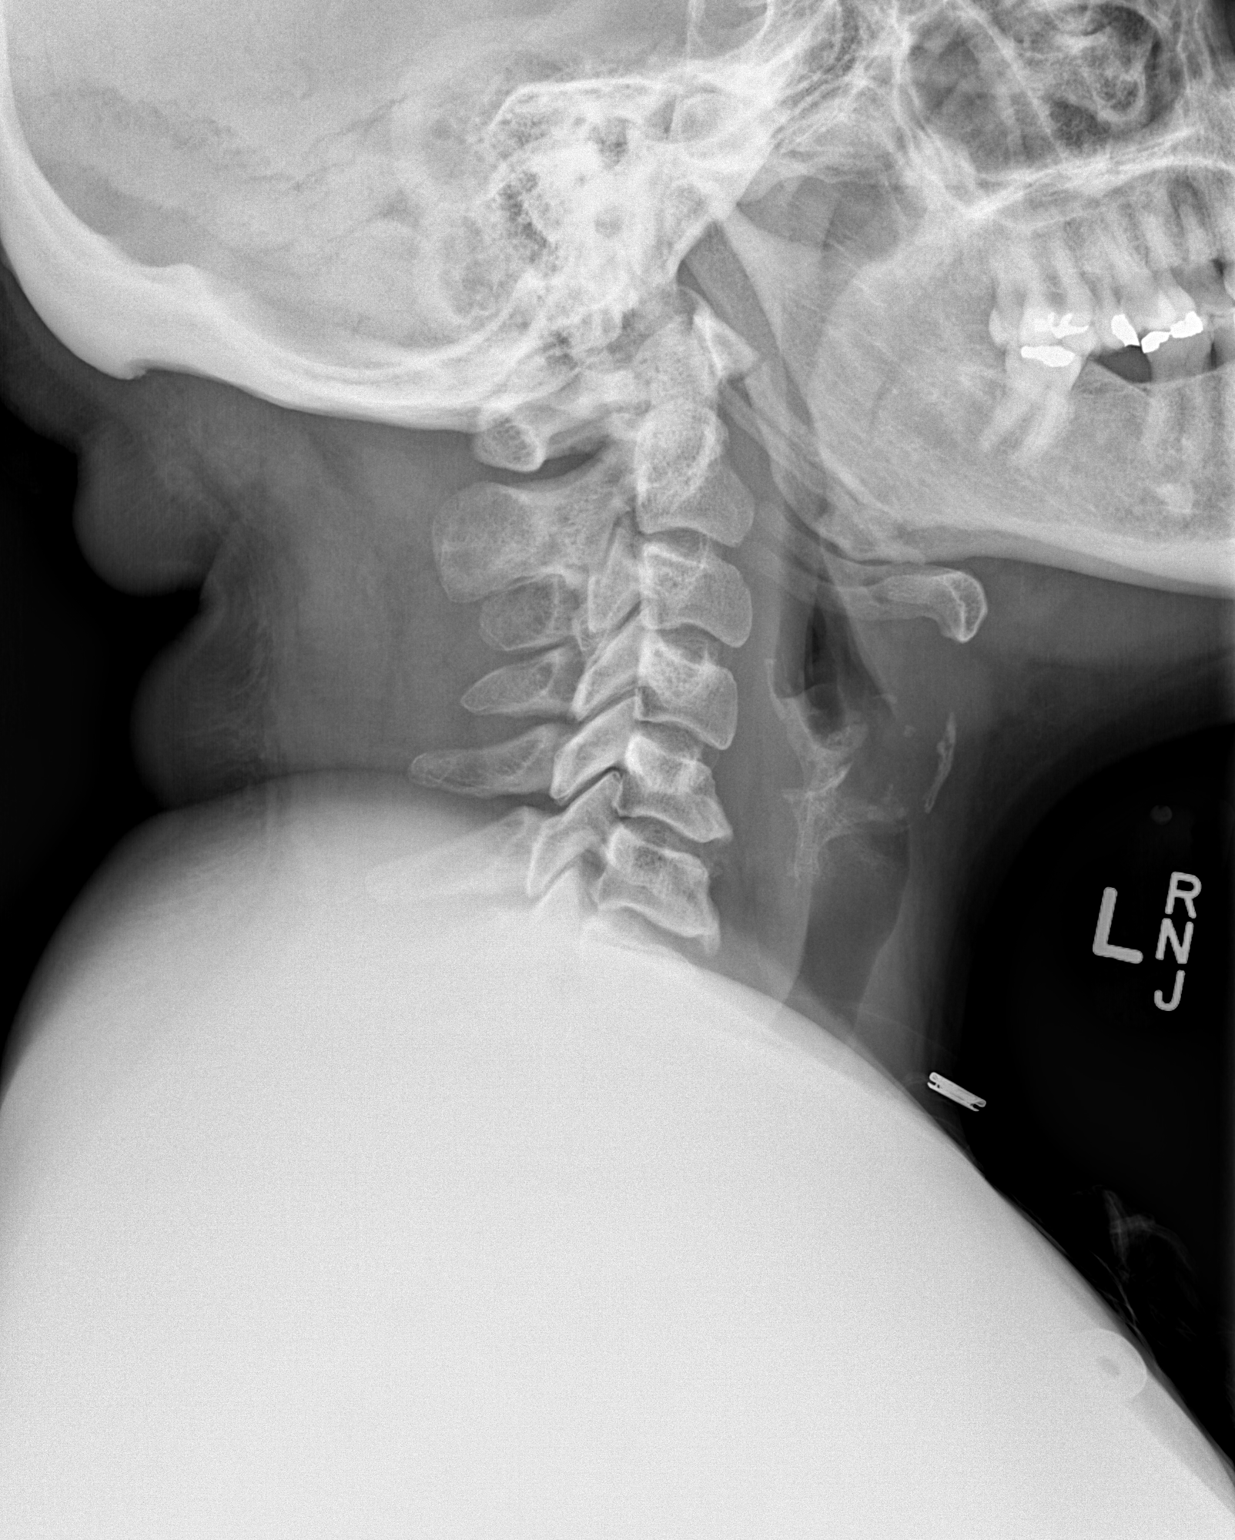

[w soft tissue neck ap (2 of 2)]
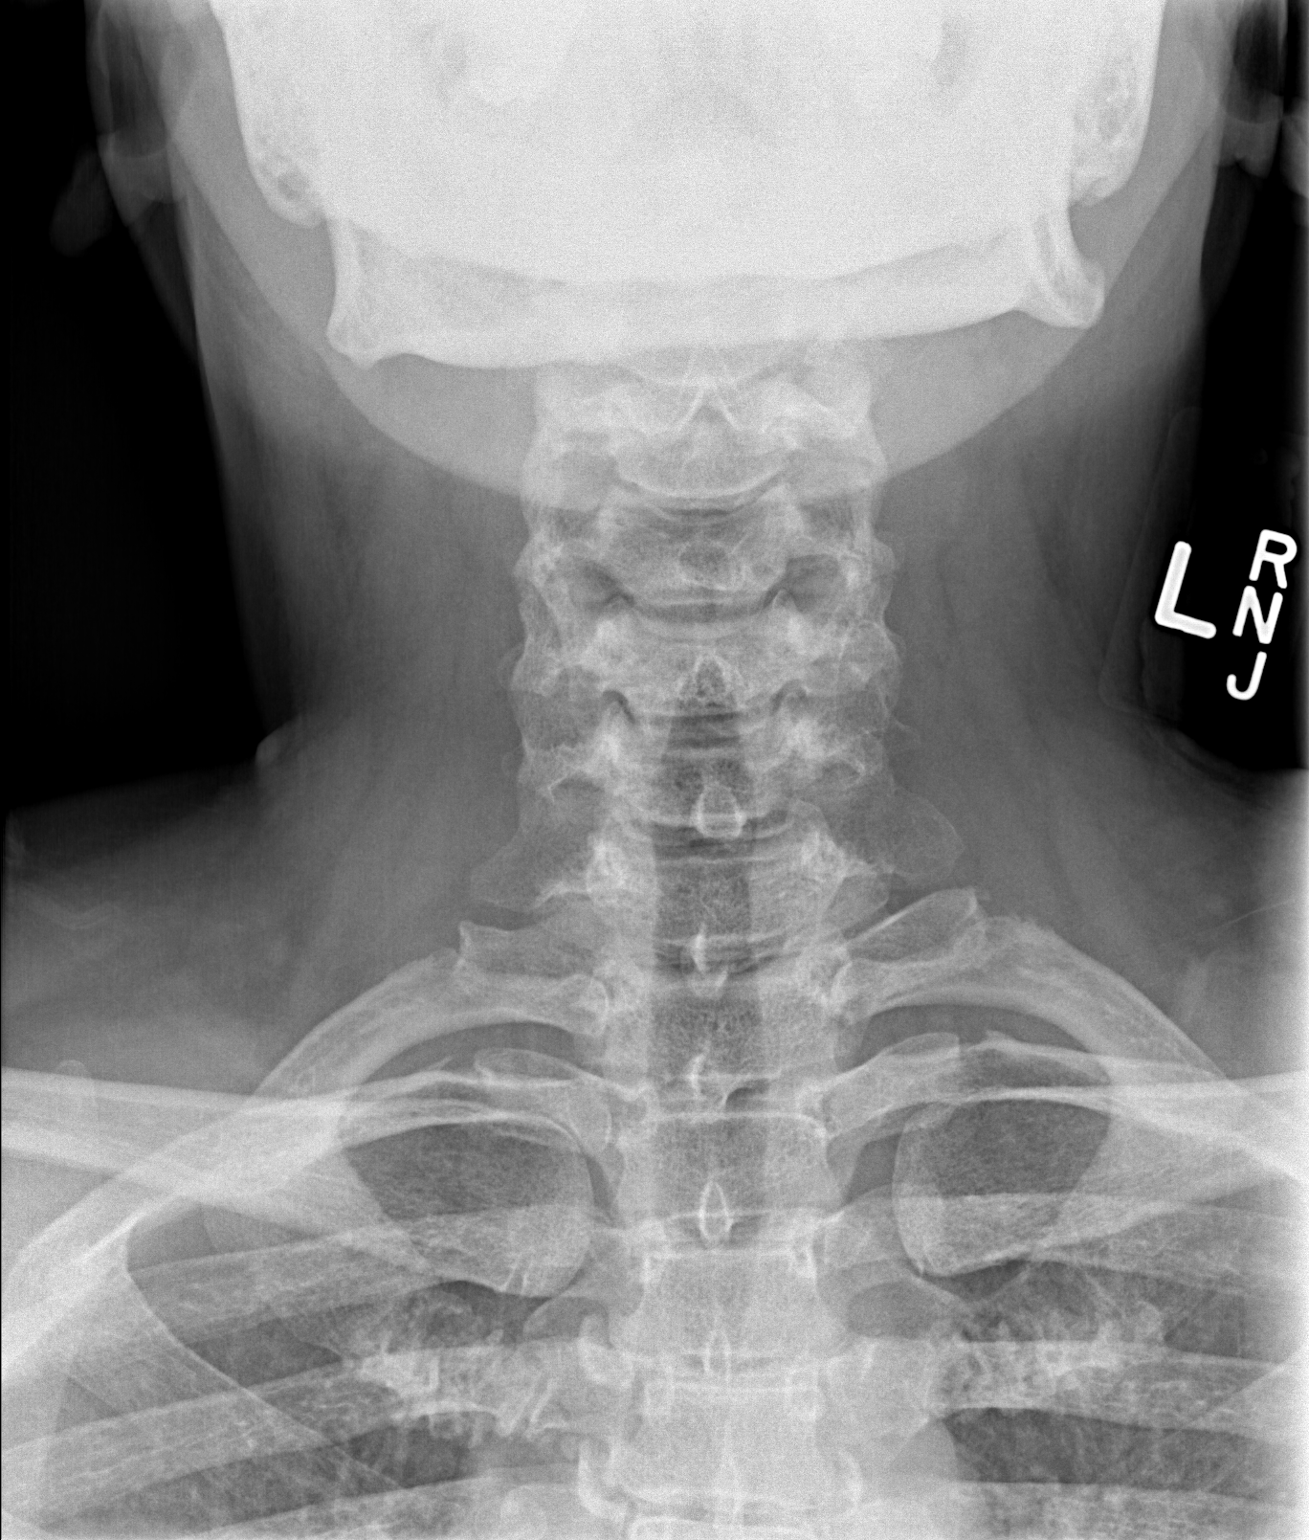

[2 of 2 positions shown; findings below may reference images not displayed]

FINDINGS: The hypopharynx and cervical airway appear patent.  No
prevertebral or submental soft tissue swelling.  The epiglottis is
compressed and therefore partially obscured but there is no
evidence of significant thickening of the epiglottis or
aryepiglottic folds.  Degenerative changes in the cervical spine.
No radiopaque foreign bodies.
IMPRESSION: No evidence of hypopharyngeal obstruction or inflammation.

## 2013-03-05 ENCOUNTER — Encounter (HOSPITAL_COMMUNITY): Payer: Self-pay | Admitting: Internal Medicine

## 2013-03-05 ENCOUNTER — Emergency Department (HOSPITAL_COMMUNITY): Payer: Medicaid Other

## 2013-03-05 ENCOUNTER — Inpatient Hospital Stay (HOSPITAL_COMMUNITY)
Admission: EM | Admit: 2013-03-05 | Discharge: 2013-03-15 | DRG: 203 | Disposition: A | Discharge status: Home / Self Care | Payer: Medicaid Other | Attending: Internal Medicine | Admitting: Internal Medicine

## 2013-03-05 DIAGNOSIS — J40 Bronchitis, not specified as acute or chronic: Secondary | ICD-10-CM

## 2013-03-05 DIAGNOSIS — J9600 Acute respiratory failure, unspecified whether with hypoxia or hypercapnia: Secondary | ICD-10-CM

## 2013-03-05 DIAGNOSIS — J4541 Moderate persistent asthma with (acute) exacerbation: Secondary | ICD-10-CM

## 2013-03-05 DIAGNOSIS — J4521 Mild intermittent asthma with (acute) exacerbation: Secondary | ICD-10-CM

## 2013-03-05 DIAGNOSIS — D649 Anemia, unspecified: Secondary | ICD-10-CM | POA: Diagnosis present

## 2013-03-05 DIAGNOSIS — Z79899 Other long term (current) drug therapy: Secondary | ICD-10-CM

## 2013-03-05 DIAGNOSIS — IMO0001 Reserved for inherently not codable concepts without codable children: Secondary | ICD-10-CM

## 2013-03-05 DIAGNOSIS — E876 Hypokalemia: Secondary | ICD-10-CM

## 2013-03-05 DIAGNOSIS — J45909 Unspecified asthma, uncomplicated: Secondary | ICD-10-CM

## 2013-03-05 DIAGNOSIS — N920 Excessive and frequent menstruation with regular cycle: Secondary | ICD-10-CM | POA: Diagnosis present

## 2013-03-05 DIAGNOSIS — Z87891 Personal history of nicotine dependence: Secondary | ICD-10-CM

## 2013-03-05 DIAGNOSIS — J209 Acute bronchitis, unspecified: Secondary | ICD-10-CM

## 2013-03-05 DIAGNOSIS — T380X5A Adverse effect of glucocorticoids and synthetic analogues, initial encounter: Secondary | ICD-10-CM | POA: Diagnosis present

## 2013-03-05 DIAGNOSIS — N946 Dysmenorrhea, unspecified: Secondary | ICD-10-CM

## 2013-03-05 DIAGNOSIS — I10 Essential (primary) hypertension: Secondary | ICD-10-CM | POA: Diagnosis present

## 2013-03-05 DIAGNOSIS — J45901 Unspecified asthma with (acute) exacerbation: Principal | ICD-10-CM

## 2013-03-05 LAB — BASIC METABOLIC PANEL
CO2: 25 mEq/L (ref 19–32)
Chloride: 104 mEq/L (ref 96–112)
GFR calc Af Amer: >90 mL/min (ref >90)
Potassium: 3 mEq/L — ABNORMAL LOW (ref 3.5–5.1)
Sodium: 139 mEq/L (ref 135–145)

## 2013-03-05 LAB — CBC
HCT: 33.7 % — ABNORMAL LOW (ref 36.0–46.0)
MCV: 83.6 fL (ref 78.0–100.0)
RBC: 4.03 MIL/uL (ref 3.87–5.11)
WBC: 4.9 10*3/uL (ref 4.0–10.5)

## 2013-03-05 MED ORDER — ALBUTEROL SULFATE (5 MG/ML) 0.5% IN NEBU
2.5000 mg | INHALATION_SOLUTION | Freq: Four times a day (QID) | RESPIRATORY_TRACT | Status: DC
  Administered 2013-03-06 (×3): 2.5 mg via RESPIRATORY_TRACT
  Filled 2013-03-05 (×3): qty 0.5

## 2013-03-05 MED ORDER — ACETAMINOPHEN 325 MG PO TABS
650.0000 mg | ORAL_TABLET | Freq: Four times a day (QID) | ORAL | Status: DC | PRN
  Administered 2013-03-06 – 2013-03-10 (×2): 650 mg via ORAL
  Filled 2013-03-05 (×2): qty 2

## 2013-03-05 MED ORDER — POTASSIUM CHLORIDE CRYS ER 20 MEQ PO TBCR
40.0000 meq | EXTENDED_RELEASE_TABLET | Freq: Once | ORAL | Status: AC
  Administered 2013-03-05: 40 meq via ORAL
  Filled 2013-03-05: qty 2

## 2013-03-05 MED ORDER — ONDANSETRON HCL 4 MG/2ML IJ SOLN: 4.0000 mg | Freq: Four times a day (QID) | INTRAMUSCULAR | Status: DC | PRN

## 2013-03-05 MED ORDER — IPRATROPIUM BROMIDE 0.02 % IN SOLN
0.5000 mg | Freq: Once | RESPIRATORY_TRACT | Status: AC
  Administered 2013-03-05: 0.5 mg via RESPIRATORY_TRACT
  Filled 2013-03-05: qty 2.5

## 2013-03-05 MED ORDER — SODIUM CHLORIDE 0.9 % IJ SOLN
3.0000 mL | Freq: Two times a day (BID) | INTRAMUSCULAR | Status: DC
  Administered 2013-03-06 – 2013-03-11 (×6): 3 mL via INTRAVENOUS

## 2013-03-05 MED ORDER — ACETAMINOPHEN 650 MG RE SUPP: 650.0000 mg | Freq: Four times a day (QID) | RECTAL | Status: DC | PRN

## 2013-03-05 MED ORDER — SODIUM CHLORIDE 0.9 % IJ SOLN
3.0000 mL | Freq: Two times a day (BID) | INTRAMUSCULAR | Status: DC
  Administered 2013-03-06 – 2013-03-13 (×10): 3 mL via INTRAVENOUS

## 2013-03-05 MED ORDER — ONDANSETRON HCL 4 MG PO TABS: 4.0000 mg | ORAL_TABLET | Freq: Four times a day (QID) | ORAL | Status: DC | PRN

## 2013-03-05 MED ORDER — PREDNISONE 20 MG PO TABS
60.0000 mg | ORAL_TABLET | Freq: Once | ORAL | Status: AC
  Administered 2013-03-05: 60 mg via ORAL
  Filled 2013-03-05: qty 3

## 2013-03-05 MED ORDER — ALBUTEROL (5 MG/ML) CONTINUOUS INHALATION SOLN
15.0000 mg/h | INHALATION_SOLUTION | Freq: Once | RESPIRATORY_TRACT | Status: AC
  Administered 2013-03-05: 15 mg/h via RESPIRATORY_TRACT
  Filled 2013-03-05: qty 20

## 2013-03-05 MED ORDER — DEXTROSE 5 % IV SOLN
500.0000 mg | Freq: Every day | INTRAVENOUS | Status: DC
  Administered 2013-03-06 (×2): 500 mg via INTRAVENOUS
  Filled 2013-03-05 (×3): qty 500

## 2013-03-05 MED ORDER — POTASSIUM CHLORIDE CRYS ER 20 MEQ PO TBCR
20.0000 meq | EXTENDED_RELEASE_TABLET | Freq: Once | ORAL | Status: AC
  Administered 2013-03-05: 20 meq via ORAL
  Filled 2013-03-05: qty 1

## 2013-03-05 MED ORDER — BUDESONIDE 0.25 MG/2ML IN SUSP
0.2500 mg | Freq: Two times a day (BID) | RESPIRATORY_TRACT | Status: DC
  Administered 2013-03-06 – 2013-03-15 (×19): 0.25 mg via RESPIRATORY_TRACT
  Filled 2013-03-05 (×22): qty 2

## 2013-03-05 MED ORDER — METHYLPREDNISOLONE SODIUM SUCC 40 MG IJ SOLR
40.0000 mg | Freq: Every day | INTRAMUSCULAR | Status: DC
  Administered 2013-03-06 (×2): 40 mg via INTRAVENOUS
  Filled 2013-03-05 (×3): qty 1

## 2013-03-05 MED ORDER — ALBUTEROL SULFATE (5 MG/ML) 0.5% IN NEBU
5.0000 mg | INHALATION_SOLUTION | Freq: Once | RESPIRATORY_TRACT | Status: AC
  Administered 2013-03-05: 5 mg via RESPIRATORY_TRACT

## 2013-03-05 MED ORDER — ENOXAPARIN SODIUM 40 MG/0.4ML ~~LOC~~ SOLN
40.0000 mg | Freq: Every day | SUBCUTANEOUS | Status: DC
  Administered 2013-03-06 – 2013-03-14 (×10): 40 mg via SUBCUTANEOUS
  Filled 2013-03-05 (×11): qty 0.4

## 2013-03-05 MED ORDER — ALBUTEROL SULFATE (5 MG/ML) 0.5% IN NEBU
2.5000 mg | INHALATION_SOLUTION | RESPIRATORY_TRACT | Status: DC | PRN
  Administered 2013-03-06 – 2013-03-12 (×5): 2.5 mg via RESPIRATORY_TRACT
  Filled 2013-03-05 (×5): qty 0.5

## 2013-03-05 MED ORDER — DM-GUAIFENESIN ER 30-600 MG PO TB12
1.0000 | ORAL_TABLET | Freq: Two times a day (BID) | ORAL | Status: DC
  Administered 2013-03-05 – 2013-03-06 (×2): 1 via ORAL
  Filled 2013-03-05 (×3): qty 1

## 2013-03-05 NOTE — H&P (Signed)
[facility] History and Physical  Malkie Wille Arata JWJ:[id number] DOB: 05/05/1971 DOA: 03/05/2013  Referring physician: ER physician. PCP: No PCP Per Patient   Chief Complaint: Shortness of breath.  HPI: LASHAUN James is a 42 y.o. female with known history of bronchial asthma has been experiencing shortness of breath last 2 days with productive cough. Since her shortness of breath was persistent despite taking multiple doses of albuterol patient came to the ER. In the ER patient was given nebulizers and patient was still wheezing and has been admitted for exacerbation. Patient denies any chest pain or fever chills. Presently patient is able to complete sentences. Denies any nausea vomiting abdominal pain diarrhea. Patient's last asthma exacerbation per patient was more than a year ago and patient denies smoking cigarettes or using NSAIDs.  Review of Systems: As presented in the history of presenting illness, rest negative.  Past Medical History  Diagnosis Date  . Asthma   . Seasonal allergies   . Arthritis    Past Surgical History  Procedure Laterality Date  . Cholecystectomy    . Cesarean section    . Tubal ligation     Social History:  reports that she has quit smoking. She has never used smokeless tobacco. She reports that she does not drink alcohol or use illicit drugs. Home. where does patient live-- Can do ADLs. Can patient participate in ADLs?  No Known Allergies  Family History  Problem Relation Age of Onset  . Hypertension Mother   . Diabetes Mother   . Cancer Mother       Prior to Admission medications   Medication Sig Start Date End Date Taking? Authorizing Provider  albuterol (PROVENTIL) (2.5 MG/3ML) 0.083% nebulizer solution Take 2.5 mg by nebulization every 6 (six) hours as needed. For shortness of breath   Yes Historical Provider, MD  ibuprofen (ADVIL,MOTRIN) 200 MG tablet Take 400 mg by mouth every 6 (six) hours as needed for pain.   Yes Historical  Provider, MD  albuterol (PROVENTIL HFA;VENTOLIN HFA) 108 (90 BASE) MCG/ACT inhaler Inhale 2 puffs into the lungs every 6 (six) hours as needed. For shortness of breath and wheezing    Historical Provider, MD   Physical Exam: Filed Vitals:   03/05/13 1836 03/05/13 1850 03/05/13 1925  BP: 160/97    Pulse: 92 84 82  Resp: 24 13 16   SpO2: 99%  98%     General:  Well-developed and nourished.  Eyes: Anicteric no pallor.  ENT: No discharge from ears eyes nose mouth.  Neck: No mass felt.  Cardiovascular: S1-S2 heard.  Respiratory: Mild expiratory wheeze no crepitations.  Abdomen: Soft nontender bowel sounds present.  Skin: No rash.  Musculoskeletal: No edema.  Psychiatric: Appears normal.  Neurologic: Alert awake oriented to time place and person. Moves all extremities.  Labs on Admission:  Basic Metabolic Panel:  Recent Labs Lab 03/05/13 1845  NA 139  K 3.0*  CL 104  CO2 25  GLUCOSE 98  BUN 6  CREATININE 0.66  CALCIUM 8.7   Liver Function Tests: No results found for this basename: AST, ALT, ALKPHOS, BILITOT, PROT, ALBUMIN,  in the last 168 hours No results found for this basename: LIPASE, AMYLASE,  in the last 168 hours No results found for this basename: AMMONIA,  in the last 168 hours CBC:  Recent Labs Lab 03/05/13 1845  WBC 4.9  HGB 10.9*  HCT 33.7*  MCV 83.6  PLT 206   Cardiac Enzymes: No results found for this  basename: CKTOTAL, CKMB, CKMBINDEX, TROPONINI,  in the last 168 hours  BNP (last 3 results) No results found for this basename: PROBNP,  in the last 8760 hours CBG: No results found for this basename: GLUCAP,  in the last 168 hours  Radiological Exams on Admission: Dg Chest 2 View (if Patient Has Fever And/or Copd)  03/05/2013   *RADIOLOGY REPORT*  Clinical Data: Asthma, shortness of breath  CHEST - 2 VIEW  Comparison:  10/13/2012  Findings:  The heart size and mediastinal contours are within normal limits.  Both lungs are clear.  The  visualized skeletal structures are unremarkable.  IMPRESSION: No active cardiopulmonary disease.   Original Report Authenticated By: Judie Petit. Miles Costain, M.D.     Assessment/Plan Principal Problem:   Acute asthma exacerbation   1. Asthma exacerbation - presently patient is able to complete sentences without difficulty and we will monitor in telemetry. Continue with nebulizers we'll add antibiotic Zithromax because patient has productive cough. IV steroids and Pulmicort. 2. Hypokalemia - replace and recheck. 3. Anemia - closely follow CBC and further workup as outpatient.    Code Status: Full code.  Family Communication: None.  Disposition Plan: Admit to inpatient.    Kaytlin Burklow N. [facility] Pager [phone].  If 7PM-7AM, please contact night-coverage [email or web address] Password [facility] 03/05/2013, 10:43 PM

## 2013-03-05 NOTE — ED Provider Notes (Signed)
CSN: [id number]     Arrival date & time 03/05/13  1828 History     First MD Initiated Contact with Patient 03/05/13 1839     Chief Complaint  Patient presents with  . Asthma   (Consider location/radiation/quality/duration/timing/severity/associated sxs/prior Treatment) HPI  Patient reports she has a history of asthma. She states her current episode flared up yesterday. She was at work and started getting short of breath and when she got home she felt hot and thought maybe she had a fever and took some Tylenol. She states last night she started using her nebulizer every 4 hours however it does not seem to be helping. She states she's had a cough with initial bleed yellow sputum and it is now clear. She states her chest feels tight. She has a sore throat, but no rhinorrhea, or diarrhea or nausea. She does state she has vomiting which is posttussive. She reports she was diagnosed as being prediabetic a few months ago. She states her last admission for asthma was about 2 years ago. She states she's normally placed on steroids. She relates her last time she was admitted she had to have IV insulin while she was on the steroids.  [facility]  Past Medical History  Diagnosis Date  . Asthma   . Seasonal allergies   . Arthritis    Past Surgical History  Procedure Laterality Date  . Cholecystectomy    . Cesarean section    . Tubal ligation     Family History  Problem Relation Age of Onset  . Hypertension Mother   . Diabetes Mother   . Cancer Mother    History  Substance Use Topics  . Smoking status: Former Games developer  . Smokeless tobacco: Never Used  . Alcohol Use: No  employed Lives at home  OB History   Grav Para Term Preterm Abortions TAB SAB Ect Mult Living   5 4 4  0 1 0 1 0 0 3     Review of Systems  All other systems reviewed and are negative.    Allergies  Review of patient's allergies indicates no known allergies.  Home Medications   Current Outpatient Rx   Name  Route  Sig  Dispense  Refill  . albuterol (PROVENTIL) (2.5 MG/3ML) 0.083% nebulizer solution   Nebulization   Take 2.5 mg by nebulization every 6 (six) hours as needed. For shortness of breath         . ibuprofen (ADVIL,MOTRIN) 200 MG tablet   Oral   Take 400 mg by mouth every 6 (six) hours as needed for pain.         Marland Kitchen albuterol (PROVENTIL HFA;VENTOLIN HFA) 108 (90 BASE) MCG/ACT inhaler   Inhalation   Inhale 2 puffs into the lungs every 6 (six) hours as needed. For shortness of breath and wheezing          BP 160/97  Pulse 82  Resp 16  SpO2 98%  LMP 02/15/2013  Vital signs normal   Physical Exam  Nursing note and vitals reviewed. Constitutional: She is oriented to person, place, and time. She appears well-developed and well-nourished.  Non-toxic appearance. She does not appear ill. No distress.  Examined after first nebulizer  HENT:  Head: Normocephalic and atraumatic.  Right Ear: External ear normal.  Left Ear: External ear normal.  Nose: Nose normal. No mucosal edema or rhinorrhea.  Mouth/Throat: Oropharynx is clear and moist and mucous membranes are normal. No dental abscesses or edematous.  Eyes: Conjunctivae  and EOM are normal. Pupils are equal, round, and reactive to light.  Neck: Normal range of motion and full passive range of motion without pain. Neck supple.  Cardiovascular: Normal rate, regular rhythm and normal heart sounds.  Exam reveals no gallop and no friction rub.   No murmur heard. Pulmonary/Chest: Effort normal. No respiratory distress. She has decreased breath sounds. She has no wheezes. She has no rhonchi. She has no rales. She exhibits no tenderness and no crepitus.  Coughing frequently  Abdominal: Soft. Normal appearance and bowel sounds are normal. She exhibits no distension. There is no tenderness. There is no rebound and no guarding.  Musculoskeletal: Normal range of motion. She exhibits no edema and no tenderness.  Moves all  extremities well.   Neurological: She is alert and oriented to person, place, and time. She has normal strength. No cranial nerve deficit.  Skin: Skin is warm, dry and intact. No rash noted. No erythema. No pallor.  Psychiatric: She has a normal mood and affect. Her speech is normal and behavior is normal. Her mood appears not anxious.    ED Course   Medications  dextromethorphan-guaiFENesin (MUCINEX DM) 30-600 MG per 12 hr tablet 1 tablet (1 tablet Oral Given 03/05/13 2226)  potassium chloride SA (K-DUR,KLOR-CON) CR tablet 20 mEq (not administered)  albuterol (PROVENTIL) (5 MG/ML) 0.5% nebulizer solution 5 mg (5 mg Nebulization Given 03/05/13 1838)  albuterol (PROVENTIL,VENTOLIN) solution continuous neb (15 mg/hr Nebulization Given 03/05/13 1925)  ipratropium (ATROVENT) nebulizer solution 0.5 mg (0.5 mg Nebulization Given 03/05/13 1925)  predniSONE (DELTASONE) tablet 60 mg (60 mg Oral Given 03/05/13 2021)  potassium chloride SA (K-DUR,KLOR-CON) CR tablet 40 mEq (40 mEq Oral Given 03/05/13 2034)     Procedures (including critical care time)  Recheck 20:20 patient just finished her continuous nebulizer. She is noted to have continuous coughing and appears to be tight. I listened to her she still has diminished breath sounds and she now has a few end expiratory wheezing. Her potassium which was low was supplemented with oral potassium.  Pt was ambulated and maintained her pulse ox at 98% on RA, however she continued to have a lot of coughing and appeared to be tight. She is willing to be admitted.    21:51 Dr Toniann Fail admit to tele, team 8  Results for orders placed during the hospital encounter of 03/05/13  BASIC METABOLIC PANEL      Result Value Range   Sodium 139  135 - 145 mEq/L   Potassium 3.0 (*) 3.5 - 5.1 mEq/L   Chloride 104  96 - 112 mEq/L   CO2 25  19 - 32 mEq/L   Glucose, Bld 98  70 - 99 mg/dL   BUN 6  6 - 23 mg/dL   Creatinine, Ser 4.78  0.50 - 1.10 mg/dL   Calcium 8.7  8.4 -  29.5 mg/dL   GFR calc non Af Amer >90  >90 mL/min   GFR calc Af Amer >90  >90 mL/min  CBC      Result Value Range   WBC 4.9  4.0 - 10.5 K/uL   RBC 4.03  3.87 - 5.11 MIL/uL   Hemoglobin 10.9 (*) 12.0 - 15.0 g/dL   HCT 62.1 (*) 30.8 - 65.7 %   MCV 83.6  78.0 - 100.0 fL   MCH 27.0  26.0 - 34.0 pg   MCHC 32.3  30.0 - 36.0 g/dL   RDW 84.6  96.2 - 95.2 %   Platelets 206  150 - 400 K/uL    Laboratory interpretation all normal except except hypokalemia, anemia  Dg Chest 2 View (if Patient Has Fever And/or Copd)  03/05/2013   *RADIOLOGY REPORT*  Clinical Data: Asthma, shortness of breath  CHEST - 2 VIEW  Comparison:  10/13/2012  Findings:  The heart size and mediastinal contours are within normal limits.  Both lungs are clear.  The visualized skeletal structures are unremarkable.  IMPRESSION: No active cardiopulmonary disease.   Original Report Authenticated By: Judie Petit. Shick, M.D.     1. Exacerbation of intermittent asthma   2. Hypokalemia   3. Bronchitis   4. Acute asthma exacerbation, unspecified asthma severity     Plan admission   Devoria Albe, MD, FACEP   MDM    Ward Givens, MD 03/05/13 2320

## 2013-03-05 NOTE — ED Notes (Signed)
Pt reports hx of asthma. SOB since yesterday. Has nebulizer machine which she has been using every 4 hours today. Lung sounds diminished, expiratory wheezes, upper lobe inspiratory wheezes.  Pt able to catch breath and speak in full sentences. 100% on room air. Pt coughing up white sputum. Albuterol inhaler started.

## 2013-03-06 ENCOUNTER — Encounter (HOSPITAL_COMMUNITY): Payer: Self-pay | Admitting: *Deleted

## 2013-03-06 DIAGNOSIS — N946 Dysmenorrhea, unspecified: Secondary | ICD-10-CM

## 2013-03-06 DIAGNOSIS — R030 Elevated blood-pressure reading, without diagnosis of hypertension: Secondary | ICD-10-CM

## 2013-03-06 DIAGNOSIS — E876 Hypokalemia: Secondary | ICD-10-CM

## 2013-03-06 DIAGNOSIS — J9600 Acute respiratory failure, unspecified whether with hypoxia or hypercapnia: Secondary | ICD-10-CM

## 2013-03-06 LAB — BASIC METABOLIC PANEL
CO2: 21 mEq/L (ref 19–32)
Calcium: 8.6 mg/dL (ref 8.4–10.5)
Creatinine, Ser: 0.57 mg/dL (ref 0.50–1.10)
GFR calc non Af Amer: >90 mL/min (ref >90)
Glucose, Bld: 151 mg/dL — ABNORMAL HIGH (ref 70–99)
Sodium: 136 mEq/L (ref 135–145)

## 2013-03-06 LAB — CBC
MCH: 26.7 pg (ref 26.0–34.0)
MCV: 83.7 fL (ref 78.0–100.0)
Platelets: 201 10*3/uL (ref 150–400)
RBC: 3.93 MIL/uL (ref 3.87–5.11)
RDW: 15.3 % (ref 11.5–15.5)
WBC: 5.4 10*3/uL (ref 4.0–10.5)

## 2013-03-06 LAB — PREGNANCY, URINE: Preg Test, Ur: NEGATIVE

## 2013-03-06 MED ORDER — HYDROCODONE-ACETAMINOPHEN 5-325 MG PO TABS
1.0000 | ORAL_TABLET | Freq: Four times a day (QID) | ORAL | Status: DC | PRN
  Administered 2013-03-06: 1 via ORAL
  Filled 2013-03-06: qty 1

## 2013-03-06 MED ORDER — ALBUTEROL SULFATE (5 MG/ML) 0.5% IN NEBU
2.5000 mg | INHALATION_SOLUTION | RESPIRATORY_TRACT | Status: DC
  Administered 2013-03-06 – 2013-03-12 (×32): 2.5 mg via RESPIRATORY_TRACT
  Filled 2013-03-06 (×33): qty 0.5

## 2013-03-06 MED ORDER — HYDROCODONE-ACETAMINOPHEN 5-325 MG PO TABS
1.0000 | ORAL_TABLET | ORAL | Status: DC | PRN
  Administered 2013-03-06 – 2013-03-14 (×13): 1 via ORAL
  Filled 2013-03-06 (×14): qty 1

## 2013-03-06 MED ORDER — HYDROCOD POLST-CHLORPHEN POLST 10-8 MG/5ML PO LQCR
5.0000 mL | Freq: Once | ORAL | Status: AC
  Administered 2013-03-06: 5 mL via ORAL
  Filled 2013-03-06: qty 5

## 2013-03-06 MED ORDER — BENZONATATE 100 MG PO CAPS
100.0000 mg | ORAL_CAPSULE | Freq: Two times a day (BID) | ORAL | Status: DC | PRN
  Administered 2013-03-06: 100 mg via ORAL
  Filled 2013-03-06: qty 1

## 2013-03-06 MED ORDER — HYDROCOD POLST-CHLORPHEN POLST 10-8 MG/5ML PO LQCR
5.0000 mL | Freq: Two times a day (BID) | ORAL | Status: DC | PRN
  Administered 2013-03-06 – 2013-03-14 (×13): 5 mL via ORAL
  Filled 2013-03-06 (×16): qty 5

## 2013-03-06 NOTE — Progress Notes (Addendum)
[facility] PROGRESS NOTE  JARRAH SEHER WUJ:[id number] DOB: Jun 25, 1971 DOA: 03/05/2013 PCP: No PCP Per Patient  Brief narrative: 42 year old female with past history of bronchial asthma who has been admitted to [facility] ED 03/05/2013 with progressively worsening shortness of breath for past 2 days with productive cough. There was no symptomatic relief with albuterol inhaler at home. Patient used rescue inhaler during the day and night every 4 hours consistently but no significant relief as mentioned previously.  In the ER patient was given nebulizers and patient was still wheezing.O2 saturation was 98% on room air. BP was 148/100.  CBC revealed hemoglobin of 10.9. BMP revealed potassium of 3. CXR did not reveal acute cardiopulmonary process.  Assessment and Plan:  Principal Problem: Acute moderate persistent asthma exacerbation - continue current regimen with albuterol nebulizer every 4 hours scheduled and every 2 hours PRN - continue pulmicort nebulizer every 12 hours scheduled - continue solumedrol 40 mg IV at bedtime - azithromycin for possible bronchitis  Active Problems: Hypokalemia - repleted in ED  - potassium now WNL Anemia - hemoglobin stable at 10.5 - no indications for transfusion Hypertension - admission but now at goal   Consultants:  None   Procedures/Studies: Dg Chest 2 View (if Patient Has Fever And/or Copd) 03/05/2013  IMPRESSION: No active cardiopulmonary disease.    Antibiotics:  Azithromycin 03/05/2013 -->  Code Status: Full Family Communication: Pt at bedside Disposition Plan: Home when medically stable  HPI/Subjective: No events overnight.   Objective: Filed Vitals:   03/06/13 0112 03/06/13 0411 03/06/13 0612 03/06/13 0812  BP:  159/90    Pulse: 93 88    Temp:  98.1 F (36.7 C)    TempSrc:  Oral    Resp: 22 16 20    Height:      Weight:      SpO2: 99% 99%  98%    Intake/Output Summary (Last 24 hours) at 03/06/13 1359 Last data filed at  03/06/13 0804  Gross per 24 hour  Intake   1940 ml  Output   1200 ml  Net    740 ml    Exam:   General:  Pt is alert, follows commands appropriately, not in acute distress  Cardiovascular: Regular rate and rhythm, S1/S2, no murmurs, no rubs, no gallops  Respiratory: Clear to auscultation bilaterally, no wheezing, no crackles, no rhonchi  Abdomen: Soft, non tender, non distended, bowel sounds present, no guarding  Extremities: No edema, pulses DP and PT palpable bilaterally  Neuro: Grossly nonfocal  Data Reviewed: Basic Metabolic Panel:  Recent Labs Lab 03/05/13 1845 03/06/13 0453  NA 139 136  K 3.0* 3.5  CL 104 104  CO2 25 21  GLUCOSE 98 151*  BUN 6 6  CREATININE 0.66 0.57  CALCIUM 8.7 8.6   Liver Function Tests: No results found for this basename: AST, ALT, ALKPHOS, BILITOT, PROT, ALBUMIN,  in the last 168 hours No results found for this basename: LIPASE, AMYLASE,  in the last 168 hours No results found for this basename: AMMONIA,  in the last 168 hours CBC:  Recent Labs Lab 03/05/13 1845 03/06/13 0453  WBC 4.9 5.4  HGB 10.9* 10.5*  HCT 33.7* 32.9*  MCV 83.6 83.7  PLT 206 201   Scheduled Meds: . albuterol  2.5 mg Nebulization Q6H  . azithromycin  500 mg Intravenous QHS  . budesonide (PULMICORT) nebulizer solution  0.25 mg Nebulization BID  . enoxaparin (LOVENOX)  40 mg Subcutaneous QHS  . methylPREDNISolone  40 mg Intravenous QHS      Debbora Presto, MD  [facility] Pager [phone]  If 7PM-7AM, please contact night-coverage [email or web address] Password [email or web address] 03/06/2013, 1:59 PM   LOS: 1 day

## 2013-03-06 NOTE — Care Management Note (Addendum)
    Page 1 of 2   03/13/2013     3:49:39 PM   CARE MANAGEMENT NOTE 03/13/2013  Patient:  ALEECE, LOYD   Account Number:  [id number]  Date Initiated:  03/06/2013  Documentation initiated by:  Lanier Clam  Subjective/Objective Assessment:   ADMITTED W/SOB.ASTHMA.     Action/Plan:   FROM HOME W/SPOUSE.[facility].   Anticipated DC Date:  03/13/2013   Anticipated DC Plan:  HOME/SELF CARE  In-house referral  Financial Counselor      DC Planning Services  CM consult  Medication Assistance  MATCH Program      Choice offered to / List presented to:  C-1 Patient   DME arranged  NEBULIZER MACHINE      DME agency  [facility]        Status of service:  In process, will continue to follow Medicare Important Message given?   (If response is "NO", the following Medicare IM given date fields will be blank) Date Medicare IM given:   Date Additional Medicare IM given:    Discharge Disposition:    Per UR Regulation:  Reviewed for med. necessity/level of care/duration of stay  If discussed at Long Length of Stay Meetings, dates discussed:   03/12/2013    Comments:  03/13/13 Tatyanna Cronk RN,BSN NCM [phone] [facility] HAS ALREADY BROUGHT NEB MACHINE INTO RM.QUALIFIES FOR MATCH PROGRAM.WILL NEED SCRIPTS.HOSPITAL F/U APPT ALREADY MADE SEE D/C FOLLOW UP SECTION.  03/12/13 Rhenda Oregon RN,BSN NCM [phone] WHEEZING,WEANING STEROIDS.MATCH PROGRAM ELIGIBLE FOR NEB INHALER & NEB SOLN  IF ORDERED.[facility] DEM REP FOLLOWING FOR NEB MACHINE IF ORDERED.[facility] FOR HOSPITAL F/U APPT,LEFT MY CALL BACK#.  03/09/13 Green Quincy RN,BSN NCM [phone] MATCH PROGRAM ELIGIBLE-ALBUTEROL NEBS,ALBUTEROL INHALER.IF NEB MACHINE NEEDED PLEASE ORDER.[facility] KRISTEN REP AWARE.PATIENT INFORMED OF POLICY:1X USE IN 12 MONTHS/$3 CO PAY/NO NARCOTICS/SELECT PHARMACIES.MD AWARE OF NOTE ON SCRIPT ADM DATE,& D/C DATE FOR PATIENT EMPOLYER PURPOSES.EXPLAINED TO PATIENT THAT DISABILITY IS A  LENGTHY PROCESS THAT TAKES PLACE WITH COMMUNITY PCP.PATIENT VOICED UNDERSTANDING.  03/06/13 Onesimo Lingard RN,BSN NCM [phone] ENCOURAGED TO CONTACT [facility] WHILE IN HOSPITAL FOR PCP F/U APPT.PROVIDED W/HEALTH INSURANCE WEBSITE.PATIENT SPOKE TO FINANCIAL COUNSELOR.PROVIDED W/[employer] WEBSITE.CONTINUE TO MONITOR IF MATCH PROGRAM MAY BE NEEDED DEPENDING ON MEDS @D /C.PATIENT STATES SHE CAN AFFORD $3 COPAY FOR EACH MED.

## 2013-03-07 LAB — BASIC METABOLIC PANEL
Calcium: 8.8 mg/dL (ref 8.4–10.5)
GFR calc Af Amer: >90 mL/min (ref >90)
GFR calc non Af Amer: >90 mL/min (ref >90)
Potassium: 4.1 mEq/L (ref 3.5–5.1)
Sodium: 136 mEq/L (ref 135–145)

## 2013-03-07 LAB — CBC
Hemoglobin: 10.2 g/dL — ABNORMAL LOW (ref 12.0–15.0)
MCH: 26.6 pg (ref 26.0–34.0)
MCHC: 31.6 g/dL (ref 30.0–36.0)
Platelets: 184 10*3/uL (ref 150–400)

## 2013-03-07 MED ORDER — HYDROCODONE-ACETAMINOPHEN 5-325 MG PO TABS: 1.0000 | ORAL_TABLET | ORAL | Status: DC | PRN

## 2013-03-07 MED ORDER — DEXTROSE 5 % IV SOLN
500.0000 mg | INTRAVENOUS | Status: DC
  Administered 2013-03-07 – 2013-03-11 (×5): 500 mg via INTRAVENOUS
  Filled 2013-03-07 (×6): qty 500

## 2013-03-07 MED ORDER — PREDNISONE 10 MG PO TABS: ORAL_TABLET | ORAL | Status: DC

## 2013-03-07 MED ORDER — ALBUTEROL SULFATE (2.5 MG/3ML) 0.083% IN NEBU: 2.5000 mg | INHALATION_SOLUTION | Freq: Four times a day (QID) | RESPIRATORY_TRACT | Status: DC | PRN

## 2013-03-07 MED ORDER — METHYLPREDNISOLONE SODIUM SUCC 40 MG IJ SOLR
40.0000 mg | Freq: Four times a day (QID) | INTRAMUSCULAR | Status: DC
  Administered 2013-03-07 – 2013-03-10 (×12): 40 mg via INTRAVENOUS
  Filled 2013-03-07 (×16): qty 1

## 2013-03-07 MED ORDER — ALBUTEROL SULFATE HFA 108 (90 BASE) MCG/ACT IN AERS: 2.0000 | INHALATION_SPRAY | Freq: Four times a day (QID) | RESPIRATORY_TRACT | Status: DC | PRN

## 2013-03-07 MED ORDER — DM-GUAIFENESIN ER 30-600 MG PO TB12
1.0000 | ORAL_TABLET | Freq: Two times a day (BID) | ORAL | Status: DC
  Administered 2013-03-07 – 2013-03-15 (×17): 1 via ORAL
  Filled 2013-03-07 (×18): qty 1

## 2013-03-07 MED ORDER — HYDROCOD POLST-CHLORPHEN POLST 10-8 MG/5ML PO LQCR: 5.0000 mL | Freq: Two times a day (BID) | ORAL | Status: DC | PRN

## 2013-03-07 NOTE — Progress Notes (Signed)
Patient ID: Wendy James, female   DOB: 18-Nov-1970, 42 y.o.   MRN: [id number] [facility] PRO[facility]ESS [facility]  Wendy James ION:[id number] DOB: 04/03/1971 DOA: 03/05/2013 PCP: No PCP Per Patient  Brief narrative:  42 year old female with past history of bronchial asthma who has been admitted to [facility] ED 03/05/2013 with progressively worsening shortness of breath for past 2 days with productive cough. There was no symptomatic relief with albuterol inhaler at home. Patient used rescue inhaler during the day and night every 4 hours consistently but no significant relief as mentioned previously.   In the ER patient was given nebulizers and patient was still wheezing.O2 saturation was 98% on room air. BP was 148/100. CBC revealed hemoglobin of 10.9. BMP revealed potassium of 3. CXR did not reveal acute cardiopulmonary process.   Assessment and Plan:  Principal Problem:  Acute moderate persistent asthma exacerbation  - continue current regimen with albuterol nebulizer every 4 hours scheduled and every 2 hours PRN  - continue pulmicort nebulizer every 12 hours scheduled  - continue solumedrol 40 mg IV but increase to every 6 hours as pt is still wheezing  - azithromycin day #3 Active Problems:  Hypokalemia  - repleted in ED  - potassium now WNL  Anemia  - hemoglobin stable at baseline - CBC in AM  - no indications for transfusion  Hypertension  - reasonable inpatient control   Consultants:  None  Procedures/Studies:  Dg Chest 2 View (if Patient Has Fever And/or Copd) 03/05/2013 IMPRESSION: No active cardiopulmonary disease.  Antibiotics:  Azithromycin 03/05/2013 -->  Code Status: Full  Family Communication: Pt at bedside  Disposition Plan: Home when medically stable  HPI/Subjective: No events overnight.   Objective: Filed Vitals:   03/07/13 0308 03/07/13 0509 03/07/13 0631 03/07/13 0830  BP:  136/84    Pulse:  62    Temp:  98.6 F (37 C)    TempSrc:  Oral    Resp:  19     Height:      Weight:      SpO2: 98% 97% 95% 98%    Intake/Output Summary (Last 24 hours) at 03/07/13 1113 Last data filed at 03/07/13 0900  Gross per 24 hour  Intake   1840 ml  Output      0 ml  Net   1840 ml    Exam:   General:  Pt is alert, follows commands appropriately, not in acute distress  Cardiovascular: Regular rate and rhythm, S1/S2, no murmurs, no rubs, no gallops  Respiratory: Clear to auscultation bilaterally, expiratory wheezing with diminished breath sounds at bases   Abdomen: Soft, non tender, non distended, bowel sounds present, no guarding  Extremities: No edema, pulses DP and PT palpable bilaterally  Neuro: Grossly nonfocal  Data Reviewed: Basic Metabolic Panel:  Recent Labs Lab 03/05/13 1845 03/06/13 0453 03/07/13 0550  NA 139 136 136  K 3.0* 3.5 4.1  CL 104 104 105  CO2 25 21 24   GLUCOSE 98 151* 137*  BUN 6 6 9   CREATININE 0.66 0.57 0.58  CALCIUM 8.7 8.6 8.8   Liver Function Tests: No results found for this basename: AST, ALT, ALKPHOS, BILITOT, PROT, ALBUMIN,  in the last 168 hours No results found for this basename: LIPASE, AMYLASE,  in the last 168 hours No results found for this basename: AMMONIA,  in the last 168 hours CBC:  Recent Labs Lab 03/05/13 1845 03/06/13 0453 03/07/13 0550  WBC 4.9 5.4 8.3  HGB 10.9*  10.5* 10.2*  HCT 33.7* 32.9* 32.3*  MCV 83.6 83.7 84.3  PLT 206 201 184   Cardiac Enzymes: No results found for this basename: CKTOTAL, CKMB, CKMBINDEX, TROPONINI,  in the last 168 hours BNP: No components found with this basename: POCBNP,  CBG: No results found for this basename: GLUCAP,  in the last 168 hours  No results found for this or any previous visit (from the past 240 hour(s)).   Scheduled Meds: . albuterol  2.5 mg Nebulization Q4H  . azithromycin  500 mg Intravenous Q24H  . budesonide (PULMICORT) nebulizer solution  0.25 mg Nebulization BID  . dextromethorphan-guaiFENesin  1 tablet Oral BID  .  enoxaparin (LOVENOX) injection  40 mg Subcutaneous QHS  . methylPREDNISolone (SOLU-MEDROL) injection  40 mg Intravenous Q6H  . sodium chloride  3 mL Intravenous Q12H  . sodium chloride  3 mL Intravenous Q12H   Continuous Infusions:    Wendy Presto, MD  [facility] Pager [phone]  If 7PM-7AM, please contact night-coverage [email or web address] Password [email or web address] 03/07/2013, 11:13 AM   LOS: 2 days

## 2013-03-07 NOTE — Progress Notes (Addendum)
Pt in and out of [place] while she had visitors at 2200. Pt asymptomatic and VS stable. After visitors left she returned to NSR with occasional PVC's. Will continue to monitor.   Earnest Conroy. Clelia Croft, RN

## 2013-03-08 LAB — BASIC METABOLIC PANEL
BUN: 9 mg/dL (ref 6–23)
CO2: 24 mEq/L (ref 19–32)
Chloride: 103 mEq/L (ref 96–112)
GFR calc non Af Amer: >90 mL/min (ref >90)
Glucose, Bld: 148 mg/dL — ABNORMAL HIGH (ref 70–99)
Potassium: 3.7 mEq/L (ref 3.5–5.1)

## 2013-03-08 LAB — CBC
HCT: 32.5 % — ABNORMAL LOW (ref 36.0–46.0)
Hemoglobin: 10.4 g/dL — ABNORMAL LOW (ref 12.0–15.0)
MCHC: 32 g/dL (ref 30.0–36.0)

## 2013-03-08 MED ORDER — POLYETHYLENE GLYCOL 3350 17 G PO PACK
17.0000 g | PACK | Freq: Every day | ORAL | Status: DC
  Administered 2013-03-08 – 2013-03-15 (×8): 17 g via ORAL
  Filled 2013-03-08 (×8): qty 1

## 2013-03-08 NOTE — Progress Notes (Signed)
Patient ID: Wendy James, female   DOB: 1970/10/24, 42 y.o.   MRN: [id number] [facility] PROGRESS NOTE  Wendy James WUJ:[id number] DOB: Mar 03, 1971 DOA: 03/05/2013 PCP: No PCP Per Patient  Brief narrative:  42 year old female with past history of bronchial asthma who has been admitted to [facility] ED 03/05/2013 with progressively worsening shortness of breath for past 2 days with productive cough. There was no symptomatic relief with albuterol inhaler at home. Patient used rescue inhaler during the day and night every 4 hours consistently but no significant relief as mentioned previously.   In the ER patient was given nebulizers and patient was still wheezing.O2 saturation was 98% on room air. BP was 148/100. CBC revealed hemoglobin of 10.9. BMP revealed potassium of 3. CXR did not reveal acute cardiopulmonary process.   Assessment and Plan:  Principal Problem:  Acute moderate persistent asthma exacerbation  - continue current regimen with albuterol nebulizer every 4 hours scheduled and every 2 hours PRN  - continue pulmicort nebulizer every 12 hours scheduled  - continue solumedrol 40 mg IV but increase to every 6 hours as pt is still wheezing  - azithromycin day #4 Active Problems:  Hypokalemia  - repleted in ED  - potassium now WNL  Anemia  - hemoglobin stable at baseline  - CBC in AM  - no indications for transfusion  Hypertension  - reasonable inpatient control   Consultants:  None  Procedures/Studies:  Dg Chest 2 View (if Patient Has Fever And/or Copd) 03/05/2013 IMPRESSION: No active cardiopulmonary disease.  Antibiotics:  Azithromycin 03/05/2013 -->  Code Status: Full  Family Communication: Pt at bedside  Disposition Plan: Home when medically stable    HPI/Subjective: No events overnight.   Objective: Filed Vitals:   03/07/13 2206 03/07/13 2354 03/08/13 0414 03/08/13 0500  BP: 148/87   144/89  Pulse: 74   71  Temp: 99.1 F (37.3 C)   98.5 F (36.9 C)   TempSrc: Oral   Oral  Resp: 20   18  Height:      Weight:      SpO2: 97% 98% 97% 99%    Intake/Output Summary (Last 24 hours) at 03/08/13 9562 Last data filed at 03/07/13 2224  Gross per 24 hour  Intake   1570 ml  Output      0 ml  Net   1570 ml    Exam:   General:  Pt is alert, follows commands appropriately, not in acute distress  Cardiovascular: Regular rate and rhythm, S1/S2, no murmurs, no rubs, no gallops  Respiratory: Course breath sounds, expiratory wheezing   Abdomen: Soft, non tender, non distended, bowel sounds present, no guarding  Extremities: No edema, pulses DP and PT palpable bilaterally  Neuro: Grossly nonfocal  Data Reviewed: Basic Metabolic Panel:  Recent Labs Lab 03/05/13 1845 03/06/13 0453 03/07/13 0550 03/08/13 0521  NA 139 136 136 136  K 3.0* 3.5 4.1 3.7  CL 104 104 105 103  CO2 25 21 24 24   GLUCOSE 98 151* 137* 148*  BUN 6 6 9 9   CREATININE 0.66 0.57 0.58 0.55  CALCIUM 8.7 8.6 8.8 8.9   CBC:  Recent Labs Lab 03/05/13 1845 03/06/13 0453 03/07/13 0550 03/08/13 0521  WBC 4.9 5.4 8.3 11.4*  HGB 10.9* 10.5* 10.2* 10.4*  HCT 33.7* 32.9* 32.3* 32.5*  MCV 83.6 83.7 84.3 84.2  PLT 206 201 184 182   Scheduled Meds: . albuterol  2.5 mg Nebulization Q4H  . azithromycin  500 mg Intravenous Q24H  . budesonide (PULMICORT) nebulizer solution  0.25 mg Nebulization BID  . dextromethorphan-guaiFENesin  1 tablet Oral BID  . enoxaparin (LOVENOX) injection  40 mg Subcutaneous QHS  . methylPREDNISolone (SOLU-MEDROL) injection  40 mg Intravenous Q6H  . sodium chloride  3 mL Intravenous Q12H  . sodium chloride  3 mL Intravenous Q12H   Continuous Infusions:    Debbora Presto, MD  [facility] Pager [phone]  If 7PM-7AM, please contact night-coverage [email or web address] Password [email or web address] 03/08/2013, 8:21 AM   LOS: 3 days

## 2013-03-09 LAB — CBC
HCT: 33 % — ABNORMAL LOW (ref 36.0–46.0)
Hemoglobin: 10.5 g/dL — ABNORMAL LOW (ref 12.0–15.0)
MCH: 26.9 pg (ref 26.0–34.0)
MCHC: 31.8 g/dL (ref 30.0–36.0)
MCV: 84.6 fL (ref 78.0–100.0)

## 2013-03-09 LAB — BASIC METABOLIC PANEL
BUN: 10 mg/dL (ref 6–23)
Calcium: 8.9 mg/dL (ref 8.4–10.5)
Creatinine, Ser: 0.59 mg/dL (ref 0.50–1.10)
GFR calc non Af Amer: >90 mL/min (ref >90)
Glucose, Bld: 138 mg/dL — ABNORMAL HIGH (ref 70–99)

## 2013-03-09 MED ORDER — HYDRALAZINE HCL 25 MG PO TABS
25.0000 mg | ORAL_TABLET | Freq: Three times a day (TID) | ORAL | Status: DC
  Administered 2013-03-09 – 2013-03-15 (×19): 25 mg via ORAL
  Filled 2013-03-09 (×22): qty 1

## 2013-03-09 NOTE — Progress Notes (Signed)
Patient ID: Wendy James, female   DOB: 1971-05-21, 42 y.o.   MRN: [id number] [facility] PROGRESS NOTE  Yarenis Cerino Urquiza [id number]:[id number] DOB: 08-29-1970 DOA: 03/05/2013 PCP: No PCP Per Patient  Brief narrative:  42 year old female with past history of bronchial asthma who has been admitted to [facility] ED 03/05/2013 with progressively worsening shortness of breath for past 2 days with productive cough. There was no symptomatic relief with albuterol inhaler at home. Patient used rescue inhaler during the day and night every 4 hours consistently but no significant relief as mentioned previously.   In the ER patient was given nebulizers and patient was still wheezing.O2 saturation was 98% on room air. BP was 148/100. CBC revealed hemoglobin of 10.9. BMP revealed potassium of 3. CXR did not reveal acute cardiopulmonary process.   Assessment and Plan:  Principal Problem:  Acute moderate persistent asthma exacerbation  - continue current regimen with albuterol nebulizer every 4 hours scheduled and every 2 hours PRN  - continue pulmicort nebulizer every 12 hours scheduled  - continue solumedrol 40 mg IV and plan to transition to PO in the afternoon  - azithromycin day #5 Active Problems:  Hypokalemia  - repleted in ED  - potassium now WNL  Anemia  - hemoglobin stable at baseline  - CBC in AM  - no indications for transfusion  Hypertension  - slightly above target range - will place on Hydralazine   Consultants:  None  Procedures/Studies:  Dg Chest 2 View (if Patient Has Fever And/or Copd) 03/05/2013 IMPRESSION: No active cardiopulmonary disease.  Antibiotics:  Azithromycin 03/05/2013 -->  Code Status: Full  Family Communication: Pt at bedside  Disposition Plan: Home when medically stable    HPI/Subjective: No events overnight.   Objective: Filed Vitals:   03/08/13 2233 03/09/13 0119 03/09/13 0420 03/09/13 0523  BP: 164/98   148/100  Pulse: 75   64  Temp:    98.5 F (36.9 C)   TempSrc:    Oral  Resp:    20  Height:      Weight:      SpO2:  98% 100% 97%    Intake/Output Summary (Last 24 hours) at 03/09/13 4696 Last data filed at 03/08/13 2220  Gross per 24 hour  Intake   1573 ml  Output      0 ml  Net   1573 ml    Exam:   General:  Pt is alert, follows commands appropriately, not in acute distress  Cardiovascular: Regular rate and rhythm, S1/S2, no murmurs, no rubs, no gallops  Respiratory: Clear to auscultation bilaterally, mild exp wheezing, diminished breath sounds at bases  Abdomen: Soft, non tender, non distended, bowel sounds present, no guarding  Extremities: No edema, pulses DP and PT palpable bilaterally  Neuro: Grossly nonfocal  Data Reviewed: Basic Metabolic Panel:  Recent Labs Lab 03/05/13 1845 03/06/13 0453 03/07/13 0550 03/08/13 0521 03/09/13 0436  NA 139 136 136 136 139  K 3.0* 3.5 4.1 3.7 4.0  CL 104 104 105 103 103  CO2 25 21 24 24 27   GLUCOSE 98 151* 137* 148* 138*  BUN 6 6 9 9 10   CREATININE 0.66 0.57 0.58 0.55 0.59  CALCIUM 8.7 8.6 8.8 8.9 8.9   CBC:  Recent Labs Lab 03/05/13 1845 03/06/13 0453 03/07/13 0550 03/08/13 0521 03/09/13 0436  WBC 4.9 5.4 8.3 11.4* 15.0*  HGB 10.9* 10.5* 10.2* 10.4* 10.5*  HCT 33.7* 32.9* 32.3* 32.5* 33.0*  MCV 83.6 83.7 84.3  84.2 84.6  PLT 206 201 184 182 181   Scheduled Meds: . albuterol  2.5 mg Nebulization Q4H  . azithromycin  500 mg Intravenous Q24H  . budesonide (PULMICORT) nebulizer solution  0.25 mg Nebulization BID  . dextromethorphan-guaiFENesin  1 tablet Oral BID  . enoxaparin (LOVENOX) injection  40 mg Subcutaneous QHS  . methylPREDNISolone (SOLU-MEDROL) injection  40 mg Intravenous Q6H  . polyethylene glycol  17 g Oral Daily  . sodium chloride  3 mL Intravenous Q12H  . sodium chloride  3 mL Intravenous Q12H   Continuous Infusions:    Debbora Presto, MD  [facility] Pager [phone]  If 7PM-7AM, please contact night-coverage [email or web address] Password  [email or web address] 03/09/2013, 6:24 AM   LOS: 4 days

## 2013-03-10 LAB — CBC
MCH: 27 pg (ref 26.0–34.0)
MCHC: 31.8 g/dL (ref 30.0–36.0)
MCV: 85.1 fL (ref 78.0–100.0)
Platelets: 192 10*3/uL (ref 150–400)

## 2013-03-10 LAB — BASIC METABOLIC PANEL
Calcium: 8.7 mg/dL (ref 8.4–10.5)
Creatinine, Ser: 0.6 mg/dL (ref 0.50–1.10)
GFR calc non Af Amer: >90 mL/min (ref >90)
Sodium: 137 mEq/L (ref 135–145)

## 2013-03-10 MED ORDER — PREDNISONE 50 MG PO TABS
50.0000 mg | ORAL_TABLET | Freq: Every day | ORAL | Status: DC
  Administered 2013-03-10 – 2013-03-11 (×2): 50 mg via ORAL
  Filled 2013-03-10 (×3): qty 1

## 2013-03-10 NOTE — Progress Notes (Signed)
Patient ID: Wendy James, female   DOB: June 30, 1971, 42 y.o.   MRN: [id number]  [facility] PROGRESS NOTE  Wendy James BJY:[id number] DOB: 02-Jan-1971 DOA: 03/05/2013 PCP: No PCP Per Patient  Brief narrative:  42 year old female with past history of bronchial asthma who has been admitted to [facility] ED 03/05/2013 with progressively worsening shortness of breath for past 2 days with productive cough. There was no symptomatic relief with albuterol inhaler at home. Patient used rescue inhaler during the day and night every 4 hours consistently but no significant relief as mentioned previously.   In the ER patient was given nebulizers and patient was still wheezing.O2 saturation was 98% on room air. BP was 148/100. CBC revealed hemoglobin of 10.9. BMP revealed potassium of 3. CXR did not reveal acute cardiopulmonary process.   Assessment and Plan:  Principal Problem:  Acute moderate persistent asthma exacerbation  - continue current regimen with albuterol nebulizer every 4 hours scheduled and every 2 hours PRN  - continue pulmicort nebulizer every 12 hours scheduled  - transition to Prednisone today  - azithromycin day #6 Active Problems:  Hypokalemia  - repleted in ED  - potassium now WNL  Anemia  - hemoglobin stable at baseline  - CBC in AM  - no indications for transfusion  Hypertension  - slightly above target range  - will place on Hydralazine   Consultants:  None  Procedures/Studies:  Dg Chest 2 View (if Patient Has Fever And/or Copd) 03/05/2013 IMPRESSION: No active cardiopulmonary disease.  Antibiotics:  Azithromycin 03/05/2013 -->  Code Status: Full  Family Communication: Pt at bedside  Disposition Plan: Home when medically stable    HPI/Subjective: No events overnight.   Objective: Filed Vitals:   03/09/13 0819 03/09/13 1315 03/09/13 2213 03/10/13 0540  BP:  162/79 134/114 156/96  Pulse:  91 78 76  Temp:  98.3 F (36.8 C) 98.6 F (37 C) 98.4 F (36.9 C)   TempSrc:  Oral Oral Oral  Resp:  20 20 16   Height:      Weight:      SpO2: 98% 98% 98% 97%   No intake or output data in the 24 hours ending 03/10/13 0865  Exam:   General:  Pt is alert, follows commands appropriately, not in acute distress  Cardiovascular: Regular rate and rhythm, S1/S2, no murmurs, no rubs, no gallops  Respiratory: Clear to auscultation bilaterally, mild exp wheezing, no crackles, no rhonchi  Abdomen: Soft, non tender, non distended, bowel sounds present, no guarding  Extremities: No edema, pulses DP and PT palpable bilaterally  Neuro: Grossly nonfocal  Data Reviewed: Basic Metabolic Panel:  Recent Labs Lab 03/06/13 0453 03/07/13 0550 03/08/13 0521 03/09/13 0436 03/10/13 0445  NA 136 136 136 139 137  K 3.5 4.1 3.7 4.0 4.1  CL 104 105 103 103 101  CO2 21 24 24 27 25   GLUCOSE 151* 137* 148* 138* 133*  BUN 6 9 9 10 10   CREATININE 0.57 0.58 0.55 0.59 0.60  CALCIUM 8.6 8.8 8.9 8.9 8.7   CBC:  Recent Labs Lab 03/06/13 0453 03/07/13 0550 03/08/13 0521 03/09/13 0436 03/10/13 0445  WBC 5.4 8.3 11.4* 15.0* 14.6*  HGB 10.5* 10.2* 10.4* 10.5* 10.7*  HCT 32.9* 32.3* 32.5* 33.0* 33.7*  MCV 83.7 84.3 84.2 84.6 85.1  PLT 201 184 182 181 192   Scheduled Meds: . albuterol  2.5 mg Nebulization Q4H  . azithromycin  500 mg Intravenous Q24H  . budesonide (PULMICORT) nebulizer solution  0.25 mg Nebulization BID  . dextromethorphan-guaiFENesin  1 tablet Oral BID  . enoxaparin (LOVENOX) injection  40 mg Subcutaneous QHS  . hydrALAZINE  25 mg Oral Q8H  . polyethylene glycol  17 g Oral Daily  . predniSONE  50 mg Oral Q breakfast  . sodium chloride  3 mL Intravenous Q12H  . sodium chloride  3 mL Intravenous Q12H   Continuous Infusions:    Debbora Presto, MD  [facility] Pager [phone]  If 7PM-7AM, please contact night-coverage [email or web address] Password [facility] 03/10/2013, 6:33 AM   LOS: 5 days

## 2013-03-11 ENCOUNTER — Inpatient Hospital Stay (HOSPITAL_COMMUNITY): Payer: Medicaid Other

## 2013-03-11 ENCOUNTER — Encounter (HOSPITAL_COMMUNITY): Payer: Self-pay | Admitting: Radiology

## 2013-03-11 LAB — CBC
MCH: 27 pg (ref 26.0–34.0)
Platelets: 208 10*3/uL (ref 150–400)
RBC: 4.03 MIL/uL (ref 3.87–5.11)
RDW: 16 % — ABNORMAL HIGH (ref 11.5–15.5)
WBC: 15.8 10*3/uL — ABNORMAL HIGH (ref 4.0–10.5)

## 2013-03-11 LAB — BASIC METABOLIC PANEL
CO2: 30 mEq/L (ref 19–32)
Calcium: 8.7 mg/dL (ref 8.4–10.5)
Chloride: 100 mEq/L (ref 96–112)
Creatinine, Ser: 0.67 mg/dL (ref 0.50–1.10)
GFR calc Af Amer: >90 mL/min (ref >90)
Sodium: 136 mEq/L (ref 135–145)

## 2013-03-11 MED ORDER — METHYLPREDNISOLONE SODIUM SUCC 125 MG IJ SOLR
80.0000 mg | Freq: Four times a day (QID) | INTRAMUSCULAR | Status: DC
  Administered 2013-03-11 – 2013-03-12 (×4): 80 mg via INTRAVENOUS
  Filled 2013-03-11 (×8): qty 1.28

## 2013-03-11 NOTE — Progress Notes (Signed)
Patient ID: Wendy James, female   DOB: 1970-11-22, 42 y.o.   MRN: [id number]  [facility] PROGRESS NOTE  Wendy James MVH:[id number] DOB: [birth year] DOA: 03/05/2013 PCP: No PCP Per Patient  Brief narrative:  42 year old female with past history of bronchial asthma who has been admitted to [facility] ED 03/05/2013 with progressively worsening shortness of breath for past 2 days with productive cough. There was no symptomatic relief with albuterol inhaler at home. Patient used rescue inhaler during the day and night every 4 hours consistently but no significant relief as mentioned previously.   In the ER patient was given nebulizers and patient was still wheezing.O2 saturation was 98% on room air. BP was 148/100. CBC revealed hemoglobin of 10.9. BMP revealed potassium of 3. CXR did not reveal acute cardiopulmonary process.   Please note that pt has been on Solumedrol since admission 03/05/2013 and her respiratory status improved and we transitioned her to Prednisone 8/12 but overnight her wheezing got worse and we have placed her back on Solumedrol 8/13.  Assessment and Plan:  Principal Problem:  Acute moderate persistent asthma exacerbation  - pt with more wheezing this AM, will have placed on Prednisone 8/12 but due to worsening wheezing will place back on Solumedrol - continue current regimen with albuterol nebulizer every 4 hours scheduled and every 2 hours PRN  - will obtain CT chest for clearer evaluation due to relatively slow recovery  - continue pulmicort nebulizer every 12 hours scheduled   - azithromycin day #7, will stop after today's dose Active Problems:  Hypokalemia  - repleted in ED  - potassium now WNL  Anemia  - hemoglobin stable at baseline  - CBC in AM  - no indications for transfusion  Hypertension  - slightly above target range  - will place on Hydralazine   Consultants:  None  Procedures/Studies:  Dg Chest 2 View 03/05/2013 IMPRESSION: No active  cardiopulmonary disease.  Antibiotics:  Azithromycin 03/05/2013 -->  Code Status: Full  Family Communication: Pt at bedside  Disposition Plan: Home when medically stable      HPI/Subjective: No events overnight.   Objective: Filed Vitals:   03/10/13 1337 03/10/13 2100 03/10/13 2212 03/11/13 0539  BP: 135/69 148/98 145/92 127/82  Pulse: 84  94 60  Temp: 98.2 F (36.8 C)  98.7 F (37.1 C) 98.8 F (37.1 C)  TempSrc: Oral  Oral Oral  Resp: 18  20 22   Height:      Weight:      SpO2: 100%  100% 98%    Intake/Output Summary (Last 24 hours) at 03/11/13 0654 Last data filed at 03/10/13 1900  Gross per 24 hour  Intake    240 ml  Output      0 ml  Net    240 ml    Exam:   General:  Pt is alert, follows commands appropriately, not in acute distress  Cardiovascular: Regular rate and rhythm, S1/S2, no murmurs, no rubs, no gallops  Respiratory: Clear to auscultation bilaterally, no wheezing, no crackles, no rhonchi  Abdomen: Soft, non tender, non distended, bowel sounds present, no guarding  Extremities: No edema, pulses DP and PT palpable bilaterally  Neuro: Grossly nonfocal  Data Reviewed: Basic Metabolic Panel:  Recent Labs Lab 03/07/13 0550 03/08/13 0521 03/09/13 0436 03/10/13 0445 03/11/13 0423  NA 136 136 139 137 136  K 4.1 3.7 4.0 4.1 3.7  CL 105 103 103 101 100  CO2 24 24 27 25  30  GLUCOSE 137* 148* 138* 133* 105*  BUN 9 9 10 10 18   CREATININE 0.58 0.55 0.59 0.60 0.67  CALCIUM 8.8 8.9 8.9 8.7 8.7   Liver Function Tests: No results found for this basename: AST, ALT, ALKPHOS, BILITOT, PROT, ALBUMIN,  in the last 168 hours No results found for this basename: LIPASE, AMYLASE,  in the last 168 hours No results found for this basename: AMMONIA,  in the last 168 hours CBC:  Recent Labs Lab 03/07/13 0550 03/08/13 0521 03/09/13 0436 03/10/13 0445 03/11/13 0423  WBC 8.3 11.4* 15.0* 14.6* 15.8*  HGB 10.2* 10.4* 10.5* 10.7* 10.9*  HCT 32.3* 32.5* 33.0*  33.7* 34.1*  MCV 84.3 84.2 84.6 85.1 84.6  PLT 184 182 181 192 208   Scheduled Meds: . albuterol  2.5 mg Nebulization Q4H  . azithromycin  500 mg Intravenous Q24H  . budesonide (PULMICORT) nebulizer solution  0.25 mg Nebulization BID  . dextromethorphan-guaiFENesin  1 tablet Oral BID  . enoxaparin (LOVENOX) injection  40 mg Subcutaneous QHS  . hydrALAZINE  25 mg Oral Q8H  . polyethylene glycol  17 g Oral Daily  . predniSONE  50 mg Oral Q breakfast  . sodium chloride  3 mL Intravenous Q12H  . sodium chloride  3 mL Intravenous Q12H   Continuous Infusions:    Debbora Presto, MD  [facility] Pager [phone]  If 7PM-7AM, please contact night-coverage [email or web address] Password [email or web address] 03/11/2013, 6:54 AM   LOS: 6 days

## 2013-03-12 DIAGNOSIS — J209 Acute bronchitis, unspecified: Secondary | ICD-10-CM

## 2013-03-12 LAB — CBC
MCV: 84.2 fL (ref 78.0–100.0)
Platelets: 217 10*3/uL (ref 150–400)
RBC: 4.04 MIL/uL (ref 3.87–5.11)
RDW: 15.9 % — ABNORMAL HIGH (ref 11.5–15.5)
WBC: 15.6 10*3/uL — ABNORMAL HIGH (ref 4.0–10.5)

## 2013-03-12 LAB — BASIC METABOLIC PANEL
CO2: 29 mEq/L (ref 19–32)
Chloride: 99 mEq/L (ref 96–112)
Creatinine, Ser: 0.54 mg/dL (ref 0.50–1.10)
GFR calc Af Amer: >90 mL/min (ref >90)
Potassium: 3.9 mEq/L (ref 3.5–5.1)
Sodium: 135 mEq/L (ref 135–145)

## 2013-03-12 MED ORDER — IPRATROPIUM BROMIDE 0.02 % IN SOLN
0.5000 mg | Freq: Four times a day (QID) | RESPIRATORY_TRACT | Status: DC
  Administered 2013-03-12 (×3): 0.5 mg via RESPIRATORY_TRACT
  Filled 2013-03-12 (×2): qty 2.5

## 2013-03-12 MED ORDER — METHYLPREDNISOLONE SODIUM SUCC 125 MG IJ SOLR
60.0000 mg | Freq: Four times a day (QID) | INTRAMUSCULAR | Status: DC
  Administered 2013-03-12 – 2013-03-13 (×4): 60 mg via INTRAVENOUS
  Filled 2013-03-12 (×8): qty 0.96

## 2013-03-12 MED ORDER — LEVOFLOXACIN 500 MG PO TABS
500.0000 mg | ORAL_TABLET | Freq: Every day | ORAL | Status: DC
  Administered 2013-03-12 – 2013-03-15 (×4): 500 mg via ORAL
  Filled 2013-03-12 (×6): qty 1

## 2013-03-12 MED ORDER — ALBUTEROL SULFATE (5 MG/ML) 0.5% IN NEBU
2.5000 mg | INHALATION_SOLUTION | Freq: Two times a day (BID) | RESPIRATORY_TRACT | Status: DC
  Administered 2013-03-13 – 2013-03-14 (×3): 2.5 mg via RESPIRATORY_TRACT
  Filled 2013-03-12 (×3): qty 0.5

## 2013-03-12 MED ORDER — ALBUTEROL SULFATE (5 MG/ML) 0.5% IN NEBU
2.5000 mg | INHALATION_SOLUTION | Freq: Four times a day (QID) | RESPIRATORY_TRACT | Status: DC
  Administered 2013-03-12 (×2): 2.5 mg via RESPIRATORY_TRACT
  Filled 2013-03-12: qty 0.5

## 2013-03-12 MED ORDER — ZOLPIDEM TARTRATE 5 MG PO TABS
5.0000 mg | ORAL_TABLET | Freq: Every evening | ORAL | Status: DC | PRN
  Administered 2013-03-12 – 2013-03-14 (×3): 5 mg via ORAL
  Filled 2013-03-12 (×3): qty 1

## 2013-03-12 NOTE — Progress Notes (Signed)
Patient respiratory assessment done. Patient scored low and  Treatments were changed per protocol. BBS clear throughout with no wheezes and no SOB with exertion. Patient if on Room air and has only a history of Asthma. Uses an Inhaler at home PRN. Pulmicort is new for her since admission. Would suggest Changing to a Maintenance Inhaler of Symbicort possibly or Pulmicort inhaler for home use. No other issues.

## 2013-03-12 NOTE — Progress Notes (Signed)
Patient ID: Wendy James, female   DOB: May 19, 1971, 42 y.o.   MRN: [id number]  [facility] PROGRESS NOTE  Wendy James NWG:[id number] DOB: [birth year] DOA: 03/05/2013 PCP: No PCP Per Patient  Brief narrative:  42 year old female with past history of bronchial asthma who has been admitted to [facility] ED 03/05/2013 with progressively worsening shortness of breath for past 2 days with productive cough. There was no symptomatic relief with albuterol inhaler at home. Patient used rescue inhaler during the day and night every 4 hours consistently but no significant relief as mentioned previously.   In the ER patient was given nebulizers and patient was still wheezing.O2 saturation was 98% on room air. BP was 148/100. CBC revealed hemoglobin of 10.9. BMP revealed potassium of 3. CXR did not reveal acute cardiopulmonary process.   Please note that pt has been on Solumedrol since admission 03/05/2013 and her respiratory status improved and we transitioned her to Prednisone 8/12 but overnight her wheezing got worse and we have placed her back on Solumedrol 8/13.  Assessment and Plan:  Principal Problem:  Acute moderate persistent asthma exacerbation  - failed prior attempts at weaning down steroids -cut down solumedrol to 60mg  Q6, albuterol, add atrovent nebs -flutter valve -needs [place] [facility]m FU  Hypokalemia  - repleted in ED  - potassium now WNL   Anemia  - hemoglobin stable at baseline  - CBC in AM   Hypertension  - BP on higher side, likely due to steroids   Consultants:  None  Procedures/Studies:  Dg Chest 2 View 03/05/2013 IMPRESSION: No active cardiopulmonary disease.  Antibiotics:  Azithromycin 03/05/2013 -->  Code Status: Full  Family Communication: none  at bedside  Disposition Plan: Home when medically stable      HPI/Subjective: No events overnight, doing a little better today, ambulated some   Objective: Filed Vitals:   03/11/13 2129 03/12/13 0606 03/12/13 0612  03/12/13 0944  BP:   140/82   Pulse:   78   Temp: 98.8 F (37.1 C)  97.4 F (36.3 C)   TempSrc: Oral  Oral   Resp: 20  18   Height:      Weight:      SpO2: 100% 100% 100% 97%    Intake/Output Summary (Last 24 hours) at 03/12/13 1400 Last data filed at 03/12/13 1100  Gross per 24 hour  Intake   1206 ml  Output      0 ml  Net   1206 ml    Exam:   General:  Pt is alert, awake, oriented x3  Cardiovascular: Regular rate and rhythm, S1/S2, no murmurs, no rubs, no gallops  Respiratory: poor air movement bilaterally, no wheezing, no crackles, no rhonchi  Abdomen: Soft, non tender, non distended, bowel sounds present, no guarding  Extremities: No edema, pulses DP and PT palpable bilaterally  Neuro: Grossly nonfocal  Data Reviewed: Basic Metabolic Panel:  Recent Labs Lab 03/08/13 0521 03/09/13 0436 03/10/13 0445 03/11/13 0423 03/12/13 0433  NA 136 139 137 136 135  K 3.7 4.0 4.1 3.7 3.9  CL 103 103 101 100 99  CO2 24 27 25 30 29   GLUCOSE 148* 138* 133* 105* 164*  BUN 9 10 10 18 10   CREATININE 0.55 0.59 0.60 0.67 0.54  CALCIUM 8.9 8.9 8.7 8.7 8.8   Liver Function Tests: No results found for this basename: AST, ALT, ALKPHOS, BILITOT, PROT, ALBUMIN,  in the last 168 hours No results found for this basename: LIPASE, AMYLASE,  in the last 168 hours No results found for this basename: AMMONIA,  in the last 168 hours CBC:  Recent Labs Lab 03/08/13 0521 03/09/13 0436 03/10/13 0445 03/11/13 0423 03/12/13 0433  WBC 11.4* 15.0* 14.6* 15.8* 15.6*  HGB 10.4* 10.5* 10.7* 10.9* 11.0*  HCT 32.5* 33.0* 33.7* 34.1* 34.0*  MCV 84.2 84.6 85.1 84.6 84.2  PLT 182 181 192 208 217   Scheduled Meds: . albuterol  2.5 mg Nebulization Q6H  . budesonide (PULMICORT) nebulizer solution  0.25 mg Nebulization BID  . dextromethorphan-guaiFENesin  1 tablet Oral BID  . enoxaparin (LOVENOX) injection  40 mg Subcutaneous QHS  . hydrALAZINE  25 mg Oral Q8H  . ipratropium  0.5 mg  Nebulization Q6H  . levofloxacin  500 mg Oral Daily  . methylPREDNISolone (SOLU-MEDROL) injection  60 mg Intravenous Q6H  . polyethylene glycol  17 g Oral Daily  . sodium chloride  3 mL Intravenous Q12H   Continuous Infusions:    Zannie Cove, MD  [facility] Pager [phone]  If 7PM-7AM, please contact night-coverage [email or web address] Password [email or web address] 03/12/2013, 2:00 PM   LOS: 7 days

## 2013-03-12 NOTE — Progress Notes (Signed)
[facility]  [facility] is providing the following services: Nebulizer  If patient discharges after hours, please call [phone].   Renard Hamper 03/12/2013, 11:34 AM

## 2013-03-13 MED ORDER — METHYLPREDNISOLONE SODIUM SUCC 40 MG IJ SOLR
40.0000 mg | Freq: Four times a day (QID) | INTRAMUSCULAR | Status: DC
  Administered 2013-03-13: 40 mg via INTRAVENOUS
  Filled 2013-03-13 (×4): qty 1

## 2013-03-13 MED ORDER — PREDNISONE 50 MG PO TABS
50.0000 mg | ORAL_TABLET | Freq: Every day | ORAL | Status: DC
  Administered 2013-03-13: 50 mg via ORAL
  Filled 2013-03-13 (×2): qty 1

## 2013-03-13 NOTE — Progress Notes (Signed)
Patient ID: Wendy James, female   DOB: 1970-10-15, 42 y.o.   MRN: [id number]  [facility] PROGRESS NOTE  Mel Tadros Bright WUJ:[id number] DOB: 1971/06/20 DOA: 03/05/2013 PCP: No PCP Per Patient  Brief narrative:  42 year old female with past history of bronchial asthma who has been admitted to [facility] ED 03/05/2013 with progressively worsening shortness of breath for past 2 days with productive cough. There was no symptomatic relief with albuterol inhaler at home. Patient used rescue inhaler during the day and night every 4 hours consistently but no significant relief as mentioned previously.   In the ER patient was given nebulizers and patient was still wheezing.O2 saturation was 98% on room air. BP was 148/100. CBC revealed hemoglobin of 10.9. BMP revealed potassium of 3. CXR did not reveal acute cardiopulmonary process.   Please note that pt has been on Solumedrol since admission 03/05/2013 and her respiratory status improved and we transitioned her to Prednisone 8/12 but overnight her wheezing got worse and we have placed her back on Solumedrol 8/13.  Assessment and Plan:  Principal Problem:  Acute moderate persistent asthma exacerbation  - failed prior attempts at weaning down steroids -cut down solumedrol to 40mg  Q6, albuterol, add atrovent nebs -flutter valve -needs [place] [facility] FU  Hypokalemia  - repleted in ED  - potassium now WNL   Anemia  - hemoglobin stable at baseline  - CBC in AM   Hypertension  - BP on higher side, likely due to steroids   Consultants:  None  Procedures/Studies:  Dg Chest 2 View 03/05/2013 IMPRESSION: No active cardiopulmonary disease.  Antibiotics:  Azithromycin 03/05/2013 -->  Code Status: Full  Family Communication: none  at bedside  Disposition Plan: Home when medically stable      HPI/Subjective: No events overnight, doing a little better today, ambulated some   Objective: Filed Vitals:   03/12/13 2126 03/13/13 0539 03/13/13 0551  03/13/13 1230  BP: 142/78 177/89 148/86 153/90  Pulse: 82 69  74  Temp: 99.8 F (37.7 C) 98.4 F (36.9 C)  98 F (36.7 C)  TempSrc: Oral Oral  Oral  Resp: 18 18  16   Height:      Weight:      SpO2: 99% 97%  99%    Intake/Output Summary (Last 24 hours) at 03/13/13 1413 Last data filed at 03/13/13 0932  Gross per 24 hour  Intake    840 ml  Output      0 ml  Net    840 ml    Exam:   General:  Pt is alert, awake, oriented x3  Cardiovascular: Regular rate and rhythm, S1/S2, no murmurs, no rubs, no gallops  Respiratory: improved air movement bilaterally, no wheezing, no crackles, no rhonchi  Abdomen: Soft, non tender, non distended, bowel sounds present, no guarding  Extremities: No edema, pulses DP and PT palpable bilaterally  Neuro: Grossly nonfocal  Data Reviewed: Basic Metabolic Panel:  Recent Labs Lab 03/08/13 0521 03/09/13 0436 03/10/13 0445 03/11/13 0423 03/12/13 0433  NA 136 139 137 136 135  K 3.7 4.0 4.1 3.7 3.9  CL 103 103 101 100 99  CO2 24 27 25 30 29   GLUCOSE 148* 138* 133* 105* 164*  BUN 9 10 10 18 10   CREATININE 0.55 0.59 0.60 0.67 0.54  CALCIUM 8.9 8.9 8.7 8.7 8.8   Liver Function Tests: No results found for this basename: AST, ALT, ALKPHOS, BILITOT, PROT, ALBUMIN,  in the last 168 hours No results found  for this basename: LIPASE, AMYLASE,  in the last 168 hours No results found for this basename: AMMONIA,  in the last 168 hours CBC:  Recent Labs Lab 03/08/13 0521 03/09/13 0436 03/10/13 0445 03/11/13 0423 03/12/13 0433  WBC 11.4* 15.0* 14.6* 15.8* 15.6*  HGB 10.4* 10.5* 10.7* 10.9* 11.0*  HCT 32.5* 33.0* 33.7* 34.1* 34.0*  MCV 84.2 84.6 85.1 84.6 84.2  PLT 182 181 192 208 217   Scheduled Meds: . albuterol  2.5 mg Nebulization BID  . budesonide (PULMICORT) nebulizer solution  0.25 mg Nebulization BID  . dextromethorphan-guaiFENesin  1 tablet Oral BID  . enoxaparin (LOVENOX) injection  40 mg Subcutaneous QHS  . hydrALAZINE  25 mg  Oral Q8H  . levofloxacin  500 mg Oral Daily  . methylPREDNISolone (SOLU-MEDROL) injection  40 mg Intravenous Q6H  . polyethylene glycol  17 g Oral Daily  . sodium chloride  3 mL Intravenous Q12H   Continuous Infusions:    Zannie Cove, MD  [facility] Pager [phone]  If 7PM-7AM, please contact night-coverage [email or web address] Password [email or web address] 03/13/2013, 2:13 PM   LOS: 8 days

## 2013-03-14 DIAGNOSIS — J40 Bronchitis, not specified as acute or chronic: Secondary | ICD-10-CM

## 2013-03-14 MED ORDER — PREDNISONE 50 MG PO TABS
60.0000 mg | ORAL_TABLET | Freq: Every day | ORAL | Status: DC
  Filled 2013-03-14: qty 1

## 2013-03-14 MED ORDER — ALBUTEROL SULFATE (5 MG/ML) 0.5% IN NEBU
2.5000 mg | INHALATION_SOLUTION | Freq: Four times a day (QID) | RESPIRATORY_TRACT | Status: DC
  Administered 2013-03-14 – 2013-03-15 (×4): 2.5 mg via RESPIRATORY_TRACT
  Filled 2013-03-14 (×4): qty 0.5

## 2013-03-14 MED ORDER — PREDNISONE 10 MG PO TABS
60.0000 mg | ORAL_TABLET | Freq: Every day | ORAL | Status: DC
  Administered 2013-03-14 – 2013-03-15 (×2): 60 mg via ORAL
  Filled 2013-03-14 (×3): qty 1

## 2013-03-14 MED ORDER — BUTALBITAL-APAP-CAFFEINE 50-325-40 MG PO TABS
1.0000 | ORAL_TABLET | Freq: Four times a day (QID) | ORAL | Status: DC | PRN
  Administered 2013-03-14: 1 via ORAL
  Filled 2013-03-14: qty 1

## 2013-03-15 DIAGNOSIS — J45909 Unspecified asthma, uncomplicated: Secondary | ICD-10-CM

## 2013-03-15 MED ORDER — HYDROCHLOROTHIAZIDE 25 MG PO TABS: 25.0000 mg | ORAL_TABLET | Freq: Every day | ORAL | Status: DC

## 2013-03-15 MED ORDER — ALBUTEROL SULFATE (2.5 MG/3ML) 0.083% IN NEBU: 2.5000 mg | INHALATION_SOLUTION | Freq: Four times a day (QID) | RESPIRATORY_TRACT | Status: DC | PRN

## 2013-03-15 MED ORDER — ALBUTEROL SULFATE HFA 108 (90 BASE) MCG/ACT IN AERS: 2.0000 | INHALATION_SPRAY | RESPIRATORY_TRACT | Status: DC | PRN

## 2013-03-15 MED ORDER — ALBUTEROL SULFATE (5 MG/ML) 0.5% IN NEBU: 2.5000 mg | INHALATION_SOLUTION | RESPIRATORY_TRACT | Status: DC | PRN

## 2013-03-15 MED ORDER — PREDNISONE 10 MG PO TABS: ORAL_TABLET | ORAL | Status: DC

## 2013-03-15 MED ORDER — BUDESONIDE-FORMOTEROL FUMARATE 80-4.5 MCG/ACT IN AERO: 2.0000 | INHALATION_SPRAY | Freq: Two times a day (BID) | RESPIRATORY_TRACT | Status: DC

## 2013-03-15 MED ORDER — UNABLE TO FIND: Status: DC

## 2013-03-15 MED ORDER — TIOTROPIUM BROMIDE MONOHYDRATE 18 MCG IN CAPS: 18.0000 ug | ORAL_CAPSULE | Freq: Every day | RESPIRATORY_TRACT | Status: DC

## 2013-03-15 NOTE — Progress Notes (Signed)
BBS clear to ausculation. However, she has been having intermittent wheezing with her congested cough. She states that the nebulizer has helped her to breath better. RT protocol assessment completed. Although she scores low, no changes were made to her scheduled bronchodilator.

## 2013-03-15 NOTE — Care Management Note (Signed)
CM spoke with patient concerning MATCH ASSISTANCE eligibility. CM discussed which non-generic medications eligible for assistance with program. Pt verbalized understanding. Pt chose [facility] on [address]. Cm faxed copy of Match letter with copy of rx to [facility] at [phone]. No other barriers identified at this time. Pt to follow up at [facility].   Roxy Manns Correna Meacham,RN,MSN [phone]

## 2013-03-19 ENCOUNTER — Institutional Professional Consult (permissible substitution): Payer: Self-pay | Admitting: Internal Medicine

## 2013-03-23 ENCOUNTER — Ambulatory Visit: Payer: Self-pay | Attending: Internal Medicine | Admitting: Internal Medicine

## 2013-03-23 ENCOUNTER — Encounter: Payer: Self-pay | Admitting: Internal Medicine

## 2013-03-23 MED ORDER — DOXYCYCLINE HYCLATE 100 MG PO TABS: 100.0000 mg | ORAL_TABLET | Freq: Two times a day (BID) | ORAL | Status: AC

## 2013-03-23 MED ORDER — ALBUTEROL SULFATE (2.5 MG/3ML) 0.083% IN NEBU: 2.5000 mg | INHALATION_SOLUTION | Freq: Four times a day (QID) | RESPIRATORY_TRACT | Status: DC | PRN

## 2013-03-23 MED ORDER — BENZOYL PEROXIDE 5 % EX LIQD: Freq: Two times a day (BID) | CUTANEOUS | Status: DC

## 2013-03-23 NOTE — Progress Notes (Unsigned)
Hospital follow up-asthma

## 2013-03-23 NOTE — Progress Notes (Unsigned)
Patient ID: Wendy James, female   DOB: Apr 13, 1971, 42 y.o.   MRN: [id number]  CC:  HPI: 42 year old female is here for followup of her asthma The patient states that her asthma has been stable but after completion of her steroid cover she has broken into a rash. She has multiple papular pustular lesions on her chest and some on her back. This has the appearance of acne No Known Allergies Past Medical History  Diagnosis Date  . Asthma   . Seasonal allergies   . Arthritis    Current Outpatient Prescriptions on File Prior to Visit  Medication Sig Dispense Refill  . albuterol (PROVENTIL HFA;VENTOLIN HFA) 108 (90 BASE) MCG/ACT inhaler Inhale 2 puffs into the lungs every 4 (four) hours as needed for wheezing. For shortness of breath and wheezing  1 Inhaler  1  . albuterol (PROVENTIL) (2.5 MG/3ML) 0.083% nebulizer solution Take 3 mL (2.5 mg total) by nebulization every 6 (six) hours as needed for wheezing. For shortness of breath  75 mL  1  . budesonide-formoterol (SYMBICORT) 80-4.5 MCG/ACT inhaler Inhale 2 puffs into the lungs 2 (two) times daily.  1 Inhaler  1  . hydrochlorothiazide (HYDRODIURIL) 25 MG tablet Take 1 tablet (25 mg total) by mouth daily.  30 tablet  1  . predniSONE (DELTASONE) 10 MG tablet Take 50 mg for 2 days then 40mg  for 2 days then 20mg  for 2 days then STOP  15 tablet  0  . tiotropium (SPIRIVA HANDIHALER) 18 MCG inhalation capsule Place 1 capsule (18 mcg total) into inhaler and inhale daily.  30 capsule  1  . UNABLE TO FIND OK to return to work 8/20 Please excuse Ms.Cromwell from work 8/7 to 8/20 Due to Medical Illness requiring hospitalization  1 %  0   No current facility-administered medications on file prior to visit.   Family History  Problem Relation Age of Onset  . Hypertension Mother   . Diabetes Mother   . Cancer Mother    History   Social History  . Marital Status: Single    Spouse Name: N/A    Number of Children: N/A  . Years of Education: N/A    Occupational History  . Not on file.   Social History Main Topics  . Smoking status: Former Games developer  . Smokeless tobacco: Never Used  . Alcohol Use: No  . Drug Use: No  . Sexual Activity: Yes    Birth Control/ Protection: Condom   Other Topics Concern  . Not on file   Social History Narrative  . No narrative on file    Review of Systems  Constitutional: Negative for fever, chills, diaphoresis, activity change, appetite change and fatigue.  HENT: Negative for ear pain, nosebleeds, congestion, facial swelling, rhinorrhea, neck pain, neck stiffness and ear discharge.   Eyes: Negative for pain, discharge, redness, itching and visual disturbance.  Respiratory: Negative for cough, choking, chest tightness, shortness of breath, wheezing and stridor.   Cardiovascular: Negative for chest pain, palpitations and leg swelling.  Gastrointestinal: Negative for abdominal distention.  Genitourinary: Negative for dysuria, urgency, frequency, hematuria, flank pain, decreased urine volume, difficulty urinating and dyspareunia.  Musculoskeletal: Negative for back pain, joint swelling, arthralgias and gait problem.  Neurological: Negative for dizziness, tremors, seizures, syncope, facial asymmetry, speech difficulty, weakness, light-headedness, numbness and headaches.  Hematological: Negative for adenopathy. Does not bruise/bleed easily.  Psychiatric/Behavioral: Negative for hallucinations, behavioral problems, confusion, dysphoric mood, decreased concentration and agitation.    Objective:   Filed  Vitals:   03/23/13 1521  BP: 120/78  Pulse: 90  Temp: 97 F (36.1 C)  Resp: 16    Physical Exam  Constitutional: Appears well-developed and well-nourished. No distress.  HENT: Normocephalic. External right and left ear normal. Oropharynx is clear and moist.  Eyes: Conjunctivae and EOM are normal. PERRLA, no scleral icterus.  Neck: Normal ROM. Neck supple. No JVD. No tracheal deviation. No  thyromegaly.  CVS: RRR, S1/S2 +, no murmurs, no gallops, no carotid bruit.  Pulmonary: Effort and breath sounds normal, no stridor, rhonchi, wheezes, rales.  Abdominal: Soft. BS +,  no distension, tenderness, rebound or guarding.  Musculoskeletal: Normal range of motion. No edema and no tenderness.  Lymphadenopathy: No lymphadenopathy noted, cervical, inguinal. Neuro: Alert. Normal reflexes, muscle tone coordination. No cranial nerve deficit. Skin: Skin is warm and dry. No rash noted. Not diaphoretic. No erythema. No pallor.  Psychiatric: Normal mood and affect. Behavior, judgment, thought content normal.   Lab Results  Component Value Date   WBC 15.6* 03/12/2013   HGB 11.0* 03/12/2013   HCT 34.0* 03/12/2013   MCV 84.2 03/12/2013   PLT 217 03/12/2013   Lab Results  Component Value Date   CREATININE 0.54 03/12/2013   BUN 10 03/12/2013   NA 135 03/12/2013   K 3.9 03/12/2013   CL 99 03/12/2013   CO2 29 03/12/2013    Lab Results  Component Value Date   HGBA1C 5.7* 01/05/2013   Lipid Panel     Component Value Date/Time   CHOL 186 01/05/2013 1737   TRIG 99 01/05/2013 1737   HDL 67 01/05/2013 1737   CHOLHDL 2.8 01/05/2013 1737   VLDL 20 01/05/2013 1737   LDLCALC 99 01/05/2013 1737       Assessment and plan:   Patient Active Problem List   Diagnosis Date Noted  . Elevated BP 04/13/2012  . Bronchitis, acute 04/12/2012  . Hypokalemia 04/10/2012  . Acute asthma exacerbation 03/12/2012  . Acute respiratory failure 03/12/2012  . Asthma in adult 03/12/2012  . Chest tightness 03/12/2012  . Menorrhagia 01/10/2012  . Dysmenorrhea 01/10/2012   Asthma stable  Steroid-induced acne We'll prescribe doxycycline for 7 days and benzoyl peroxide cream and have the patient follow up in one week      The patient was given clear instructions to go to ER or return to medical center if symptoms don't improve, worsen or new problems develop. The patient verbalized understanding. The patient was told to call  to get any lab results if not heard anything in the next week.

## 2013-03-25 NOTE — Discharge Summary (Signed)
Physician Discharge Summary  Wendy James ZOX:[id number] DOB: 1970-09-12 DOA: 03/05/2013  PCP: No PCP Per Patient  Admit date: 03/05/2013 Discharge date: 03/15/2013  Time spent: 45 minutes  Recommendations for Outpatient Follow-up:  1. [facility] Dr.Wert 8/21  Discharge Diagnoses:  Principal Problem:   Acute asthma exacerbation   Hypokalemia   Anemia   Hypertension  Discharge Condition:Improved  Diet recommendation: low sodium  Filed Weights   03/05/13 2322  Weight: 92.4 kg (203 lb 11.3 oz)    History of present illness:  Wendy James is a 42 y.o. female with known history of bronchial asthma has been experiencing shortness of breath last 2 days with productive cough. Since her shortness of breath was persistent despite taking multiple doses of albuterol patient came to the ER. In the ER patient was given nebulizers and patient was still wheezing and has been admitted for exacerbation.  Hospital Course:  Acute moderate persistent asthma exacerbation  - She was treated with aggressive prolonged course of IV steroids since she failed attempts at weaning down steroids with increased wheezing/dyspnea. -had CT Chest done as a result, but this was normal. -Finally we were able to slowly wean down her steroids and start Prednisone taper which she tolerated this time around. -completed antibiotic course inpatient -discharged home on Prednisone taper, symbicort, spiriva -I made her FU with  Pulm FU Dr.Wert on 8/21  Hypokalemia  - repleted and corrected   Anemia  - hemoglobin stable at baseline  - likely due to menorrhagia, FU with PCP/Gyn  Hypertension  - BP on higher side, likely due to steroids  -continued on HCTZ    Discharge Exam: Filed Vitals:   03/15/13 0448  BP: 155/85  Pulse: 70  Temp: 98.3 F (36.8 C)  Resp:     General: AAOx3 Cardiovascular: S1s2/RRR Respiratory:improved air movement  Discharge Instructions  Discharge Orders   Future Appointments Provider Department Dept Phone   04/03/2013 3:45 PM [facility] Covering Provider [facility] [phone]   Future Orders Complete By Expires   Diet - low sodium heart healthy  As directed    Increase activity slowly  As directed        Medication List    STOP taking these medications       ibuprofen 200 MG tablet  Commonly known as:  ADVIL,MOTRIN      TAKE these medications       albuterol 108 (90 BASE) MCG/ACT inhaler  Commonly known as:  PROVENTIL HFA;VENTOLIN HFA  Inhale 2 puffs into the lungs every 4 (four) hours as needed for wheezing. For shortness of breath and wheezing     budesonide-formoterol 80-4.5 MCG/ACT inhaler  Commonly known as:  SYMBICORT  Inhale 2 puffs into the lungs 2 (two) times daily.     hydrochlorothiazide 25 MG tablet  Commonly known as:  HYDRODIURIL  Take 1 tablet (25 mg total) by mouth daily.     predniSONE 10 MG tablet  Commonly known as:  DELTASONE  Take 50 mg for 2 days then 40mg  for 2 days then 20mg  for 2 days then STOP     tiotropium 18 MCG inhalation capsule  Commonly known as:  SPIRIVA HANDIHALER  Place 1 capsule (18 mcg total) into inhaler and inhale daily.     UNABLE TO FIND  - OK to return to work 8/20  - Please excuse Wendy James from work 8/7 to 8/20 Due to Medical Illness requiring hospitalization  No Known Allergies     Follow-up Information   Follow up with Debbora Presto, MD On 03/23/2013. (@ 3:30P:[facility])    Specialty:  Internal Medicine   Contact information:   [address] [place] [address] [phone]       Follow up with Sandrea Hughs, MD On 03/19/2013. (at 3pm)    Specialty:  Pulmonary Disease   Contact information:   [address] [place] [address] [phone]        The results of significant diagnostics from this hospitalization (including imaging, microbiology, ancillary and laboratory) are listed below for  reference.    Significant Diagnostic Studies: Dg Chest 2 View (if Patient Has Fever And/or Copd)  03/05/2013   *RADIOLOGY REPORT*  Clinical Data: Asthma, shortness of breath  CHEST - 2 VIEW  Comparison:  10/13/2012  Findings:  The heart size and mediastinal contours are within normal limits.  Both lungs are clear.  The visualized skeletal structures are unremarkable.  IMPRESSION: No active cardiopulmonary disease.   Original Report Authenticated By: Judie Petit. Miles Costain, M.D.   Ct Chest Wo Contrast  03/11/2013   *RADIOLOGY REPORT*  Clinical Data: Shortness of breath, wheezing  CT CHEST WITHOUT CONTRAST  Technique:  Multidetector CT imaging of the chest was performed following the standard protocol without IV contrast.  Comparison: Chest radiographs dated 03/05/2013  Findings: Mild linear scarring versus atelectasis in the bilateral lower lobes.  The lungs are otherwise clear.  No focal consolidation.  No suspicious pulmonary nodules. No pleural effusion or pneumothorax.  Visualized thyroid is unremarkable.  The heart is normal in size.  No pericardial effusion.  No suspicious mediastinal, hilar, or axillary lymphadenopathy.  Visualized upper abdomen is unremarkable.  Mild degenerative changes of the visualized thoracolumbar spine.  IMPRESSION: No evidence of acute cardiopulmonary disease.   Original Report Authenticated By: Charline Bills, M.D.    Microbiology: No results found for this or any previous visit (from the past 240 hour(s)).   Labs: Basic Metabolic Panel: No results found for this basename: NA, K, CL, CO2, GLUCOSE, BUN, CREATININE, CALCIUM, MG, PHOS,  in the last 168 hours Liver Function Tests: No results found for this basename: AST, ALT, ALKPHOS, BILITOT, PROT, ALBUMIN,  in the last 168 hours No results found for this basename: LIPASE, AMYLASE,  in the last 168 hours No results found for this basename: AMMONIA,  in the last 168 hours CBC: No results found for this basename: WBC, NEUTROABS,  HGB, HCT, MCV, PLT,  in the last 168 hours Cardiac Enzymes: No results found for this basename: CKTOTAL, CKMB, CKMBINDEX, TROPONINI,  in the last 168 hours BNP: BNP (last 3 results) No results found for this basename: PROBNP,  in the last 8760 hours CBG: No results found for this basename: GLUCAP,  in the last 168 hours     Signed:  Ovid Witman  [facility] 03/25/2013, 9:21 PM

## 2013-04-03 ENCOUNTER — Ambulatory Visit: Payer: Self-pay

## 2013-05-15 ENCOUNTER — Encounter (HOSPITAL_COMMUNITY): Payer: Self-pay | Admitting: Emergency Medicine

## 2013-05-15 ENCOUNTER — Emergency Department (INDEPENDENT_AMBULATORY_CARE_PROVIDER_SITE_OTHER)
Admission: EM | Admit: 2013-05-15 | Discharge: 2013-05-15 | Disposition: A | Discharge status: Home / Self Care | Payer: PRIVATE HEALTH INSURANCE | Source: Home / Self Care | Attending: Family Medicine | Admitting: Family Medicine

## 2013-05-15 DIAGNOSIS — M79602 Pain in left arm: Secondary | ICD-10-CM

## 2013-05-15 DIAGNOSIS — M79609 Pain in unspecified limb: Secondary | ICD-10-CM

## 2013-05-15 DIAGNOSIS — M7989 Other specified soft tissue disorders: Secondary | ICD-10-CM

## 2013-05-15 LAB — D-DIMER, QUANTITATIVE: D-Dimer, Quant: <0.27 ug/mL-FEU (ref 0.00–0.48)

## 2013-05-15 LAB — CBC WITH DIFFERENTIAL/PLATELET
Basophils Absolute: 0.1 10*3/uL (ref 0.0–0.1)
Basophils Relative: 1 % (ref 0–1)
Eosinophils Absolute: 0.3 10*3/uL (ref 0.0–0.7)
Eosinophils Relative: 5 % (ref 0–5)
Lymphocytes Relative: 57 % — ABNORMAL HIGH (ref 12–46)
MCHC: 33.6 g/dL (ref 30.0–36.0)
MCV: 85.5 fL (ref 78.0–100.0)
Platelets: 244 10*3/uL (ref 150–400)
RDW: 15.3 % (ref 11.5–15.5)
WBC: 5.2 10*3/uL (ref 4.0–10.5)

## 2013-05-15 MED ORDER — KETOROLAC TROMETHAMINE 60 MG/2ML IM SOLN
30.0000 mg | Freq: Once | INTRAMUSCULAR | Status: AC
  Administered 2013-05-15: 30 mg via INTRAMUSCULAR

## 2013-05-15 MED ORDER — NAPROXEN 500 MG PO TABS: 500.0000 mg | ORAL_TABLET | Freq: Two times a day (BID) | ORAL | Status: DC

## 2013-05-15 MED ORDER — KETOROLAC TROMETHAMINE 30 MG/ML IJ SOLN
INTRAMUSCULAR | Status: AC
  Filled 2013-05-15: qty 1

## 2013-05-15 MED ORDER — HYDROCODONE-ACETAMINOPHEN 5-325 MG PO TABS
ORAL_TABLET | ORAL | Status: AC
  Filled 2013-05-15: qty 1

## 2013-05-15 MED ORDER — TRAMADOL HCL 50 MG PO TABS: 50.0000 mg | ORAL_TABLET | Freq: Four times a day (QID) | ORAL | Status: DC | PRN

## 2013-05-15 MED ORDER — HYDROCODONE-ACETAMINOPHEN 5-325 MG PO TABS
1.0000 | ORAL_TABLET | Freq: Once | ORAL | Status: AC
Start: 2013-05-15 — End: 2013-05-15
  Administered 2013-05-15: 1 via ORAL

## 2013-05-15 NOTE — ED Notes (Signed)
Left shoulder pain that radiates down arm. Severe pain felt in forearm and numbness in finger tips. Denies injury. Pt has tried ibuprofen with no relief in symptoms.   On set Monday. Pt states that she felt like she slept wrong but pain has not gone away. Pain is sharp / ache.

## 2013-05-15 NOTE — ED Provider Notes (Signed)
CSN: [id number]     Arrival date & time 05/15/13  1655 History   First MD Initiated Contact with Patient 05/15/13 1836     Chief Complaint  Patient presents with  . Shoulder Pain   (Consider location/radiation/quality/duration/timing/severity/associated sxs/prior Treatment) HPI Comments: 42 year old female presents complaining of left elbow pain. This began on Monday when she woke up. At first, it was up around her neck and shoulder. She thought she slept on her arm wrong. Since then, the pain up around the neck and shoulder has started to resolve, but now she has severe pain in the elbow. She feels like her entire left arm is swollen. She has constant pain in the elbow and the area is tender to touch. Apart from that, she is not having any issues. She denies any known injury. no recent air travel. No history of DVT or PE. She does not take any hormonal contraceptives.  Patient is a 42 y.o. female presenting with shoulder pain.  Shoulder Pain Pertinent negatives include no chest pain, no abdominal pain and no shortness of breath.    Past Medical History  Diagnosis Date  . Asthma   . Seasonal allergies   . Arthritis    Past Surgical History  Procedure Laterality Date  . Cholecystectomy    . Cesarean section    . Tubal ligation     Family History  Problem Relation Age of Onset  . Hypertension Mother   . Diabetes Mother   . Cancer Mother    History  Substance Use Topics  . Smoking status: Former Games developer  . Smokeless tobacco: Never Used  . Alcohol Use: No   OB History   Grav Para Term Preterm Abortions TAB SAB Ect Mult Living   5 4 4  0 1 0 1 0 0 3     Review of Systems  Constitutional: Negative for fever and chills.  Eyes: Negative for visual disturbance.  Respiratory: Negative for cough and shortness of breath.   Cardiovascular: Negative for chest pain, palpitations and leg swelling.  Gastrointestinal: Negative for nausea, vomiting and abdominal pain.  Endocrine:  Negative for polydipsia and polyuria.  Genitourinary: Negative for dysuria, urgency and frequency.  Musculoskeletal: Positive for arthralgias and myalgias.  Skin: Negative for rash.  Neurological: Negative for dizziness, weakness and light-headedness.    Allergies  Review of patient's allergies indicates no known allergies.  Home Medications   Current Outpatient Rx  Name  Route  Sig  Dispense  Refill  . albuterol (PROVENTIL HFA;VENTOLIN HFA) 108 (90 BASE) MCG/ACT inhaler   Inhalation   Inhale 2 puffs into the lungs every 4 (four) hours as needed for wheezing. For shortness of breath and wheezing   1 Inhaler   1   . albuterol (PROVENTIL) (2.5 MG/3ML) 0.083% nebulizer solution   Nebulization   Take 3 mLs (2.5 mg total) by nebulization every 6 (six) hours as needed for wheezing. For shortness of breath   75 mL   1   . benzoyl peroxide 5 % external liquid   Topical   Apply topically 2 (two) times daily.   142 g   12   . budesonide-formoterol (SYMBICORT) 80-4.5 MCG/ACT inhaler   Inhalation   Inhale 2 puffs into the lungs 2 (two) times daily.   1 Inhaler   1   . hydrochlorothiazide (HYDRODIURIL) 25 MG tablet   Oral   Take 1 tablet (25 mg total) by mouth daily.   30 tablet   1   .  naproxen (NAPROSYN) 500 MG tablet   Oral   Take 1 tablet (500 mg total) by mouth 2 (two) times daily.   30 tablet   0   . predniSONE (DELTASONE) 10 MG tablet      Take 50 mg for 2 days then 40mg  for 2 days then 20mg  for 2 days then STOP   15 tablet   0   . tiotropium (SPIRIVA HANDIHALER) 18 MCG inhalation capsule   Inhalation   Place 1 capsule (18 mcg total) into inhaler and inhale daily.   30 capsule   1   . traMADol (ULTRAM) 50 MG tablet   Oral   Take 1 tablet (50 mg total) by mouth every 6 (six) hours as needed for pain.   20 tablet   0   . UNABLE TO FIND      OK to return to work 8/20 Please excuse Ms.Gudino from work 8/7 to 8/20 Due to Medical Illness requiring  hospitalization   1 %   0    BP 159/105  Pulse 67  Temp(Src) 99.7 F (37.6 C) (Oral)  Resp 20  SpO2 100% Physical Exam  Nursing note and vitals reviewed. Constitutional: She is oriented to person, place, and time. Vital signs are normal. She appears well-developed and well-nourished. No distress.  HENT:  Head: Normocephalic and atraumatic.  Pulmonary/Chest: Effort normal. No respiratory distress.  Musculoskeletal:       Right shoulder: Normal.       Right elbow: She exhibits decreased range of motion and swelling. Tenderness (diffuse) found.       Right wrist: She exhibits tenderness and swelling.       Right upper arm: She exhibits tenderness and swelling.       Right forearm: She exhibits tenderness and swelling.       Right hand: She exhibits tenderness and swelling.  Neurological: She is alert and oriented to person, place, and time. She has normal strength. Coordination normal.  Skin: Skin is warm and dry. No rash noted. She is not diaphoretic.  Psychiatric: She has a normal mood and affect. Judgment normal.    ED Course  Procedures (including critical care time) Labs Review Labs Reviewed  CBC WITH DIFFERENTIAL - Abnormal; Notable for the following:    Hemoglobin 11.3 (*)    HCT 33.6 (*)    Neutrophils Relative % 28 (*)    Neutro Abs 1.5 (*)    Lymphocytes Relative 57 (*)    All other components within normal limits  D-DIMER, QUANTITATIVE   Imaging Review No results found.    MDM   1. Arm pain, left   2. Left arm swelling    Waited for a long time for lab results but there appears to be some delay. I will give her a call tomorrow with results and a plan. Phone numbers below.  Pt: [phone]  Husband: [phone]  Discharged with pain control   Meds ordered this encounter  Medications  . ketorolac (TORADOL) injection 30 mg    Sig:   . HYDROcodone-acetaminophen (NORCO/VICODIN) 5-325 MG per tablet 1 tablet    Sig:   . naproxen (NAPROSYN) 500 MG  tablet    Sig: Take 1 tablet (500 mg total) by mouth 2 (two) times daily.    Dispense:  30 tablet    Refill:  0    Order Specific Question:  Supervising Provider    Answer:  Clementeen Graham, S [id number]  . traMADol (ULTRAM) 50 MG tablet  Sig: Take 1 tablet (50 mg total) by mouth every 6 (six) hours as needed for pain.    Dispense:  20 tablet    Refill:  0    Order Specific Question:  Supervising Provider    Answer:  Clementeen Graham, S [[id number]]    Labs came back, d-dimer is negative. Does not appear to be infection. Treat with the naproxen and tramadol. Followup as needed  Graylon Good, PA-C 05/16/13 2107  Spoke with pt about lab results.  She is still not improving.  She prefers to wait a few days more to see if it is improving, if not we will refer to ortho.    Graylon Good, PA-C 05/17/13 [phone]

## 2013-05-18 NOTE — ED Provider Notes (Signed)
Medical screening examination/treatment/procedure(s) were performed by a resident physician or non-physician practitioner and as the supervising physician I was immediately available for consultation/collaboration.  Clementeen Graham, MD    Rodolph Bong, MD 05/18/13 [phone]

## 2013-06-04 ENCOUNTER — Other Ambulatory Visit: Payer: Self-pay

## 2013-06-21 ENCOUNTER — Emergency Department (INDEPENDENT_AMBULATORY_CARE_PROVIDER_SITE_OTHER)
Admission: EM | Admit: 2013-06-21 | Discharge: 2013-06-21 | Disposition: A | Discharge status: Home / Self Care | Payer: PRIVATE HEALTH INSURANCE | Source: Home / Self Care | Attending: Emergency Medicine | Admitting: Emergency Medicine

## 2013-06-21 ENCOUNTER — Encounter (HOSPITAL_COMMUNITY): Payer: Self-pay | Admitting: Emergency Medicine

## 2013-06-21 DIAGNOSIS — J45901 Unspecified asthma with (acute) exacerbation: Secondary | ICD-10-CM

## 2013-06-21 MED ORDER — IPRATROPIUM BROMIDE 0.02 % IN SOLN
RESPIRATORY_TRACT | Status: AC
  Filled 2013-06-21: qty 2.5

## 2013-06-21 MED ORDER — PREDNISONE 10 MG PO KIT: PACK | ORAL | Status: DC

## 2013-06-21 MED ORDER — METHYLPREDNISOLONE SODIUM SUCC 125 MG IJ SOLR
125.0000 mg | Freq: Once | INTRAMUSCULAR | Status: AC
  Administered 2013-06-21: 125 mg via INTRAMUSCULAR

## 2013-06-21 MED ORDER — IPRATROPIUM BROMIDE 0.02 % IN SOLN: 0.5000 mg | RESPIRATORY_TRACT | Status: DC

## 2013-06-21 MED ORDER — SODIUM CHLORIDE 0.9 % IN NEBU
INHALATION_SOLUTION | RESPIRATORY_TRACT | Status: AC
  Filled 2013-06-21: qty 3

## 2013-06-21 MED ORDER — ALBUTEROL SULFATE (5 MG/ML) 0.5% IN NEBU
10.0000 mg | INHALATION_SOLUTION | Freq: Once | RESPIRATORY_TRACT | Status: AC
  Administered 2013-06-21: 10 mg via RESPIRATORY_TRACT

## 2013-06-21 MED ORDER — ALBUTEROL SULFATE (5 MG/ML) 0.5% IN NEBU: 10.0000 mg | INHALATION_SOLUTION | RESPIRATORY_TRACT | Status: DC

## 2013-06-21 MED ORDER — MONTELUKAST SODIUM 10 MG PO TABS: 10.0000 mg | ORAL_TABLET | Freq: Every day | ORAL | Status: DC

## 2013-06-21 MED ORDER — ALBUTEROL SULFATE (5 MG/ML) 0.5% IN NEBU
INHALATION_SOLUTION | RESPIRATORY_TRACT | Status: AC
  Filled 2013-06-21: qty 1

## 2013-06-21 MED ORDER — FLUTICASONE-SALMETEROL 500-50 MCG/DOSE IN AEPB: 1.0000 | INHALATION_SPRAY | Freq: Two times a day (BID) | RESPIRATORY_TRACT | Status: DC

## 2013-06-21 MED ORDER — ALBUTEROL SULFATE HFA 108 (90 BASE) MCG/ACT IN AERS: 2.0000 | INHALATION_SPRAY | RESPIRATORY_TRACT | Status: DC | PRN

## 2013-06-21 MED ORDER — ALBUTEROL SULFATE (2.5 MG/3ML) 0.083% IN NEBU: 2.5000 mg | INHALATION_SOLUTION | RESPIRATORY_TRACT | Status: DC | PRN

## 2013-06-21 MED ORDER — IPRATROPIUM BROMIDE 0.02 % IN SOLN
0.5000 mg | Freq: Once | RESPIRATORY_TRACT | Status: AC
  Administered 2013-06-21: 0.5 mg via RESPIRATORY_TRACT

## 2013-06-21 MED ORDER — ALBUTEROL SULFATE (5 MG/ML) 0.5% IN NEBU
INHALATION_SOLUTION | RESPIRATORY_TRACT | Status: AC
  Filled 2013-06-21: qty 2

## 2013-06-21 MED ORDER — METHYLPREDNISOLONE SODIUM SUCC 125 MG IJ SOLR
INTRAMUSCULAR | Status: AC
  Filled 2013-06-21: qty 2

## 2013-06-21 NOTE — ED Provider Notes (Signed)
CSN: [id number]     Arrival date & time 06/21/13  7829 History   First MD Initiated Contact with Patient 06/21/13 [phone]     Chief Complaint  Patient presents with  . Asthma   (Consider location/radiation/quality/duration/timing/severity/associated sxs/prior Treatment) HPI Comments: 42 year old female presents complaining of an asthma flare up since last night. She has wheezing and has been using albuterol nebulizer every 4 hours without relief. She is also using her Symbicort as directed. She is wheezing and chest tightness. She is requesting prednisone and a blood sugar pill because prednisone makes her blood sugar very high. She does not have a previous diagnosis of diabetes, only has blood sugar problems when she is taking steroids. No recent illness, fever, chills, NVD  Patient is a 42 y.o. female presenting with asthma.  Asthma Associated symptoms include shortness of breath. Pertinent negatives include no chest pain and no abdominal pain.    Past Medical History  Diagnosis Date  . Asthma   . Seasonal allergies   . Arthritis    Past Surgical History  Procedure Laterality Date  . Cholecystectomy    . Cesarean section    . Tubal ligation     Family History  Problem Relation Age of Onset  . Hypertension Mother   . Diabetes Mother   . Cancer Mother    History  Substance Use Topics  . Smoking status: Former Games developer  . Smokeless tobacco: Never Used  . Alcohol Use: No   OB History   Grav Para Term Preterm Abortions TAB SAB Ect Mult Living   5 4 4  0 1 0 1 0 0 3     Review of Systems  Constitutional: Negative for fever and chills.  Eyes: Negative for visual disturbance.  Respiratory: Positive for cough, chest tightness, shortness of breath and wheezing.   Cardiovascular: Negative for chest pain, palpitations and leg swelling.  Gastrointestinal: Negative for nausea, vomiting and abdominal pain.  Endocrine: Negative for polydipsia and polyuria.  Genitourinary: Negative for  dysuria, urgency and frequency.  Musculoskeletal: Negative for arthralgias and myalgias.  Skin: Negative for rash.  Neurological: Negative for dizziness, weakness and light-headedness.    Allergies  Review of patient's allergies indicates no known allergies.  Home Medications   Current Outpatient Rx  Name  Route  Sig  Dispense  Refill  . albuterol (PROVENTIL) (2.5 MG/3ML) 0.083% nebulizer solution   Nebulization   Take 3 mLs (2.5 mg total) by nebulization every 6 (six) hours as needed for wheezing. For shortness of breath   75 mL   1   . budesonide-formoterol (SYMBICORT) 80-4.5 MCG/ACT inhaler   Inhalation   Inhale 2 puffs into the lungs 2 (two) times daily.   1 Inhaler   1   . traMADol (ULTRAM) 50 MG tablet   Oral   Take 1 tablet (50 mg total) by mouth every 6 (six) hours as needed for pain.   20 tablet   0   . albuterol (PROVENTIL HFA;VENTOLIN HFA) 108 (90 BASE) MCG/ACT inhaler   Inhalation   Inhale 2 puffs into the lungs every 4 (four) hours as needed for wheezing. For shortness of breath and wheezing   1 Inhaler   1   . albuterol (PROVENTIL HFA;VENTOLIN HFA) 108 (90 BASE) MCG/ACT inhaler   Inhalation   Inhale 2 puffs into the lungs every 4 (four) hours as needed for wheezing.   1 Inhaler   2   . albuterol (PROVENTIL) (2.5 MG/3ML) 0.083% nebulizer solution  Nebulization   Take 3 mLs (2.5 mg total) by nebulization every 4 (four) hours as needed for wheezing or shortness of breath.   75 mL   12   . benzoyl peroxide 5 % external liquid   Topical   Apply topically 2 (two) times daily.   142 g   12   . Fluticasone-Salmeterol (ADVAIR DISKUS) 500-50 MCG/DOSE AEPB   Inhalation   Inhale 1 puff into the lungs every 12 (twelve) hours.   60 each   1   . hydrochlorothiazide (HYDRODIURIL) 25 MG tablet   Oral   Take 1 tablet (25 mg total) by mouth daily.   30 tablet   1   . montelukast (SINGULAIR) 10 MG tablet   Oral   Take 1 tablet (10 mg total) by mouth at  bedtime.   30 tablet   1   . naproxen (NAPROSYN) 500 MG tablet   Oral   Take 1 tablet (500 mg total) by mouth 2 (two) times daily.   30 tablet   0   . predniSONE (DELTASONE) 10 MG tablet      Take 50 mg for 2 days then 40mg  for 2 days then 20mg  for 2 days then STOP   15 tablet   0   . PredniSONE 10 MG KIT      12 day taper dose pack. Use as directed   48 each   0   . tiotropium (SPIRIVA HANDIHALER) 18 MCG inhalation capsule   Inhalation   Place 1 capsule (18 mcg total) into inhaler and inhale daily.   30 capsule   1   . UNABLE TO FIND      OK to return to work 8/20 Please excuse Ms.Toto from work 8/7 to 8/20 Due to Medical Illness requiring hospitalization   1 %   0    Pulse 76  Temp(Src) 98.2 F (36.8 C) (Oral)  Resp 25  SpO2 97%  LMP 06/06/2013 Physical Exam  Nursing note and vitals reviewed. Constitutional: She is oriented to person, place, and time. Vital signs are normal. She appears well-developed and well-nourished. No distress.  HENT:  Head: Normocephalic and atraumatic.  Pulmonary/Chest: She is in respiratory distress (increased respiratory effort). She has decreased breath sounds. She has wheezes (Diffuse, expiratory). She has no rhonchi. She has no rales.  Neurological: She is alert and oriented to person, place, and time. She has normal strength. Coordination normal.  Skin: Skin is warm and dry. No rash noted. She is not diaphoretic.  Psychiatric: She has a normal mood and affect. Judgment normal.    ED Course  Procedures (including critical care time) Labs Review Labs Reviewed - No data to display Imaging Review No results found.  She is currently maintaining her oxygen saturation but is tachypneic and in some respiratory distress.   Significant wheezing on exam, very tight. Peak flow markedly reduced at 210. She has multiple hospitalizations in the recent past for asthma. We will give 125 of Solu-Medrol IM and gave a high dose nebulizer  treatment. If she is not asymptomatic at the completion of the nebulizer or shortly thereafter, she will probably need to go to the hospital and be admitted again. Her previous exacerbation took one week of intravenous steroids, Solu-Medrol 60-80 milligrams every 6 hours to get this under control.  MDM   1. Asthma exacerbation    Wheezing has completely resolved after breathing treatment. She is in no distress, breathing comfortably. Vitals are stable and normal, respiratory  rate now within normal limits. Oxygen saturation normal. I have reviewed her previous admissions-3 in the past year-she was never given any insulin or had to have any blood sugar control. Will switch her to Advair, add Singulair, prescribe albuterol, 12 day prednisone taper dose pack. Return to the emergency department if any worsening despite interventions.   Meds ordered this encounter  Medications  . methylPREDNISolone sodium succinate (SOLU-MEDROL) 125 mg/2 mL injection 125 mg    Sig:   . DISCONTD: ipratropium (ATROVENT) nebulizer solution 0.5 mg    Sig:   . DISCONTD: albuterol (PROVENTIL) (5 MG/ML) 0.5% nebulizer solution 10 mg    Sig:   . albuterol (PROVENTIL) (5 MG/ML) 0.5% nebulizer solution 10 mg    Sig:    And  . ipratropium (ATROVENT) nebulizer solution 0.5 mg    Sig:   . Fluticasone-Salmeterol (ADVAIR DISKUS) 500-50 MCG/DOSE AEPB    Sig: Inhale 1 puff into the lungs every 12 (twelve) hours.    Dispense:  60 each    Refill:  1    Order Specific Question:  Supervising Provider    Answer:  Lorenz Coaster, DAVID C [id number]  . montelukast (SINGULAIR) 10 MG tablet    Sig: Take 1 tablet (10 mg total) by mouth at bedtime.    Dispense:  30 tablet    Refill:  1    Order Specific Question:  Supervising Provider    Answer:  Lorenz Coaster, DAVID C [id number]  . albuterol (PROVENTIL) (2.5 MG/3ML) 0.083% nebulizer solution    Sig: Take 3 mLs (2.5 mg total) by nebulization every 4 (four) hours as needed for wheezing or shortness of  breath.    Dispense:  75 mL    Refill:  12    Order Specific Question:  Supervising Provider    Answer:  Lorenz Coaster, DAVID C [id number]  . PredniSONE 10 MG KIT    Sig: 12 day taper dose pack. Use as directed    Dispense:  48 each    Refill:  0    Order Specific Question:  Supervising Provider    Answer:  Lorenz Coaster, DAVID C [id number]  . albuterol (PROVENTIL HFA;VENTOLIN HFA) 108 (90 BASE) MCG/ACT inhaler    Sig: Inhale 2 puffs into the lungs every 4 (four) hours as needed for wheezing.    Dispense:  1 Inhaler    Refill:  2    Order Specific Question:  Supervising Provider    Answer:  Lorenz Coaster, DAVID C [[id number]]       Graylon Good, PA-C 06/21/13 1048  Graylon Good, PA-C 06/21/13 1110

## 2013-06-21 NOTE — ED Notes (Signed)
C/O asthma flare-up since last night.  Denies any cold sxs.  Has been using albuterol nebs q 4 hrs, but is out of the albuterol MDI.  Also using her normal Symbicort.  Audible wheezing noted.

## 2013-06-21 NOTE — ED Provider Notes (Signed)
Medical screening examination/treatment/procedure(s) were performed by non-physician practitioner and as supervising physician I was immediately available for consultation/collaboration.  Hayven Croy, M.D.  Easton Fetty C Harmonii Karle, MD 06/21/13 2051 

## 2013-07-26 ENCOUNTER — Emergency Department (HOSPITAL_COMMUNITY)
Admission: EM | Admit: 2013-07-26 | Discharge: 2013-07-26 | Disposition: A | Discharge status: Home / Self Care | Payer: PRIVATE HEALTH INSURANCE | Attending: Emergency Medicine | Admitting: Emergency Medicine

## 2013-07-26 ENCOUNTER — Encounter (HOSPITAL_COMMUNITY): Payer: Self-pay | Admitting: Emergency Medicine

## 2013-07-26 ENCOUNTER — Emergency Department (HOSPITAL_COMMUNITY): Payer: PRIVATE HEALTH INSURANCE

## 2013-07-26 DIAGNOSIS — R112 Nausea with vomiting, unspecified: Secondary | ICD-10-CM | POA: Insufficient documentation

## 2013-07-26 DIAGNOSIS — Z79899 Other long term (current) drug therapy: Secondary | ICD-10-CM | POA: Insufficient documentation

## 2013-07-26 DIAGNOSIS — R42 Dizziness and giddiness: Secondary | ICD-10-CM | POA: Insufficient documentation

## 2013-07-26 DIAGNOSIS — J45909 Unspecified asthma, uncomplicated: Secondary | ICD-10-CM | POA: Insufficient documentation

## 2013-07-26 DIAGNOSIS — IMO0002 Reserved for concepts with insufficient information to code with codable children: Secondary | ICD-10-CM | POA: Insufficient documentation

## 2013-07-26 DIAGNOSIS — Z87891 Personal history of nicotine dependence: Secondary | ICD-10-CM | POA: Insufficient documentation

## 2013-07-26 DIAGNOSIS — Z8739 Personal history of other diseases of the musculoskeletal system and connective tissue: Secondary | ICD-10-CM | POA: Insufficient documentation

## 2013-07-26 LAB — CBC
HCT: 35.2 % — ABNORMAL LOW (ref 36.0–46.0)
Hemoglobin: 11.8 g/dL — ABNORMAL LOW (ref 12.0–15.0)
MCHC: 33.5 g/dL (ref 30.0–36.0)
Platelets: 222 10*3/uL (ref 150–400)
RBC: 4.13 MIL/uL (ref 3.87–5.11)
RDW: 14.4 % (ref 11.5–15.5)
WBC: 5 10*3/uL (ref 4.0–10.5)

## 2013-07-26 LAB — BASIC METABOLIC PANEL
BUN: 8 mg/dL (ref 6–23)
CO2: 22 mEq/L (ref 19–32)
Chloride: 107 mEq/L (ref 96–112)
GFR calc Af Amer: >90 mL/min (ref >90)
Glucose, Bld: 104 mg/dL — ABNORMAL HIGH (ref 70–99)
Potassium: 3.6 mEq/L (ref 3.5–5.1)
Sodium: 137 mEq/L (ref 135–145)

## 2013-07-26 MED ORDER — MECLIZINE HCL 25 MG PO TABS
25.0000 mg | ORAL_TABLET | Freq: Once | ORAL | Status: AC
  Administered 2013-07-26: 25 mg via ORAL
  Filled 2013-07-26: qty 1

## 2013-07-26 MED ORDER — MECLIZINE HCL 50 MG PO TABS: 25.0000 mg | ORAL_TABLET | Freq: Three times a day (TID) | ORAL | Status: DC | PRN

## 2013-07-26 NOTE — ED Notes (Signed)
Per pt, dizziness since yesterday, worse around 3 this morning-states room was spinning when she stood  up

## 2013-07-26 NOTE — ED Provider Notes (Signed)
CSN: [id number]     Arrival date & time 07/26/13  1140 History   First MD Initiated Contact with Patient 07/26/13 1153     Chief Complaint  Patient presents with  . Dizziness   (Consider location/radiation/quality/duration/timing/severity/associated sxs/prior Treatment) HPI Pt presenting with c/o vertigo, feeling the room spinning.  Pt states associated with this is nausea and vomiting.  Sensation of vertigo is worse with moving head and standing up.  No weakness of arms or legs, no changes in vision or speech.  No recent illness, no cough or cold symptoms.  No fainting.  Has not had any treatment prior to arrival today for this.  There are no other associated systemic symptoms, there are no other alleviating or modifying factors.   Past Medical History  Diagnosis Date  . Asthma   . Seasonal allergies   . Arthritis    Past Surgical History  Procedure Laterality Date  . Cholecystectomy    . Cesarean section    . Tubal ligation     Family History  Problem Relation Age of Onset  . Hypertension Mother   . Diabetes Mother   . Cancer Mother    History  Substance Use Topics  . Smoking status: Former Games developer  . Smokeless tobacco: Never Used  . Alcohol Use: No   OB History   Grav Para Term Preterm Abortions TAB SAB Ect Mult Living   5 4 4  0 1 0 1 0 0 3     Review of Systems ROS reviewed and all otherwise negative except for mentioned in HPI  Allergies  Review of patient's allergies indicates no known allergies.  Home Medications   Current Outpatient Rx  Name  Route  Sig  Dispense  Refill  . albuterol (PROVENTIL HFA;VENTOLIN HFA) 108 (90 BASE) MCG/ACT inhaler   Inhalation   Inhale 2 puffs into the lungs every 4 (four) hours as needed for wheezing. For shortness of breath and wheezing   1 Inhaler   1   . albuterol (PROVENTIL) (2.5 MG/3ML) 0.083% nebulizer solution   Nebulization   Take 3 mLs (2.5 mg total) by nebulization every 6 (six) hours as needed for wheezing. For  shortness of breath   75 mL   1   . budesonide-formoterol (SYMBICORT) 80-4.5 MCG/ACT inhaler   Inhalation   Inhale 2 puffs into the lungs 2 (two) times daily.   1 Inhaler   1   . diphenhydrAMINE (BENADRYL) 25 MG tablet   Oral   Take 25 mg by mouth every 6 (six) hours as needed for allergies.         . Fluticasone-Salmeterol (ADVAIR DISKUS) 500-50 MCG/DOSE AEPB   Inhalation   Inhale 1 puff into the lungs every 12 (twelve) hours.   60 each   1   . meclizine (ANTIVERT) 50 MG tablet   Oral   Take 0.5 tablets (25 mg total) by mouth 3 (three) times daily as needed.   30 tablet   0   . predniSONE (DELTASONE) 10 MG tablet      Take 50 mg for 2 days then 40mg  for 2 days then 20mg  for 2 days then STOP   15 tablet   0    BP 117/79  Pulse 64  Temp(Src) 99.1 F (37.3 C) (Oral)  Resp 16  SpO2 99%  LMP 06/06/2013 Vitals reviewed Physical Exam Physical Examination: General appearance - alert, well appearing, and in no distress Mental status - alert, oriented to person, place, and time  Eyes - pupils equal and reactive, extraocular eye movements intact Mouth - mucous membranes moist, pharynx normal without lesions Neck - supple, no significant adenopathy Chest - clear to auscultation, no wheezes, rales or rhonchi, symmetric air entry Heart - normal rate, regular rhythm, normal S1, S2, no murmurs, rubs, clicks or gallops Abdomen - soft, nontender, nondistended, no masses or organomegaly Neurological - alert, oriented x 3, cranial nerves 2-12 tested and intact, strength 5/5 in extremities x 4, sensation intact, heel to shin normal bilaterally, dix-hall-pike positive for reproduction of symptoms with nystagmus Extremities - peripheral pulses normal, no pedal edema, no clubbing or cyanosis Skin - normal coloration and turgor, no rashes  ED Course  Procedures (including critical care time)  2:27 PM on recheck symptoms are much improved after meclizine.   Labs Review Labs  Reviewed  GLUCOSE, CAPILLARY - Abnormal; Notable for the following:    Glucose-Capillary 126 (*)    All other components within normal limits  CBC - Abnormal; Notable for the following:    Hemoglobin 11.8 (*)    HCT 35.2 (*)    All other components within normal limits  BASIC METABOLIC PANEL - Abnormal; Notable for the following:    Glucose, Bld 104 (*)    All other components within normal limits   Imaging Review Ct Head Wo Contrast  07/26/2013   CLINICAL DATA:  Dizziness  EXAM: CT HEAD WITHOUT CONTRAST  TECHNIQUE: Contiguous axial images were obtained from the base of the skull through the vertex without intravenous contrast.  COMPARISON:  03/15/2012  FINDINGS: Normal ventricular morphology.  No midline shift or mass effect.  Few scattered nonspecific areas of white matter hypoattenuation stable since previous exam.  No intracranial hemorrhage, mass lesion or evidence acute infarction.  No extra-axial fluid collections.  Bones and sinuses unremarkable.  IMPRESSION: Minimal nonspecific white matter changes stable since previous exam of 2013, most prominent at the left parietal lobe.  No definite acute intracranial abnormalities.   Electronically Signed   By: Ulyses Southward M.D.   On: 07/26/2013 13:28    EKG Interpretation   None       MDM   1. Vertigo    Pt presenting with acute onset of vertigo associated with nausea/vomiting.  Normal neuro exam, positive dix-hall- pike, CT scan reassuring.  Pt feels much improved after meclizine with resolution of her symptoms.  Suspect BPPV.  Discharged with strict return precautions.  Pt agreeable with plan.    Ethelda Chick, MD 07/26/13 [phone]

## 2013-10-07 ENCOUNTER — Encounter (HOSPITAL_COMMUNITY): Payer: Self-pay | Admitting: Emergency Medicine

## 2013-10-07 ENCOUNTER — Emergency Department (INDEPENDENT_AMBULATORY_CARE_PROVIDER_SITE_OTHER)
Admission: EM | Admit: 2013-10-07 | Discharge: 2013-10-07 | Disposition: A | Discharge status: Home / Self Care | Payer: PRIVATE HEALTH INSURANCE | Source: Home / Self Care | Attending: Family Medicine | Admitting: Family Medicine

## 2013-10-07 DIAGNOSIS — K0889 Other specified disorders of teeth and supporting structures: Secondary | ICD-10-CM

## 2013-10-07 DIAGNOSIS — A084 Viral intestinal infection, unspecified: Secondary | ICD-10-CM

## 2013-10-07 DIAGNOSIS — K089 Disorder of teeth and supporting structures, unspecified: Secondary | ICD-10-CM

## 2013-10-07 DIAGNOSIS — A088 Other specified intestinal infections: Secondary | ICD-10-CM

## 2013-10-07 MED ORDER — ONDANSETRON 4 MG PO TBDP
8.0000 mg | ORAL_TABLET | Freq: Once | ORAL | Status: AC
  Administered 2013-10-07: 8 mg via ORAL

## 2013-10-07 MED ORDER — ONDANSETRON 4 MG PO TBDP
ORAL_TABLET | ORAL | Status: AC
  Filled 2013-10-07: qty 2

## 2013-10-07 MED ORDER — AMOXICILLIN 500 MG PO CAPS: 500.0000 mg | ORAL_CAPSULE | Freq: Three times a day (TID) | ORAL | Status: DC

## 2013-10-07 MED ORDER — ONDANSETRON HCL 8 MG PO TABS: 8.0000 mg | ORAL_TABLET | Freq: Three times a day (TID) | ORAL | Status: DC | PRN

## 2013-10-07 MED ORDER — TRAMADOL HCL 50 MG PO TABS: 50.0000 mg | ORAL_TABLET | Freq: Four times a day (QID) | ORAL | Status: DC | PRN

## 2013-10-07 NOTE — Discharge Instructions (Signed)
Thank you for coming in today. Take the Zofran as needed for vomiting Take amoxicillin to combat tooth infection  Use tramadol for severe pain Followup with a dentist as soon as possible  Please call Dr. Lawrence Marseillesivils office [phone] or cell [phone] [address], [place]  Cost for tooth removal $200 includes exam, Xray, and extraction and follow up visit.  Bring list of current medications with you.   Please call [facility] at [phone] to get the details to get your tooth pulled.

## 2013-10-07 NOTE — ED Provider Notes (Signed)
Wendy HawkingLynette M Saari is a 43 y.o. female who presents to Urgent Care today for tooth pain and vomiting. 1) patient has had 2 days of upper left tooth pain. She's tried to followup in [facility] however they are not open when she is off work for the next several days. The pain is severe and worse with chewing. No fevers or chills. 2) patient also notes nausea vomiting and diarrhea starting one day ago. She denies abdominal pain. She denies any blood in her vomit or diarrhea. She feels well otherwise.   Past Medical History  Diagnosis Date  . Asthma   . Seasonal allergies   . Arthritis    History  Substance Use Topics  . Smoking status: Former Games developermoker  . Smokeless tobacco: Never Used  . Alcohol Use: No   ROS as above Medications: No current facility-administered medications for this encounter.   Current Outpatient Prescriptions  Medication Sig Dispense Refill  . albuterol (PROVENTIL HFA;VENTOLIN HFA) 108 (90 BASE) MCG/ACT inhaler Inhale 2 puffs into the lungs every 4 (four) hours as needed for wheezing. For shortness of breath and wheezing  1 Inhaler  1  . albuterol (PROVENTIL) (2.5 MG/3ML) 0.083% nebulizer solution Take 3 mLs (2.5 mg total) by nebulization every 6 (six) hours as needed for wheezing. For shortness of breath  75 mL  1  . amoxicillin (AMOXIL) 500 MG capsule Take 1 capsule (500 mg total) by mouth 3 (three) times daily.  60 capsule  0  . budesonide-formoterol (SYMBICORT) 80-4.5 MCG/ACT inhaler Inhale 2 puffs into the lungs 2 (two) times daily.  1 Inhaler  1  . diphenhydrAMINE (BENADRYL) 25 MG tablet Take 25 mg by mouth every 6 (six) hours as needed for allergies.      . Fluticasone-Salmeterol (ADVAIR DISKUS) 500-50 MCG/DOSE AEPB Inhale 1 puff into the lungs every 12 (twelve) hours.  60 each  1  . meclizine (ANTIVERT) 50 MG tablet Take 0.5 tablets (25 mg total) by mouth 3 (three) times daily as needed.  30 tablet  0  . ondansetron (ZOFRAN) 8 MG tablet Take 1 tablet (8 mg  total) by mouth every 8 (eight) hours as needed for nausea or vomiting.  20 tablet  0  . traMADol (ULTRAM) 50 MG tablet Take 1 tablet (50 mg total) by mouth every 6 (six) hours as needed.  15 tablet  0    Exam:  BP 140/99  Pulse 71  Temp(Src) 98.5 F (36.9 C) (Oral)  Resp 16  SpO2 100% Gen: Well NAD HEENT: EOMI,  MMM poor dentition. Upper left tooth is broken with abdominal and erythema without abscess. Tender to touch. Lungs: Normal work of breathing. CTABL Heart: RRR no MRG Abd: NABS, Soft. NT, ND, no rebound or guarding Exts: Brisk capillary refill, warm and well perfused.   Dental injection: Consent obtained Topical numbing medicine applied to the base of the tooth 1.8 mL of Marcaine and epinephrine were injected into the base of the tooth at the junction of the gum and cheek achieving good anesthesia. Patient tolerated procedure well.   Assessment and Plan: 43 y.o. female with  1) dental pain: Likely dental infection. Plan to treat with amoxicillin and tramadol. Dental Block today 2) vomiting: Viral gastroenteritis. Plan to treat with Zofran. Watchful waiting  Discussed warning signs or symptoms. Please see discharge instructions. Patient expresses understanding.    Rodolph BongEvan S Izzabelle Bouley, MD 10/07/13 1537

## 2013-10-07 NOTE — ED Notes (Signed)
C/o general body aches, nausea, diarrhea, toothache

## 2013-10-21 ENCOUNTER — Encounter (HOSPITAL_COMMUNITY): Payer: Self-pay | Admitting: Family Medicine

## 2013-10-21 ENCOUNTER — Emergency Department (INDEPENDENT_AMBULATORY_CARE_PROVIDER_SITE_OTHER)
Admission: EM | Admit: 2013-10-21 | Discharge: 2013-10-21 | Disposition: A | Discharge status: Home / Self Care | Payer: Self-pay | Source: Home / Self Care

## 2013-10-21 DIAGNOSIS — L0390 Cellulitis, unspecified: Secondary | ICD-10-CM

## 2013-10-21 DIAGNOSIS — B353 Tinea pedis: Secondary | ICD-10-CM

## 2013-10-21 MED ORDER — SULFAMETHOXAZOLE-TMP DS 800-160 MG PO TABS: 1.0000 | ORAL_TABLET | Freq: Two times a day (BID) | ORAL | Status: DC

## 2013-10-21 MED ORDER — TERBINAFINE HCL 250 MG PO TABS: 250.0000 mg | ORAL_TABLET | Freq: Every day | ORAL | Status: DC

## 2013-10-21 MED ORDER — TRAMADOL HCL 50 MG PO TABS: 50.0000 mg | ORAL_TABLET | Freq: Four times a day (QID) | ORAL | Status: DC | PRN

## 2013-10-21 NOTE — Discharge Instructions (Signed)
You have a siginificant foot infection called cellulitis. This is likely made worse by a foot fungal infection Please start the bactrim immediately to kill the bacterial infection Please start the terbinafine for the fungal infection.  Please come back if you are not better in 2-3 days as you may need different antibiotics Cellulitis Cellulitis is an infection of the skin and the tissue beneath it. The infected area is usually red and tender. Cellulitis occurs most often in the arms and lower legs.  CAUSES  Cellulitis is caused by bacteria that enter the skin through cracks or cuts in the skin. The most common types of bacteria that cause cellulitis are Staphylococcus and Streptococcus. SYMPTOMS   Redness and warmth.  Swelling.  Tenderness or pain.  Fever. DIAGNOSIS  Your caregiver can usually determine what is wrong based on a physical exam. Blood tests may also be done. TREATMENT  Treatment usually involves taking an antibiotic medicine. HOME CARE INSTRUCTIONS   Take your antibiotics as directed. Finish them even if you start to feel better.  Keep the infected arm or leg elevated to reduce swelling.  Apply a warm cloth to the affected area up to 4 times per day to relieve pain.  Only take over-the-counter or prescription medicines for pain, discomfort, or fever as directed by your caregiver.  Keep all follow-up appointments as directed by your caregiver. SEEK MEDICAL CARE IF:   You notice red streaks coming from the infected area.  Your red area gets larger or turns dark in color.  Your bone or joint underneath the infected area becomes painful after the skin has healed.  Your infection returns in the same area or another area.  You notice a swollen bump in the infected area.  You develop new symptoms. SEEK IMMEDIATE MEDICAL CARE IF:   You have a fever.  You feel very sleepy.  You develop vomiting or diarrhea.  You have a general ill feeling (malaise) with  muscle aches and pains. MAKE SURE YOU:   Understand these instructions.  Will watch your condition.  Will get help right away if you are not doing well or get worse. Document Released: 04/25/2005 Document Revised: 01/15/2012 Document Reviewed: 10/01/2011 [facility] Patient Information 2014 [employer] [place].

## 2013-10-21 NOTE — ED Notes (Signed)
Right foot pain, onset last night.  Patient has redness swelling, and pain in right ankle/foot, particularly around heel

## 2013-10-21 NOTE — ED Provider Notes (Signed)
CSN: [id number]     Arrival date & time 10/21/13  1638 History   None    Chief Complaint  Patient presents with  . Foot Pain   (Consider location/radiation/quality/duration/timing/severity/associated sxs/prior Treatment) HPI  R foot pain: comes and goes for 1 year. Current episode started a few days ago. Located along back and sides of heel. Sharp pain. Waking up at night. Getting worse. Tried advil w/o benefit. Tried itch cream w/o benefit. Denies trauma. Pussy discharge.  Last A1c 5.7 12/2012.   Tooth infection: much improved after starting ABX. Awaiting dental appt.    Past Medical History  Diagnosis Date  . Asthma   . Seasonal allergies   . Arthritis    Past Surgical History  Procedure Laterality Date  . Cholecystectomy    . Cesarean section    . Tubal ligation     Family History  Problem Relation Age of Onset  . Hypertension Mother   . Diabetes Mother   . Cancer Mother    History  Substance Use Topics  . Smoking status: Former Games developer  . Smokeless tobacco: Never Used  . Alcohol Use: No   OB History   Grav Para Term Preterm Abortions TAB SAB Ect Mult Living   5 4 4  0 1 0 1 0 0 3     Review of Systems  Constitutional: Negative for fever and fatigue.  Skin: Positive for rash and wound.  All other systems reviewed and are negative.    Allergies  Review of patient's allergies indicates no known allergies.  Home Medications   Current Outpatient Rx  Name  Route  Sig  Dispense  Refill  . albuterol (PROVENTIL HFA;VENTOLIN HFA) 108 (90 BASE) MCG/ACT inhaler   Inhalation   Inhale 2 puffs into the lungs every 4 (four) hours as needed for wheezing. For shortness of breath and wheezing   1 Inhaler   1   . albuterol (PROVENTIL) (2.5 MG/3ML) 0.083% nebulizer solution   Nebulization   Take 3 mLs (2.5 mg total) by nebulization every 6 (six) hours as needed for wheezing. For shortness of breath   75 mL   1   . amoxicillin (AMOXIL) 500 MG capsule   Oral   Take  1 capsule (500 mg total) by mouth 3 (three) times daily.   60 capsule   0   . budesonide-formoterol (SYMBICORT) 80-4.5 MCG/ACT inhaler   Inhalation   Inhale 2 puffs into the lungs 2 (two) times daily.   1 Inhaler   1   . diphenhydrAMINE (BENADRYL) 25 MG tablet   Oral   Take 25 mg by mouth every 6 (six) hours as needed for allergies.         . Fluticasone-Salmeterol (ADVAIR DISKUS) 500-50 MCG/DOSE AEPB   Inhalation   Inhale 1 puff into the lungs every 12 (twelve) hours.   60 each   1   . meclizine (ANTIVERT) 50 MG tablet   Oral   Take 0.5 tablets (25 mg total) by mouth 3 (three) times daily as needed.   30 tablet   0   . ondansetron (ZOFRAN) 8 MG tablet   Oral   Take 1 tablet (8 mg total) by mouth every 8 (eight) hours as needed for nausea or vomiting.   20 tablet   0   . sulfamethoxazole-trimethoprim (BACTRIM DS) 800-160 MG per tablet   Oral   Take 1 tablet by mouth 2 (two) times daily.   20 tablet   0   .  terbinafine (LAMISIL) 250 MG tablet   Oral   Take 1 tablet (250 mg total) by mouth daily.   14 tablet   0   . traMADol (ULTRAM) 50 MG tablet   Oral   Take 1 tablet (50 mg total) by mouth every 6 (six) hours as needed.   15 tablet   0   . traMADol (ULTRAM) 50 MG tablet   Oral   Take 1 tablet (50 mg total) by mouth every 6 (six) hours as needed.   15 tablet   0    BP 161/100  Pulse 73  Temp(Src) 97.5 F (36.4 C) (Oral)  Resp 18  SpO2 98% Physical Exam  Constitutional: She appears well-developed and well-nourished. No distress.  HENT:  Head: Normocephalic and atraumatic.  Eyes: EOM are normal. Pupils are equal, round, and reactive to light.  Neck: Normal range of motion. No JVD present.  Pulmonary/Chest: Effort normal. No respiratory distress.  Abdominal: Soft.  Musculoskeletal: Normal range of motion. She exhibits no edema.  Neurological: She is alert.  Skin: She is not diaphoretic.  R ankle w/ lesion extending 3cm proximal/distally and  extending laterall/posterior/medially 9cm. Various areas of maceration, dry flaky skin edges. surounding erythema and cellulitis  Psychiatric: She has a normal mood and affect. Her behavior is normal. Judgment and thought content normal.    ED Course  Procedures (including critical care time) Labs Review Labs Reviewed - No data to display Imaging Review No results found.   MDM   1. Cellulitis   2. 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 Pt currently on AMox w/o benefit makes MRSA more likely . Will change ABX to Bactrim. Start Terbinafine for fungal (liver fxn nml). Tramadol prn pain. CUlture sent as pseudo is still a concern as pt is pre-dm. Pt w/ excellent pedal pulses so pt should have excellent wound healing. Pt to call/ come in if no improvement in 2-3 days for broader ABX (pseudo coverage) - precautions given  Shelly Flattenavid Merrell, MD Family Medicine PGY-3 10/21/2013, 6:43 PM    Ozella Rocksavid J Merrell, MD 10/21/13 [phone]

## 2013-10-22 NOTE — ED Provider Notes (Signed)
Medical screening examination/treatment/procedure(s) were performed by a resident physician or non-physician practitioner and as the supervising physician I was immediately available for consultation/collaboration.  Jaylia Pettus, MD    Lylee Corrow S Daesia Zylka, MD 10/22/13 1818 

## 2013-10-24 LAB — WOUND CULTURE: GRAM STAIN: NONE SEEN

## 2013-10-24 NOTE — ED Notes (Signed)
Wound culture R heel: Mod. Staph. Aureus.  Pt. adequately treated with Bactrim DS. Vassie MoselleYork, Diantha Paxson M 10/24/2013

## 2013-11-03 NOTE — ED Notes (Signed)
Pt called  Reporting  Still  Having  Symptoms   With  Foot       Pt  Advised  To  Follow  Plan of  Care  And  To  Return  As  Advised

## 2013-11-15 ENCOUNTER — Emergency Department (INDEPENDENT_AMBULATORY_CARE_PROVIDER_SITE_OTHER)
Admission: EM | Admit: 2013-11-15 | Discharge: 2013-11-15 | Disposition: A | Discharge status: Home / Self Care | Payer: PRIVATE HEALTH INSURANCE | Source: Home / Self Care | Attending: Emergency Medicine | Admitting: Emergency Medicine

## 2013-11-15 ENCOUNTER — Encounter (HOSPITAL_COMMUNITY): Payer: Self-pay | Admitting: Emergency Medicine

## 2013-11-15 DIAGNOSIS — J45901 Unspecified asthma with (acute) exacerbation: Secondary | ICD-10-CM

## 2013-11-15 MED ORDER — PREDNISONE 20 MG PO TABS: ORAL_TABLET | ORAL | Status: DC

## 2013-11-15 MED ORDER — IPRATROPIUM BROMIDE 0.02 % IN SOLN
RESPIRATORY_TRACT | Status: AC
  Filled 2013-11-15: qty 2.5

## 2013-11-15 MED ORDER — METHYLPREDNISOLONE SODIUM SUCC 125 MG IJ SOLR
125.0000 mg | Freq: Once | INTRAMUSCULAR | Status: AC
  Administered 2013-11-15: 125 mg via INTRAMUSCULAR

## 2013-11-15 MED ORDER — BECLOMETHASONE DIPROPIONATE 80 MCG/ACT IN AERS: 2.0000 | INHALATION_SPRAY | Freq: Two times a day (BID) | RESPIRATORY_TRACT | Status: DC

## 2013-11-15 MED ORDER — ALBUTEROL SULFATE (2.5 MG/3ML) 0.083% IN NEBU
5.0000 mg | INHALATION_SOLUTION | Freq: Once | RESPIRATORY_TRACT | Status: AC
  Administered 2013-11-15: 5 mg via RESPIRATORY_TRACT

## 2013-11-15 MED ORDER — EPINEPHRINE HCL 1 MG/ML IJ SOLN
0.3000 mg | Freq: Once | INTRAMUSCULAR | Status: AC
  Administered 2013-11-15: 0.3 mg via INTRAMUSCULAR

## 2013-11-15 MED ORDER — IPRATROPIUM-ALBUTEROL 0.5-2.5 (3) MG/3ML IN SOLN
3.0000 mL | Freq: Once | RESPIRATORY_TRACT | Status: AC
  Administered 2013-11-15: 3 mL via RESPIRATORY_TRACT

## 2013-11-15 MED ORDER — ALBUTEROL SULFATE HFA 108 (90 BASE) MCG/ACT IN AERS: 2.0000 | INHALATION_SPRAY | RESPIRATORY_TRACT | Status: DC | PRN

## 2013-11-15 MED ORDER — ALBUTEROL SULFATE (2.5 MG/3ML) 0.083% IN NEBU
INHALATION_SOLUTION | RESPIRATORY_TRACT | Status: AC
  Filled 2013-11-15: qty 6

## 2013-11-15 NOTE — Discharge Instructions (Signed)

## 2013-11-15 NOTE — ED Provider Notes (Signed)
Chief Complaint   Chief Complaint  Patient presents with  . Asthma    History of Present Illness   Wendy James is a 43 year old female with a history of asthma for years. Her current attack again around 4 AM today. There is no obvious precipitating factor. She's had coughing, wheezing, and chest tightness. Denies any fever, headache, nasal congestion, rhinorrhea, or sore throat. The patient has no primary care physician. She has been hospitalized many times before and has been on a ventilator once. She's been to the emergency room once in the past year and has had several courses of by mouth prednisone. She is on albuterol by nebulizer and MDI inhaler but no other medications.  Review of Systems   Other than as noted above, the patient denies any of the following symptoms. Systemic:  No fever, chills, or sweats. ENT:  No nasal congestion, sneezing, rhinorrhea, or sore throat. Lungs:  No cough, sputum production, or shortness of breath. No chest pain. Skin:  No rash or itching.  PMFSH   Past medical history, family history, social history, meds, and allergies were reviewed.   Physical Examination    Vital signs:  BP 158/110  Pulse 77  Temp(Src) 99.6 F (37.6 C) (Oral)  Resp 28  SpO2 97% General:  Alert, in considerable respiratory distress initially with use of accessory muscles, labored breathing, audible wheezes, and is only able to speak in short phrases. Eye:  No conjunctival injection or drainage. Lids were normal. ENT:  TMs and canals were normal, without erythema or inflammation.  Nasal mucosa was clear and uncongested, without drainage.  Mucous membranes were moist.  Pharynx was clear, without exudate or drainage.  There were no oral ulcerations or lesions. Neck:  Supple, no adenopathy, tenderness or mass. Lungs:  No retractions but does have use of accessory muscles, labored breathing, tachypnea, audible wheezes, and only able to speak in short phrases. She has  bilateral expiratory wheezes with poor air movement initially, no rales or rhonchi. Heart:  Regular rhythm, without gallops, murmers or rubs. Skin:  Clear, warm, and dry, without rash or lesions.  Course in Urgent Care Center   She was immediately given epinephrine 0.3 mL IM, Depo-Medrol 125 mg IM, and a DuoNeb breathing treatment. After the treatment she did not appear to be in any respiratory distress, her lungs were completely clear and wheeze free. The patient states that she still feels a little bit tight, so she was given another DuoNeb breathing treatment and after that states that her lungs were clear and she was not have any difficulty breathing. On auscultation her lungs were completely clear with good air movement and no wheezes.  Assessment   The encounter diagnosis was Asthma attack.  Improved after treatment. Given her severe history, I think she will need a pulmonary specialist and she was referred to Dr. Delton CoombesByrum.  Plan    1.  Meds:  The following meds were prescribed:   Discharge Medication List as of 11/15/2013 11:35 AM    START taking these medications   Details  !! albuterol (PROVENTIL HFA;VENTOLIN HFA) 108 (90 BASE) MCG/ACT inhaler Inhale 2 puffs into the lungs every 4 (four) hours as needed for wheezing or shortness of breath., Starting 11/15/2013, Until Discontinued, Normal    beclomethasone (QVAR) 80 MCG/ACT inhaler Inhale 2 puffs into the lungs 2 (two) times daily., Starting 11/15/2013, Until Discontinued, Normal    predniSONE (DELTASONE) 20 MG tablet Take 3 daily for 5 days, 2 daily for  5 days, 1 daily for 5 days., Normal     !! - Potential duplicate medications found. Please discuss with provider.      2.  Patient Education/Counseling:  The patient was given appropriate handouts, self care instructions, and instructed in symptomatic relief.    3.  Follow up:  The patient was told to follow up here if no better in 2 days, or sooner if becoming worse in any way,  and given some red flag symptoms such as increasing difficulty breathing which would prompt immediate return.         Reuben Likesavid C Gervis Gaba, MD 11/15/13 2103

## 2013-11-15 NOTE — ED Notes (Signed)
Some improvement w work of breathing  noted after first treatment , but still wheezing audibly

## 2013-11-15 NOTE — ED Notes (Signed)
C/o onset of syx last PM. Audible wheezing , accessory muscle use

## 2013-11-17 ENCOUNTER — Encounter (HOSPITAL_COMMUNITY): Payer: Self-pay | Admitting: Emergency Medicine

## 2013-11-17 ENCOUNTER — Inpatient Hospital Stay (HOSPITAL_COMMUNITY)
Admission: EM | Admit: 2013-11-17 | Discharge: 2013-11-21 | DRG: 203 | Disposition: A | Discharge status: Home / Self Care | Payer: PRIVATE HEALTH INSURANCE | Attending: Internal Medicine | Admitting: Internal Medicine

## 2013-11-17 ENCOUNTER — Emergency Department (HOSPITAL_COMMUNITY): Payer: PRIVATE HEALTH INSURANCE

## 2013-11-17 DIAGNOSIS — Z79899 Other long term (current) drug therapy: Secondary | ICD-10-CM

## 2013-11-17 DIAGNOSIS — D649 Anemia, unspecified: Secondary | ICD-10-CM

## 2013-11-17 DIAGNOSIS — J45901 Unspecified asthma with (acute) exacerbation: Principal | ICD-10-CM

## 2013-11-17 DIAGNOSIS — D6489 Other specified anemias: Secondary | ICD-10-CM | POA: Diagnosis present

## 2013-11-17 DIAGNOSIS — Z87891 Personal history of nicotine dependence: Secondary | ICD-10-CM

## 2013-11-17 DIAGNOSIS — Z833 Family history of diabetes mellitus: Secondary | ICD-10-CM

## 2013-11-17 DIAGNOSIS — N920 Excessive and frequent menstruation with regular cycle: Secondary | ICD-10-CM | POA: Diagnosis present

## 2013-11-17 DIAGNOSIS — Z8249 Family history of ischemic heart disease and other diseases of the circulatory system: Secondary | ICD-10-CM

## 2013-11-17 LAB — RETICULOCYTES
RBC.: 3.82 MIL/uL — AB (ref 3.87–5.11)
Retic Count, Absolute: 53.5 10*3/uL (ref 19.0–186.0)
Retic Ct Pct: 1.4 % (ref 0.4–3.1)

## 2013-11-17 LAB — PREGNANCY, URINE: Preg Test, Ur: NEGATIVE

## 2013-11-17 LAB — CBC WITH DIFFERENTIAL/PLATELET
Basophils Absolute: 0 10*3/uL (ref 0.0–0.1)
Basophils Relative: 1 % (ref 0–1)
EOS ABS: 0.1 10*3/uL (ref 0.0–0.7)
Eosinophils Relative: 2 % (ref 0–5)
HCT: 33.3 % — ABNORMAL LOW (ref 36.0–46.0)
HEMOGLOBIN: 10.7 g/dL — AB (ref 12.0–15.0)
LYMPHS ABS: 2.3 10*3/uL (ref 0.7–4.0)
Lymphocytes Relative: 37 % (ref 12–46)
MCH: 27.7 pg (ref 26.0–34.0)
MCHC: 32.1 g/dL (ref 30.0–36.0)
MCV: 86.3 fL (ref 78.0–100.0)
MONOS PCT: 5 % (ref 3–12)
Monocytes Absolute: 0.3 10*3/uL (ref 0.1–1.0)
Neutro Abs: 3.5 10*3/uL (ref 1.7–7.7)
Neutrophils Relative %: 56 % (ref 43–77)
Platelets: 199 10*3/uL (ref 150–400)
RBC: 3.86 MIL/uL — ABNORMAL LOW (ref 3.87–5.11)
RDW: 14.9 % (ref 11.5–15.5)
WBC: 6.2 10*3/uL (ref 4.0–10.5)

## 2013-11-17 LAB — BASIC METABOLIC PANEL
BUN: 13 mg/dL (ref 6–23)
CO2: 22 meq/L (ref 19–32)
CREATININE: 0.63 mg/dL (ref 0.50–1.10)
Calcium: 9 mg/dL (ref 8.4–10.5)
Chloride: 102 mEq/L (ref 96–112)
GFR calc non Af Amer: >90 mL/min (ref >90)
Glucose, Bld: 109 mg/dL — ABNORMAL HIGH (ref 70–99)
Potassium: 3.7 mEq/L (ref 3.7–5.3)
SODIUM: 137 meq/L (ref 137–147)

## 2013-11-17 MED ORDER — DEXTROSE 5 % IV SOLN
500.0000 mg | INTRAVENOUS | Status: DC
  Administered 2013-11-17: 500 mg via INTRAVENOUS
  Filled 2013-11-17: qty 500

## 2013-11-17 MED ORDER — ALBUTEROL (5 MG/ML) CONTINUOUS INHALATION SOLN
10.0000 mg/h | INHALATION_SOLUTION | RESPIRATORY_TRACT | Status: AC
  Administered 2013-11-17: 10 mg/h via RESPIRATORY_TRACT
  Filled 2013-11-17: qty 20

## 2013-11-17 MED ORDER — ENOXAPARIN SODIUM 40 MG/0.4ML ~~LOC~~ SOLN
40.0000 mg | Freq: Every day | SUBCUTANEOUS | Status: DC
  Administered 2013-11-17 – 2013-11-20 (×4): 40 mg via SUBCUTANEOUS
  Filled 2013-11-17 (×5): qty 0.4

## 2013-11-17 MED ORDER — ACETAMINOPHEN 325 MG PO TABS
650.0000 mg | ORAL_TABLET | Freq: Once | ORAL | Status: AC
  Administered 2013-11-17: 650 mg via ORAL
  Filled 2013-11-17: qty 2

## 2013-11-17 MED ORDER — ACETAMINOPHEN 325 MG PO TABS: 650.0000 mg | ORAL_TABLET | Freq: Four times a day (QID) | ORAL | Status: DC | PRN

## 2013-11-17 MED ORDER — ACETAMINOPHEN 650 MG RE SUPP: 650.0000 mg | Freq: Four times a day (QID) | RECTAL | Status: DC | PRN

## 2013-11-17 MED ORDER — SODIUM CHLORIDE 0.9 % IJ SOLN
3.0000 mL | Freq: Two times a day (BID) | INTRAMUSCULAR | Status: DC
  Administered 2013-11-20: 3 mL via INTRAVENOUS

## 2013-11-17 MED ORDER — ALBUTEROL SULFATE (2.5 MG/3ML) 0.083% IN NEBU: 2.5000 mg | INHALATION_SOLUTION | RESPIRATORY_TRACT | Status: DC | PRN

## 2013-11-17 MED ORDER — ALBUTEROL SULFATE (2.5 MG/3ML) 0.083% IN NEBU: 5.0000 mg | INHALATION_SOLUTION | RESPIRATORY_TRACT | Status: AC | PRN

## 2013-11-17 MED ORDER — ONDANSETRON HCL 4 MG PO TABS: 4.0000 mg | ORAL_TABLET | Freq: Four times a day (QID) | ORAL | Status: DC | PRN

## 2013-11-17 MED ORDER — ALBUTEROL SULFATE (2.5 MG/3ML) 0.083% IN NEBU
2.5000 mg | INHALATION_SOLUTION | Freq: Four times a day (QID) | RESPIRATORY_TRACT | Status: DC
  Administered 2013-11-18 – 2013-11-20 (×9): 2.5 mg via RESPIRATORY_TRACT
  Filled 2013-11-17 (×9): qty 3

## 2013-11-17 MED ORDER — METHYLPREDNISOLONE SODIUM SUCC 40 MG IJ SOLR
40.0000 mg | Freq: Two times a day (BID) | INTRAMUSCULAR | Status: DC
  Administered 2013-11-17: 40 mg via INTRAVENOUS
  Filled 2013-11-17 (×3): qty 1

## 2013-11-17 MED ORDER — ALBUTEROL SULFATE (2.5 MG/3ML) 0.083% IN NEBU
2.5000 mg | INHALATION_SOLUTION | Freq: Four times a day (QID) | RESPIRATORY_TRACT | Status: DC | PRN
  Administered 2013-11-18 – 2013-11-19 (×2): 2.5 mg via RESPIRATORY_TRACT
  Filled 2013-11-17 (×2): qty 3

## 2013-11-17 MED ORDER — TRAMADOL HCL 50 MG PO TABS
50.0000 mg | ORAL_TABLET | Freq: Four times a day (QID) | ORAL | Status: DC | PRN
  Administered 2013-11-17 – 2013-11-19 (×4): 50 mg via ORAL
  Filled 2013-11-17 (×4): qty 1

## 2013-11-17 MED ORDER — ALBUTEROL SULFATE (2.5 MG/3ML) 0.083% IN NEBU
2.5000 mg | INHALATION_SOLUTION | RESPIRATORY_TRACT | Status: DC
  Administered 2013-11-17: 2.5 mg via RESPIRATORY_TRACT
  Filled 2013-11-17: qty 3

## 2013-11-17 MED ORDER — ONDANSETRON HCL 4 MG/2ML IJ SOLN: 4.0000 mg | Freq: Four times a day (QID) | INTRAMUSCULAR | Status: DC | PRN

## 2013-11-17 MED ORDER — SODIUM CHLORIDE 0.9 % IJ SOLN
3.0000 mL | Freq: Two times a day (BID) | INTRAMUSCULAR | Status: DC
  Administered 2013-11-17: 3 mL via INTRAVENOUS

## 2013-11-17 MED ORDER — BUDESONIDE 0.25 MG/2ML IN SUSP
0.2500 mg | Freq: Two times a day (BID) | RESPIRATORY_TRACT | Status: DC
  Administered 2013-11-17 – 2013-11-21 (×8): 0.25 mg via RESPIRATORY_TRACT
  Filled 2013-11-17 (×17): qty 2

## 2013-11-17 NOTE — ED Notes (Signed)
Pt arrives via EMS with c/o  wheezing since Sunday, went to urgent care on Sunday and received nebs and solumedrol at that time. EMS gave 10 MG albuterol, 0.5mg  atrovent, 125mg  solumedrol. Vomited prior to EMS arrival, not nauseous. 22G  Left hand, slight exp wheezing in lower bases. Last set EMS VS 99%, 129/79, 76 NSR.

## 2013-11-17 NOTE — ED Provider Notes (Signed)
Medical screening examination/treatment/procedure(s) were performed by non-physician practitioner and as supervising physician I was immediately available for consultation/collaboration.   EKG Interpretation None       Courtney F Horton, MD 11/17/13 2354 

## 2013-11-17 NOTE — ED Notes (Signed)
Bed: WA17 Expected date:  Expected time:  Means of arrival:  Comments: ems- asthma

## 2013-11-17 NOTE — ED Notes (Signed)
Pt states s/s started with asthma attack, states got worse on Sunday, urgent care provider wanted to admit pt according to pt

## 2013-11-17 NOTE — ED Provider Notes (Signed)
CSN: [id number]     Arrival date & time 11/17/13  1730 History   First MD Initiated Contact with Patient 11/17/13 1739     Chief Complaint  Patient presents with  . Shortness of Breath     (Consider location/radiation/quality/duration/timing/severity/associated sxs/prior Treatment) Patient is a 43 y.o. female presenting with shortness of breath. The history is provided by the patient and medical records.  Shortness of Breath Associated symptoms: cough and wheezing    This is a 43 year old female with past medical history significant for asthma, seasonal allergies, arthritis, presenting to the ED for shortness of breath. Patient states she has had increased wheezing since Sunday, was seen in urgent care and given several nebulizer treatments and Solu-Medrol and was discharged home. She states she initially started feeling better, but shortness of breath returned earlier this morning.  She does admit to a nonproductive cough, but denies fever or chills.  Patient has been hospitalized in the past for asthma, and has been intubated once.  Patient endorses some chest tightness, normal with her asthma exacerbation. Denies any palpitations, dizziness, weakness, or diaphoresis.  Patient given 2 neb treatments and Solu-Medrol by EMS, states she continues to feel short of breath with audible wheezing.  VS stable on arrival.  Past Medical History  Diagnosis Date  . Asthma   . Seasonal allergies   . Arthritis    Past Surgical History  Procedure Laterality Date  . Cholecystectomy    . Cesarean section    . Tubal ligation     Family History  Problem Relation Age of Onset  . Hypertension Mother   . Diabetes Mother   . Cancer Mother    History  Substance Use Topics  . Smoking status: Former Games developermoker  . Smokeless tobacco: Never Used  . Alcohol Use: No   OB History   Grav Para Term Preterm Abortions TAB SAB Ect Mult Living   5 4 4  0 1 0 1 0 0 3     Review of Systems  Respiratory: Positive  for cough, shortness of breath and wheezing.   All other systems reviewed and are negative.     Allergies  Shrimp  Home Medications   Prior to Admission medications   Medication Sig Start Date End Date Taking? Authorizing Provider  albuterol (PROVENTIL HFA;VENTOLIN HFA) 108 (90 BASE) MCG/ACT inhaler Inhale 2 puffs into the lungs every 4 (four) hours as needed for wheezing. For shortness of breath and wheezing 03/15/13   Zannie CovePreetha Joseph, MD  albuterol (PROVENTIL HFA;VENTOLIN HFA) 108 (90 BASE) MCG/ACT inhaler Inhale 2 puffs into the lungs every 4 (four) hours as needed for wheezing or shortness of breath. 11/15/13   Reuben Likesavid C Keller, MD  albuterol (PROVENTIL) (2.5 MG/3ML) 0.083% nebulizer solution Take 3 mLs (2.5 mg total) by nebulization every 6 (six) hours as needed for wheezing. For shortness of breath 03/23/13   Richarda OverlieNayana Abrol, MD  amoxicillin (AMOXIL) 500 MG capsule Take 1 capsule (500 mg total) by mouth 3 (three) times daily. 10/07/13   Rodolph BongEvan S Corey, MD  beclomethasone (QVAR) 80 MCG/ACT inhaler Inhale 2 puffs into the lungs 2 (two) times daily. 11/15/13   Reuben Likesavid C Keller, MD  budesonide-formoterol [facility](SYMBICORT) 80-4.5 MCG/ACT inhaler Inhale 2 puffs into the lungs 2 (two) times daily. 03/15/13   Zannie CovePreetha Joseph, MD  diphenhydrAMINE (BENADRYL) 25 MG tablet Take 25 mg by mouth every 6 (six) hours as needed for allergies.    Historical Provider, MD  Fluticasone-Salmeterol (ADVAIR DISKUS) 500-50 MCG/DOSE AEPB Inhale  1 puff into the lungs every 12 (twelve) hours. 06/21/13   Graylon GoodZachary H Baker, PA-C  meclizine (ANTIVERT) 50 MG tablet Take 0.5 tablets (25 mg total) by mouth 3 (three) times daily as needed. 07/26/13   Ethelda ChickMartha K Linker, MD  ondansetron (ZOFRAN) 8 MG tablet Take 1 tablet (8 mg total) by mouth every 8 (eight) hours as needed for nausea or vomiting. 10/07/13   Rodolph BongEvan S Corey, MD  predniSONE (DELTASONE) 20 MG tablet Take 3 daily for 5 days, 2 daily for 5 days, 1 daily for 5 days. 11/15/13   Reuben Likesavid C Keller,  MD  sulfamethoxazole-trimethoprim (BACTRIM DS) 800-160 MG per tablet Take 1 tablet by mouth 2 (two) times daily. 10/21/13   Ozella Rocksavid J Merrell, MD  terbinafine (LAMISIL) 250 MG tablet Take 1 tablet (250 mg total) by mouth daily. 10/21/13   Ozella Rocksavid J Merrell, MD  traMADol (ULTRAM) 50 MG tablet Take 1 tablet (50 mg total) by mouth every 6 (six) hours as needed. 10/07/13   Rodolph BongEvan S Corey, MD  traMADol (ULTRAM) 50 MG tablet Take 1 tablet (50 mg total) by mouth every 6 (six) hours as needed. 10/21/13   Ozella Rocksavid J Merrell, MD   BP 130/78  Pulse 76  Temp(Src) 98.7 F (37.1 C) (Oral)  Resp 16  Ht 5\' 2"  (1.575 m)  Wt 175 lb (79.379 kg)  BMI 32.00 kg/m2  SpO2 96%  LMP 11/04/2013  Physical Exam  Nursing note and vitals reviewed. Constitutional: She is oriented to person, place, and time. She appears well-developed and well-nourished. No distress.  HENT:  Head: Normocephalic and atraumatic.  Mouth/Throat: Uvula is midline, oropharynx is clear and moist and mucous membranes are normal. No oropharyngeal exudate, posterior oropharyngeal edema, posterior oropharyngeal erythema or tonsillar abscesses.  Eyes: Conjunctivae and EOM are normal. Pupils are equal, round, and reactive to light.  Neck: Normal range of motion. Neck supple.  Cardiovascular: Normal rate, regular rhythm and normal heart sounds.   Pulmonary/Chest: Effort normal. No respiratory distress. She has wheezes. She has no rhonchi.  Slightly increased work of breathing, dffuse expiratory wheezes throughout, speaking in full complete sentences without difficulty  Abdominal: Soft. Bowel sounds are normal. There is no tenderness. There is no guarding.  Musculoskeletal: Normal range of motion. She exhibits no edema.  Neurological: She is alert and oriented to person, place, and time.  Skin: Skin is warm and dry. She is not diaphoretic.  Psychiatric: She has a normal mood and affect.    ED Course  Procedures (including critical care time) Labs  Review Labs Reviewed  CBC WITH DIFFERENTIAL - Abnormal; Notable for the following:    RBC 3.86 (*)    Hemoglobin 10.7 (*)    HCT 33.3 (*)    All other components within normal limits  BASIC METABOLIC PANEL - Abnormal; Notable for the following:    Glucose, Bld 109 (*)    All other components within normal limits  PREGNANCY, URINE    Imaging Review Dg Chest 2 View  11/17/2013   CLINICAL DATA:  Shortness of breath, productive cough  EXAM: CHEST  2 VIEW  COMPARISON:  CT chest dated 03/11/2013  FINDINGS: Lungs are clear.  No pleural effusion or pneumothorax.  The heart is normal in size.  Mild degenerative changes of the visualized thoracolumbar spine.  Cholecystectomy clips.  IMPRESSION: No evidence of acute cardiopulmonary disease.   Electronically Signed   By: Charline BillsSriyesh  Krishnan M.D.   On: 11/17/2013 18:19     EKG Interpretation  None      MDM   Final diagnoses:  Asthma exacerbation   49-year-old female presenting with exacerbation. She is ready to give it to nebulizer treatments and Solu-Medrol by EMS without significant improvement. Will obtain basic labs and chest x-ray.  Hour long continuous neb started.  Labs are reassuring. Chest x-ray is clear. Patient has finished hour long neb.  Breathing has slowed but still appears somewhat labored.  Pt ambulated to restroom, got short of breath, lightheaded, and began wheezing again.  Will admit for further management.  Discussed with Dr. Toniann Fail who will admit to telemetry.  Temp admission orders placed.  VS stable.  Garlon Hatchet, PA-C 11/17/13 [phone]

## 2013-11-17 NOTE — H&P (Signed)
[facility] History and Physical  Ralene MuskratLynette M Drone YNW:[id number] DOB: 09/08/1970 DOA: 11/17/2013  Referring physician: ER physician. PCP: No PCP Per Patient   Chief Complaint: Shortness of breath.  HPI: Wendy HawkingLynette M Wojciak is a 43 y.o. female with history of bronchial asthma and anemia started experiencing increasing shortness of breath with productive cough over the last 2 days. Patient had gone to the urgent care and was prescribed steroid inhalers and albuterol and was given one shot of steroid. Despite these patient's shortness of breath did not improve. Patient presented to the ER today and has been admitted for inpatient management of asthma exacerbation. Patient states that she has been having lot of productive cough ending up eventually throwing up. Denies any abdominal pain chest pain diarrhea. Chest x-ray does not show any infiltrates. On exam patient has mild wheezing. Patient is able to complete sentences without difficulty but appears fatigued.   Review of Systems: As presented in the history of presenting illness, rest negative.  Past Medical History  Diagnosis Date  . Asthma   . Seasonal allergies   . Arthritis    Past Surgical History  Procedure Laterality Date  . Cholecystectomy    . Cesarean section    . Tubal ligation     Social History:  reports that she quit smoking about 25 years ago. She has never used smokeless tobacco. She reports that she does not drink alcohol or use illicit drugs. Where does patient live home. Can patient participate in ADLs? Yes.  Allergies  Allergen Reactions  . Shrimp [Shellfish Allergy]     Wheezing     Family History:  Family History  Problem Relation Age of Onset  . Hypertension Mother   . Diabetes Mother   . Cancer Mother       Prior to Admission medications   Medication Sig Start Date End Date Taking? Authorizing Provider  albuterol (PROVENTIL HFA;VENTOLIN HFA) 108 (90 BASE) MCG/ACT inhaler Inhale 2-3 puffs into  the lungs every 6 (six) hours as needed for wheezing or shortness of breath.   Yes Historical Provider, MD  albuterol (PROVENTIL) (2.5 MG/3ML) 0.083% nebulizer solution Take 3 mLs (2.5 mg total) by nebulization every 6 (six) hours as needed for wheezing. For shortness of breath 03/23/13  Yes Richarda OverlieNayana Abrol, MD  Multiple Vitamin (MULTIVITAMIN WITH MINERALS) TABS tablet Take 1 tablet by mouth daily.   Yes Historical Provider, MD    Physical Exam: Filed Vitals:   11/17/13 1738 11/17/13 1816 11/17/13 1945 11/17/13 1948  BP: 130/78   141/89  Pulse: 76  82 78  Temp: 98.7 F (37.1 C)   98.3 F (36.8 C)  TempSrc: Oral   Oral  Resp: 16  16 22   Height: 5\' 2"  (1.575 m)     Weight: 79.379 kg (175 lb)     SpO2: 96% 100% 99% 99%     General:  Well-developed well-nourished.  Eyes: Anicteric no pallor.  ENT: No discharge from the ears eyes nose mouth.  Neck: No mass felt.  Cardiovascular: S1-S2 heard.  Respiratory: Mild expiratory wheeze heard bilaterally no crepitations.  Abdomen: Soft nontender bowel sounds present. No guarding rigidity.  Skin: Rash on the right ankle from recent infection. Rash also on the right second toe   Musculoskeletal: No edema.  Psychiatric: Appears normal.  Neurologic: Alert awake oriented to time place and person. Moves all extremities.  Labs on Admission:  Basic Metabolic Panel:  Recent Labs Lab 11/17/13 1800  NA 137  K 3.7  CL 102  CO2 22  GLUCOSE 109*  BUN 13  CREATININE 0.63  CALCIUM 9.0   Liver Function Tests: No results found for this basename: AST, ALT, ALKPHOS, BILITOT, PROT, ALBUMIN,  in the last 168 hours No results found for this basename: LIPASE, AMYLASE,  in the last 168 hours No results found for this basename: AMMONIA,  in the last 168 hours CBC:  Recent Labs Lab 11/17/13 1800  WBC 6.2  NEUTROABS 3.5  HGB 10.7*  HCT 33.3*  MCV 86.3  PLT 199   Cardiac Enzymes: No results found for this basename: CKTOTAL, CKMB,  CKMBINDEX, TROPONINI,  in the last 168 hours  BNP (last 3 results) No results found for this basename: PROBNP,  in the last 8760 hours CBG: No results found for this basename: GLUCAP,  in the last 168 hours  Radiological Exams on Admission: Dg Chest 2 View  11/17/2013   CLINICAL DATA:  Shortness of breath, productive cough  EXAM: CHEST  2 VIEW  COMPARISON:  CT chest dated 03/11/2013  FINDINGS: Lungs are clear.  No pleural effusion or pneumothorax.  The heart is normal in size.  Mild degenerative changes of the visualized thoracolumbar spine.  Cholecystectomy clips.  IMPRESSION: No evidence of acute cardiopulmonary disease.   Electronically Signed   By: Charline BillsSriyesh  Krishnan M.D.   On: 11/17/2013 18:19    Assessment/Plan Principal Problem:   Asthma exacerbation Active Problems:   Anemia   1. Acute asthma exacerbation - patient has been placed on IV steroids Pulmicort nebulizer and Zithromax. Check influenza PCR. 2. Anemia, chronic probably from menorrhagia - check anemia panel.    Code Status: Full code.  Family Communication: None.  Disposition Plan: Admit to inpatient.    Eduard ClosArshad N [place]il[place] Mcphee [facility] Pager [phone].  If 7PM-7AM, please contact night-coverage [email or web address] Password [email or web address] 11/17/2013, 8:22 PM

## 2013-11-17 NOTE — ED Notes (Signed)
Patient transported to X-ray 

## 2013-11-18 LAB — BASIC METABOLIC PANEL
BUN: 11 mg/dL (ref 6–23)
CHLORIDE: 105 meq/L (ref 96–112)
CO2: 22 mEq/L (ref 19–32)
Calcium: 9 mg/dL (ref 8.4–10.5)
Creatinine, Ser: 0.53 mg/dL (ref 0.50–1.10)
GFR calc Af Amer: >90 mL/min (ref >90)
GFR calc non Af Amer: >90 mL/min (ref >90)
Glucose, Bld: 154 mg/dL — ABNORMAL HIGH (ref 70–99)
Potassium: 4.1 mEq/L (ref 3.7–5.3)
SODIUM: 139 meq/L (ref 137–147)

## 2013-11-18 LAB — CBC
HEMATOCRIT: 33.4 % — AB (ref 36.0–46.0)
Hemoglobin: 10.7 g/dL — ABNORMAL LOW (ref 12.0–15.0)
MCH: 27.6 pg (ref 26.0–34.0)
MCHC: 32 g/dL (ref 30.0–36.0)
MCV: 86.1 fL (ref 78.0–100.0)
Platelets: 214 10*3/uL (ref 150–400)
RBC: 3.88 MIL/uL (ref 3.87–5.11)
RDW: 14.9 % (ref 11.5–15.5)
WBC: 9.3 10*3/uL (ref 4.0–10.5)

## 2013-11-18 LAB — INFLUENZA PANEL BY PCR (TYPE A & B)
H1N1 flu by pcr: NOT DETECTED
INFLAPCR: NEGATIVE
INFLBPCR: NEGATIVE

## 2013-11-18 LAB — IRON AND TIBC
Iron: 21 ug/dL — ABNORMAL LOW (ref 42–135)
Saturation Ratios: 6 % — ABNORMAL LOW (ref 20–55)
TIBC: 373 ug/dL (ref 250–470)
UIBC: 352 ug/dL (ref 125–400)

## 2013-11-18 LAB — VITAMIN B12: VITAMIN B 12: 335 pg/mL (ref 211–911)

## 2013-11-18 LAB — FERRITIN: FERRITIN: 7 ng/mL — AB (ref 10–291)

## 2013-11-18 LAB — FOLATE: Folate: >20 ng/mL

## 2013-11-18 MED ORDER — PREDNISONE 50 MG PO TABS
50.0000 mg | ORAL_TABLET | Freq: Every day | ORAL | Status: DC
  Administered 2013-11-18 – 2013-11-21 (×4): 50 mg via ORAL
  Filled 2013-11-18 (×5): qty 1

## 2013-11-18 MED ORDER — FERROUS SULFATE 325 (65 FE) MG PO TABS
325.0000 mg | ORAL_TABLET | Freq: Three times a day (TID) | ORAL | Status: DC
  Administered 2013-11-18 – 2013-11-21 (×8): 325 mg via ORAL
  Filled 2013-11-18 (×11): qty 1

## 2013-11-18 MED ORDER — DOCUSATE SODIUM 100 MG PO CAPS
100.0000 mg | ORAL_CAPSULE | Freq: Every day | ORAL | Status: DC | PRN
  Administered 2013-11-18: 100 mg via ORAL
  Filled 2013-11-18: qty 1

## 2013-11-18 MED ORDER — GUAIFENESIN 100 MG/5ML PO SOLN
5.0000 mL | ORAL | Status: DC | PRN
  Administered 2013-11-18 – 2013-11-19 (×3): 100 mg via ORAL
  Filled 2013-11-18 (×3): qty 10

## 2013-11-18 MED ORDER — AZITHROMYCIN 500 MG PO TABS
500.0000 mg | ORAL_TABLET | Freq: Every day | ORAL | Status: DC
  Administered 2013-11-18 – 2013-11-21 (×4): 500 mg via ORAL
  Filled 2013-11-18 (×4): qty 1

## 2013-11-18 NOTE — Progress Notes (Signed)
PROGRESS NOTE  Wendy MuskratLynette M James ZOX:[id number] DOB: 11/10/1970 DOA: 11/17/2013 PCP: No PCP Per Patient  HPI: Wendy James is a 43 y.o. female with history of bronchial asthma and anemia started experiencing increasing shortness of breath with productive cough over the last 2 days.   Assessment/Plan:  Acute asthma exacerbation - patient has been placed on IV steroids Pulmicort nebulizer and Zithromax. - transition to orals this morning - wheezing has resolved  - flu negative.   Anemia, chronic probably from menorrhagia  - iron deficient, will start supplements - advised to follow up with Gyn as an outpatient.    Diet: regular Fluids: none DVT Prophylaxis: Lovenox  Code Status: Full Family Communication: d/w patient  Disposition Plan: inpatient  Consultants:  none  Procedures:  none   Antibiotics Azithromycin 4/21 >>  HPI/Subjective: No complaints this morning.   Objective: Filed Vitals:   11/17/13 2047 11/17/13 2119 11/18/13 0527 11/18/13 0814  BP: 148/84  147/94   Pulse: 84  78   Temp: 98.3 F (36.8 C)  98.4 F (36.9 C)   TempSrc: Oral  Oral   Resp: 24  24   Height: 5\' 2"  (1.575 m)     Weight: 94.076 kg (207 lb 6.4 oz)     SpO2: 100% 100% 98% 98%    Intake/Output Summary (Last 24 hours) at 11/18/13 0831 Last data filed at 11/18/13 0528  Gross per 24 hour  Intake    610 ml  Output   2300 ml  Net  -1690 ml   Filed Weights   11/17/13 1738 11/17/13 2047  Weight: 79.379 kg (175 lb) 94.076 kg (207 lb 6.4 oz)    Exam:   General:  NAD  Cardiovascular: regular rate and rhythm, without MRG  Respiratory: good air movement, clear to auscultation throughout, no wheezing, ronchi or rales  Abdomen: soft, not tender to palpation, positive bowel sounds  MSK: no peripheral edema  Neuro: CN 2-12 grossly intact, MS 5/5 in all 4  Data Reviewed: Basic Metabolic Panel:  Recent Labs Lab 11/17/13 1800 11/18/13 0456  NA 137 139  K 3.7 4.1    CL 102 105  CO2 22 22  GLUCOSE 109* 154*  BUN 13 11  CREATININE 0.63 0.53  CALCIUM 9.0 9.0   Liver Function Tests: No results found for this basename: AST, ALT, ALKPHOS, BILITOT, PROT, ALBUMIN,  in the last 168 hours No results found for this basename: LIPASE, AMYLASE,  in the last 168 hours No results found for this basename: AMMONIA,  in the last 168 hours CBC:  Recent Labs Lab 11/17/13 1800 11/18/13 0456  WBC 6.2 9.3  NEUTROABS 3.5  --   HGB 10.7* 10.7*  HCT 33.3* 33.4*  MCV 86.3 86.1  PLT 199 214   Cardiac Enzymes: No results found for this basename: CKTOTAL, CKMB, CKMBINDEX, TROPONINI,  in the last 168 hours BNP (last 3 results) No results found for this basename: PROBNP,  in the last 8760 hours CBG: No results found for this basename: GLUCAP,  in the last 168 hours  No results found for this or any previous visit (from the past 240 hour(s)).   Studies: Dg Chest 2 View  11/17/2013   CLINICAL DATA:  Shortness of breath, productive cough  EXAM: CHEST  2 VIEW  COMPARISON:  CT chest dated 03/11/2013  FINDINGS: Lungs are clear.  No pleural effusion or pneumothorax.  The heart is normal in size.  Mild degenerative changes of the visualized thoracolumbar  spine.  Cholecystectomy clips.  IMPRESSION: No evidence of acute cardiopulmonary disease.   Electronically Signed   By: Charline BillsSriyesh  Krishnan M.D.   On: 11/17/2013 18:19    Scheduled Meds: . albuterol  2.5 mg Nebulization QID  . azithromycin  500 mg Intravenous Q24H  . budesonide (PULMICORT) nebulizer solution  0.25 mg Nebulization BID  . enoxaparin (LOVENOX) injection  40 mg Subcutaneous QHS  . methylPREDNISolone (SOLU-MEDROL) injection  40 mg Intravenous Q12H  . sodium chloride  3 mL Intravenous Q12H  . sodium chloride  3 mL Intravenous Q12H   Continuous Infusions:   Principal Problem:   Asthma exacerbation Active Problems:   Anemia   Time spent: 35  This note has been created with Furniture conservator/restorerDragon speech recognition  software and smart phrase technology. Any transcriptional errors are unintentional.   Pamella Pertostin Latara Micheli, MD [facility] Pager [phone]. If 7 PM - 7 AM, please contact night-coverage at [email or web address], password [email or web address] 11/18/2013, 8:31 AM  LOS: 1 day

## 2013-11-18 NOTE — Care Management Note (Addendum)
    Page 1 of 1   11/19/2013     4:48:26 PM CARE MANAGEMENT NOTE 11/19/2013  Patient:  Wendy James,Wendy James   Account Number:  [id number]  Date Initiated:  11/18/2013  Documentation initiated by:  Lanier ClamMAHABIR,Nile Prisk  Subjective/Objective Assessment:   43 Y/O F ADMITTED W/SOB.FA:OZHYQMHX:ASTHMA     Action/Plan:   FROM HOME. PCP-URGENT CARE.NO INSURANCE.[facility]Y-[facility].   Anticipated DC Date:  11/20/2013   Anticipated DC Plan:  HOME/SELF CARE  In-house referral  Financial Counselor      DC Planning Services  CM consult      Choice offered to / List presented to:             Status of service:  In process, will continue to follow Medicare Important Message given?   (If response is "NO", the following Medicare IM given date fields will be blank) Date Medicare IM given:   Date Additional Medicare IM given:    Discharge Disposition:    Per UR Regulation:  Reviewed for med. necessity/level of care/duration of stay  If discussed at Long Length of Stay Meetings, dates discussed:    Comments:  11/18/13 Wendy Coderre RN,BSN NCM [phone] COMMUNITY RESOURCES PROVIDED,$4 [facility] MED LIST.

## 2013-11-19 MED ORDER — ALBUTEROL SULFATE (2.5 MG/3ML) 0.083% IN NEBU: 2.5000 mg | INHALATION_SOLUTION | RESPIRATORY_TRACT | Status: DC | PRN

## 2013-11-19 MED ORDER — ALBUTEROL SULFATE (2.5 MG/3ML) 0.083% IN NEBU
2.5000 mg | INHALATION_SOLUTION | Freq: Once | RESPIRATORY_TRACT | Status: DC
Start: 2013-11-19 — End: 2013-11-19

## 2013-11-19 NOTE — Progress Notes (Signed)
PROGRESS NOTE  Wendy James ZOX:[id number] DOB: 02/27/1971 DOA: 11/17/2013 PCP: No PCP Per Patient  HPI:  Wendy James is a 43 y.o. female with history of bronchial asthma and anemia started experiencing increasing shortness of breath with productive cough over the last 2 days.   Assessment/Plan:  Acute asthma exacerbation - patient has been placed on IV steroids Pulmicort nebulizer and Zithromax. - transition to orals 4/23 - wheezing has resolved  - flu negative.  - clinically improving, walking more today  Anemia, chronic probably from menorrhagia  - iron deficient, will start supplements - advised to follow up with Gyn as an outpatient.    Diet: regular Fluids: none DVT Prophylaxis: Lovenox  Code Status: Full Family Communication: d/w patient  Disposition Plan: inpatient  Consultants:  none  Procedures:  none   Antibiotics Azithromycin 4/21 >>  HPI/Subjective: No complaints this morning.   Objective: Filed Vitals:   11/18/13 1941 11/18/13 2045 11/19/13 0547 11/19/13 0752  BP:  134/80 124/79   Pulse:  81 58   Temp:  98.8 F (37.1 C) 98.4 F (36.9 C)   TempSrc:  Oral Oral   Resp:  20 20   Height:      Weight:      SpO2: 100% 100% 100% 100%    Intake/Output Summary (Last 24 hours) at 11/19/13 1417 Last data filed at 11/19/13 1100  Gross per 24 hour  Intake   1360 ml  Output      0 ml  Net   1360 ml   Filed Weights   11/17/13 1738 11/17/13 2047  Weight: 79.379 kg (175 lb) 94.076 kg (207 lb 6.4 oz)   Exam:  General:  NAD  Cardiovascular: regular rate and rhythm, without MRG  Respiratory: good air movement, clear to auscultation throughout, no wheezing, ronchi or rales  Abdomen: soft, not tender to palpation, positive bowel sounds  MSK: no peripheral edema  Neuro: CN 2-12 grossly intact, MS 5/5 in all 4  Data Reviewed: Basic Metabolic Panel:  Recent Labs Lab 11/17/13 1800 11/18/13 0456  NA 137 139  K 3.7 4.1  CL  102 105  CO2 22 22  GLUCOSE 109* 154*  BUN 13 11  CREATININE 0.63 0.53  CALCIUM 9.0 9.0   CBC:  Recent Labs Lab 11/17/13 1800 11/18/13 0456  WBC 6.2 9.3  NEUTROABS 3.5  --   HGB 10.7* 10.7*  HCT 33.3* 33.4*  MCV 86.3 86.1  PLT 199 214   Studies: Dg Chest 2 View  11/17/2013   CLINICAL DATA:  Shortness of breath, productive cough  EXAM: CHEST  2 VIEW  COMPARISON:  CT chest dated 03/11/2013  FINDINGS: Lungs are clear.  No pleural effusion or pneumothorax.  The heart is normal in size.  Mild degenerative changes of the visualized thoracolumbar spine.  Cholecystectomy clips.  IMPRESSION: No evidence of acute cardiopulmonary disease.   Electronically Signed   By: Charline BillsSriyesh  Krishnan M.D.   On: 11/17/2013 18:19    Scheduled Meds: . albuterol  2.5 mg Nebulization QID  . azithromycin  500 mg Oral Daily  . budesonide (PULMICORT) nebulizer solution  0.25 mg Nebulization BID  . enoxaparin (LOVENOX) injection  40 mg Subcutaneous QHS  . ferrous sulfate  325 mg Oral TID WC  . predniSONE  50 mg Oral Q breakfast  . sodium chloride  3 mL Intravenous Q12H  . sodium chloride  3 mL Intravenous Q12H   Continuous Infusions:   Principal Problem:  Asthma exacerbation Active Problems:   Anemia  Time spent: 25  This note has been created with Education officer, environmentalDragon speech recognition software and smart phrase technology. Any transcriptional errors are unintentional.   Pamella Pertostin Gherghe, MD [facility] Pager [phone]. If 7 PM - 7 AM, please contact night-coverage at [email or web address], password [email or web address] 11/19/2013, 2:17 PM  LOS: 2 days

## 2013-11-20 MED ORDER — IPRATROPIUM-ALBUTEROL 0.5-2.5 (3) MG/3ML IN SOLN
3.0000 mL | Freq: Four times a day (QID) | RESPIRATORY_TRACT | Status: DC
  Administered 2013-11-20 – 2013-11-21 (×4): 3 mL via RESPIRATORY_TRACT
  Filled 2013-11-20 (×4): qty 3

## 2013-11-20 MED ORDER — IPRATROPIUM BROMIDE 0.02 % IN SOLN: 0.5000 mg | Freq: Four times a day (QID) | RESPIRATORY_TRACT | Status: DC

## 2013-11-20 MED ORDER — ZOLPIDEM TARTRATE 5 MG PO TABS
5.0000 mg | ORAL_TABLET | Freq: Every evening | ORAL | Status: DC | PRN
  Administered 2013-11-20: 5 mg via ORAL
  Filled 2013-11-20: qty 1

## 2013-11-20 NOTE — Progress Notes (Signed)
    PROGRESS NOTE  Wendy MuskratLynette M Dinges [id number] DOB: 11/18/1970 DOA: 11/17/2013 PCP: No PCP Per Patient  HPI:  Wendy James is a 43 y.o. female with history of bronchial asthma and anemia started experiencing increasing shortness of breath with productive cough over the last 2 days.   Assessment/Plan:  Acute asthma exacerbation - patient has been placed on IV steroids Pulmicort nebulizer and Zithromax. - transition to orals 4/23 - wheezing has resolved, but still has "tightness". Adding atrovent today. - flu negative.  - clinically improving, walking more today  Anemia, chronic probably from menorrhagia  - iron deficient, will start supplements - advised to follow up with Gyn as an outpatient.    Diet: regular Fluids: none DVT Prophylaxis: Lovenox  Code Status: Full Family Communication: d/w patient  Disposition Plan: inpatient  Consultants:  none  Procedures:  none   Antibiotics Azithromycin 4/21 >>  HPI/Subjective: No complaints this morning.   Objective: Filed Vitals:   11/19/13 2108 11/20/13 0456 11/20/13 0739 11/20/13 1127  BP: 143/84 139/93    Pulse: 68 71    Temp: 98.3 F (36.8 C) 98.7 F (37.1 C)    TempSrc: Oral Oral    Resp: 18 18    Height:      Weight:      SpO2: 100% 100% 100% 100%   No intake or output data in the 24 hours ending 11/20/13 1238 Filed Weights   11/17/13 1738 11/17/13 2047  Weight: 79.379 kg (175 lb) 94.076 kg (207 lb 6.4 oz)   Exam:  General:  NAD  Cardiovascular: regular rate and rhythm, without MRG  Respiratory: good air movement, clear to auscultation throughout, no wheezing, ronchi or rales  Abdomen: soft, not tender to palpation, positive bowel sounds  MSK: no peripheral edema  Neuro: CN 2-12 grossly intact, MS 5/5 in all 4  Data Reviewed: Basic Metabolic Panel:  Recent Labs Lab 11/17/13 1800 11/18/13 0456  NA 137 139  K 3.7 4.1  CL 102 105  CO2 22 22  GLUCOSE 109* 154*  BUN 13 11    CREATININE 0.63 0.53  CALCIUM 9.0 9.0   CBC:  Recent Labs Lab 11/17/13 1800 11/18/13 0456  WBC 6.2 9.3  NEUTROABS 3.5  --   HGB 10.7* 10.7*  HCT 33.3* 33.4*  MCV 86.3 86.1  PLT 199 214   Studies: No results found.  Scheduled Meds: . azithromycin  500 mg Oral Daily  . budesonide (PULMICORT) nebulizer solution  0.25 mg Nebulization BID  . enoxaparin (LOVENOX) injection  40 mg Subcutaneous QHS  . ferrous sulfate  325 mg Oral TID WC  . ipratropium-albuterol  3 mL Nebulization QID  . predniSONE  50 mg Oral Q breakfast  . sodium chloride  3 mL Intravenous Q12H  . sodium chloride  3 mL Intravenous Q12H   Continuous Infusions:   Principal Problem:   Asthma exacerbation Active Problems:   Anemia  Time spent: 25  This note has been created with Education officer, environmentalDragon speech recognition software and smart phrase technology. Any transcriptional errors are unintentional.   Pamella Pertostin Gherghe, MD [facility] Pager [phone]. If 7 PM - 7 AM, please contact night-coverage at [email or web address], password [email or web address] 11/20/2013, 12:38 PM  LOS: 3 days

## 2013-11-21 MED ORDER — FERROUS SULFATE 325 (65 FE) MG PO TABS: 325.0000 mg | ORAL_TABLET | Freq: Three times a day (TID) | ORAL | Status: DC

## 2013-11-21 MED ORDER — PREDNISONE 20 MG PO TABS: 40.0000 mg | ORAL_TABLET | Freq: Every day | ORAL | Status: DC

## 2013-11-21 MED ORDER — AZITHROMYCIN 500 MG PO TABS: 500.0000 mg | ORAL_TABLET | Freq: Every day | ORAL | Status: DC

## 2013-11-21 MED ORDER — GUAIFENESIN 100 MG/5ML PO SOLN: 5.0000 mL | ORAL | Status: DC | PRN

## 2013-11-21 MED ORDER — BUDESONIDE 0.25 MG/2ML IN SUSP: 0.2500 mg | Freq: Two times a day (BID) | RESPIRATORY_TRACT | Status: DC

## 2013-11-21 MED ORDER — ALBUTEROL SULFATE (2.5 MG/3ML) 0.083% IN NEBU: 2.5000 mg | INHALATION_SOLUTION | Freq: Four times a day (QID) | RESPIRATORY_TRACT | Status: DC | PRN

## 2013-11-21 MED ORDER — ALBUTEROL SULFATE HFA 108 (90 BASE) MCG/ACT IN AERS: 2.0000 | INHALATION_SPRAY | Freq: Four times a day (QID) | RESPIRATORY_TRACT | Status: DC | PRN

## 2013-11-21 NOTE — Progress Notes (Signed)
Patient discharge to home, husband at bedside.No complaints of any pain or discomfort, no SOB noted. D/c instructions and follow up appointments done  Patient verbalized understanding.and was given to the patient.

## 2013-11-21 NOTE — Discharge Summary (Signed)
Physician Discharge Summary  Wendy James ZOX:[id number] DOB: 12/27/1970 DOA: 11/17/2013  PCP: No PCP Per Patient  Admit date: 11/17/2013 Discharge date: 11/21/2013  Time spent: 35 minutes  Recommendations for Outpatient Follow-up:  1. Follow up with PCP in 1-2 weeks 2. Follow up with Pulmonary as an outpatient.    Discharge Diagnoses:  Principal Problem:   Asthma exacerbation Active Problems:   Anemia  Discharge Condition: stable  Diet recommendation: regular  Filed Weights   11/17/13 1738 11/17/13 2047  Weight: 79.379 kg (175 lb) 94.076 kg (207 lb 6.4 oz)   History of present illness:  Wendy James is a 43 y.o. female with history of bronchial asthma and anemia started experiencing increasing shortness of breath with productive cough over the last 2 days. Patient had gone to the urgent care and was prescribed steroid inhalers and albuterol and was given one shot of steroid. Despite these patient's shortness of breath did not improve. Patient presented to the ER today and has been admitted for inpatient management of asthma exacerbation. Patient states that she has been having lot of productive cough ending up eventually throwing up. Denies any abdominal pain chest pain diarrhea. Chest x-ray does not show any infiltrates. On exam patient has mild wheezing. Patient is able to complete sentences without difficulty but appears fatigued.   Hospital Course:  Acute asthma exacerbation - patient has been placed on IV steroids, nebulizers and antibiotics on admission. She was rather slow to improve as seen before during her hospitalization in 2014. She responded well to treatment, was stable on room air, walking without any chest pain or dyspnea and was discharged home in stable condition. Scheduled Pulmicort was added to her home regimen along with Albuterol as needed.  Anemia, chronic probably from menorrhagia - iron deficient, will start supplements, advised to follow up with  Gyn as an outpatient.   Procedures:  none   Consultations:  none  Discharge Exam: Filed Vitals:   11/20/13 2016 11/20/13 2201 11/21/13 0520 11/21/13 0726  BP:  133/80 131/87   Pulse:  64 63   Temp:  98.3 F (36.8 C) 98.7 F (37.1 C)   TempSrc:  Oral Oral   Resp:  20 18   Height:      Weight:      SpO2: 97% 100% 98% 97%    General: NAD Cardiovascular: RRR Respiratory: CTA biL  Discharge Instructions     Medication List         albuterol 108 (90 BASE) MCG/ACT inhaler  Commonly known as:  PROVENTIL HFA;VENTOLIN HFA  Inhale 2-3 puffs into the lungs every 6 (six) hours as needed for wheezing or shortness of breath.     albuterol (2.5 MG/3ML) 0.083% nebulizer solution  Commonly known as:  PROVENTIL  Take 3 mLs (2.5 mg total) by nebulization every 6 (six) hours as needed for wheezing. For shortness of breath     azithromycin 500 MG tablet  Commonly known as:  ZITHROMAX  Take 1 tablet (500 mg total) by mouth daily.     budesonide 0.25 MG/2ML nebulizer solution  Commonly known as:  PULMICORT  Take 2 mLs (0.25 mg total) by nebulization 2 (two) times daily.     ferrous sulfate 325 (65 FE) MG tablet  Take 1 tablet (325 mg total) by mouth 3 (three) times daily with meals.     guaiFENesin 100 MG/5ML Soln  Commonly known as:  ROBITUSSIN  Take 5 mLs (100 mg total) by mouth every  4 (four) hours as needed for cough or to loosen phlegm.     multivitamin with minerals Tabs tablet  Take 1 tablet by mouth daily.     predniSONE 20 MG tablet  Commonly known as:  DELTASONE  Take 2 tablets (40 mg total) by mouth daily with breakfast. 2 tabs daily for 3 days then 1 tab daily for 3 days then 1/2 tab daily for 4 days           Follow-up Information   Follow up with [facility]. Schedule an appointment as soon as possible for a visit in 2 weeks.   Specialty:  Pulmonology   Contact information:   [address]  [place] [id number] [phone]      The  results of significant diagnostics from this hospitalization (including imaging, microbiology, ancillary and laboratory) are listed below for reference.    Significant Diagnostic Studies: Dg Chest 2 View  11/17/2013   CLINICAL DATA:  Shortness of breath, productive cough  EXAM: CHEST  2 VIEW  COMPARISON:  CT chest dated 03/11/2013  FINDINGS: Lungs are clear.  No pleural effusion or pneumothorax.  The heart is normal in size.  Mild degenerative changes of the visualized thoracolumbar spine.  Cholecystectomy clips.  IMPRESSION: No evidence of acute cardiopulmonary disease.   Electronically Signed   By: Charline BillsSriyesh  Krishnan M.D.   On: 11/17/2013 18:19   Labs: Basic Metabolic Panel:  Recent Labs Lab 11/17/13 1800 11/18/13 0456  NA 137 139  K 3.7 4.1  CL 102 105  CO2 22 22  GLUCOSE 109* 154*  BUN 13 11  CREATININE 0.63 0.53  CALCIUM 9.0 9.0   CBC:  Recent Labs Lab 11/17/13 1800 11/18/13 0456  WBC 6.2 9.3  NEUTROABS 3.5  --   HGB 10.7* 10.7*  HCT 33.3* 33.4*  MCV 86.3 86.1  PLT 199 214    Signed:  Costin M Gherghe  [facility] 11/21/2013, 4:07 PM

## 2013-11-21 NOTE — Discharge Instructions (Signed)
You were cared for by a hospitalist during your hospital stay. If you have any questions about your discharge medications or the care you received while you were in the hospital after you are discharged, you can call the unit and asked to speak with the hospitalist on call if the hospitalist that took care of you is not available. Once you are discharged, your primary care physician will handle any further medical issues. Please note that NO REFILLS for any discharge medications will be authorized once you are discharged, as it is imperative that you return to your primary care physician (or establish a relationship with a primary care physician if you do not have one) for your aftercare needs so that they can reassess your need for medications and monitor your lab values. °  °  °If you do not have a primary care physician, you can call [phone] for a physician referral. ° °Follow with Primary MD in 5-7 days  ° °Get CBC, CMP checked by your doctor and again as further instructed.  °Get a 2 view Chest X ray done next visit if you had Pneumonia of Lung problems at the Hospital. ° °Get Medicines reviewed and adjusted. ° °Please request your Prim.MD to go over all Hospital Tests and Procedure/Radiological results at the follow up, please get all Hospital records sent to your Prim MD by signing hospital release before you go home. ° °Activity: As tolerated with Full fall precautions use walker/cane & assistance as needed ° °Diet: regular ° °For Heart failure patients - Check your Weight same time everyday, if you gain over 2 pounds, or you develop in leg swelling, experience more shortness of breath or chest pain, call your Primary MD immediately. Follow Cardiac Low Salt Diet and 1.8 lit/day fluid restriction. ° °Disposition Home ° °If you experience worsening of your admission symptoms, develop shortness of breath, life threatening emergency, suicidal or homicidal thoughts you must seek medical attention immediately by  calling 911 or calling your MD immediately  if symptoms less severe. ° °You Must read complete instructions/literature along with all the possible adverse reactions/side effects for all the Medicines you take and that have been prescribed to you. Take any new Medicines after you have completely understood and accpet all the possible adverse reactions/side effects.  ° °Do not drive and provide baby sitting services if your were admitted for syncope or siezures until you have seen by Primary MD or a Neurologist and advised to do so again. ° °Do not drive when taking Pain medications.  ° °Do not take more than prescribed Pain, Sleep and Anxiety Medications ° °Special Instructions: If you have smoked or chewed Tobacco  in the last 2 yrs please stop smoking, stop any regular Alcohol  and or any Recreational drug use. ° °Wear Seat belts while driving. ° °

## 2014-05-19 ENCOUNTER — Encounter (HOSPITAL_COMMUNITY): Payer: Self-pay | Admitting: Emergency Medicine

## 2014-05-19 ENCOUNTER — Emergency Department (HOSPITAL_COMMUNITY): Payer: Self-pay

## 2014-05-19 ENCOUNTER — Emergency Department (HOSPITAL_COMMUNITY)
Admission: EM | Admit: 2014-05-19 | Discharge: 2014-05-19 | Disposition: A | Discharge status: Home / Self Care | Payer: Self-pay | Attending: Emergency Medicine | Admitting: Emergency Medicine

## 2014-05-19 DIAGNOSIS — A599 Trichomoniasis, unspecified: Secondary | ICD-10-CM

## 2014-05-19 DIAGNOSIS — B373 Candidiasis of vulva and vagina: Secondary | ICD-10-CM | POA: Insufficient documentation

## 2014-05-19 DIAGNOSIS — J45909 Unspecified asthma, uncomplicated: Secondary | ICD-10-CM | POA: Insufficient documentation

## 2014-05-19 DIAGNOSIS — R519 Headache, unspecified: Secondary | ICD-10-CM

## 2014-05-19 DIAGNOSIS — R258 Other abnormal involuntary movements: Secondary | ICD-10-CM

## 2014-05-19 DIAGNOSIS — Z8739 Personal history of other diseases of the musculoskeletal system and connective tissue: Secondary | ICD-10-CM | POA: Insufficient documentation

## 2014-05-19 DIAGNOSIS — Z87891 Personal history of nicotine dependence: Secondary | ICD-10-CM | POA: Insufficient documentation

## 2014-05-19 DIAGNOSIS — R51 Headache: Secondary | ICD-10-CM | POA: Insufficient documentation

## 2014-05-19 DIAGNOSIS — G255 Other chorea: Secondary | ICD-10-CM | POA: Insufficient documentation

## 2014-05-19 DIAGNOSIS — Z79899 Other long term (current) drug therapy: Secondary | ICD-10-CM | POA: Insufficient documentation

## 2014-05-19 LAB — URINE MICROSCOPIC-ADD ON

## 2014-05-19 LAB — CBC WITH DIFFERENTIAL/PLATELET
BASOS PCT: 0 % (ref 0–1)
Basophils Absolute: 0 10*3/uL (ref 0.0–0.1)
EOS PCT: 4 % (ref 0–5)
Eosinophils Absolute: 0.2 10*3/uL (ref 0.0–0.7)
HEMATOCRIT: 35.3 % — AB (ref 36.0–46.0)
Hemoglobin: 11.6 g/dL — ABNORMAL LOW (ref 12.0–15.0)
LYMPHS ABS: 2 10*3/uL (ref 0.7–4.0)
Lymphocytes Relative: 44 % (ref 12–46)
MCH: 28.1 pg (ref 26.0–34.0)
MCHC: 32.9 g/dL (ref 30.0–36.0)
MCV: 85.5 fL (ref 78.0–100.0)
MONO ABS: 0.4 10*3/uL (ref 0.1–1.0)
MONOS PCT: 9 % (ref 3–12)
NEUTROS PCT: 43 % (ref 43–77)
Neutro Abs: 2 10*3/uL (ref 1.7–7.7)
Platelets: 208 10*3/uL (ref 150–400)
RBC: 4.13 MIL/uL (ref 3.87–5.11)
RDW: 15 % (ref 11.5–15.5)
WBC: 4.6 10*3/uL (ref 4.0–10.5)

## 2014-05-19 LAB — BASIC METABOLIC PANEL
Anion gap: 11 (ref 5–15)
BUN: 13 mg/dL (ref 6–23)
CO2: 22 mEq/L (ref 19–32)
Calcium: 8.2 mg/dL — ABNORMAL LOW (ref 8.4–10.5)
Chloride: 107 mEq/L (ref 96–112)
Creatinine, Ser: 0.69 mg/dL (ref 0.50–1.10)
GFR calc Af Amer: >90 mL/min (ref >90)
GFR calc non Af Amer: >90 mL/min (ref >90)
GLUCOSE: 105 mg/dL — AB (ref 70–99)
POTASSIUM: 3.9 meq/L (ref 3.7–5.3)
Sodium: 140 mEq/L (ref 137–147)

## 2014-05-19 LAB — URINALYSIS, ROUTINE W REFLEX MICROSCOPIC
Bilirubin Urine: NEGATIVE
Glucose, UA: NEGATIVE mg/dL
KETONES UR: NEGATIVE mg/dL
Nitrite: NEGATIVE
PH: 5.5 (ref 5.0–8.0)
Protein, ur: NEGATIVE mg/dL
Specific Gravity, Urine: 1.012 (ref 1.005–1.030)
Urobilinogen, UA: 0.2 mg/dL (ref 0.0–1.0)

## 2014-05-19 LAB — I-STAT CG4 LACTIC ACID, ED: Lactic Acid, Venous: 0.7 mmol/L (ref 0.5–2.2)

## 2014-05-19 LAB — RAPID URINE DRUG SCREEN, HOSP PERFORMED
Amphetamines: NOT DETECTED
BENZODIAZEPINES: NOT DETECTED
Barbiturates: NOT DETECTED
Cocaine: NOT DETECTED
OPIATES: NOT DETECTED
TETRAHYDROCANNABINOL: NOT DETECTED

## 2014-05-19 MED ORDER — HYDROCODONE-ACETAMINOPHEN 5-325 MG PO TABS: 1.0000 | ORAL_TABLET | ORAL | Status: DC | PRN

## 2014-05-19 MED ORDER — METRONIDAZOLE 0.75 % VA GEL: 1.0000 | Freq: Two times a day (BID) | VAGINAL | Status: DC

## 2014-05-19 MED ORDER — HYDROCODONE-ACETAMINOPHEN 5-325 MG PO TABS
1.0000 | ORAL_TABLET | Freq: Once | ORAL | Status: AC
  Administered 2014-05-19: 1 via ORAL
  Filled 2014-05-19: qty 1

## 2014-05-19 NOTE — ED Provider Notes (Addendum)
CSN: [id number]     Arrival date & time 05/19/14  0601 History   First MD Initiated Contact with Patient 05/19/14 [phone]     Chief Complaint  Patient presents with  . Headache     (Consider location/radiation/quality/duration/timing/severity/associated sxs/prior Treatment) Patient is a 43 y.o. female presenting with headaches. The history is provided by the patient. No language interpreter was used.  Headache Pain location:  Generalized Associated symptoms: no abdominal pain and no vomiting   Associated symptoms comment:  Headache that started last night associated with facial and neck spasms, continuous upper extremity movement and less intense lower extremity movements. She took a benadryl and a tramadol for the headache. She has been on these medications for several months. No recent illness or fever, no nausea or vomiting. No visual changes. No history of similar symptoms. Her symptoms of involuntary movements have improved throughout the night but she states she still "doesn't feel right".    Past Medical History  Diagnosis Date  . Asthma   . Seasonal allergies   . Arthritis    Past Surgical History  Procedure Laterality Date  . Cholecystectomy    . Cesarean section    . Tubal ligation     Family History  Problem Relation Age of Onset  . Hypertension Mother   . Diabetes Mother   . Cancer Mother    History  Substance Use Topics  . Smoking status: Former Smoker    Quit date: 11/17/1988  . Smokeless tobacco: Never Used  . Alcohol Use: No   OB History   Grav Para Term Preterm Abortions TAB SAB Ect Mult Living   5 4 4  0 1 0 1 0 0 3     Review of Systems  Constitutional: Negative.   Respiratory: Negative.  Negative for shortness of breath.   Cardiovascular: Negative.  Negative for chest pain.  Gastrointestinal: Negative.  Negative for vomiting and abdominal pain.  Musculoskeletal: Negative.   Skin: Negative.   Neurological: Positive for headaches.       See HPI.       Allergies  Shrimp  Home Medications   Prior to Admission medications   Medication Sig Start Date End Date Taking? Authorizing Provider  albuterol (PROVENTIL HFA;VENTOLIN HFA) 108 (90 BASE) MCG/ACT inhaler Inhale 2-3 puffs into the lungs every 6 (six) hours as needed for wheezing or shortness of breath. 11/21/13  Yes Costin Otelia Sergeant, MD  albuterol (PROVENTIL) (2.5 MG/3ML) 0.083% nebulizer solution Take 3 mLs (2.5 mg total) by nebulization every 6 (six) hours as needed for wheezing. For shortness of breath 11/21/13  Yes Costin Otelia Sergeant, MD  diphenhydrAMINE (BENADRYL) 25 mg capsule Take 25 mg by mouth once.   Yes Historical Provider, MD   BP 148/98  Pulse 75  Temp(Src) 99 F (37.2 C) (Oral)  Resp 18  SpO2 98%  LMP 05/19/2014 Physical Exam  Constitutional: She is oriented to person, place, and time. She appears well-developed and well-nourished.  HENT:  Head: Normocephalic.  Eyes: Pupils are equal, round, and reactive to light.  Neck: Normal range of motion. Neck supple.  Cardiovascular: Normal rate and regular rhythm.   Pulmonary/Chest: Effort normal and breath sounds normal.  Abdominal: Soft. Bowel sounds are normal. There is no tenderness. There is no rebound and no guarding.  Musculoskeletal: Normal range of motion.  Involuntary facial drawing and upper greater than lower extremity choreiform movements that stop when extremity is in use. No mental status changes. Speech clear and focused. CN's  3-12 grossly intact. No abnormal tone.   Neurological: She is alert and oriented to person, place, and time. She has normal strength and normal reflexes. No cranial nerve deficit or sensory deficit. She displays a negative Romberg sign. Coordination normal.  Skin: Skin is warm and dry. No rash noted.  Psychiatric: She has a normal mood and affect.    ED Course  Procedures (including critical care time) Labs Review Labs Reviewed  CBC WITH DIFFERENTIAL - Abnormal; Notable for the  following:    Hemoglobin 11.6 (*)    HCT 35.3 (*)    All other components within normal limits  BASIC METABOLIC PANEL - Abnormal; Notable for the following:    Glucose, Bld 105 (*)    Calcium 8.2 (*)    All other components within normal limits  URINALYSIS, ROUTINE W REFLEX MICROSCOPIC  URINE RAPID DRUG SCREEN (HOSP PERFORMED)  I-STAT CG4 LACTIC ACID, ED   Results for orders placed during the hospital encounter of 05/19/14  CBC WITH DIFFERENTIAL      Result Value Ref Range   WBC 4.6  4.0 - 10.5 K/uL   RBC 4.13  3.87 - 5.11 MIL/uL   Hemoglobin 11.6 (*) 12.0 - 15.0 g/dL   HCT 16.135.3 (*) 09.636.0 - 04.546.0 %   MCV 85.5  78.0 - 100.0 fL   MCH 28.1  26.0 - 34.0 pg   MCHC 32.9  30.0 - 36.0 g/dL   RDW 40.915.0  81.111.5 - 91.415.5 %   Platelets 208  150 - 400 K/uL   Neutrophils Relative % 43  43 - 77 %   Neutro Abs 2.0  1.7 - 7.7 K/uL   Lymphocytes Relative 44  12 - 46 %   Lymphs Abs 2.0  0.7 - 4.0 K/uL   Monocytes Relative 9  3 - 12 %   Monocytes Absolute 0.4  0.1 - 1.0 K/uL   Eosinophils Relative 4  0 - 5 %   Eosinophils Absolute 0.2  0.0 - 0.7 K/uL   Basophils Relative 0  0 - 1 %   Basophils Absolute 0.0  0.0 - 0.1 K/uL  BASIC METABOLIC PANEL      Result Value Ref Range   Sodium 140  137 - 147 mEq/L   Potassium 3.9  3.7 - 5.3 mEq/L   Chloride 107  96 - 112 mEq/L   CO2 22  19 - 32 mEq/L   Glucose, Bld 105 (*) 70 - 99 mg/dL   BUN 13  6 - 23 mg/dL   Creatinine, Ser 7.820.69  0.50 - 1.10 mg/dL   Calcium 8.2 (*) 8.4 - 10.5 mg/dL   GFR calc non Af Amer >90  >90 mL/min   GFR calc Af Amer >90  >90 mL/min   Anion gap 11  5 - 15  URINALYSIS, ROUTINE W REFLEX MICROSCOPIC      Result Value Ref Range   Color, Urine YELLOW  YELLOW   APPearance CLOUDY (*) CLEAR   Specific Gravity, Urine 1.012  1.005 - 1.030   pH 5.5  5.0 - 8.0   Glucose, UA NEGATIVE  NEGATIVE mg/dL   Hgb urine dipstick LARGE (*) NEGATIVE   Bilirubin Urine NEGATIVE  NEGATIVE   Ketones, ur NEGATIVE  NEGATIVE mg/dL   Protein, ur NEGATIVE   NEGATIVE mg/dL   Urobilinogen, UA 0.2  0.0 - 1.0 mg/dL   Nitrite NEGATIVE  NEGATIVE   Leukocytes, UA TRACE (*) NEGATIVE  URINE RAPID DRUG SCREEN (HOSP PERFORMED)  Result Value Ref Range   Opiates NONE DETECTED  NONE DETECTED   Cocaine NONE DETECTED  NONE DETECTED   Benzodiazepines NONE DETECTED  NONE DETECTED   Amphetamines NONE DETECTED  NONE DETECTED   Tetrahydrocannabinol NONE DETECTED  NONE DETECTED   Barbiturates NONE DETECTED  NONE DETECTED  URINE MICROSCOPIC-ADD ON      Result Value Ref Range   Squamous Epithelial / LPF MANY (*) RARE   WBC, UA 0-2  <3 WBC/hpf   RBC / HPF 21-50  <3 RBC/hpf   Bacteria, UA MANY (*) RARE   Urine-Other TRICHOMONAS PRESENT    I-STAT CG4 LACTIC ACID, ED      Result Value Ref Range   Lactic Acid, Venous 0.70  0.5 - 2.2 mmol/L     Imaging Review Ct Head Wo Contrast  05/19/2014   CLINICAL DATA:  Headaches and altered movement.  EXAM: CT HEAD WITHOUT CONTRAST  TECHNIQUE: Contiguous axial images were obtained from the base of the skull through the vertex without intravenous contrast.  COMPARISON:  07/26/2013  FINDINGS: Ventricles and sulci appear symmetrical. No mass effect or midline shift. No abnormal extra-axial fluid collections. Gray-white matter junctions are distinct. Basal cisterns are not effaced. No evidence of acute intracranial hemorrhage. No depressed skull fractures. Visualized paranasal sinuses and mastoid air cells are not opacified.  IMPRESSION: No acute intracranial abnormalities.   Electronically Signed   By: Burman NievesWilliam  Stevens M.D.   On: 05/19/2014 06:41     EKG Interpretation None      MDM   Final diagnoses:  None    1. Headache 2. Choreic movements 3. Trichomonas  Headache improving and involuntary movements decreasing over time. VSS, blood pressure significantly improved in ED without intervention.   Discussed patient's presentation and evaluation with Dr. Cyril Mourningamillo (neuro-hospitalist) who advises outpatient  evaluation and referral to neurology. Discussed with patient and family who are agreeable to discharge home.   Incidental trichomonas infection on UA treated with Metrogel. Urine culture pending.     Arnoldo HookerShari A Bronc Brosseau, PA-C 05/19/14 0849  Arnoldo HookerShari A Owain Eckerman, PA-C 06/10/14 0425  Olivia Mackielga M Otter, MD 06/13/14 [phone]

## 2014-05-19 NOTE — ED Notes (Signed)
Pt denies history of migraines. Pt currently denies nausea and vomiting.

## 2014-05-19 NOTE — ED Notes (Signed)
Patient transported to CT 

## 2014-05-19 NOTE — ED Notes (Signed)
Blue top and Pink top lab tubes sent to lab for holding.

## 2014-05-19 NOTE — ED Notes (Signed)
Pt demonstrates some difficulty speaking. When asked if she feels that her headache is affecting any other part of her body, pt reports that her speech is slowed and difficult to express words. Pts family at the bedside concur that this current presentation of pts speech is not her baseline.

## 2014-05-19 NOTE — ED Notes (Signed)
Patient stated during triage her period was last month. Patient ambulated to restroom with minimal assistance. Patient urine sample with pink tinge. Questioned patient when her last period was and she states it just started. MD notified.

## 2014-05-19 NOTE — ED Notes (Signed)
Patient returned from CT

## 2014-05-19 NOTE — ED Notes (Signed)
2 pairs of earrings handed to patient husband per patient request.

## 2014-05-19 NOTE — ED Provider Notes (Signed)
MSE was initiated and I personally evaluated the patient and placed orders (if any) at  6:39 AM on May 19, 2014.  The patient appears stable so that the remainder of the MSE may be completed by another provider.  Pt presents to the ER from home with complaint of headache and "not being right" since 10 pm last night.  Pt denies h/o headaches.  Pt reports she has been unable to control her body.  She had symptoms of facial grimacing, arm movements and headache prior to going to sleep last night, and then woke with them again this morning.  No fevers, chills, n/v/d.  No recent URI sxs.  No new medications.  No focal weakness, numbness, difficulties speaking or swallowing.  Pt has h/o seasonal allergies and asthma.  Pt noted to be hypertensive, no prior h/o same.  Neuro exam shows choreiform movements of upper extremities, some milder movements of lower extremities, facial grimacing.  Pt is able to follow commands, and movements cease with intentional movement.  Labs and head CT ordered.    Olivia Mackielga M Lijah Bourque, MD 05/19/14 [phone]

## 2014-05-19 NOTE — ED Notes (Signed)
Patient arrives with her husband. Patient repeatedly states "something ain't right", balling her hands up, and complaining that her head hurts. Patient denies medical hx other than asthma, patient denies taking any medications other than benadryl, states she only took 1 pill. Patient denies weakness. Dr Norlene Campbelltter at bedside.

## 2014-05-19 NOTE — Discharge Instructions (Signed)
Trichomoniasis Trichomoniasis is an infection caused by an organism called Trichomonas. The infection can affect both women and men. In women, the outer female genitalia and the vagina are affected. In men, the penis is mainly affected, but the prostate and other reproductive organs can also be involved. Trichomoniasis is a sexually transmitted infection (STI) and is most often passed to another person through sexual contact.  RISK FACTORS  Having unprotected sexual intercourse.  Having sexual intercourse with an infected partner. SIGNS AND SYMPTOMS  Symptoms of trichomoniasis in women include:  Abnormal gray-green frothy vaginal discharge.  Itching and irritation of the vagina.  Itching and irritation of the area outside the vagina. Symptoms of trichomoniasis in men include:   Penile discharge with or without pain.  Pain during urination. This results from inflammation of the urethra. DIAGNOSIS  Trichomoniasis may be found during a Pap test or physical exam. Your health care provider may use one of the following methods to help diagnose this infection:  Examining vaginal discharge under a microscope. For men, urethral discharge would be examined.  Testing the pH of the vagina with a test tape.  Using a vaginal swab test that checks for the Trichomonas organism. A test is available that provides results within a few minutes.  Doing a culture test for the organism. This is not usually needed. TREATMENT   You may be given medicine to fight the infection. Women should inform their health care provider if they could be or are pregnant. Some medicines used to treat the infection should not be taken during pregnancy.  Your health care provider may recommend over-the-counter medicines or creams to decrease itching or irritation.  Your sexual partner will need to be treated if infected. HOME CARE INSTRUCTIONS   Take medicines only as directed by your health care provider.  Take  over-the-counter medicine for itching or irritation as directed by your health care provider.  Do not have sexual intercourse while you have the infection.  Women should not douche or wear tampons while they have the infection.  Discuss your infection with your partner. Your partner may have gotten the infection from you, or you may have gotten it from your partner.  Have your sex partner get examined and treated if necessary.  Practice safe, informed, and protected sex.  See your health care provider for other STI testing. SEEK MEDICAL CARE IF:   You still have symptoms after you finish your medicine.  You develop abdominal pain.  You have pain when you urinate.  You have bleeding after sexual intercourse.  You develop a rash.  Your medicine makes you sick or makes you throw up (vomit). MAKE SURE YOU:  Understand these instructions.  Will watch your condition.  Will get help right away if you are not doing well or get worse. Document Released: 01/09/2001 Document Revised: 11/30/2013 Document Reviewed: 04/27/2013 [facility] Patient Information 2015 [employer], [place]. This information is not intended to replace advice given to you by your health care provider. Make sure you discuss any questions you have with your health care provider. General Headache Without Cause A headache is pain or discomfort felt around the head or neck area. The specific cause of a headache may not be found. There are many causes and types of headaches. A few common ones are:  Tension headaches.  Migraine headaches.  Cluster headaches.  Chronic daily headaches. HOME CARE INSTRUCTIONS   Keep all follow-up appointments with your caregiver or any specialist referral.  Only take over-the-counter or  prescription medicines for pain or discomfort as directed by your caregiver.  Lie down in a dark, quiet room when you have a headache.  Keep a headache journal to find out what may trigger your migraine  headaches. For example, write down:  What you eat and drink.  How much sleep you get.  Any change to your diet or medicines.  Try massage or other relaxation techniques.  Put ice packs or heat on the head and neck. Use these 3 to 4 times per day for 15 to 20 minutes each time, or as needed.  Limit stress.  Sit up straight, and do not tense your muscles.  Quit smoking if you smoke.  Limit alcohol use.  Decrease the amount of caffeine you drink, or stop drinking caffeine.  Eat and sleep on a regular schedule.  Get 7 to 9 hours of sleep, or as recommended by your caregiver.  Keep lights dim if bright lights bother you and make your headaches worse. SEEK MEDICAL CARE IF:   You have problems with the medicines you were prescribed.  Your medicines are not working.  You have a change from the usual headache.  You have nausea or vomiting. SEEK IMMEDIATE MEDICAL CARE IF:   Your headache becomes severe.  You have a fever.  You have a stiff neck.  You have loss of vision.  You have muscular weakness or loss of muscle control.  You start losing your balance or have trouble walking.  You feel faint or pass out.  You have severe symptoms that are different from your first symptoms. MAKE SURE YOU:   Understand these instructions.  Will watch your condition.  Will get help right away if you are not doing well or get worse. Document Released: 07/16/2005 Document Revised: 10/08/2011 Document Reviewed: 08/01/2011 [facility] Patient Information 2015 [place], [place]. This information is not intended to replace advice given to you by your health care provider. Make sure you discuss any questions you have with your health care provider.

## 2014-05-20 LAB — URINE CULTURE

## 2014-05-21 NOTE — ED Provider Notes (Signed)
Medical screening examination/treatment/procedure(s) were performed by non-physician practitioner and as supervising physician I was immediately available for consultation/collaboration.   EKG Interpretation   Date/Time:  Wednesday May 19 2014 06:14:23 EDT Ventricular Rate:  81 PR Interval:  154 QRS Duration: 76 QT Interval:  389 QTC Calculation: 451 R Axis:   -41 Text Interpretation:  Sinus tachycardia Ventricular premature complex  Consider left atrial enlargement Left axis deviation Anterior infarct, old  Baseline wander in lead(s) V5 ED PHYSICIAN INTERPRETATION AVAILABLE IN  [facility] Confirmed by TEST, Record ([id number]) on 05/21/2014 6:51:39 AM       Raeford RazorStephen Lively Haberman, MD 05/21/14 [phone]

## 2014-05-31 ENCOUNTER — Encounter (HOSPITAL_COMMUNITY): Payer: Self-pay | Admitting: Emergency Medicine

## 2014-08-01 ENCOUNTER — Emergency Department (HOSPITAL_COMMUNITY)
Admission: EM | Admit: 2014-08-01 | Discharge: 2014-08-01 | Disposition: A | Discharge status: Home / Self Care | Payer: 59 | Attending: Emergency Medicine | Admitting: Emergency Medicine

## 2014-08-01 ENCOUNTER — Emergency Department (HOSPITAL_COMMUNITY): Payer: 59

## 2014-08-01 ENCOUNTER — Encounter (HOSPITAL_COMMUNITY): Payer: Self-pay | Admitting: Emergency Medicine

## 2014-08-01 DIAGNOSIS — M25561 Pain in right knee: Secondary | ICD-10-CM

## 2014-08-01 DIAGNOSIS — Z792 Long term (current) use of antibiotics: Secondary | ICD-10-CM | POA: Insufficient documentation

## 2014-08-01 DIAGNOSIS — Z87891 Personal history of nicotine dependence: Secondary | ICD-10-CM | POA: Diagnosis not present

## 2014-08-01 DIAGNOSIS — M1990 Unspecified osteoarthritis, unspecified site: Secondary | ICD-10-CM | POA: Diagnosis not present

## 2014-08-01 DIAGNOSIS — J45909 Unspecified asthma, uncomplicated: Secondary | ICD-10-CM | POA: Diagnosis not present

## 2014-08-01 DIAGNOSIS — Z79899 Other long term (current) drug therapy: Secondary | ICD-10-CM | POA: Diagnosis not present

## 2014-08-01 MED ORDER — NAPROXEN 500 MG PO TABS
500.0000 mg | ORAL_TABLET | Freq: Two times a day (BID) | ORAL | Status: DC
Start: 2014-08-01 — End: 2014-09-14

## 2014-08-01 NOTE — ED Provider Notes (Signed)
CSN: [id number]     Arrival date & time 08/01/14  0900 History   First MD Initiated Contact with Patient 08/01/14 [phone]     Chief Complaint  Patient presents with  . Knee Pain     (Consider location/radiation/quality/duration/timing/severity/associated sxs/prior Treatment) HPI Wendy James is a 44 y.o. female with a history of osteoarthritis who presents today for evaluation of right knee pain. Patient states for the past 3 months she has had intermittent right knee pain that she characterizes as "toothache". 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 She reports taking Tylenol with only some relief. Sitting still in keeping off of her feet improve her discomfort while walking and ambulating seem to exacerbate it. She denies fevers, injury or trauma to her knee, recent travel, surgeries, oral contraception, hemoptysis, history of blood clots.  Past Medical History  Diagnosis Date  . Asthma   . Seasonal allergies   . Arthritis    Past Surgical History  Procedure Laterality Date  . Cholecystectomy    . Cesarean section    . Tubal ligation     Family History  Problem Relation Age of Onset  . Hypertension Mother   . Diabetes Mother   . Cancer Mother    History  Substance Use Topics  . Smoking status: Former Smoker    Quit date: 11/17/1988  . Smokeless tobacco: Never Used  . Alcohol Use: No   OB History    Gravida Para Term Preterm AB TAB SAB Ectopic Multiple Living   0 1 0 1 0 0 3     Review of Systems  A 10 point review of systems was completed and was negative except for pertinent positives and negatives as mentioned in the history of present illness    Allergies  Shrimp  Home Medications   Prior to Admission medications   Medication Sig Start Date End Date Taking? Authorizing Provider  aspirin-acetaminophen-caffeine (EXCEDRIN MIGRAINE) [phone] MG per tablet Take 1 tablet by mouth every 6  (six) hours as needed for headache.   Yes Historical Provider, MD  traMADol (ULTRAM) 50 MG tablet Take 50 mg by mouth every 6 (six) hours as needed for moderate pain.   Yes Historical Provider, MD  albuterol (PROVENTIL HFA;VENTOLIN HFA) 108 (90 BASE) MCG/ACT inhaler Inhale 2-3 puffs into the lungs every 6 (six) hours as needed for wheezing or shortness of breath. Patient not taking: Reported on 08/01/2014 11/21/13   Leatha Gilding, MD  albuterol (PROVENTIL) (2.5 MG/3ML) 0.083% nebulizer solution Take 3 mLs (2.5 mg total) by nebulization every 6 (six) hours as needed for wheezing. For shortness of breath Patient not taking: Reported on 08/01/2014 11/21/13   Leatha Gilding, MD  HYDROcodone-acetaminophen (NORCO/VICODIN) 5-325 MG per tablet Take 1-2 tablets by mouth every 4 (four) hours as needed. Patient not taking: Reported on 08/01/2014 05/19/14   Melvenia Beam A Upstill, PA-C  metroNIDAZOLE (METROGEL VAGINAL) 0.75 % vaginal gel Place 1 Applicatorful vaginally 2 (two) times daily. Patient not taking: Reported on 08/01/2014 05/19/14   Melvenia Beam A Upstill, PA-C  naproxen (NAPROSYN) 500 MG tablet Take 1 tablet (500 mg total) by mouth 2 (two) times daily. 08/01/14   Earle Gell Daquisha Clermont, PA-C   BP 150/85 mmHg  Pulse 83  Temp(Src) 98.2 F (36.8 C) (Oral)  Resp 16  SpO2 99%  LMP 07/13/2014 Physical Exam  Constitutional: She is oriented to person,  place, and time. She appears well-developed and well-nourished.  HENT:  Head: Normocephalic and atraumatic.  Mouth/Throat: Oropharynx is clear and moist.  Eyes: Conjunctivae are normal. Pupils are equal, round, and reactive to light. Right eye exhibits no discharge. Left eye exhibits no discharge. No scleral icterus.  Neck: Neck supple.  Cardiovascular: Normal rate, regular rhythm and normal heart sounds.   Pulmonary/Chest: Effort normal and breath sounds normal. No respiratory distress. She has no wheezes. She has no rales.  Abdominal: Soft. There is no tenderness.   Musculoskeletal: She exhibits no tenderness.  Mild tenderness to joint lines in right knee. No effusion or edema appreciated. No erythema or overt warmth. No ligamentous laxity. Patient maintains full active range of motion. No tenderness along the venous system. No other obvious lesions or deformities.  Neurological: She is alert and oriented to person, place, and time.  Cranial Nerves II-XII grossly intact  Skin: Skin is warm and dry. No rash noted.  Psychiatric: She has a normal mood and affect.  Nursing note and vitals reviewed.   ED Course  Procedures (including critical care time) Labs Review Labs Reviewed - No data to display  Imaging Review Dg Knee Complete 4 Views Right  08/01/2014   CLINICAL DATA:  Right knee pain 2 months with no known injury; pain all over the knee, pain radiates to ankle and hip sometimes  EXAM: RIGHT KNEE - COMPLETE 4+ VIEW  COMPARISON:  None.  FINDINGS: There is no evidence of fracture, dislocation, or joint effusion. There is no evidence of arthropathy or other focal bone abnormality. Soft tissues are unremarkable.  IMPRESSION: Negative.   Electronically Signed   By: Oley Balm M.D.   On: 08/01/2014 10:21     EKG Interpretation None     Meds given in ED:  Medications - No data to display  Discharge Medication List as of 08/01/2014 11:33 AM    START taking these medications   Details  naproxen (NAPROSYN) 500 MG tablet Take 1 tablet (500 mg total) by mouth 2 (two) times daily., Starting 08/01/2014, Until Discontinued, Print       Filed Vitals:   08/01/14 0910  BP: 150/85  Pulse: 83  Temp: 98.2 F (36.8 C)  TempSrc: Oral  Resp: 16  SpO2: 99%    MDM  Wendy James is a 44 y.o. female with a history of osteoarthritis, obesity who presents today for evaluation of right knee pain. Patient denies any injury or trauma. She reports working for a [employer] and stands on her feet all day. Pain in her knee has been worsening over the  past 3 months. She has not discussed this with her primary care  Vitals stable  -afebrile Pt resting comfortably in ED. PE--Not concerning further acute or emergent pathology. Full ROM with no erythema, warmth or overt edema. Pt is able to ambulate independently without any ataxia.  Imaging--x-ray right found shows no acute fractures, dislocations or other osseous abnormalities  Low concern for hemarthrosis, septic joint or other acute or emergent causes of knee pain. Patient likely experiencing overuse injury. Will DC with NSAIDs, RICE therapy and instructions to follow-up with primary care.  Discussed f/u with PCP and return precautions, pt very amenable to plan. Patient stable, in good condition and is appropriate for discharge  Final diagnoses:  Right knee pain        Sharlene Motts, PA-C 08/02/14 1127  Earle Gell [place]hes, PA-C 08/02/14 1128  Raeford Razor, MD 08/03/14 [phone]

## 2014-08-01 NOTE — ED Notes (Signed)
Pt c/o right knee pain over the past several months, but sometimes pt states that pain gets so bad and it will give out on her when she tries to stand on it. Pt denies any injury.

## 2014-08-01 NOTE — Discharge Instructions (Signed)
Arthralgia °Your caregiver has diagnosed you as suffering from an arthralgia. Arthralgia means there is pain in a joint. This can come from many reasons including: °· Bruising the joint which causes soreness (inflammation) in the joint. °· Wear and tear on the joints which occur as we grow older (osteoarthritis). °· Overusing the joint. °· Various forms of arthritis. °· Infections of the joint. °Regardless of the cause of pain in your joint, most of these different pains respond to anti-inflammatory drugs and rest. The exception to this is when a joint is infected, and these cases are treated with antibiotics, if it is a bacterial infection. °HOME CARE INSTRUCTIONS  °· Rest the injured area for as long as directed by your caregiver. Then slowly start using the joint as directed by your caregiver and as the pain allows. Crutches as directed may be useful if the ankles, knees or hips are involved. If the knee was splinted or casted, continue use and care as directed. If an stretchy or elastic wrapping bandage has been applied today, it should be removed and re-applied every 3 to 4 hours. It should not be applied tightly, but firmly enough to keep swelling down. Watch toes and feet for swelling, bluish discoloration, coldness, numbness or excessive pain. If any of these problems (symptoms) occur, remove the ace bandage and re-apply more loosely. If these symptoms persist, contact your caregiver or return to this location. °· For the first 24 hours, keep the injured extremity elevated on pillows while lying down. °· Apply ice for 15-20 minutes to the sore joint every couple hours while awake for the first half day. Then 03-04 times per day for the first 48 hours. Put the ice in a plastic bag and place a towel between the bag of ice and your skin. °· Wear any splinting, casting, elastic bandage applications, or slings as instructed. °· Only take over-the-counter or prescription medicines for pain, discomfort, or fever as  directed by your caregiver. Do not use aspirin immediately after the injury unless instructed by your physician. Aspirin can cause increased bleeding and bruising of the tissues. °· If you were given crutches, continue to use them as instructed and do not resume weight bearing on the sore joint until instructed. °Persistent pain and inability to use the sore joint as directed for more than 2 to 3 days are warning signs indicating that you should see a caregiver for a follow-up visit as soon as possible. Initially, a hairline fracture (break in bone) may not be evident on X-rays. Persistent pain and swelling indicate that further evaluation, non-weight bearing or use of the joint (use of crutches or slings as instructed), or further X-rays are indicated. X-rays may sometimes not show a small fracture until a week or 10 days later. Make a follow-up appointment with your own caregiver or one to whom we have referred you. A radiologist (specialist in reading X-rays) may read your X-rays. Make sure you know how you are to obtain your X-ray results. Do not assume everything is normal if you do not hear from us. °SEEK MEDICAL CARE IF: °Bruising, swelling, or pain increases. °SEEK IMMEDIATE MEDICAL CARE IF:  °· Your fingers or toes are numb or blue. °· The pain is not responding to medications and continues to stay the same or get worse. °· The pain in your joint becomes severe. °· You develop a fever over 102° F (38.9° C). °· It becomes impossible to move or use the joint. °MAKE SURE YOU:  °·   Understand these instructions.  Will watch your condition.  Will get help right away if you are not doing well or get worse. Document Released: 07/16/2005 Document Revised: 10/08/2011 Document Reviewed: 03/03/2008 [facility] Patient Information 2015 [place], [place]. This information is not intended to replace advice given to you by your health care provider. Make sure you discuss any questions you have with your health care  provider.    Emergency Department Resource Guide 1) Find a Doctor and Pay Out of Pocket Although you won't have to find out who is covered by your insurance plan, it is a good idea to ask around and get recommendations. You will then need to call the office and see if the doctor you have chosen will accept you as a new patient and what types of options they offer for patients who are self-pay. Some doctors offer discounts or will set up payment plans for their patients who do not have insurance, but you will need to ask so you aren't surprised when you get to your appointment.  2) Contact Your Local Health Department Not all health departments have doctors that can see patients for sick visits, but many do, so it is worth a call to see if yours does. If you don't know where your local health department is, you can check in your phone book. The CDC also has a tool to help you locate your state's health department, and many state websites also have listings of all of their local health departments.  3) Find a [facility] If your illness is not likely to be very severe or complicated, you may want to try a walk in clinic. These are popping up all over the country in pharmacies, drugstores, and shopping centers. They're usually staffed by nurse practitioners or physician assistants that have been trained to treat common illnesses and complaints. They're usually fairly quick and inexpensive. However, if you have serious medical issues or chronic medical problems, these are probably not your best option.  No Primary Care Doctor: - Call Health Connect at  [phone] - they can help you locate a primary care doctor that  accepts your insurance, provides certain services, etc. - Physician Referral Service- [phone]  Chronic Pain Problems: Organization         Address  Phone   Notes  [facility]  [phone] Patients need to be referred by their primary care doctor.    Medication Assistance: Organization         Address  Phone   Notes  [facility] Medication [facility] [address] [place], [place] [address] [phone] --Must be a resident of [facility] -- Must have NO insurance coverage whatsoever (no Medicaid/ Medicare, etc.) -- The pt. MUST have a primary care doctor that directs their care regularly and follows them in the community   [facility]  [phone]   [employer]  [phone]    Agencies that provide inexpensive medical care: Organization         Address  Phone   Notes  [place]  [phone]   [facility]    [phone]   [facility] [place], [place] [address] [phone]   Viola Whitley [phone]   [facility]anned Parenthood    [phone]   [facility]    [phone]   [facility]  [address], [place] Phone:  ([phone], Fax:  [phone] Hours of Operation:  9 am - 6 pm, M-F.  Also accepts Medicaid/Medicare and self-pay.  °[facility] ° [address], [place] Phone: ([phone]) [phone], Fax: [phone]. Hours of Operation:  8:30 am - 5:30 pm, M-F.  Also accepts Medicaid and self-pay.  °[facility] [place] [address], [place] Phone: ([phone]) [phone]   °[facility] [address], [place], [place] (336)[phone], Ext. 123 Mondays & Thursdays: 7-9 AM.  First 15 patients are seen on a first come, first serve basis. °  ° °Medicaid-accepting [place] Providers: ° °Organization         Address  Phone   Notes  °[facility] [address], [place] ([phone]) [phone] Also accepts self-pay patients.  °[facility] [address], [place] ° (336) [phone]   °[facility] [address], [place] (336) [phone]   °[facility] [address] [place] [address], [place] (336) [phone]   °Veita Bland [address], [place]  ° ([phone]) [phone] Only accepts [place] Access Medicaid patients after they have their name applied to their card.  ° °Self-Pay (no insurance) in [place]: ° °Organization         Address  Phone   Notes  °Sickle Cell Patients, [facility] [address], [place] ([phone]) [phone]   °[facility] [address], [place] (336) [phone]   °[facility] ° [address], [place] (336) [phone]   °[facility]/Dr. Osei-Bonsu ° [address] [place] [address], [place] or [address], [place] ([phone]) [phone] Phone number for both [place] and [place] locations is the same.  °[facility] [address], [place] ([phone]) [phone]   °[facility] [address] [place] [address], [place] or [address] (336) [phone] °(336) [phone]   °[facility] [address], [place] (336) [phone], phone; ([phone]) [phone], fax Sees patients 1st and 3rd Saturday of every month.  Must not qualify for public or private insurance (i.e. Medicaid, Medicare, [place] Health Choice, Veterans' Benefits) • Household income should be no more than 200% of the poverty level •The clinic cannot treat you if you are pregnant or think you are pregnant • Sexually transmitted diseases are not treated at the clinic.  ° ° °Dental Care: °Organization         Address  Phone  Notes  °[place] [facility] [address], [place] (336) [phone] Accepts children up to age 21 who are enrolled in Medicaid or [place] Health Choice; pregnant women with a Medicaid card; and children who have applied for Medicaid or [place] Health Choice, but were declined, whose parents can pay a reduced fee at time of service.    °[place] [facility] [place]  [address], [place] (336) 641-7733 Accepts children up to age 21 who are enrolled in Medicaid or [place] Health Choice; pregnant women with a Medicaid card; and children who have applied for Medicaid or [place] Health Choice, but were declined, whose parents can pay a reduced fee at time of service.  °[facility] ° [address], [place] ([phone])   [phone] Patients are seen by appointment only. Walk-ins are not accepted. [facility] will see patients 18 years of age and older. °Monday - Tuesday (8am-5pm) °Most Wednesdays (8:30-5pm) °$30 per visit, cash only  °[facility] ° [address], [place] ([phone]) [phone] Patients are seen by appointment only. Walk-ins are not accepted. [facility] will see patients 18 years of age and older. °One Wednesday Evening (Monthly: Volunteer Based).  $30 per visit, cash only  °[facility]  ([phone]) [phone] for adults; Children under age 4, call Graduate Pediatric Dentistry at ([phone]. Children aged 4-14, please call [phone]) [phone] to request a pediatric application. ° Dental services are provided in all areas of dental care including fillings, crowns and bridges, complete and partial dentures, implants, gum treatment, root canals, and extractions. Preventive care is also provided. Treatment is provided to both adults and children. °Patients are selected via a lottery and there is often a waiting list. °  °[facility] [address], °[place] ° (336) 763-8833 [email or web address] °  °[facility] [address], [place], [place] (336)723-1848, Ext. 123 Second and Fourth Thursday of each month, opens at 6:30 AM; Clinic ends at 9 AM.  Patients are seen on a first-come first-served basis, and a limited number are seen during each clinic.  ° °[facility] ° [address], [place], [place] (336)  [phone]   Eligibility Requirements °You must have lived in [place], [place], or [place] counties for at least the last three months. °  You cannot be eligible for state or federal sponsored healthcare insurance, including Veterans Administration, Medicaid, or Medicare. °  You generally cannot be eligible for healthcare insurance through your employer.  °  How to apply: °Eligibility screenings are held every Tuesday and Wednesday afternoon from 1:00 pm until 4:00 pm. You do not need an appointment for the interview!  °[facility] [address], [place], [place] 336-631-2330   °[facility] [facility]  336-342-8273   °[place] [facility]  336-703-3100   ° [facility]  336-570-6415   ° °Behavioral Health Resources in the Community: °Intensive Outpatient Programs °Organization         Address  Phone  Notes  °[facility] [address], [place], [place] [phone]   °[facility] [address], [place], [place] 336-832-9800   °[facility] [address], [place], [place] ° 336-882-2125   °[facility] [address],  °[place], [place] 1-800-853-5163 or 336-641-4981   °Substance Abuse Resources °Organization         Address  Phone  Notes  °[facility]  336-882-2125   °[facility]  336-784-9470   °[facility] [place] [facility]  336-285-9073   °[facility]  336-845-3988   °Residential & Outpatient Substance Abuse Program  1-800-659-3381   °Psychological Services °Organization         Address  Phone  Notes  °[facility]  336- 832-9600   °[facility]  336- 378-7881   °[facility] [address], [place] 1-800-853-5163 or 336-641-4981   ° °Mobile Crisis Teams °Organization         Address  Phone  Notes  °[facility], Mobile Crisis Care Unit  1-877-626-1772   °Assertive °[facility] ° [address] [place], [place] 336-834-9664   °Sharon DeEsch [address]   [address] [place] [address] [place] [phone]    Self-Help/Support Groups Organization         Address  Phone             Notes  [facility] [place] [facility] - variety of support groups  336- I7437963 Call for more information  Narcotics Anonymous (NA), [facility] [address], [employer] [place]  2 meetings at this location   Statistician         Address  Phone  Notes  [facility] [address],    [place]  [phone]   [facility]  [address], [place] [address], [place], [place] [phone]   [facility] [address] [place], [place] [phone] Admissions: 8am-3pm M-F  [facility] [address] [place] [address]    [place], [place] [phone]   [facility] [address] [place], [place], [place] [phone]   [facility] [address],  [place], [place] [phone]   [facility] Intensive Outpatient [address], [place], [place] 109-323-5573   [facility] ([facility] [address] [place]  [place], [place] 2-202-542-7062 or 505-574-3655   [facility] [address], [place], [place] 616-073-7106 Accepts Medicaid  [facility] [address]  [place] 2-694-854-6270 Substance Abuse/Addiction Treatment   [facility] Organization         Address  Phone  Notes  [facility]  478-244-5898   Angie Fava, PhD [address] [place] [place] [employer], [place]   (671) 525-9650 or [phone]   [facility]   [address] [place], [place] ([phone]) [phone]   [facility] [address] [place], [place] (623)026-4000 Insurance/Medicaid/sponsorship through [facility] [address]                                    [place], [place] (607)[phone]  Therapy/tele-psych/case  [facility] [address] [place], [place] [phone]    Dr. Lolly Mustache  [phone]   [facility] [place]  [employer] [facility] Dept. 1) [address], [place] 2) [address], [place] 3)  [address], [place] [phone]  [phone]   [facility] Child Abuse Hotline (220) 319-4472 or 6205995870 (After Hours)      Is important for you to follow-up with primary care for further evaluation and management of your symptoms. You may take the naproxen to relieve any pain or inflammation he may experience. Return to ED for worsening symptoms

## 2014-08-01 NOTE — ED Notes (Signed)
She is in no distress; and thanks us for our care. 

## 2014-08-10 ENCOUNTER — Encounter (HOSPITAL_COMMUNITY): Payer: Self-pay | Admitting: *Deleted

## 2014-08-10 ENCOUNTER — Emergency Department (HOSPITAL_COMMUNITY)
Admission: EM | Admit: 2014-08-10 | Discharge: 2014-08-10 | Disposition: A | Discharge status: Home / Self Care | Payer: 59 | Source: Home / Self Care | Attending: Family Medicine | Admitting: Family Medicine

## 2014-08-10 DIAGNOSIS — M7581 Other shoulder lesions, right shoulder: Principal | ICD-10-CM

## 2014-08-10 DIAGNOSIS — M778 Other enthesopathies, not elsewhere classified: Secondary | ICD-10-CM

## 2014-08-10 MED ORDER — TRIAMCINOLONE ACETONIDE 40 MG/ML IJ SUSP
40.0000 mg | Freq: Once | INTRAMUSCULAR | Status: AC
Start: 2014-08-10 — End: 2014-08-10
  Administered 2014-08-10: 40 mg via INTRAMUSCULAR

## 2014-08-10 MED ORDER — DICLOFENAC SODIUM 1 % TD GEL: 4.0000 g | Freq: Four times a day (QID) | TRANSDERMAL | Status: DC

## 2014-08-10 MED ORDER — TRIAMCINOLONE ACETONIDE 40 MG/ML IJ SUSP
INTRAMUSCULAR | Status: AC
  Filled 2014-08-10: qty 1

## 2014-08-10 NOTE — Discharge Instructions (Signed)
Ice, sling and medicine as prescribed

## 2014-08-10 NOTE — ED Notes (Signed)
Med   r    Arm   Sling   Applied

## 2014-08-10 NOTE — ED Provider Notes (Signed)
CSN: [id number]     Arrival date & time 08/10/14  1534 History   First MD Initiated Contact with Patient 08/10/14 1601     Chief Complaint  Patient presents with  . Extremity Weakness   (Consider location/radiation/quality/duration/timing/severity/associated sxs/prior Treatment) Patient is a 44 y.o. female presenting with extremity weakness. The history is provided by the patient.  Extremity Weakness This is a new problem. The current episode started yesterday. The problem has been gradually worsening. Pertinent negatives include no chest pain, no abdominal pain, no headaches and no shortness of breath.    Past Medical History  Diagnosis Date  . Asthma   . Seasonal allergies   . Arthritis    Past Surgical History  Procedure Laterality Date  . Cholecystectomy    . Cesarean section    . Tubal ligation     Family History  Problem Relation Age of Onset  . Hypertension Mother   . Diabetes Mother   . Cancer Mother    History  Substance Use Topics  . Smoking status: Former Smoker    Quit date: 11/17/1988  . Smokeless tobacco: Never Used  . Alcohol Use: No   OB History    Gravida Para Term Preterm AB TAB SAB Ectopic Multiple Living   0 1 0 1 0 0 3     Review of Systems  Constitutional: Negative.   Respiratory: Negative for shortness of breath.   Cardiovascular: Negative for chest pain.  Gastrointestinal: Negative for abdominal pain.  Musculoskeletal: Positive for myalgias, arthralgias and extremity weakness. Negative for back pain, joint swelling, neck pain and neck stiffness.  Skin: Negative.   Neurological: Negative for headaches.    Allergies  Shrimp  Home Medications   Prior to Admission medications   Medication Sig Start Date End Date Taking? Authorizing Provider  albuterol (PROVENTIL HFA;VENTOLIN HFA) 108 (90 BASE) MCG/ACT inhaler Inhale 2-3 puffs into the lungs every 6 (six) hours as needed for wheezing or shortness of breath. Patient not taking:  Reported on 08/01/2014 11/21/13   Leatha Gilding, MD  albuterol (PROVENTIL) (2.5 MG/3ML) 0.083% nebulizer solution Take 3 mLs (2.5 mg total) by nebulization every 6 (six) hours as needed for wheezing. For shortness of breath Patient not taking: Reported on 08/01/2014 11/21/13   Leatha Gilding, MD  aspirin-acetaminophen-caffeine (EXCEDRIN MIGRAINE) [phone] MG per tablet Take 1 tablet by mouth every 6 (six) hours as needed for headache.    Historical Provider, MD  diclofenac sodium (VOLTAREN) 1 % GEL Apply 4 g topically 4 (four) times daily. To shoulder --please instruct in dosage. 08/10/14   Linna Hoff, MD  HYDROcodone-acetaminophen (NORCO/VICODIN) 5-325 MG per tablet Take 1-2 tablets by mouth every 4 (four) hours as needed. Patient not taking: Reported on 08/01/2014 05/19/14   Melvenia Beam A Upstill, PA-C  metroNIDAZOLE (METROGEL VAGINAL) 0.75 % vaginal gel Place 1 Applicatorful vaginally 2 (two) times daily. Patient not taking: Reported on 08/01/2014 05/19/14   Melvenia Beam A Upstill, PA-C  naproxen (NAPROSYN) 500 MG tablet Take 1 tablet (500 mg total) by mouth 2 (two) times daily. 08/01/14   Earle Gell Cartner, PA-C  traMADol (ULTRAM) 50 MG tablet Take 50 mg by mouth every 6 (six) hours as needed for moderate pain.    Historical Provider, MD   BP 156/94 mmHg  Pulse 73  Temp(Src) 99.2 F (37.3 C) (Oral)  Resp 12  SpO2 99%  LMP 07/13/2014 Physical Exam  Constitutional: She is oriented to person, place, and time. She  appears well-developed and well-nourished.  Musculoskeletal: She exhibits tenderness.       Right shoulder: She exhibits decreased range of motion, tenderness, pain and decreased strength. She exhibits no swelling, no effusion, no crepitus and normal pulse.       Right wrist: She exhibits tenderness. She exhibits no crepitus.       Arms: Neurological: She is alert and oriented to person, place, and time.  Skin: Skin is warm and dry.  Nursing note and vitals reviewed.   ED Course  Procedures  (including critical care time) Labs Review Labs Reviewed - No data to display  Imaging Review No results found.   MDM   1. Tendonitis of shoulder, right        Linna HoffJames D Yanai Hobson, MD 08/10/14 [phone]

## 2014-08-10 NOTE — ED Notes (Signed)
Pt  Reports    r  Arm  painfull  With    Some  Weakness   -  She  denys  Any     Known  Injury      Symptoms  X  sev  Week  Hand  Grip    Is  Moderate        She  Is sitting  Upright on  Exam table  Speaking  In  Complete  sentances   And  Is  In no  Distress

## 2014-09-09 ENCOUNTER — Encounter (HOSPITAL_COMMUNITY): Payer: Self-pay | Admitting: Emergency Medicine

## 2014-09-09 ENCOUNTER — Emergency Department (HOSPITAL_COMMUNITY): Payer: 59

## 2014-09-09 ENCOUNTER — Emergency Department (HOSPITAL_COMMUNITY)
Admission: EM | Admit: 2014-09-09 | Discharge: 2014-09-09 | Disposition: A | Discharge status: Home / Self Care | Payer: 59 | Attending: Emergency Medicine | Admitting: Emergency Medicine

## 2014-09-09 DIAGNOSIS — Z791 Long term (current) use of non-steroidal anti-inflammatories (NSAID): Secondary | ICD-10-CM | POA: Diagnosis not present

## 2014-09-09 DIAGNOSIS — J45901 Unspecified asthma with (acute) exacerbation: Secondary | ICD-10-CM | POA: Insufficient documentation

## 2014-09-09 DIAGNOSIS — R059 Cough, unspecified: Secondary | ICD-10-CM

## 2014-09-09 DIAGNOSIS — M1990 Unspecified osteoarthritis, unspecified site: Secondary | ICD-10-CM | POA: Diagnosis not present

## 2014-09-09 DIAGNOSIS — Z87891 Personal history of nicotine dependence: Secondary | ICD-10-CM | POA: Insufficient documentation

## 2014-09-09 DIAGNOSIS — J45909 Unspecified asthma, uncomplicated: Secondary | ICD-10-CM | POA: Diagnosis present

## 2014-09-09 DIAGNOSIS — Z79899 Other long term (current) drug therapy: Secondary | ICD-10-CM | POA: Insufficient documentation

## 2014-09-09 DIAGNOSIS — Z792 Long term (current) use of antibiotics: Secondary | ICD-10-CM | POA: Diagnosis not present

## 2014-09-09 DIAGNOSIS — R05 Cough: Secondary | ICD-10-CM

## 2014-09-09 LAB — CBC WITH DIFFERENTIAL/PLATELET
BASOS PCT: 0 % (ref 0–1)
Basophils Absolute: 0 10*3/uL (ref 0.0–0.1)
Eosinophils Absolute: 0 10*3/uL (ref 0.0–0.7)
Eosinophils Relative: 1 % (ref 0–5)
HEMATOCRIT: 35.9 % — AB (ref 36.0–46.0)
HEMOGLOBIN: 11.3 g/dL — AB (ref 12.0–15.0)
Lymphocytes Relative: 26 % (ref 12–46)
Lymphs Abs: 1.1 10*3/uL (ref 0.7–4.0)
MCH: 27.6 pg (ref 26.0–34.0)
MCHC: 31.5 g/dL (ref 30.0–36.0)
MCV: 87.8 fL (ref 78.0–100.0)
MONOS PCT: 2 % — AB (ref 3–12)
Monocytes Absolute: 0.1 10*3/uL (ref 0.1–1.0)
Neutro Abs: 3.1 10*3/uL (ref 1.7–7.7)
Neutrophils Relative %: 71 % (ref 43–77)
PLATELETS: 203 10*3/uL (ref 150–400)
RBC: 4.09 MIL/uL (ref 3.87–5.11)
RDW: 15.7 % — ABNORMAL HIGH (ref 11.5–15.5)
WBC: 4.4 10*3/uL (ref 4.0–10.5)

## 2014-09-09 LAB — I-STAT CHEM 8, ED
BUN: 10 mg/dL (ref 6–23)
CHLORIDE: 104 mmol/L (ref 96–112)
Calcium, Ion: 1.13 mmol/L (ref 1.12–1.23)
Creatinine, Ser: 0.6 mg/dL (ref 0.50–1.10)
Glucose, Bld: 136 mg/dL — ABNORMAL HIGH (ref 70–99)
HCT: 38 % (ref 36.0–46.0)
Hemoglobin: 12.9 g/dL (ref 12.0–15.0)
Potassium: 3.3 mmol/L — ABNORMAL LOW (ref 3.5–5.1)
Sodium: 141 mmol/L (ref 135–145)
TCO2: 19 mmol/L (ref 0–100)

## 2014-09-09 MED ORDER — PREDNISONE 20 MG PO TABS: 40.0000 mg | ORAL_TABLET | Freq: Every day | ORAL | Status: DC

## 2014-09-09 MED ORDER — ALBUTEROL (5 MG/ML) CONTINUOUS INHALATION SOLN
10.0000 mg/h | INHALATION_SOLUTION | RESPIRATORY_TRACT | Status: AC
Start: 2014-09-09 — End: 2014-09-09
  Administered 2014-09-09: 10 mg/h via RESPIRATORY_TRACT
  Filled 2014-09-09: qty 20

## 2014-09-09 MED ORDER — ALBUTEROL SULFATE HFA 108 (90 BASE) MCG/ACT IN AERS: 2.0000 | INHALATION_SPRAY | RESPIRATORY_TRACT | Status: DC | PRN

## 2014-09-09 MED ORDER — PREDNISONE 20 MG PO TABS
60.0000 mg | ORAL_TABLET | Freq: Once | ORAL | Status: AC
  Administered 2014-09-09: 60 mg via ORAL
  Filled 2014-09-09: qty 3

## 2014-09-09 MED ORDER — IPRATROPIUM-ALBUTEROL 0.5-2.5 (3) MG/3ML IN SOLN
3.0000 mL | Freq: Once | RESPIRATORY_TRACT | Status: AC
  Administered 2014-09-09: 3 mL via RESPIRATORY_TRACT
  Filled 2014-09-09: qty 3

## 2014-09-09 MED ORDER — ALBUTEROL SULFATE (2.5 MG/3ML) 0.083% IN NEBU
5.0000 mg | INHALATION_SOLUTION | Freq: Once | RESPIRATORY_TRACT | Status: AC
  Administered 2014-09-09: 5 mg via RESPIRATORY_TRACT
  Filled 2014-09-09: qty 6

## 2014-09-09 MED ORDER — ALBUTEROL SULFATE HFA 108 (90 BASE) MCG/ACT IN AERS
2.0000 | INHALATION_SPRAY | RESPIRATORY_TRACT | Status: DC | PRN
  Filled 2014-09-09: qty 6.7

## 2014-09-09 MED ORDER — IPRATROPIUM BROMIDE 0.02 % IN SOLN
0.5000 mg | Freq: Once | RESPIRATORY_TRACT | Status: AC
  Administered 2014-09-09: 0.5 mg via RESPIRATORY_TRACT
  Filled 2014-09-09: qty 2.5

## 2014-09-09 NOTE — ED Notes (Signed)
Awake. Verbally responsive. A/O x4. Resp even and slightly labored with exertion. Continues to have audible exp wheezing. ABC's intact.

## 2014-09-09 NOTE — ED Notes (Signed)
Pt states she feels a "little bit" better after breathing tx's, PA made aware.

## 2014-09-09 NOTE — ED Notes (Signed)
Pt ambulated with sats at 94-97% RA and HR at 88-101bpm. Pt continues to have audible exp wheezing with nonproductive cough noted.

## 2014-09-09 NOTE — ED Notes (Signed)
Pt presents to ED with asthma attack  Pt is short of breath upon arrival with audible wheezing noted  Pt states she has been having problems with her asthma for about a week but it has gotten worse over the last day  Pt states her inhaler is not helping

## 2014-09-09 NOTE — ED Provider Notes (Signed)
CSN: [id number]     Arrival date & time 09/09/14  0602 History   First MD Initiated Contact with Patient 09/09/14 [phone]     Chief Complaint  Patient presents with  . Asthma     (Consider location/radiation/quality/duration/timing/severity/associated sxs/prior Treatment) HPI Comments: Patient presents to the ED with a chief complaint of asthma exacerbation.  She has been having the symptoms for the past week.  She states that she has been using her inhaler with no relief.  She denies any fevers, chills, or productive cough.  She states that she has had to be admitted for similar exacerbations in the past.  She has never had to be intubated.  She does not smoke.  The history is provided by the patient. No language interpreter was used.    Past Medical History  Diagnosis Date  . Asthma   . Seasonal allergies   . Arthritis    Past Surgical History  Procedure Laterality Date  . Cholecystectomy    . Cesarean section    . Tubal ligation     Family History  Problem Relation Age of Onset  . Hypertension Mother   . Diabetes Mother   . Cancer Mother    History  Substance Use Topics  . Smoking status: Former Smoker    Quit date: 11/17/1988  . Smokeless tobacco: Never Used  . Alcohol Use: No   OB History    Gravida Para Term Preterm AB TAB SAB Ectopic Multiple Living   5 4 4  0 1 0 1 0 0 3     Review of Systems  Constitutional: Negative for fever and chills.  Respiratory: Positive for chest tightness, shortness of breath and wheezing.   Cardiovascular: Negative for chest pain.  Gastrointestinal: Negative for nausea, vomiting, diarrhea and constipation.  Genitourinary: Negative for dysuria.  All other systems reviewed and are negative.     Allergies  Shrimp  Home Medications   Prior to Admission medications   Medication Sig Start Date End Date Taking? Authorizing Provider  albuterol (PROVENTIL HFA;VENTOLIN HFA) 108 (90 BASE) MCG/ACT inhaler Inhale 2-3 puffs into the  lungs every 6 (six) hours as needed for wheezing or shortness of breath. Patient not taking: Reported on 08/01/2014 11/21/13   Leatha Gildingostin M Gherghe, MD  albuterol (PROVENTIL) (2.5 MG/3ML) 0.083% nebulizer solution Take 3 mLs (2.5 mg total) by nebulization every 6 (six) hours as needed for wheezing. For shortness of breath Patient not taking: Reported on 08/01/2014 11/21/13   Leatha Gildingostin M Gherghe, MD  aspirin-acetaminophen-caffeine (EXCEDRIN MIGRAINE) [phone] MG per tablet Take 1 tablet by mouth every 6 (six) hours as needed for headache.    Historical Provider, MD  diclofenac sodium (VOLTAREN) 1 % GEL Apply 4 g topically 4 (four) times daily. To shoulder --please instruct in dosage. 08/10/14   Linna HoffJames D Kindl, MD  HYDROcodone-acetaminophen (NORCO/VICODIN) 5-325 MG per tablet Take 1-2 tablets by mouth every 4 (four) hours as needed. Patient not taking: Reported on 08/01/2014 05/19/14   Melvenia BeamShari A Upstill, PA-C  metroNIDAZOLE (METROGEL VAGINAL) 0.75 % vaginal gel Place 1 Applicatorful vaginally 2 (two) times daily. Patient not taking: Reported on 08/01/2014 05/19/14   Melvenia BeamShari A Upstill, PA-C  naproxen (NAPROSYN) 500 MG tablet Take 1 tablet (500 mg total) by mouth 2 (two) times daily. 08/01/14   Earle GellBenjamin W Cartner, PA-C  traMADol (ULTRAM) 50 MG tablet Take 50 mg by mouth every 6 (six) hours as needed for moderate pain.    Historical Provider, MD   BP 141/96  mmHg  Pulse 76  Temp(Src) 98.8 F (37.1 C) (Oral)  Resp 16  SpO2 98% Physical Exam  Constitutional: She is oriented to person, place, and time. She appears well-developed and well-nourished.  HENT:  Head: Normocephalic and atraumatic.  Eyes: Conjunctivae and EOM are normal. Pupils are equal, round, and reactive to light.  Neck: Normal range of motion. Neck supple.  Cardiovascular: Normal rate and regular rhythm.  Exam reveals no gallop and no friction rub.   No murmur heard. Pulmonary/Chest: She is in respiratory distress. She has wheezes. She has no rales. She  exhibits no tenderness.  Accessory muscle use, increased work of breathing, speaks in 2-3 word phrases, audible wheezes, diffuse wheezing with auscultation  Abdominal: Soft. Bowel sounds are normal. She exhibits no distension and no mass. There is no tenderness. There is no rebound and no guarding.  Musculoskeletal: Normal range of motion. She exhibits no edema or tenderness.  Neurological: She is alert and oriented to person, place, and time.  Skin: Skin is warm and dry.  Psychiatric: She has a normal mood and affect. Her behavior is normal. Judgment and thought content normal.  Nursing note and vitals reviewed.   ED Course  Procedures (including critical care time) Labs Review Labs Reviewed  CBC WITH DIFFERENTIAL/PLATELET - Abnormal; Notable for the following:    Hemoglobin 11.3 (*)    HCT 35.9 (*)    RDW 15.7 (*)    Monocytes Relative 2 (*)    All other components within normal limits  I-STAT CHEM 8, ED - Abnormal; Notable for the following:    Potassium 3.3 (*)    Glucose, Bld 136 (*)    All other components within normal limits    Imaging Review Dg Chest 2 View  09/09/2014   CLINICAL DATA:  Cough, chest congestion, and shortness of breath. Mid chest pain.  EXAM: CHEST  2 VIEW  COMPARISON:  11/17/2013  FINDINGS: Heart size and pulmonary vascularity are normal and the lungs are clear except for minimal linear atelectasis of the right lung base. No effusions. No osseous abnormality.  IMPRESSION: Minimal atelectasis at the right lung base.   Electronically Signed   By: Francene Boyers M.D.   On: 09/09/2014 09:22     EKG Interpretation None      MDM   Final diagnoses:  Cough  Asthma exacerbation    Patient with asthma exacerbation.  Will give prednisone, nebs, and reassess.  No fever or productive cough.  Doubt pneumonia.  Improved after two nebs, but still having wheezing.  Will order CAT neb.  Reassessed after CAT, wheezing has improved somewhat.  Will ambulate with  pulse ox.  Patient ambulates well, and maintains sufficient O2 sat, but still appears very uncomfortable.  She still had return of audible wheezes after ambulation.  Still having accessory muscle use and increased work of breathing.  Will check CXR and labs.  Consider admission.  9:56 AM Patient is feeling better and looks better.  I think the prednisone has started to kick in.  Discussed admission with the patient, but she states that she wants to go home.  She feels well enough to do so.  Will DC to home with prednisone and albuterol.   Roxy Horseman, PA-C 09/09/14 0957  Loren Racer, MD 09/10/14 [phone]

## 2014-09-09 NOTE — ED Notes (Signed)
Awake. Verbally responsive. A/O x4. Resp even and slightly labored. Currently receiving continuous neb tx.  ABC's intact. SR on monitor at 79 bpm.

## 2014-09-09 NOTE — ED Notes (Signed)
Attempted to start IV on pt, stuck her once, pt refused to let RN stick her again. Another RN to try.

## 2014-09-09 NOTE — ED Notes (Signed)
Awake. Verbally responsive. A/O x4. Resp even and slightly labored. Audible exp wheezing noted. Auscultated clear breath sounds in bil lower lobes. Pt reported occ productive cough of white sputum. ABC's intact. SR on monitor at 92 bpm. Family at bedside.

## 2014-09-09 NOTE — ED Notes (Signed)
Pt given Albuterol HFA medication at d/c.

## 2014-09-09 NOTE — ED Notes (Addendum)
Awake. Verbally responsive. A/O x4. Resp even and slightly labored with exertion. Continues to have exp wheezing. ABC's intact. SR on monitor at 71bpm.

## 2014-09-09 NOTE — Discharge Instructions (Signed)

## 2014-09-14 ENCOUNTER — Encounter (HOSPITAL_COMMUNITY): Payer: Self-pay | Admitting: Family Medicine

## 2014-09-14 ENCOUNTER — Emergency Department (HOSPITAL_COMMUNITY)
Admission: EM | Admit: 2014-09-14 | Discharge: 2014-09-14 | Disposition: A | Discharge status: Home / Self Care | Payer: 59 | Attending: Emergency Medicine | Admitting: Emergency Medicine

## 2014-09-14 ENCOUNTER — Emergency Department (HOSPITAL_COMMUNITY): Payer: 59

## 2014-09-14 DIAGNOSIS — M1990 Unspecified osteoarthritis, unspecified site: Secondary | ICD-10-CM | POA: Insufficient documentation

## 2014-09-14 DIAGNOSIS — R0602 Shortness of breath: Secondary | ICD-10-CM | POA: Diagnosis present

## 2014-09-14 DIAGNOSIS — Z87891 Personal history of nicotine dependence: Secondary | ICD-10-CM | POA: Insufficient documentation

## 2014-09-14 DIAGNOSIS — J45901 Unspecified asthma with (acute) exacerbation: Secondary | ICD-10-CM

## 2014-09-14 LAB — CBC WITH DIFFERENTIAL/PLATELET
BASOS PCT: 0 % (ref 0–1)
Basophils Absolute: 0 10*3/uL (ref 0.0–0.1)
Eosinophils Absolute: 0.2 10*3/uL (ref 0.0–0.7)
Eosinophils Relative: 4 % (ref 0–5)
HEMATOCRIT: 34.5 % — AB (ref 36.0–46.0)
HEMOGLOBIN: 10.9 g/dL — AB (ref 12.0–15.0)
LYMPHS PCT: 33 % (ref 12–46)
Lymphs Abs: 1.8 10*3/uL (ref 0.7–4.0)
MCH: 27.7 pg (ref 26.0–34.0)
MCHC: 31.6 g/dL (ref 30.0–36.0)
MCV: 87.6 fL (ref 78.0–100.0)
MONO ABS: 0.5 10*3/uL (ref 0.1–1.0)
Monocytes Relative: 9 % (ref 3–12)
Neutro Abs: 3 10*3/uL (ref 1.7–7.7)
Neutrophils Relative %: 54 % (ref 43–77)
Platelets: 204 10*3/uL (ref 150–400)
RBC: 3.94 MIL/uL (ref 3.87–5.11)
RDW: 15.7 % — ABNORMAL HIGH (ref 11.5–15.5)
WBC: 5.5 10*3/uL (ref 4.0–10.5)

## 2014-09-14 LAB — BLOOD GAS, VENOUS
Acid-Base Excess: 0.7 mmol/L (ref 0.0–2.0)
Bicarbonate: 24.6 mEq/L — ABNORMAL HIGH (ref 20.0–24.0)
FIO2: 0.21 %
O2 Saturation: 90.3 %
PCO2 VEN: 38.7 mmHg — AB (ref 45.0–50.0)
PO2 VEN: 60.5 mmHg — AB (ref 30.0–45.0)
Patient temperature: 98.6
TCO2: 22.3 mmol/L (ref 0–100)
pH, Ven: 7.419 — ABNORMAL HIGH (ref 7.250–7.300)

## 2014-09-14 LAB — I-STAT CHEM 8, ED
BUN: 9 mg/dL (ref 6–23)
Calcium, Ion: 1.11 mmol/L — ABNORMAL LOW (ref 1.12–1.23)
Chloride: 104 mmol/L (ref 96–112)
Creatinine, Ser: 0.7 mg/dL (ref 0.50–1.10)
Glucose, Bld: 110 mg/dL — ABNORMAL HIGH (ref 70–99)
HCT: 44 % (ref 36.0–46.0)
Hemoglobin: 15 g/dL (ref 12.0–15.0)
POTASSIUM: 3.5 mmol/L (ref 3.5–5.1)
Sodium: 141 mmol/L (ref 135–145)
TCO2: 20 mmol/L (ref 0–100)

## 2014-09-14 LAB — INFLUENZA PANEL BY PCR (TYPE A & B)
H1N1FLUPCR: NOT DETECTED
INFLAPCR: NEGATIVE
Influenza B By PCR: NEGATIVE

## 2014-09-14 MED ORDER — ALBUTEROL (5 MG/ML) CONTINUOUS INHALATION SOLN
10.0000 mg/h | INHALATION_SOLUTION | RESPIRATORY_TRACT | Status: DC
  Administered 2014-09-14: 10 mg/h via RESPIRATORY_TRACT
  Filled 2014-09-14: qty 20

## 2014-09-14 MED ORDER — ALBUTEROL SULFATE (2.5 MG/3ML) 0.083% IN NEBU: 2.5000 mg | INHALATION_SOLUTION | RESPIRATORY_TRACT | Status: DC | PRN

## 2014-09-14 MED ORDER — MAGNESIUM SULFATE 2 GM/50ML IV SOLN
2.0000 g | Freq: Once | INTRAVENOUS | Status: AC
  Administered 2014-09-14: 2 g via INTRAVENOUS
  Filled 2014-09-14: qty 50

## 2014-09-14 MED ORDER — IPRATROPIUM BROMIDE 0.02 % IN SOLN
1.0000 mg | Freq: Once | RESPIRATORY_TRACT | Status: AC
  Administered 2014-09-14: 1 mg via RESPIRATORY_TRACT
  Filled 2014-09-14: qty 5

## 2014-09-14 MED ORDER — AZITHROMYCIN 250 MG PO TABS: 250.0000 mg | ORAL_TABLET | Freq: Every day | ORAL | Status: DC

## 2014-09-14 MED ORDER — ALBUTEROL SULFATE HFA 108 (90 BASE) MCG/ACT IN AERS
2.0000 | INHALATION_SPRAY | Freq: Once | RESPIRATORY_TRACT | Status: AC
  Administered 2014-09-14: 2 via RESPIRATORY_TRACT
  Filled 2014-09-14: qty 6.7

## 2014-09-14 MED ORDER — PREDNISONE 20 MG PO TABS
60.0000 mg | ORAL_TABLET | Freq: Once | ORAL | Status: AC
  Administered 2014-09-14: 60 mg via ORAL
  Filled 2014-09-14: qty 3

## 2014-09-14 MED ORDER — PREDNISONE 50 MG PO TABS: ORAL_TABLET | ORAL | Status: DC

## 2014-09-14 NOTE — ED Notes (Signed)
EDPA NICHOLE at bedside. 

## 2014-09-14 NOTE — ED Notes (Signed)
RRT PRESENT TO EDUCATION PT ON INHALER

## 2014-09-14 NOTE — Discharge Instructions (Signed)
Please follow with your primary care doctor in the next 2 days for a check-up. They must obtain records for further management.  ° °Do not hesitate to return to the Emergency Department for any new, worsening or concerning symptoms.  ° °

## 2014-09-14 NOTE — ED Provider Notes (Signed)
CSN: [id number]     Arrival date & time 09/14/14  0602 History   None    Chief Complaint  Patient presents with  . Shortness of Breath     (Consider location/radiation/quality/duration/timing/severity/associated sxs/prior Treatment) HPI   Wendy James is a 44 y.o. female with past medical history significant for asthma (has been hospitalized but never intubated) complaining of exacerbation with chest tightness and wheezing and shortness of breath onset 1 week ago significantly worsening this morning. Patient was seen several days ago and given a prednisone burst however she only completed 2 days, she self DC'd because she states it was not helpful. Patient states that she is out of her albuterol nebulizer solution, states that she was given a prescription for Proventil but thinks it is not as effective as other albuterol inhalers . She reports productive cough with tactile fever onset several days ago. She denies any chest pain that is atypical for her asthma exacerbations, states that she does have a feeling of tightness. She denies diaphoresis, nausea vomiting.  Past Medical History  Diagnosis Date  . Asthma   . Seasonal allergies   . Arthritis    Past Surgical History  Procedure Laterality Date  . Cholecystectomy    . Cesarean section    . Tubal ligation     Family History  Problem Relation Age of Onset  . Hypertension Mother   . Diabetes Mother   . Cancer Mother    History  Substance Use Topics  . Smoking status: Former Smoker    Quit date: 11/17/1988  . Smokeless tobacco: Never Used  . Alcohol Use: No   OB History    Gravida Para Term Preterm AB TAB SAB Ectopic Multiple Living   0 1 0 1 0 0 3     Review of Systems  10 systems reviewed and found to be negative, except as noted in the HPI.   Allergies  Shrimp  Home Medications   Prior to Admission medications   Medication Sig Start Date End Date Taking? Authorizing Provider  albuterol (PROVENTIL  HFA;VENTOLIN HFA) 108 (90 BASE) MCG/ACT inhaler Inhale 2 puffs into the lungs every 4 (four) hours as needed for wheezing or shortness of breath. 09/09/14   Roxy Horseman, PA-C  diclofenac sodium (VOLTAREN) 1 % GEL Apply 4 g topically 4 (four) times daily. To shoulder --please instruct in dosage. Patient not taking: Reported on 09/09/2014 08/10/14   Linna Hoff, MD  HYDROcodone-acetaminophen (NORCO/VICODIN) 5-325 MG per tablet Take 1-2 tablets by mouth every 4 (four) hours as needed. Patient not taking: Reported on 08/01/2014 05/19/14   Melvenia Beam A Upstill, PA-C  metroNIDAZOLE (METROGEL VAGINAL) 0.75 % vaginal gel Place 1 Applicatorful vaginally 2 (two) times daily. Patient not taking: Reported on 08/01/2014 05/19/14   Melvenia Beam A Upstill, PA-C  naproxen (NAPROSYN) 500 MG tablet Take 1 tablet (500 mg total) by mouth 2 (two) times daily. Patient not taking: Reported on 09/09/2014 08/01/14   Earle Gell Cartner, PA-C  predniSONE (DELTASONE) 20 MG tablet Take 2 tablets (40 mg total) by mouth daily. 09/09/14   Roxy Horseman, PA-C   BP 141/89 mmHg  Pulse 71  Temp(Src) 98.6 F (37 C) (Oral)  Resp 20  Ht  (1.549 m)  Wt 207 lb (93.895 kg)  BMI 39.13 kg/m2  SpO2 100%  LMP 08/18/2014 Physical Exam  Constitutional: She is oriented to person, place, and time. She appears well-developed and well-nourished. No distress.  HENT:  Head:  Normocephalic and atraumatic.  Mouth/Throat: Oropharynx is clear and moist.  Eyes: Conjunctivae and EOM are normal. Pupils are equal, round, and reactive to light.  Neck: Normal range of motion. Neck supple.  Cardiovascular: Normal rate, regular rhythm and intact distal pulses.   Pulmonary/Chest: Effort normal. No stridor. She has wheezes.  Patient reclining, speaking in complete sentences no accessory muscle use. Poor air movement in all fields, and expiratory wheezing that is moderate  Abdominal: Soft.  Musculoskeletal: Normal range of motion. She exhibits no edema or  tenderness.  No calf asymmetry, superficial collaterals, palpable cords, edema, Homans sign negative bilaterally.    Neurological: She is alert and oriented to person, place, and time.  Psychiatric: She has a normal mood and affect.  Nursing note and vitals reviewed.   ED Course  Procedures (including critical care time) Labs Review Labs Reviewed  CBC WITH DIFFERENTIAL/PLATELET - Abnormal; Notable for the following:    Hemoglobin 10.9 (*)    HCT 34.5 (*)    RDW 15.7 (*)    All other components within normal limits  BLOOD GAS, VENOUS - Abnormal; Notable for the following:    pH, Ven 7.419 (*)    pCO2, Ven 38.7 (*)    pO2, Ven 60.5 (*)    Bicarbonate 24.6 (*)    All other components within normal limits  I-STAT CHEM 8, ED    Imaging Review Dg Chest 2 View  09/14/2014   CLINICAL DATA:  Cough congestion and dyspnea.  EXAM: CHEST  2 VIEW  COMPARISON:  09/09/2014  FINDINGS: The minimal linear right base opacities have resolved since 09/09/2014. The lungs are clear. There are no effusions. Hilar, mediastinal and cardiac contours are unremarkable and unchanged.  IMPRESSION: No active cardiopulmonary disease.   Electronically Signed   By: Ellery Plunk M.D.   On: 09/14/2014 06:55     EKG Interpretation   Date/Time:  Tuesday September 14 2014 06:11:23 EST Ventricular Rate:  80 PR Interval:  149 QRS Duration: 75 QT Interval:  369 QTC Calculation: 426 R Axis:   -41 Text Interpretation:  Sinus rhythm Baseline wander in lead(s) I II aVR aVF  V1 V5 Confirmed by Erroll Luna [phone]) on 09/14/2014 6:36:52 AM      MDM   Final diagnoses:  Asthma exacerbation    Filed Vitals:   09/14/14 0654 09/14/14 0700 09/14/14 0815 09/14/14 0837  BP: 141/89 141/76 138/83   Pulse: 71 74 77   Temp:      TempSrc:      Resp: Height:      Weight:      SpO2: 100% 100% 100% 99%    Medications  albuterol (PROVENTIL,VENTOLIN) solution continuous neb (0 mg/hr Nebulization  Stopped 09/14/14 0831)  predniSONE (DELTASONE) tablet 60 mg (60 mg Oral Given 09/14/14 0832)  ipratropium (ATROVENT) nebulizer solution 1 mg (1 mg Nebulization Given 09/14/14 1914)  magnesium sulfate IVPB 2 g 50 mL (0 g Intravenous Stopped 09/14/14 0831)  albuterol (PROVENTIL HFA;VENTOLIN HFA) 108 (90 BASE) MCG/ACT inhaler 2 puff (2 puffs Inhalation Given 09/14/14 0835)    Wendy James is a pleasant 44 y.o. female presenting with asthma exacerbation, no acute respiratory distress. After continuous nebulizer patient's lung sounds are significantly improved with excellent aeration in all fields and only slight, scattered, expiratory wheezing. EKG is without acute changes, I doubt this is ACS, patient is also low risk for DVT or PE. Patient was seen last week and given a  prescription for prednisone burst however she did not complete the course. She did not think it was helpful. She is also out of her albuterol nebulizer at home. Patient reports a productive cough however, chest x-rays without infiltrate. Considering her diagnosis of COPD and change in sputum I'll treat her with Z-Pak. Splint to the patient the port importance of taking her steroids, she promised to continue this course. She does not have a primary care physician but has [facility], I've encouraged to follow closely with them and we have had an extensive discussion of return precautions.  Evaluation does not show pathology that would require ongoing emergent intervention or inpatient treatment. Pt is hemodynamically stable and mentating appropriately. Discussed findings and plan with patient/guardian, who agrees with care plan. All questions answered. Return precautions discussed and outpatient follow up given.   New Prescriptions   ALBUTEROL (PROVENTIL) (2.5 MG/3ML) 0.083% NEBULIZER SOLUTION    Take 3 mLs (2.5 mg total) by nebulization every 4 (four) hours as needed for wheezing or shortness of breath.   AZITHROMYCIN  (ZITHROMAX Z-PAK) 250 MG TABLET    Take 1 tablet (250 mg total) by mouth daily. 500mg  PO day 1, then 250mg  PO days 205   PREDNISONE (DELTASONE) 50 MG TABLET    Take 1 tablet daily with breakfast         Wynetta Emeryicole Semaja Lymon, PA-C 09/14/14 [id number]  Mirian MoMatthew Gentry, MD 09/17/14 1620

## 2014-09-14 NOTE — ED Notes (Signed)
Respiratory at bedside.

## 2014-09-14 NOTE — ED Notes (Signed)
Patient states she has had short of breath for the last week. Tonight, it got worse again. Seen at [facility] on last Tuesday for same symptoms. Dx asthma. Pt has a productive cough.

## 2014-09-14 NOTE — ED Notes (Signed)
Warm tea given. Waiting on husband for a ride.

## 2014-11-02 ENCOUNTER — Encounter (HOSPITAL_COMMUNITY): Payer: Self-pay | Admitting: Emergency Medicine

## 2014-11-02 ENCOUNTER — Emergency Department (HOSPITAL_COMMUNITY)
Admission: EM | Admit: 2014-11-02 | Discharge: 2014-11-02 | Disposition: A | Discharge status: Home / Self Care | Payer: 59 | Attending: Emergency Medicine | Admitting: Emergency Medicine

## 2014-11-02 DIAGNOSIS — Z7952 Long term (current) use of systemic steroids: Secondary | ICD-10-CM | POA: Insufficient documentation

## 2014-11-02 DIAGNOSIS — M1990 Unspecified osteoarthritis, unspecified site: Secondary | ICD-10-CM | POA: Diagnosis not present

## 2014-11-02 DIAGNOSIS — R079 Chest pain, unspecified: Secondary | ICD-10-CM | POA: Diagnosis present

## 2014-11-02 DIAGNOSIS — Z79899 Other long term (current) drug therapy: Secondary | ICD-10-CM | POA: Insufficient documentation

## 2014-11-02 DIAGNOSIS — J45909 Unspecified asthma, uncomplicated: Secondary | ICD-10-CM | POA: Diagnosis not present

## 2014-11-02 LAB — CBC
HEMATOCRIT: 35.6 % — AB (ref 36.0–46.0)
HEMOGLOBIN: 11.4 g/dL — AB (ref 12.0–15.0)
MCH: 27.9 pg (ref 26.0–34.0)
MCHC: 32 g/dL (ref 30.0–36.0)
MCV: 87 fL (ref 78.0–100.0)
Platelets: 221 10*3/uL (ref 150–400)
RBC: 4.09 MIL/uL (ref 3.87–5.11)
RDW: 15.1 % (ref 11.5–15.5)
WBC: 4.9 10*3/uL (ref 4.0–10.5)

## 2014-11-02 LAB — I-STAT TROPONIN, ED
TROPONIN I, POC: 0 ng/mL (ref 0.00–0.08)
Troponin i, poc: 0 ng/mL (ref 0.00–0.08)

## 2014-11-02 LAB — BASIC METABOLIC PANEL
ANION GAP: 9 (ref 5–15)
BUN: 10 mg/dL (ref 6–23)
CHLORIDE: 104 mmol/L (ref 96–112)
CO2: 25 mmol/L (ref 19–32)
Calcium: 8.6 mg/dL (ref 8.4–10.5)
Creatinine, Ser: 0.69 mg/dL (ref 0.50–1.10)
GFR calc Af Amer: >90 mL/min (ref >90)
GFR calc non Af Amer: >90 mL/min (ref >90)
Glucose, Bld: 95 mg/dL (ref 70–99)
Potassium: 3.8 mmol/L (ref 3.5–5.1)
SODIUM: 138 mmol/L (ref 135–145)

## 2014-11-02 MED ORDER — ASPIRIN 81 MG PO CHEW
324.0000 mg | CHEWABLE_TABLET | Freq: Once | ORAL | Status: AC
  Administered 2014-11-02: 324 mg via ORAL
  Filled 2014-11-02: qty 4

## 2014-11-02 NOTE — Progress Notes (Signed)
  [facility] ED CM spoke with pt on how to obtain an in network pcp with insurance coverage via the customer service number or web site  Cm reviewed ED level of care for crisis/emergent services and community pcp level of care to manage continuous or chronic medical concerns.  The pt voiced understanding CM encouraged pt and discussed pt's responsibility to verify with pt's insurance carrier that any recommended medical provider offered by any emergency room or a hospital provider is within the carrier's network. The pt voiced understanding   

## 2014-11-02 NOTE — Discharge Instructions (Signed)
If you were given medicines take as directed.  If you are on coumadin or contraceptives realize their levels and effectiveness is altered by many different medicines.  If you have any reaction (rash, tongues swelling, other) to the medicines stop taking and see a physician.   Please follow up as directed and return to the ER or see a physician for new or worsening symptoms.  Thank you. Filed Vitals:   11/02/14 1409  BP: 122/69  Pulse: 63  Resp: 18  SpO2: 97%    Chest Pain (Nonspecific) It is often hard to give a specific diagnosis for the cause of chest pain. There is always a chance that your pain could be related to something serious, such as a heart attack or a blood clot in the lungs. You need to follow up with your health care provider for further evaluation. CAUSES   Heartburn.  Pneumonia or bronchitis.  Anxiety or stress.  Inflammation around your heart (pericarditis) or lung (pleuritis or pleurisy).  A blood clot in the lung.  A collapsed lung (pneumothorax). It can develop suddenly on its own (spontaneous pneumothorax) or from trauma to the chest.  Shingles infection (herpes zoster virus). The chest wall is composed of bones, muscles, and cartilage. Any of these can be the source of the pain.  The bones can be bruised by injury.  The muscles or cartilage can be strained by coughing or overwork.  The cartilage can be affected by inflammation and become sore (costochondritis). DIAGNOSIS  Lab tests or other studies may be needed to find the cause of your pain. Your health care provider may have you take a test called an ambulatory electrocardiogram (ECG). An ECG records your heartbeat patterns over a 24-hour period. You may also have other tests, such as:  Transthoracic echocardiogram (TTE). During echocardiography, sound waves are used to evaluate how blood flows through your heart.  Transesophageal echocardiogram (TEE).  Cardiac monitoring. This allows your health  care provider to monitor your heart rate and rhythm in real time.  Holter monitor. This is a portable device that records your heartbeat and can help diagnose heart arrhythmias. It allows your health care provider to track your heart activity for several days, if needed.  Stress tests by exercise or by giving medicine that makes the heart beat faster. TREATMENT   Treatment depends on what may be causing your chest pain. Treatment may include:  Acid blockers for heartburn.  Anti-inflammatory medicine.  Pain medicine for inflammatory conditions.  Antibiotics if an infection is present.  You may be advised to change lifestyle habits. This includes stopping smoking and avoiding alcohol, caffeine, and chocolate.  You may be advised to keep your head raised (elevated) when sleeping. This reduces the chance of acid going backward from your stomach into your esophagus. Most of the time, nonspecific chest pain will improve within 2-3 days with rest and mild pain medicine.  HOME CARE INSTRUCTIONS   If antibiotics were prescribed, take them as directed. Finish them even if you start to feel better.  For the next few days, avoid physical activities that bring on chest pain. Continue physical activities as directed.  Do not use any tobacco products, including cigarettes, chewing tobacco, or electronic cigarettes.  Avoid drinking alcohol.  Only take medicine as directed by your health care provider.  Follow your health care provider's suggestions for further testing if your chest pain does not go away.  Keep any follow-up appointments you made. If you do not go to  an appointment, you could develop lasting (chronic) problems with pain. If there is any problem keeping an appointment, call to reschedule. SEEK MEDICAL CARE IF:   Your chest pain does not go away, even after treatment.  You have a rash with blisters on your chest.  You have a fever. SEEK IMMEDIATE MEDICAL CARE IF:   You have  increased chest pain or pain that spreads to your arm, neck, jaw, back, or abdomen.  You have shortness of breath.  You have an increasing cough, or you cough up blood.  You have severe back or abdominal pain.  You feel nauseous or vomit.  You have severe weakness.  You faint.  You have chills. This is an emergency. Do not wait to see if the pain will go away. Get medical help at once. Call your local emergency services (911 in [place] Do not drive yourself to the hospital. MAKE SURE YOU:   Understand these instructions.  Will watch your condition.  Will get help right away if you are not doing well or get worse. Document Released: 04/25/2005 Document Revised: 07/21/2013 Document Reviewed: 02/19/2008 [facility] Patient Information 2015 [place], [name]. This information is not intended to replace advice given to you by your health care provider. Make sure you discuss any questions you have with your health care provider.

## 2014-11-02 NOTE — ED Provider Notes (Signed)
CSN: [id number]     Arrival date & time 11/02/14  1145 History   First MD Initiated Contact with Patient 11/02/14 1158     Chief Complaint  Patient presents with  . Chest Pain     (Consider location/radiation/quality/duration/timing/severity/associated sxs/prior Treatment) HPI Comments: 44 year old female with history of asthma, obesity, elevated blood pressure presents with sharp chest pain mild radiation down the right arm constant since 1045 today while at work. No history of cardiac problems no diaphoresis or consistent exertional symptoms.  Patient denies blood clot history, active cancer, recent major trauma or surgery, unilateral leg swelling/ pain, recent long travel, hemoptysis or oral contraceptives. Nothing specifically worsens or improves.   Patient is a 44 y.o. female presenting with chest pain. The history is provided by the patient.  Chest Pain Associated symptoms: no abdominal pain, no back pain, no cough, no fever, no headache, no shortness of breath and not vomiting     Past Medical History  Diagnosis Date  . Asthma   . Seasonal allergies   . Arthritis    Past Surgical History  Procedure Laterality Date  . Cholecystectomy    . Cesarean section    . Tubal ligation     Family History  Problem Relation Age of Onset  . Hypertension Mother   . Stroke Sister   . Seizures Sister    History  Substance Use Topics  . Smoking status: Former Smoker    Quit date: 11/17/1988  . Smokeless tobacco: Never Used  . Alcohol Use: No   OB History    Gravida Para Term Preterm AB TAB SAB Ectopic Multiple Living   0 1 0 1 0 0 3     Review of Systems  Constitutional: Negative for fever and chills.  HENT: Negative for congestion.   Eyes: Negative for visual disturbance.  Respiratory: Negative for cough and shortness of breath.   Cardiovascular: Positive for chest pain. Negative for leg swelling.  Gastrointestinal: Negative for vomiting and abdominal pain.   Genitourinary: Negative for dysuria and flank pain.  Musculoskeletal: Negative for back pain, neck pain and neck stiffness.  Skin: Negative for rash.  Neurological: Negative for light-headedness and headaches.      Allergies  Shrimp  Home Medications   Prior to Admission medications   Medication Sig Start Date End Date Taking? Authorizing Provider  albuterol (PROVENTIL HFA;VENTOLIN HFA) 108 (90 BASE) MCG/ACT inhaler Inhale 2 puffs into the lungs every 6 (six) hours as needed for wheezing or shortness of breath.   Yes Historical Provider, MD  albuterol (PROVENTIL) (2.5 MG/3ML) 0.083% nebulizer solution Take 3 mLs (2.5 mg total) by nebulization every 4 (four) hours as needed for wheezing or shortness of breath. 09/14/14  Yes Nicole Pisciotta, PA-C  cyclobenzaprine (FLEXERIL) 5 MG tablet Take 5 mg by mouth 3 (three) times daily as needed for muscle spasms.   Yes Historical Provider, MD  ibuprofen (ADVIL,MOTRIN) 800 MG tablet Take 800 mg every 8 hours with food for 5 days 11/04/14   Leone Brand, NP  predniSONE (DELTASONE) 50 MG tablet Take 1 tablet daily with breakfast 09/14/14   Joni Reining Pisciotta, PA-C  traMADol (ULTRAM) 50 MG tablet Take 50 mg by mouth every 6 (six) hours as needed (for pain).    Historical Provider, MD   BP 121/60 mmHg  Pulse 60  Resp 17  SpO2 98% Physical Exam  Constitutional: She is oriented to person, place, and time. She appears well-developed and well-nourished.  HENT:  Head: Normocephalic and atraumatic.  Eyes: Conjunctivae are normal. Right eye exhibits no discharge. Left eye exhibits no discharge.  Neck: Normal range of motion. Neck supple. No tracheal deviation present.  Cardiovascular: Normal rate, regular rhythm and intact distal pulses.   Pulmonary/Chest: Effort normal and breath sounds normal.  Abdominal: Soft. She exhibits no distension. There is no tenderness. There is no guarding.  Musculoskeletal: She exhibits no edema.  Neurological: She is alert  and oriented to person, place, and time.  Skin: Skin is warm. No rash noted.  Psychiatric: She has a normal mood and affect.  Nursing note and vitals reviewed.   ED Course  Procedures (including critical care time) Labs Review Labs Reviewed  CBC - Abnormal; Notable for the following:    Hemoglobin 11.4 (*)    HCT 35.6 (*)    All other components within normal limits  BASIC METABOLIC PANEL  I-STAT TROPOININ, ED  I-STAT TROPOININ, ED    Imaging Review No results found.   EKG Interpretation   Date/Time:  Tuesday November 02 2014 12:00:03 EDT Ventricular Rate:  67 PR Interval:  159 QRS Duration: 76 QT Interval:  396 QTC Calculation: 418 R Axis:   -57 Text Interpretation:  Sinus rhythm Left anterior fascicular block  Confirmed by Pegge Cumberledge  MD, Laurissa Cowper ([id number]) on 11/02/2014 12:47:56 PM      MDM   Final diagnoses:  Chest pain, unspecified chest pain type    Patient presents with sharp chest pain down the right arm, no classic blood clot risk factors, low risk cardiac. Plan for pain meds, patient had aspirin and delta troponin.  Patient improved in the ER, delta troponin negative. Discussed close outpatient follow-up. Results and differential diagnosis were discussed with the patient/parent/guardian. Close follow up outpatient was discussed, comfortable with the plan.   Medications  aspirin chewable tablet 324 mg (324 mg Oral Given 11/02/14 1234)    Filed Vitals:   11/02/14 1409 11/02/14 1456  BP: 122/69 121/60  Pulse: 63 60  Resp: 18 17  SpO2: 97% 98%    Final diagnoses:  Chest pain, unspecified chest pain type       Blane OharaJoshua Bently Morath, MD 11/06/14 [phone]

## 2014-11-02 NOTE — ED Notes (Signed)
Bed: [id number] Expected date:  Expected time:  Means of arrival:  Comments: Ems- chest pain

## 2014-11-02 NOTE — ED Notes (Signed)
Per EMS: pt c/o right shoulder/chest pain, states pain shoot downs arm. Denies n/v/, lightheadedness, SOB. Pt hypertensive and does not take any meds for that.

## 2014-11-04 ENCOUNTER — Ambulatory Visit (INDEPENDENT_AMBULATORY_CARE_PROVIDER_SITE_OTHER): Payer: 59 | Admitting: Cardiovascular Disease

## 2014-11-04 ENCOUNTER — Telehealth: Payer: Self-pay | Admitting: *Deleted

## 2014-11-04 ENCOUNTER — Encounter: Payer: Self-pay | Admitting: Cardiovascular Disease

## 2014-11-04 VITALS — BP 148/90 | HR 66 | Ht 61.0 in | Wt 211.8 lb

## 2014-11-04 DIAGNOSIS — R072 Precordial pain: Secondary | ICD-10-CM | POA: Diagnosis not present

## 2014-11-04 DIAGNOSIS — M940 Chondrocostal junction syndrome [Tietze]: Secondary | ICD-10-CM

## 2014-11-04 MED ORDER — IBUPROFEN 800 MG PO TABS
ORAL_TABLET | ORAL | Status: DC
Start: 2014-11-04 — End: 2015-05-03

## 2014-11-04 NOTE — Telephone Encounter (Signed)
Patient walked in with c/o sharp chest pain that intermittent over the past two days. Was seen in ED 4/5 - Troponin -negative, BP elevated, labs ok. Patient reports intermittent dizziness, SOB with exertion, right arm numbness/tingling intermittently. Patient states in ED they gave her ASA, EKG, labs and informed her to be seen by [facility] today. She has no appointment. Current BP 164/99, irregular 78 HR. States CP subsiding over past 10 minutes but that it comes and goes hourly. Denies radiating pain elsewhere. Reviewed by Dr. Excell Seltzerooper and Nada BoozerLaura Ingold, NP. Scheduled to be seen today at 3:00 pm but L. Ingold/Dr. Excell Seltzerooper.

## 2014-11-04 NOTE — Patient Instructions (Addendum)
Take ibuprofen 800 mg every 8 hours for 5 days with food for you may also take a pepcid 20 mg  Daily over the counter to prevent GI issues with the ibuprofen  Costochondritis inflammation between the tissue of the ribs.  You will follow up after the stress echo..Marland Kitchen

## 2014-11-04 NOTE — Progress Notes (Addendum)
Cardiology Office Note   Date:  11/04/2014   ID:  Wendy James, DOB 10/09/1970, MRN [id number]  PCP:  No PCP Per Patient  Cardiologist:  New Dr. Excell Seltzerooper    Chief Complaint  Patient presents with  . Chest Pain    seen in ER       History of Present Illness: Wendy James is a 44 y.o. female who presents for chest pain and follow up from the ED.  She has a history of asthma, obesity, elevated blood pressure presents with sharp chest pain rated 10/10 with radiation down the right arm.  First episode on Tuesday while at work.  No nausea, diaphoresis, or SOB.  No increase pain with deep breath.  No racing HR, no palpitations.  No history of cardiac problems previously.   No exertional symptoms. Patient denies blood clot history, active cancer, recent major trauma or surgery, unilateral leg swelling/ pain, recent long travel, hemoptysis or oral contraceptives. Nothing specifically worsens or improves the pain.  It comes on suddenly and gradually resolves.  She had another episode this AM and came to office with pain resolved by the time she came up the elevator.    In ER troponins negative and CXR stable.  EKG stable.         Past Medical History  Diagnosis Date  . Asthma   . Seasonal allergies   . Arthritis     Past Surgical History  Procedure Laterality Date  . Cholecystectomy    . Cesarean section    . Tubal ligation       Current Outpatient Prescriptions  Medication Sig Dispense Refill  . albuterol (PROVENTIL HFA;VENTOLIN HFA) 108 (90 BASE) MCG/ACT inhaler Inhale 2 puffs into the lungs every 6 (six) hours as needed for wheezing or shortness of breath.    Marland Kitchen. albuterol (PROVENTIL) (2.5 MG/3ML) 0.083% nebulizer solution Take 3 mLs (2.5 mg total) by nebulization every 4 (four) hours as needed for wheezing or shortness of breath. 30 vial 0  . cyclobenzaprine (FLEXERIL) 5 MG tablet Take 5 mg by mouth 3 (three) times daily as needed for muscle spasms.    . predniSONE  (DELTASONE) 50 MG tablet Take 1 tablet daily with breakfast 5 tablet 0  . traMADol (ULTRAM) 50 MG tablet Take 50 mg by mouth every 6 (six) hours as needed (for pain).     No current facility-administered medications for this visit.    Allergies:   Shrimp    Social History:  The patient  reports that she quit smoking about 25 years ago. She has never used smokeless tobacco. She reports that she does not drink alcohol or use illicit drugs.   Family History:  The patient's family history includes Hypertension in her mother; Seizures in her sister; Stroke in her sister. father's hx unknown   ROS:  General:no colds or fevers, no weight changes Skin:no rashes or ulcers HEENT:no blurred vision, no congestion CV:see HPI PUL:see HPI GI:n mild diarrhea this am, no constipation or melena, no indigestion GU:no hematuria, no dysuria MS:no joint pain, no claudication Neuro:no syncope, no lightheadedness Endo:no diabetes, no thyroid disease GYN: has had tubal ligation, denies pregnancy, + uterine fibroids and for ultrasound this month may need hysterectomy.    Wt Readings from Last 3 Encounters:  11/04/14 211 lb 12.8 oz (96.072 kg)  09/14/14 207 lb (93.895 kg)  11/17/13 207 lb 6.4 oz (94.076 kg)     PHYSICAL EXAM: VS:  BP 148/90 mmHg  Pulse  66  Ht  (1.549 m)  Wt 211 lb 12.8 oz (96.072 kg)  BMI 40.04 kg/m2 , BMI Body mass index is 40.04 kg/(m^2). General:Pleasant affect, NAD Skin:Warm and dry, brisk capillary refill HEENT:normocephalic, sclera clear, mucus membranes moist Neck:supple, no JVD, no bruits  Heart:S1S2 RRR without murmur, gallup, rub or click, + chest wall pain to palpation  Lungs:clear without rales, rhonchi, or wheezes ZOX:WRUE, non tender, + BS, do not palpate liver spleen or masses Ext:no lower ext edema, 2+ pedal pulses, 2+ radial pulses Neuro:alert and oriented X 3, MAE, follows commands, + facial symmetry    EKG:  EKG is ordered today. The ekg ordered today  demonstrates SR no acute changes.     Recent Labs: 11/02/2014: BUN 10; Creatinine 0.69; Hemoglobin 11.4*; Platelets 221; Potassium 3.8; Sodium 138    Lipid Panel    Component Value Date/Time   CHOL 186 01/05/2013 1737   TRIG 99 01/05/2013 1737   HDL 67 01/05/2013 1737   CHOLHDL 2.8 01/05/2013 1737   VLDL 20 01/05/2013 1737   LDLCALC 99 01/05/2013 1737       Other studies Reviewed: Additional studies/ records that were reviewed today include:  ER notes and labs, CXR   ASSESSMENT AND PLAN:  1.  Chest pain, most likely chest wall -costochondritis. Ibuprofen 800 mg every 8 hours for 5 days then only if needed.  May use pepcid 20 mg daily to protect your stomach as well.  This should improve with the ibuprofen.  We will also check stress echo to insure no ischemia.  She may need hysterectomy in near future.  Risk factors with unknown health hx of father.  She will follow up after stress echo.  Will keep out of work until Monday April 11th.   Dr. Excell Seltzer saw and examined her as well.  We reassured that most likely chest wall pain.     2. Asthma, no recent excerebration.    Current medicines are reviewed with the patient today.  The patient Has no concerns regarding medicines.  The following changes have been made:  See above Labs/ tests ordered today include:see above  Disposition:   FU:  see above  Signed, Leone Brand, NP  11/04/2014 3:26 PM    [facility] [address] [place], [place], [place]  [address]/ [address] [place], [place] Phone: [phone]; Fax: [phone]  [phone]  ADDENDUM: Patient seen, examined. Available data reviewed. Agree with findings, assessment, and plan as outlined by Nada Boozer, NP. Exam reveals clear lungs and heart RRR. The chest wall is tender especially over the right parasternal area. Agree a stress echo is indicated to evaluate her chest pain, but suspect costochondritis. Plan as above.  Tonny Bollman, M.D. 11/04/2014 9:48 PM

## 2014-11-05 ENCOUNTER — Encounter: Payer: Self-pay | Admitting: Cardiovascular Disease

## 2014-11-09 ENCOUNTER — Other Ambulatory Visit (HOSPITAL_COMMUNITY): Payer: 59

## 2014-11-11 ENCOUNTER — Other Ambulatory Visit (HOSPITAL_COMMUNITY): Payer: 59

## 2014-11-11 ENCOUNTER — Ambulatory Visit (HOSPITAL_COMMUNITY): Payer: 59

## 2014-11-16 ENCOUNTER — Ambulatory Visit (HOSPITAL_COMMUNITY): Payer: 59 | Attending: Cardiology

## 2014-11-16 DIAGNOSIS — R072 Precordial pain: Secondary | ICD-10-CM | POA: Insufficient documentation

## 2014-11-16 DIAGNOSIS — E669 Obesity, unspecified: Secondary | ICD-10-CM | POA: Diagnosis not present

## 2014-11-16 DIAGNOSIS — Z6841 Body Mass Index (BMI) 40.0 and over, adult: Secondary | ICD-10-CM | POA: Insufficient documentation

## 2014-11-16 NOTE — Progress Notes (Signed)
Stress echo performed. 

## 2014-11-17 ENCOUNTER — Encounter: Payer: Self-pay | Admitting: Obstetrics & Gynecology

## 2014-11-17 ENCOUNTER — Ambulatory Visit (INDEPENDENT_AMBULATORY_CARE_PROVIDER_SITE_OTHER): Payer: 59 | Admitting: Obstetrics & Gynecology

## 2014-11-17 VITALS — BP 169/105 | HR 79 | Temp 98.6°F | Ht 61.0 in | Wt 207.3 lb

## 2014-11-17 DIAGNOSIS — Z0000 Encounter for general adult medical examination without abnormal findings: Secondary | ICD-10-CM

## 2014-11-17 DIAGNOSIS — Z1151 Encounter for screening for human papillomavirus (HPV): Secondary | ICD-10-CM

## 2014-11-17 DIAGNOSIS — Z124 Encounter for screening for malignant neoplasm of cervix: Secondary | ICD-10-CM

## 2014-11-17 DIAGNOSIS — N920 Excessive and frequent menstruation with regular cycle: Secondary | ICD-10-CM

## 2014-11-17 LAB — TSH: TSH: 0.667 u[IU]/mL (ref 0.350–4.500)

## 2014-11-17 MED ORDER — MISOPROSTOL 200 MCG PO TABS: ORAL_TABLET | ORAL | Status: DC

## 2014-11-17 NOTE — Progress Notes (Signed)
Patient has [employer] for insurance-- [facility] numbers to [facility] to call and schedule a new patient appointment. Patient left before mammogram and U/S scheduled due to wait time. Will call patient with appointments-- she states its OK to leave message.   1517: U/S and mammogram scheduled for 11/23/14 at 1600 for U/S and 1630 for mammogram. Called patient and left message over voicemail informing her of appointment date, time and location and to call clinic with questions.

## 2014-11-17 NOTE — Progress Notes (Signed)
Subjective:    Wendy James is a 44 y.o. MAA P4 female who presents for an annual exam. She reports very heavy periods. A hemoglobin last month was 11.4. The patient is sexually active. GYN screening history: last pap: was normal. The patient wears seatbelts: yes. The patient participates in regular exercise: no. Has the patient ever been transfused or tattooed?: no. The patient reports that there is not domestic violence in her life.   Menstrual History: OB History    Gravida Para Term Preterm AB TAB SAB Ectopic Multiple Living   5 4 4  0 1 0 1 0 0 3      Menarche age: [age]  Patient's last menstrual period was 11/10/2014.    The following portions of the patient's history were reviewed and updated as appropriate: allergies, current medications, past family history, past medical history, past social history, past surgical history and problem list.  Review of Systems Pertinent items are noted in HPI. She has been married for 3 years and denies dyspareunia. She is due for mammogram and pap. All her children were born via cesarean section.   Objective:    BP 169/105 mmHg  Pulse 79  Temp(Src) 98.6 F (37 C) (Oral)  Ht 5\' 1"  (1.549 m)  Wt 207 lb 4.8 oz (94.031 kg)  BMI 39.19 kg/m2  LMP 11/10/2014  General Appearance:    Alert, cooperative, no distress, appears stated age  Head:    Normocephalic, without obvious abnormality, atraumatic  Eyes:    PERRL, conjunctiva/corneas clear, EOM's intact, fundi    benign, both eyes  Ears:    Normal TM's and external ear canals, both ears  Nose:   Nares normal, septum midline, mucosa normal, no drainage    or sinus tenderness  Throat:   Lips, mucosa, and tongue normal; teeth and gums normal  Neck:   Supple, symmetrical, trachea midline, no adenopathy;    thyroid:  no enlargement/tenderness/nodules; no carotid   bruit or JVD  Back:     Symmetric, no curvature, ROM normal, no CVA tenderness  Lungs:     Clear to auscultation bilaterally,  respirations unlabored  Chest Wall:    No tenderness or deformity   Heart:    Regular rate and rhythm, S1 and S2 normal, no murmur, rub   or gallop  Breast Exam:    No tenderness, masses, or nipple abnormality  Abdomen:     Soft, non-tender, bowel sounds active all four quadrants,    no masses, no organomegaly, obese  Genitalia:    Normal female without lesion, discharge or tenderness, nulliparous cervix, no decensus, slightly enlarged uterus, no pelvic masses felt     Extremities:   Extremities normal, atraumatic, no cyanosis or edema  Pulses:   2+ and symmetric all extremities  Skin:   Skin color, texture, turgor normal, no rashes or lesions  Lymph nodes:   Cervical, supraclavicular, and axillary nodes normal  Neurologic:   CNII-XII intact, normal strength, sensation and reflexes    throughout  .    Assessment:    Healthy female exam.   Menorrhagia   Plan:     Pap with cotesting Mammogram U/S TSH cytotec [facility]

## 2014-11-18 LAB — CYTOLOGY - PAP

## 2014-11-19 ENCOUNTER — Telehealth: Payer: Self-pay

## 2014-11-19 NOTE — Telephone Encounter (Signed)
Dr Earmon Phoenixooper's interpretation of Stress Echo: This is a normal stress echo and Wendy James does not require any further cardiac evaluation. thx  Wendy James aware of results by phone.

## 2014-11-23 ENCOUNTER — Ambulatory Visit (HOSPITAL_COMMUNITY)
Admission: RE | Admit: 2014-11-23 | Discharge: 2014-11-23 | Disposition: A | Discharge status: Home / Self Care | Payer: 59 | Source: Ambulatory Visit | Attending: Obstetrics & Gynecology | Admitting: Obstetrics & Gynecology

## 2014-11-23 ENCOUNTER — Other Ambulatory Visit: Payer: Self-pay | Admitting: General Practice

## 2014-11-23 DIAGNOSIS — N939 Abnormal uterine and vaginal bleeding, unspecified: Secondary | ICD-10-CM | POA: Diagnosis present

## 2014-11-23 DIAGNOSIS — Z1231 Encounter for screening mammogram for malignant neoplasm of breast: Secondary | ICD-10-CM

## 2014-11-23 DIAGNOSIS — Z7689 Persons encountering health services in other specified circumstances: Secondary | ICD-10-CM

## 2014-11-23 DIAGNOSIS — Z0000 Encounter for general adult medical examination without abnormal findings: Secondary | ICD-10-CM

## 2014-11-23 DIAGNOSIS — Z01818 Encounter for other preprocedural examination: Principal | ICD-10-CM

## 2014-11-23 DIAGNOSIS — N920 Excessive and frequent menstruation with regular cycle: Secondary | ICD-10-CM

## 2014-11-23 MED ORDER — MISOPROSTOL 200 MCG PO TABS: ORAL_TABLET | ORAL | Status: DC

## 2014-11-23 NOTE — Progress Notes (Signed)
Ultrasound called stating patient is upstairs and thought she was supposed to have a biopsy today and has already taken cytotec. Informed them that patient's appt with us is on 5/25 for a biopsy and we will reorder her medication and she is to take it the night before on 5/24. Ultrasound informed patient. Patient had no questions

## 2014-11-24 ENCOUNTER — Other Ambulatory Visit: Payer: Self-pay | Admitting: Family

## 2014-11-24 ENCOUNTER — Telehealth: Payer: Self-pay | Admitting: *Deleted

## 2014-11-24 DIAGNOSIS — A599 Trichomoniasis, unspecified: Secondary | ICD-10-CM

## 2014-11-24 MED ORDER — METRONIDAZOLE 500 MG PO TABS: 2000.0000 mg | ORAL_TABLET | Freq: Once | ORAL | Status: DC

## 2014-11-24 NOTE — Telephone Encounter (Signed)
Trichomonas present on pap.  Contacted patient, results given with instructions for treatment.  Informed patient to have partner/partners treated and to abstain for sexual relations until all have been treated.  Medication ordered.

## 2014-12-07 ENCOUNTER — Emergency Department (HOSPITAL_COMMUNITY)
Admission: EM | Admit: 2014-12-07 | Discharge: 2014-12-07 | Disposition: A | Discharge status: Home / Self Care | Payer: 59 | Attending: Emergency Medicine | Admitting: Emergency Medicine

## 2014-12-07 ENCOUNTER — Emergency Department (HOSPITAL_COMMUNITY): Payer: 59

## 2014-12-07 ENCOUNTER — Encounter (HOSPITAL_COMMUNITY): Payer: Self-pay | Admitting: Emergency Medicine

## 2014-12-07 DIAGNOSIS — Z87891 Personal history of nicotine dependence: Secondary | ICD-10-CM | POA: Insufficient documentation

## 2014-12-07 DIAGNOSIS — R05 Cough: Secondary | ICD-10-CM

## 2014-12-07 DIAGNOSIS — R059 Cough, unspecified: Secondary | ICD-10-CM

## 2014-12-07 DIAGNOSIS — J45901 Unspecified asthma with (acute) exacerbation: Secondary | ICD-10-CM

## 2014-12-07 DIAGNOSIS — J45909 Unspecified asthma, uncomplicated: Secondary | ICD-10-CM | POA: Diagnosis present

## 2014-12-07 DIAGNOSIS — M1990 Unspecified osteoarthritis, unspecified site: Secondary | ICD-10-CM | POA: Insufficient documentation

## 2014-12-07 DIAGNOSIS — Z79899 Other long term (current) drug therapy: Secondary | ICD-10-CM | POA: Diagnosis not present

## 2014-12-07 MED ORDER — PREDNISONE 20 MG PO TABS
60.0000 mg | ORAL_TABLET | Freq: Once | ORAL | Status: AC
  Administered 2014-12-07: 60 mg via ORAL
  Filled 2014-12-07: qty 3

## 2014-12-07 MED ORDER — PREDNISONE 50 MG PO TABS: 50.0000 mg | ORAL_TABLET | Freq: Every day | ORAL | Status: DC

## 2014-12-07 MED ORDER — ALBUTEROL SULFATE (2.5 MG/3ML) 0.083% IN NEBU: 2.5000 mg | INHALATION_SOLUTION | RESPIRATORY_TRACT | Status: DC | PRN

## 2014-12-07 MED ORDER — ALBUTEROL SULFATE HFA 108 (90 BASE) MCG/ACT IN AERS
2.0000 | INHALATION_SPRAY | RESPIRATORY_TRACT | Status: DC | PRN
Start: 2014-12-07 — End: 2014-12-07
  Filled 2014-12-07: qty 6.7

## 2014-12-07 MED ORDER — IPRATROPIUM-ALBUTEROL 0.5-2.5 (3) MG/3ML IN SOLN
3.0000 mL | Freq: Once | RESPIRATORY_TRACT | Status: AC
  Administered 2014-12-07: 3 mL via RESPIRATORY_TRACT
  Filled 2014-12-07: qty 3

## 2014-12-07 NOTE — ED Provider Notes (Signed)
CSN: [id number]     Arrival date & time 12/07/14  0555 History   First MD Initiated Contact with Patient 12/07/14 [phone]     Chief Complaint  Patient presents with  . Asthma     (Consider location/radiation/quality/duration/timing/severity/associated sxs/prior Treatment) Patient is a 44 y.o. female presenting with asthma. The history is provided by the patient.  Asthma  She has been having difficulty with wheezing for the last 9 days with symptoms getting worse and she's been using her home nebulizer which is only giving her slight relief. There is a cough productive of yellowish sputum. She denies fever, chills, sweats. She denies chest pain. There has been some post tussive emesis. She has had to be admitted to the hospital in the past for her asthma but has never been intubated.  Past Medical History  Diagnosis Date  . Asthma   . Seasonal allergies   . Arthritis    Past Surgical History  Procedure Laterality Date  . Cholecystectomy    . Cesarean section    . Tubal ligation     Family History  Problem Relation Age of Onset  . Hypertension Mother   . Stroke Sister   . Seizures Sister    History  Substance Use Topics  . Smoking status: Former Smoker    Quit date: 11/17/1988  . Smokeless tobacco: Never Used  . Alcohol Use: No   OB History    Gravida Para Term Preterm AB TAB SAB Ectopic Multiple Living   5 4 4  0 1 0 1 0 0 3     Review of Systems  All other systems reviewed and are negative.     Allergies  Shrimp  Home Medications   Prior to Admission medications   Medication Sig Start Date End Date Taking? Authorizing Provider  albuterol (PROVENTIL HFA;VENTOLIN HFA) 108 (90 BASE) MCG/ACT inhaler Inhale 2 puffs into the lungs every 6 (six) hours as needed for wheezing or shortness of breath.    Historical Provider, MD  albuterol (PROVENTIL) (2.5 MG/3ML) 0.083% nebulizer solution Take 3 mLs (2.5 mg total) by nebulization every 4 (four) hours as needed for wheezing  or shortness of breath. 09/14/14   Nicole Pisciotta, PA-C  cyclobenzaprine (FLEXERIL) 5 MG tablet Take 5 mg by mouth 3 (three) times daily as needed for muscle spasms.    Historical Provider, MD  ibuprofen (ADVIL,MOTRIN) 800 MG tablet Take 800 mg every 8 hours with food for 5 days 11/04/14   Leone BrandLaura R Ingold, NP  metroNIDAZOLE (FLAGYL) 500 MG tablet Take 4 tablets (2,000 mg total) by mouth once. 11/24/14   Allie BossierMyra C Dove, MD  misoprostol (CYTOTEC) 200 MCG tablet Take 3 pills by mouth the night before biopsy. 11/23/14   Allie BossierMyra C Dove, MD   BP 129/103 mmHg  Pulse 80  Temp(Src) 99.4 F (37.4 C) (Oral)  Resp 19  Ht 5\' 1"  (1.549 m)  Wt 207 lb (93.895 kg)  BMI 39.13 kg/m2  SpO2 97%  LMP 11/23/2014 Physical Exam  Nursing note and vitals reviewed.  44 year old female, resting comfortably and in no acute distress. However, she does have difficulty completing a sentence without taking a second breath. Vital signs are significant for hypertension. Oxygen saturation is 97%, which is normal. Head is normocephalic and atraumatic. PERRLA, EOMI. Oropharynx is clear. Neck is nontender and supple without adenopathy or JVD. Back is nontender and there is no CVA tenderness. Lungs have diffuse inspiratory and expiratory wheezes. There are no rales or rhonchi.  Chest is nontender. Heart has regular rate and rhythm without murmur. Abdomen is soft, flat, nontender without masses or hepatosplenomegaly and peristalsis is normoactive. Extremities have no cyanosis or edema, full range of motion is present. Skin is warm and dry without rash. Neurologic: Mental status is normal, cranial nerves are intact, there are no motor or sensory deficits.  ED Course  Procedures (including critical care time)  Imaging Review Dg Chest 2 View  12/07/2014   CLINICAL DATA:  Wheezing today.  Cough.  EXAM: CHEST  2 VIEW  COMPARISON:  None.  FINDINGS: The heart size and mediastinal contours are within normal limits. Both lungs are clear. The  visualized skeletal structures are unremarkable.  IMPRESSION: No active cardiopulmonary disease.   Electronically Signed   By: Burman NievesWilliam  Stevens M.D.   On: 12/07/2014 06:50     MDM   Final diagnoses:  Cough  Asthma exacerbation    Acute exacerbation of asthma. Chest x-ray or be obtained to evaluate for possible pneumonia. She will be given albuterol with ipratropium by nebulizer and also started on prednisone. Old records are reviewed confirming several ED visits for asthma exacerbation. Last hospitalization for asthma was in April 2015.  She had dramatic improvement with a single nebulizer treatment. On reexam, lungs are completely clear. She is discharged with prescription for prednisone.. She states that her albuterol inhaler had run out so she is given an inhaler to take home. She also needs more solution for home nebulizer and she is given a prescription for albuterol solution for nebulizer.  Dione Boozeavid [employer], MD 12/07/14 [phone]

## 2014-12-07 NOTE — ED Notes (Signed)
Patient coming from home with acute asthma attack.  Patient c/o of SOB and productive cough.   On assess in ED patient has expiratory and inspiratory wheezing.

## 2014-12-07 NOTE — Discharge Instructions (Signed)
Asthma °Asthma is a recurring condition in which the airways tighten and narrow. Asthma can make it difficult to breathe. It can cause coughing, wheezing, and shortness of breath. Asthma episodes, also called asthma attacks, range from minor to life-threatening. Asthma cannot be cured, but medicines and lifestyle changes can help control it. °CAUSES °Asthma is believed to be caused by inherited (genetic) and environmental factors, but its exact cause is unknown. Asthma may be triggered by allergens, lung infections, or irritants in the air. Asthma triggers are different for each person. Common triggers include:  °· Animal dander. °· Dust mites. °· Cockroaches. °· Pollen from trees or grass. °· Mold. °· Smoke. °· Air pollutants such as dust, household cleaners, hair sprays, aerosol sprays, paint fumes, strong chemicals, or strong odors. °· Cold air, weather changes, and winds (which increase molds and pollens in the air). °· Strong emotional expressions such as crying or laughing hard. °· Stress. °· Certain medicines (such as aspirin) or types of drugs (such as beta-blockers). °· Sulfites in foods and drinks. Foods and drinks that may contain sulfites include dried fruit, potato chips, and sparkling grape juice. °· Infections or inflammatory conditions such as the flu, a cold, or an inflammation of the nasal membranes (rhinitis). °· Gastroesophageal reflux disease (GERD). °· Exercise or strenuous activity. °SYMPTOMS °Symptoms may occur immediately after asthma is triggered or many hours later. Symptoms include: °· Wheezing. °· Excessive nighttime or early morning coughing. °· Frequent or severe coughing with a common cold. °· Chest tightness. °· Shortness of breath. °DIAGNOSIS  °The diagnosis of asthma is made by a review of your medical history and a physical exam. Tests may also be performed. These may include: °· Lung function studies. These tests show how much air you breathe in and out. °· Allergy  tests. °· Imaging tests such as X-rays. °TREATMENT  °Asthma cannot be cured, but it can usually be controlled. Treatment involves identifying and avoiding your asthma triggers. It also involves medicines. There are 2 classes of medicine used for asthma treatment:  °· Controller medicines. These prevent asthma symptoms from occurring. They are usually taken every day. °· Reliever or rescue medicines. These quickly relieve asthma symptoms. They are used as needed and provide short-term relief. °Your health care provider will help you create an asthma action plan. An asthma action plan is a written plan for managing and treating your asthma attacks. It includes a list of your asthma triggers and how they may be avoided. It also includes information on when medicines should be taken and when their dosage should be changed. An action plan may also involve the use of a device called a peak flow meter. A peak flow meter measures how well the lungs are working. It helps you monitor your condition. °HOME CARE INSTRUCTIONS  °· Take medicines only as directed by your health care provider. Speak with your health care provider if you have questions about how or when to take the medicines. °· Use a peak flow meter as directed by your health care provider. Record and keep track of readings. °· Understand and use the action plan to help minimize or stop an asthma attack without needing to seek medical care. °· Control your home environment in the following ways to help prevent asthma attacks: °¨ Do not smoke. Avoid being exposed to secondhand smoke. °¨ Change your heating and air conditioning filter regularly. °¨ Limit your use of fireplaces and wood stoves. °¨ Get rid of pests (such as roaches and   mice) and their droppings. °¨ Throw away plants if you see mold on them. °¨ Clean your floors and dust regularly. Use unscented cleaning products. °¨ Try to have someone else vacuum for you regularly. Stay out of rooms while they are  being vacuumed and for a short while afterward. If you vacuum, use a dust mask from a hardware store, a double-layered or microfilter vacuum cleaner bag, or a vacuum cleaner with a HEPA filter. °¨ Replace carpet with wood, tile, or vinyl flooring. Carpet can trap dander and dust. °¨ Use allergy-proof pillows, mattress covers, and box spring covers. °¨ Wash bed sheets and blankets every week in hot water and dry them in a dryer. °¨ Use blankets that are made of polyester or cotton. °¨ Clean bathrooms and kitchens with bleach. If possible, have someone repaint the walls in these rooms with mold-resistant paint. Keep out of the rooms that are being cleaned and painted. °¨ Wash hands frequently. °SEEK MEDICAL CARE IF:  °· You have wheezing, shortness of breath, or a cough even if taking medicine to prevent attacks. °· The colored mucus you cough up (sputum) is thicker than usual. °· Your sputum changes from clear or white to yellow, green, gray, or bloody. °· You have any problems that may be related to the medicines you are taking (such as a rash, itching, swelling, or trouble breathing). °· You are using a reliever medicine more than 2-3 times per week. °· Your peak flow is still at 50-79% of your personal best after following your action plan for 1 hour. °· You have a fever. °SEEK IMMEDIATE MEDICAL CARE IF:  °· You seem to be getting worse and are unresponsive to treatment during an asthma attack. °· You are short of breath even at rest. °· You get short of breath when doing very little physical activity. °· You have difficulty eating, drinking, or talking due to asthma symptoms. °· You develop chest pain. °· You develop a fast heartbeat. °· You have a bluish color to your lips or fingernails. °· You are light-headed, dizzy, or faint. °· Your peak flow is less than 50% of your personal best. °MAKE SURE YOU:  °· Understand these instructions. °· Will watch your condition. °· Will get help right away if you are not  doing well or get worse. °Document Released: 07/16/2005 Document Revised: 11/30/2013 Document Reviewed: 02/12/2013 °[employer]® Patient Information ©2015 [employer]. This information is not intended to replace advice given to you by your health care provider. Make sure you discuss any questions you have with your health care provider. ° °Prednisone tablets °What is this medicine? °PREDNISONE (PRED ni sone) is a corticosteroid. It is commonly used to treat inflammation of the skin, joints, lungs, and other organs. Common conditions treated include asthma, allergies, and arthritis. It is also used for other conditions, such as blood disorders and diseases of the adrenal glands. °This medicine may be used for other purposes; ask your health care provider or pharmacist if you have questions. °COMMON BRAND NAME(S): Deltasone, Predone, Sterapred, Sterapred DS °What should I tell my health care provider before I take this medicine? °They need to know if you have any of these conditions: °-Cushing's syndrome °-diabetes °-glaucoma °-heart disease °-high blood pressure °-infection (especially a virus infection such as chickenpox, cold sores, or herpes) °-kidney disease °-liver disease °-mental illness °-myasthenia gravis °-osteoporosis °-seizures °-stomach or intestine problems °-thyroid disease °-an unusual or allergic reaction to lactose, prednisone, other medicines, foods, dyes, or preservatives °-pregnant   or trying to get pregnant -breast-feeding How should I use this medicine? Take this medicine by mouth with a glass of water. Follow the directions on the prescription label. Take this medicine with food. If you are taking this medicine once a day, take it in the morning. Do not take more medicine than you are told to take. Do not suddenly stop taking your medicine because you may develop a severe reaction. Your doctor will tell you how much medicine to take. If your doctor wants you to stop the medicine, the dose may  be slowly lowered over time to avoid any side effects. Talk to your pediatrician regarding the use of this medicine in children. Special care may be needed. Overdosage: If you think you have taken too much of this medicine contact a poison control center or emergency room at once. NOTE: This medicine is only for you. Do not share this medicine with others. What if I miss a dose? If you miss a dose, take it as soon as you can. If it is almost time for your next dose, talk to your doctor or health care professional. You may need to miss a dose or take an extra dose. Do not take double or extra doses without advice. What may interact with this medicine? Do not take this medicine with any of the following medications: -metyrapone -mifepristone This medicine may also interact with the following medications: -aminoglutethimide -amphotericin B -aspirin and aspirin-like medicines -barbiturates -certain medicines for diabetes, like glipizide or glyburide -cholestyramine -cholinesterase inhibitors -cyclosporine -digoxin -diuretics -ephedrine -female hormones, like estrogens and birth control pills -isoniazid -ketoconazole -NSAIDS, medicines for pain and inflammation, like ibuprofen or naproxen -phenytoin -rifampin -toxoids -vaccines -warfarin This list may not describe all possible interactions. Give your health care provider a list of all the medicines, herbs, non-prescription drugs, or dietary supplements you use. Also tell them if you smoke, drink alcohol, or use illegal drugs. Some items may interact with your medicine. What should I watch for while using this medicine? Visit your doctor or health care professional for regular checks on your progress. If you are taking this medicine over a prolonged period, carry an identification card with your name and address, the type and dose of your medicine, and your doctor's name and address. This medicine may increase your risk of getting an  infection. Tell your doctor or health care professional if you are around anyone with measles or chickenpox, or if you develop sores or blisters that do not heal properly. If you are going to have surgery, tell your doctor or health care professional that you have taken this medicine within the last twelve months. Ask your doctor or health care professional about your diet. You may need to lower the amount of salt you eat. This medicine may affect blood sugar levels. If you have diabetes, check with your doctor or health care professional before you change your diet or the dose of your diabetic medicine. What side effects may I notice from receiving this medicine? Side effects that you should report to your doctor or health care professional as soon as possible: -allergic reactions like skin rash, itching or hives, swelling of the face, lips, or tongue -changes in emotions or moods -changes in vision -depressed mood -eye pain -fever or chills, cough, sore throat, pain or difficulty passing urine -increased thirst -swelling of ankles, feet Side effects that usually do not require medical attention (report to your doctor or health care professional if they continue or are  bothersome): -confusion, excitement, restlessness -headache -nausea, vomiting -skin problems, acne, thin and shiny skin -trouble sleeping -weight gain This list may not describe all possible side effects. Call your doctor for medical advice about side effects. You may report side effects to FDA at 1-800-FDA-1088. Where should I keep my medicine? Keep out of the reach of children. Store at room temperature between 15 and 30 degrees C (59 and 86 degrees F). Protect from light. Keep container tightly closed. Throw away any unused medicine after the expiration date. NOTE: This sheet is a summary. It may not cover all possible information. If you have questions about this medicine, talk to your doctor, pharmacist, or health care  provider.  2015, [employer]. (2011-03-01 10:57:14)

## 2014-12-22 ENCOUNTER — Ambulatory Visit: Payer: 59 | Admitting: Obstetrics & Gynecology

## 2015-01-14 ENCOUNTER — Encounter: Payer: Self-pay | Admitting: Obstetrics & Gynecology

## 2015-01-14 ENCOUNTER — Ambulatory Visit (INDEPENDENT_AMBULATORY_CARE_PROVIDER_SITE_OTHER): Payer: 59 | Admitting: Obstetrics & Gynecology

## 2015-01-14 VITALS — BP 148/88 | HR 86 | Temp 98.0°F

## 2015-01-14 DIAGNOSIS — N946 Dysmenorrhea, unspecified: Secondary | ICD-10-CM | POA: Diagnosis not present

## 2015-01-14 LAB — POCT PREGNANCY, URINE: Preg Test, Ur: NEGATIVE

## 2015-01-14 MED ORDER — METRONIDAZOLE 500 MG PO TABS: ORAL_TABLET | ORAL | Status: DC

## 2015-01-14 NOTE — Progress Notes (Signed)
   Subjective:    Patient ID: Wendy James, female    DOB: 1970/09/10, 44 y.o.   MRN: [id number]  HPI This 44 yo AA lady is here with her BF for her EMBX. She took cytotec last night.   Review of Systems She took flagyl for the trich seen on pap but BF did not. They had sex after that.    Objective:   Physical Exam  I attempted a Pipelle EMBX but she was not able to tolerate the pressure required to push the Pipelle through her stenotic cervix.  WNWHBFNAD Breathing and ambulating normally Abd- benign, obese      Assessment & Plan:  Trich- treat both at the same time and then condoms for a week DUB/severe dysmenorrhea- She is adament that she wants a hysterectomy. I have explained that with her h/o 4 cesarean sections that she may have significant adhesions that could result in damage to bowel and/or bladder. She says that she "trusts God" and still wants to do it.  Because I was not able to pass the Pipelle through her stenotic cervix, she will need a d&c/H/S prior to scheduling a hysterectomy.

## 2015-01-18 ENCOUNTER — Encounter (HOSPITAL_COMMUNITY): Payer: Self-pay | Admitting: *Deleted

## 2015-01-23 ENCOUNTER — Emergency Department (HOSPITAL_COMMUNITY): Payer: 59

## 2015-01-23 ENCOUNTER — Emergency Department (HOSPITAL_COMMUNITY)
Admission: EM | Admit: 2015-01-23 | Discharge: 2015-01-23 | Disposition: A | Discharge status: Home / Self Care | Payer: 59 | Attending: Emergency Medicine | Admitting: Emergency Medicine

## 2015-01-23 ENCOUNTER — Encounter (HOSPITAL_COMMUNITY): Payer: Self-pay | Admitting: Emergency Medicine

## 2015-01-23 DIAGNOSIS — Z79899 Other long term (current) drug therapy: Secondary | ICD-10-CM | POA: Diagnosis not present

## 2015-01-23 DIAGNOSIS — M1990 Unspecified osteoarthritis, unspecified site: Secondary | ICD-10-CM | POA: Insufficient documentation

## 2015-01-23 DIAGNOSIS — Z87891 Personal history of nicotine dependence: Secondary | ICD-10-CM | POA: Insufficient documentation

## 2015-01-23 DIAGNOSIS — J45901 Unspecified asthma with (acute) exacerbation: Secondary | ICD-10-CM | POA: Insufficient documentation

## 2015-01-23 DIAGNOSIS — J069 Acute upper respiratory infection, unspecified: Secondary | ICD-10-CM | POA: Diagnosis not present

## 2015-01-23 DIAGNOSIS — H9202 Otalgia, left ear: Secondary | ICD-10-CM | POA: Diagnosis not present

## 2015-01-23 DIAGNOSIS — B9789 Other viral agents as the cause of diseases classified elsewhere: Secondary | ICD-10-CM

## 2015-01-23 DIAGNOSIS — I10 Essential (primary) hypertension: Secondary | ICD-10-CM

## 2015-01-23 DIAGNOSIS — Z7952 Long term (current) use of systemic steroids: Secondary | ICD-10-CM | POA: Insufficient documentation

## 2015-01-23 DIAGNOSIS — R05 Cough: Secondary | ICD-10-CM | POA: Diagnosis present

## 2015-01-23 DIAGNOSIS — J988 Other specified respiratory disorders: Secondary | ICD-10-CM

## 2015-01-23 MED ORDER — OXYMETAZOLINE HCL 0.05 % NA SOLN: 1.0000 | Freq: Two times a day (BID) | NASAL | Status: DC

## 2015-01-23 MED ORDER — PREDNISONE 20 MG PO TABS: 40.0000 mg | ORAL_TABLET | Freq: Every day | ORAL | Status: DC

## 2015-01-23 MED ORDER — ALBUTEROL SULFATE (2.5 MG/3ML) 0.083% IN NEBU: 2.5000 mg | INHALATION_SOLUTION | Freq: Four times a day (QID) | RESPIRATORY_TRACT | Status: DC | PRN

## 2015-01-23 MED ORDER — ALBUTEROL SULFATE HFA 108 (90 BASE) MCG/ACT IN AERS: 2.0000 | INHALATION_SPRAY | Freq: Four times a day (QID) | RESPIRATORY_TRACT | Status: DC | PRN

## 2015-01-23 MED ORDER — IPRATROPIUM-ALBUTEROL 0.5-2.5 (3) MG/3ML IN SOLN
3.0000 mL | Freq: Once | RESPIRATORY_TRACT | Status: AC
  Administered 2015-01-23: 3 mL via RESPIRATORY_TRACT
  Filled 2015-01-23: qty 3

## 2015-01-23 MED ORDER — GUAIFENESIN 100 MG/5ML PO LIQD: 100.0000 mg | ORAL | Status: DC | PRN

## 2015-01-23 MED ORDER — PREDNISONE 20 MG PO TABS
40.0000 mg | ORAL_TABLET | Freq: Once | ORAL | Status: AC
  Administered 2015-01-23: 40 mg via ORAL
  Filled 2015-01-23: qty 2

## 2015-01-23 MED ORDER — GUAIFENESIN 100 MG/5ML PO SOLN
5.0000 mL | Freq: Once | ORAL | Status: AC
  Administered 2015-01-23: 100 mg via ORAL
  Filled 2015-01-23: qty 5

## 2015-01-23 NOTE — Discharge Instructions (Signed)
Please follow up with your primary care physician in 1-2 days. If you do not have one please call the [facility] number listed above. Please have someone recheck your blood pressure in 1 week as it was elevated at your visit today. Please read all discharge instructions and return precautions.   Upper Respiratory Infection, Adult An upper respiratory infection (URI) is also sometimes known as the common cold. The upper respiratory tract includes the nose, sinuses, throat, trachea, and bronchi. Bronchi are the airways leading to the lungs. Most people improve within 1 week, but symptoms can last up to 2 weeks. A residual cough may last even longer.  CAUSES Many different viruses can infect the tissues lining the upper respiratory tract. The tissues become irritated and inflamed and often become very moist. Mucus production is also common. A cold is contagious. You can easily spread the virus to others by oral contact. This includes kissing, sharing a glass, coughing, or sneezing. Touching your mouth or nose and then touching a surface, which is then touched by another person, can also spread the virus. SYMPTOMS  Symptoms typically develop 1 to 3 days after you come in contact with a cold virus. Symptoms vary from person to person. They may include:  Runny nose.  Sneezing.  Nasal congestion.  Sinus irritation.  Sore throat.  Loss of voice (laryngitis).  Cough.  Fatigue.  Muscle aches.  Loss of appetite.  Headache.  Low-grade fever. DIAGNOSIS  You might diagnose your own cold based on familiar symptoms, since most people get a cold 2 to 3 times a year. Your caregiver can confirm this based on your exam. Most importantly, your caregiver can check that your symptoms are not due to another disease such as strep throat, sinusitis, pneumonia, asthma, or epiglottitis. Blood tests, throat tests, and X-rays are not necessary to diagnose a common cold, but they may sometimes  be helpful in excluding other more serious diseases. Your caregiver will decide if any further tests are required. RISKS AND COMPLICATIONS  You may be at risk for a more severe case of the common cold if you smoke cigarettes, have chronic heart disease (such as heart failure) or lung disease (such as asthma), or if you have a weakened immune system. The very young and very old are also at risk for more serious infections. Bacterial sinusitis, middle ear infections, and bacterial pneumonia can complicate the common cold. The common cold can worsen asthma and chronic obstructive pulmonary disease (COPD). Sometimes, these complications can require emergency medical care and may be life-threatening. PREVENTION  The best way to protect against getting a cold is to practice good hygiene. Avoid oral or hand contact with people with cold symptoms. Wash your hands often if contact occurs. There is no clear evidence that vitamin C, vitamin E, echinacea, or exercise reduces the chance of developing a cold. However, it is always recommended to get plenty of rest and practice good nutrition. TREATMENT  Treatment is directed at relieving symptoms. There is no cure. Antibiotics are not effective, because the infection is caused by a virus, not by bacteria. Treatment may include:  Increased fluid intake. Sports drinks offer valuable electrolytes, sugars, and fluids.  Breathing heated mist or steam (vaporizer or shower).  Eating chicken soup or other clear broths, and maintaining good nutrition.  Getting plenty of rest.  Using gargles or lozenges for comfort.  Controlling fevers with ibuprofen or acetaminophen as directed by your caregiver.  Increasing usage of your  inhaler if you have asthma. Zinc gel and zinc lozenges, taken in the first 24 hours of the common cold, can shorten the duration and lessen the severity of symptoms. Pain medicines may help with fever, muscle aches, and throat pain. A variety of  non-prescription medicines are available to treat congestion and runny nose. Your caregiver can make recommendations and may suggest nasal or lung inhalers for other symptoms.  HOME CARE INSTRUCTIONS   Only take over-the-counter or prescription medicines for pain, discomfort, or fever as directed by your caregiver.  Use a warm mist humidifier or inhale steam from a shower to increase air moisture. This may keep secretions moist and make it easier to breathe.  Drink enough water and fluids to keep your urine clear or pale yellow.  Rest as needed.  Return to work when your temperature has returned to normal or as your caregiver advises. You may need to stay home longer to avoid infecting others. You can also use a face mask and careful hand washing to prevent spread of the virus. SEEK MEDICAL CARE IF:   After the first few days, you feel you are getting worse rather than better.  You need your caregiver's advice about medicines to control symptoms.  You develop chills, worsening shortness of breath, or brown or red sputum. These may be signs of pneumonia.  You develop yellow or brown nasal discharge or pain in the face, especially when you bend forward. These may be signs of sinusitis.  You develop a fever, swollen neck glands, pain with swallowing, or white areas in the back of your throat. These may be signs of strep throat. SEEK IMMEDIATE MEDICAL CARE IF:   You have a fever.  You develop severe or persistent headache, ear pain, sinus pain, or chest pain.  You develop wheezing, a prolonged cough, cough up blood, or have a change in your usual mucus (if you have chronic lung disease).  You develop sore muscles or a stiff neck. Document Released: 01/09/2001 Document Revised: 10/08/2011 Document Reviewed: 10/21/2013 [facility] Patient Information 2015 [place], [name]. This information is not intended to replace advice given to you by your health care provider. Make sure you discuss any  questions you have with your health care provider. Hypertension Hypertension, commonly called high blood pressure, is when the force of blood pumping through your arteries is too strong. Your arteries are the blood vessels that carry blood from your heart throughout your body. A blood pressure reading consists of a higher number over a lower number, such as 110/72. The higher number (systolic) is the pressure inside your arteries when your heart pumps. The lower number (diastolic) is the pressure inside your arteries when your heart relaxes. Ideally you want your blood pressure below 120/80. Hypertension forces your heart to work harder to pump blood. Your arteries may become narrow or stiff. Having hypertension puts you at risk for heart disease, stroke, and other problems.  RISK FACTORS Some risk factors for high blood pressure are controllable. Others are not.  Risk factors you cannot control include:   Race. You may be at higher risk if you are African American.  Age. Risk increases with age.  Gender. Men are at higher risk than women before age 40 years. After age 29, women are at higher risk than men. Risk factors you can control include:  Not getting enough exercise or physical activity.  Being overweight.  Getting too much fat, sugar, calories, or salt in your diet.  Drinking too much  alcohol. SIGNS AND SYMPTOMS Hypertension does not usually cause signs or symptoms. Extremely high blood pressure (hypertensive crisis) may cause headache, anxiety, shortness of breath, and nosebleed. DIAGNOSIS  To check if you have hypertension, your health care provider will measure your blood pressure while you are seated, with your arm held at the level of your heart. It should be measured at least twice using the same arm. Certain conditions can cause a difference in blood pressure between your right and left arms. A blood pressure reading that is higher than normal on one occasion does not mean  that you need treatment. If one blood pressure reading is high, ask your health care provider about having it checked again. TREATMENT  Treating high blood pressure includes making lifestyle changes and possibly taking medicine. Living a healthy lifestyle can help lower high blood pressure. You may need to change some of your habits. Lifestyle changes may include:  Following the DASH diet. This diet is high in fruits, vegetables, and whole grains. It is low in salt, red meat, and added sugars.  Getting at least 2 hours of brisk physical activity every week.  Losing weight if necessary.  Not smoking.  Limiting alcoholic beverages.  Learning ways to reduce stress. If lifestyle changes are not enough to get your blood pressure under control, your health care provider may prescribe medicine. You may need to take more than one. Work closely with your health care provider to understand the risks and benefits. HOME CARE INSTRUCTIONS  Have your blood pressure rechecked as directed by your health care provider.   Take medicines only as directed by your health care provider. Follow the directions carefully. Blood pressure medicines must be taken as prescribed. The medicine does not work as well when you skip doses. Skipping doses also puts you at risk for problems.   Do not smoke.   Monitor your blood pressure at home as directed by your health care provider. SEEK MEDICAL CARE IF:   You think you are having a reaction to medicines taken.  You have recurrent headaches or feel dizzy.  You have swelling in your ankles.  You have trouble with your vision. SEEK IMMEDIATE MEDICAL CARE IF:  You develop a severe headache or confusion.  You have unusual weakness, numbness, or feel faint.  You have severe chest or abdominal pain.  You vomit repeatedly.  You have trouble breathing. MAKE SURE YOU:   Understand these instructions.  Will watch your condition.  Will get help right  away if you are not doing well or get worse. Document Released: 07/16/2005 Document Revised: 11/30/2013 Document Reviewed: 05/08/2013 [facility] Patient Information 2015 [place], [name]. This information is not intended to replace advice given to you by your health care provider. Make sure you discuss any questions you have with your health care provider.

## 2015-01-23 NOTE — ED Notes (Signed)
C/o productive cough with yellow phlegm, L ear pain, sore throat, and congestion x 2 days.  Taking OTC meds without relief.

## 2015-01-23 NOTE — ED Provider Notes (Signed)
History  This chart was scribed for non-physician practitioner, Francee Piccolo, PA-C,working with Elwin Mocha, MD, by Karle Plumber, ED Scribe. This patient was seen in room TR09C/TR09C and the patient's care was started at 6:27 PM.  Chief Complaint  Patient presents with  . Nasal Congestion  . Cough  . Otalgia   The history is provided by the patient and medical records. No language interpreter was used.    HPI Comments:  Wendy James is a 44 y.o. female who presents to the Emergency Department complaining of productive cough of yellow phlegm and congestion that began about two days ago. She reports associated fever Tmax 101 degrees. Pt also reports left ear pain, vomiting and diarrhea. She states she has been using her nebulizer machine and MDI 5-6 times per day with no significant relief of her symptoms. Pt also reports taking Benadyl, Sudafed and Mucinex with minimal relief. She denies modifying factors of her symptoms. Denies chest tightness, hemoptysis, hematemesis, CP or SOB.   Past Medical History  Diagnosis Date  . Asthma   . Seasonal allergies   . Arthritis    Past Surgical History  Procedure Laterality Date  . Cholecystectomy    . Cesarean section    . Tubal ligation     Family History  Problem Relation Age of Onset  . Hypertension Mother   . Stroke Sister   . Seizures Sister    History  Substance Use Topics  . Smoking status: Former Smoker    Quit date: 11/17/1988  . Smokeless tobacco: Never Used  . Alcohol Use: No   OB History    Gravida Para Term Preterm AB TAB SAB Ectopic Multiple Living   5 4 4  0 1 0 1 0 0 3     Review of Systems  Constitutional: Positive for fever.  HENT: Positive for congestion and ear pain.   Respiratory: Positive for cough and wheezing. Negative for shortness of breath.   Cardiovascular: Negative for chest pain.  Gastrointestinal: Positive for nausea and vomiting.  All other systems reviewed and are  negative.   Allergies  Shrimp  Home Medications   Prior to Admission medications   Medication Sig Start Date End Date Taking? Authorizing Provider  albuterol (PROVENTIL HFA;VENTOLIN HFA) 108 (90 BASE) MCG/ACT inhaler Inhale 2 puffs into the lungs every 6 (six) hours as needed for wheezing or shortness of breath. 01/23/15   Francee Piccolo, PA-C  albuterol (PROVENTIL) (2.5 MG/3ML) 0.083% nebulizer solution Take 3 mLs (2.5 mg total) by nebulization every 4 (four) hours as needed for wheezing or shortness of breath. 12/07/14   Dione Booze, MD  albuterol (PROVENTIL) (2.5 MG/3ML) 0.083% nebulizer solution Take 3 mLs (2.5 mg total) by nebulization every 6 (six) hours as needed for wheezing or shortness of breath. 01/23/15   Francee Piccolo, PA-C  guaiFENesin (ROBITUSSIN) 100 MG/5ML liquid Take 5-10 mLs (100-200 mg total) by mouth every 4 (four) hours as needed for cough. 01/23/15   Francee Piccolo, PA-C  ibuprofen (ADVIL,MOTRIN) 800 MG tablet Take 800 mg every 8 hours with food for 5 days Patient not taking: Reported on 12/07/2014 11/04/14   Leone Brand, NP  metroNIDAZOLE (FLAGYL) 500 MG tablet Take 4 tablets (2,000 mg total) by mouth once. Patient not taking: Reported on 12/07/2014 11/24/14   Allie Bossier, MD  metroNIDAZOLE (FLAGYL) 500 MG tablet Take two tablets by mouth twice a day, for one day.  Or you can take all four tablets at once if you can  tolerate it. 01/14/15   Allie Bossier, MD  misoprostol (CYTOTEC) 200 MCG tablet Take 3 pills by mouth the night before biopsy. Patient not taking: Reported on 12/07/2014 11/23/14   Allie Bossier, MD  oxymetazoline (AFRIN NASAL SPRAY) 0.05 % nasal spray Place 1 spray into both nostrils 2 (two) times daily. 01/23/15   Mayra Jolliffe, PA-C  predniSONE (DELTASONE) 20 MG tablet Take 2 tablets (40 mg total) by mouth daily. 01/23/15   Tanaisha Pittman, PA-C  predniSONE (DELTASONE) 50 MG tablet Take 1 tablet (50 mg total) by mouth daily. 12/07/14    Dione Booze, MD   Triage Vitals: BP 169/103 mmHg  Pulse 83  Temp(Src) 99.4 F (37.4 C) (Oral)  Resp 22  Ht 5\' 1"  (1.549 m)  Wt 208 lb (94.348 kg)  BMI 39.32 kg/m2  SpO2 99%  LMP 12/29/2014 Physical Exam  Constitutional: She is oriented to person, place, and time. She appears well-developed and well-nourished. No distress.  HENT:  Head: Normocephalic and atraumatic.  Right Ear: Hearing, tympanic membrane, external ear and ear canal normal.  Left Ear: Hearing and external ear normal.  Nose: Rhinorrhea present.  Mouth/Throat: Uvula is midline, oropharynx is clear and moist and mucous membranes are normal. No oropharyngeal exudate.  Left cerumen impaction.  Eyes: Conjunctivae are normal.  Neck: Normal range of motion. Neck supple.  No nuchal rigidity.   Cardiovascular: Normal rate, regular rhythm and normal heart sounds.   Pulmonary/Chest: Effort normal. No respiratory distress. She has wheezes (expiratory).  Abdominal: Soft. There is no tenderness.  Musculoskeletal: Normal range of motion.  Neurological: She is alert and oriented to person, place, and time.  Skin: Skin is warm and dry. She is not diaphoretic.  Psychiatric: She has a normal mood and affect.  Nursing note and vitals reviewed.   ED Course  Procedures (including critical care time) DIAGNOSTIC STUDIES: Oxygen Saturation is 99% on RA, normal by my interpretation.   COORDINATION OF CARE: 6:30 PM- Will order CXR and nebulizer treatment. Pt verbalizes understanding and agrees to plan.  Medications  ipratropium-albuterol (DUONEB) 0.5-2.5 (3) MG/3ML nebulizer solution 3 mL (3 mLs Nebulization Given 01/23/15 1850)  guaiFENesin (ROBITUSSIN) 100 MG/5ML solution 100 mg (100 mg Oral Given 01/23/15 1849)  predniSONE (DELTASONE) tablet 40 mg (40 mg Oral Given 01/23/15 1849)    Labs Review Labs Reviewed - No data to display  Imaging Review Dg Chest 2 View  01/23/2015   CLINICAL DATA:  Cough  EXAM: CHEST - 2 VIEW   COMPARISON:  12/07/2014  FINDINGS: The heart size and mediastinal contours are within normal limits. Both lungs are clear. The visualized skeletal structures are unremarkable.  IMPRESSION: No active disease.   Electronically Signed   By: Alcide Clever M.D.   On: 01/23/2015 18:28     EKG Interpretation None      7:11 PM On re-evaluation wheezing resolved. Discussed x-ray results with patient.  MDM   Final diagnoses:  Viral respiratory illness  Essential hypertension    Filed Vitals:   01/23/15 1920  BP: 133/85  Pulse: 79  Temp: 98.1 F (36.7 C)  Resp: 16   Afebrile, NAD, non-toxic appearing, AAOx4.  Patients symptoms are consistent with URI, likely viral etiology. No hypoxia or fever to suggest pneumonia. CXR unremarkable. I personally reviewed the imaging and agree with the radiologist. Lungs clear to auscultation bilaterally after Duoneb administration. No nuchal rigidity or toxicities to suggest meningitis. Discussed that antibiotics are not indicated for viral infections. Pt will  be discharged with symptomatic treatment.  Patient verbalizes understanding and is agreeable with plan. Pt is hemodynamically stable at time of discharge.    I personally performed the services described in this documentation, which was scribed in my presence. The recorded information has been reviewed and is accurate.    Francee Piccolo, PA-C 01/23/15 1942  Elwin Mocha, MD 01/23/15 2329

## 2015-01-25 ENCOUNTER — Telehealth: Payer: Self-pay | Admitting: General Practice

## 2015-01-25 NOTE — Telephone Encounter (Signed)
Patient called and left message stating she would like a callback as soon as possible, it's about her surgery. Called patient, no answer- left message stating we are trying to reach you to discuss your concerns, please call us back at the clinics

## 2015-01-27 ENCOUNTER — Encounter (HOSPITAL_COMMUNITY): Payer: Self-pay | Admitting: *Deleted

## 2015-01-28 NOTE — Telephone Encounter (Signed)
Called patient stating I am trying to return your phone call from the other day to answer your questions. Patient states someone already called me yesterday and answered my questions. Patient had no questions

## 2015-02-17 NOTE — Anesthesia Preprocedure Evaluation (Addendum)
Anesthesia Evaluation  Patient identified by MRN, date of birth, ID band Patient awake    Reviewed: Allergy & Precautions, H&P , NPO status , Patient's Chart, lab work & pertinent test results, reviewed documented beta blocker date and time , Unable to perform ROS - Chart review only  History of Anesthesia Complications Negative for: history of anesthetic complications  Airway Mallampati: II  TM Distance: >3 FB Neck ROM: full    Dental no notable dental hx.    Pulmonary asthma , former smoker,  breath sounds clear to auscultation  Pulmonary exam normal       Cardiovascular negative cardio ROS Normal cardiovascular examRhythm:regular Rate:Normal     Neuro/Psych negative neurological ROS  negative psych ROS   GI/Hepatic negative GI ROS, Neg liver ROS,   Endo/Other  Morbid obesity  Renal/GU negative Renal ROS     Musculoskeletal  (+) Arthritis -,   Abdominal   Peds  Hematology  (+) anemia ,   Anesthesia Other Findings Negative stress Echo and stress EKG 11/16/2014  Reproductive/Obstetrics negative OB ROS                           Anesthesia Physical Anesthesia Plan  ASA: III  Anesthesia Plan: General   Post-op Pain Management:    Induction:   Airway Management Planned: LMA  Additional Equipment:   Intra-op Plan:   Post-operative Plan:   Informed Consent: I have reviewed the patients History and Physical, chart, labs and discussed the procedure including the risks, benefits and alternatives for the proposed anesthesia with the patient or authorized representative who has indicated his/her understanding and acceptance.   Dental Advisory Given  Plan Discussed with: Anesthesiologist  Anesthesia Plan Comments:         Anesthesia Quick Evaluation

## 2015-02-18 ENCOUNTER — Ambulatory Visit (HOSPITAL_COMMUNITY): Payer: 59 | Admitting: Anesthesiology

## 2015-02-18 ENCOUNTER — Ambulatory Visit (HOSPITAL_COMMUNITY)
Admission: RE | Admit: 2015-02-18 | Discharge: 2015-02-18 | Disposition: A | Discharge status: Home / Self Care | Payer: 59 | Source: Ambulatory Visit | Attending: Obstetrics & Gynecology | Admitting: Obstetrics & Gynecology

## 2015-02-18 ENCOUNTER — Encounter (HOSPITAL_COMMUNITY): Payer: Self-pay | Admitting: Anesthesiology

## 2015-02-18 ENCOUNTER — Encounter (HOSPITAL_COMMUNITY)
Admission: RE | Disposition: A | Discharge status: Home / Self Care | Payer: Self-pay | Source: Ambulatory Visit | Attending: Obstetrics & Gynecology

## 2015-02-18 DIAGNOSIS — N840 Polyp of corpus uteri: Secondary | ICD-10-CM | POA: Insufficient documentation

## 2015-02-18 DIAGNOSIS — J45909 Unspecified asthma, uncomplicated: Secondary | ICD-10-CM | POA: Diagnosis not present

## 2015-02-18 DIAGNOSIS — Z9851 Tubal ligation status: Secondary | ICD-10-CM | POA: Diagnosis not present

## 2015-02-18 DIAGNOSIS — M13862 Other specified arthritis, left knee: Secondary | ICD-10-CM | POA: Diagnosis not present

## 2015-02-18 DIAGNOSIS — N841 Polyp of cervix uteri: Secondary | ICD-10-CM | POA: Diagnosis not present

## 2015-02-18 DIAGNOSIS — Z87891 Personal history of nicotine dependence: Secondary | ICD-10-CM | POA: Insufficient documentation

## 2015-02-18 DIAGNOSIS — M13861 Other specified arthritis, right knee: Secondary | ICD-10-CM | POA: Diagnosis not present

## 2015-02-18 DIAGNOSIS — Z6839 Body mass index (BMI) 39.0-39.9, adult: Secondary | ICD-10-CM | POA: Diagnosis not present

## 2015-02-18 DIAGNOSIS — N938 Other specified abnormal uterine and vaginal bleeding: Secondary | ICD-10-CM | POA: Diagnosis present

## 2015-02-18 HISTORY — PX: HYSTEROSCOPY WITH D & C: SHX1775

## 2015-02-18 LAB — PREGNANCY, URINE: Preg Test, Ur: NEGATIVE

## 2015-02-18 SURGERY — DILATATION AND CURETTAGE /HYSTEROSCOPY
Anesthesia: General | Site: Vagina

## 2015-02-18 MED ORDER — DOXYCYCLINE HYCLATE 100 MG IV SOLR
200.0000 mg | INTRAVENOUS | Status: AC
  Administered 2015-02-18: 200 mg via INTRAVENOUS
  Filled 2015-02-18: qty 200

## 2015-02-18 MED ORDER — DEXAMETHASONE SODIUM PHOSPHATE 4 MG/ML IJ SOLN
INTRAMUSCULAR | Status: AC
  Filled 2015-02-18: qty 1

## 2015-02-18 MED ORDER — ONDANSETRON HCL 4 MG/2ML IJ SOLN
INTRAMUSCULAR | Status: DC | PRN
  Administered 2015-02-18: 4 mg via INTRAVENOUS

## 2015-02-18 MED ORDER — LACTATED RINGERS IV SOLN
INTRAVENOUS | Status: DC
  Administered 2015-02-18 (×2): via INTRAVENOUS

## 2015-02-18 MED ORDER — LIDOCAINE HCL (CARDIAC) 20 MG/ML IV SOLN
INTRAVENOUS | Status: AC
  Filled 2015-02-18: qty 5

## 2015-02-18 MED ORDER — BUPIVACAINE HCL (PF) 0.5 % IJ SOLN
INTRAMUSCULAR | Status: DC | PRN
  Administered 2015-02-18: 10 mL

## 2015-02-18 MED ORDER — OXYCODONE-ACETAMINOPHEN 5-325 MG PO TABS: 1.0000 | ORAL_TABLET | Freq: Four times a day (QID) | ORAL | Status: DC | PRN

## 2015-02-18 MED ORDER — KETOROLAC TROMETHAMINE 30 MG/ML IJ SOLN
INTRAMUSCULAR | Status: AC
  Filled 2015-02-18: qty 1

## 2015-02-18 MED ORDER — FENTANYL CITRATE (PF) 100 MCG/2ML IJ SOLN
INTRAMUSCULAR | Status: AC
  Filled 2015-02-18: qty 2

## 2015-02-18 MED ORDER — FENTANYL CITRATE (PF) 100 MCG/2ML IJ SOLN
INTRAMUSCULAR | Status: DC | PRN
  Administered 2015-02-18: 50 ug via INTRAVENOUS
  Administered 2015-02-18: 100 ug via INTRAVENOUS

## 2015-02-18 MED ORDER — PROPOFOL 10 MG/ML IV BOLUS
INTRAVENOUS | Status: AC
  Filled 2015-02-18: qty 20

## 2015-02-18 MED ORDER — DEXAMETHASONE SODIUM PHOSPHATE 10 MG/ML IJ SOLN
INTRAMUSCULAR | Status: DC | PRN
  Administered 2015-02-18: 4 mg via INTRAVENOUS

## 2015-02-18 MED ORDER — KETOROLAC TROMETHAMINE 30 MG/ML IJ SOLN
INTRAMUSCULAR | Status: DC | PRN
  Administered 2015-02-18: 30 mg via INTRAVENOUS

## 2015-02-18 MED ORDER — FENTANYL CITRATE (PF) 100 MCG/2ML IJ SOLN: 25.0000 ug | INTRAMUSCULAR | Status: DC | PRN

## 2015-02-18 MED ORDER — PROPOFOL 10 MG/ML IV BOLUS
INTRAVENOUS | Status: DC | PRN
  Administered 2015-02-18: 200 mg via INTRAVENOUS

## 2015-02-18 MED ORDER — LIDOCAINE HCL (CARDIAC) 20 MG/ML IV SOLN: 100.0000 mg | Freq: Once | INTRAVENOUS | Status: DC

## 2015-02-18 MED ORDER — MIDAZOLAM HCL 2 MG/2ML IJ SOLN
INTRAMUSCULAR | Status: AC
  Filled 2015-02-18: qty 2

## 2015-02-18 MED ORDER — LIDOCAINE HCL (CARDIAC) 20 MG/ML IV SOLN
INTRAVENOUS | Status: AC
  Administered 2015-02-18: 100 mg via INTRAVENOUS
  Filled 2015-02-18: qty 5

## 2015-02-18 MED ORDER — ONDANSETRON HCL 4 MG/2ML IJ SOLN: 4.0000 mg | Freq: Once | INTRAMUSCULAR | Status: DC | PRN

## 2015-02-18 MED ORDER — ONDANSETRON HCL 4 MG/2ML IJ SOLN
INTRAMUSCULAR | Status: AC
  Filled 2015-02-18: qty 2

## 2015-02-18 MED ORDER — MIDAZOLAM HCL 2 MG/2ML IJ SOLN
INTRAMUSCULAR | Status: DC | PRN
  Administered 2015-02-18: 2 mg via INTRAVENOUS

## 2015-02-18 MED ORDER — GLYCINE 1.5 % IR SOLN
Status: DC | PRN
  Administered 2015-02-18: 3000 mL

## 2015-02-18 MED ORDER — BUPIVACAINE HCL (PF) 0.5 % IJ SOLN
INTRAMUSCULAR | Status: AC
  Filled 2015-02-18: qty 30

## 2015-02-18 MED ORDER — LIDOCAINE HCL (CARDIAC) 20 MG/ML IV SOLN
INTRAVENOUS | Status: DC | PRN
  Administered 2015-02-18: 80 mg via INTRAVENOUS

## 2015-02-18 SURGICAL SUPPLY — 16 items
CANISTER SUCT 3000ML (MISCELLANEOUS) ×2 IMPLANT
CATH ROBINSON RED A/P 16FR (CATHETERS) ×2 IMPLANT
CLOTH BEACON ORANGE TIMEOUT ST (SAFETY) ×2 IMPLANT
CONTAINER PREFILL 10% NBF 60ML (FORM) ×4 IMPLANT
DILATOR CANAL MILEX (MISCELLANEOUS) IMPLANT
ELECT REM PT RETURN 9FT ADLT (ELECTROSURGICAL)
ELECTRODE REM PT RTRN 9FT ADLT (ELECTROSURGICAL) IMPLANT
GLOVE BIO SURGEON STRL SZ 6.5 (GLOVE) ×2 IMPLANT
GOWN STRL REUS W/TWL LRG LVL3 (GOWN DISPOSABLE) ×4 IMPLANT
LOOP ANGLED CUTTING 22FR (CUTTING LOOP) IMPLANT
PACK VAGINAL MINOR WOMEN LF (CUSTOM PROCEDURE TRAY) ×2 IMPLANT
PAD OB MATERNITY 4.3X12.25 (PERSONAL CARE ITEMS) ×2 IMPLANT
TOWEL OR 17X24 6PK STRL BLUE (TOWEL DISPOSABLE) ×4 IMPLANT
TUBING AQUILEX INFLOW (TUBING) ×2 IMPLANT
TUBING AQUILEX OUTFLOW (TUBING) ×2 IMPLANT
WATER STERILE IRR 1000ML POUR (IV SOLUTION) ×2 IMPLANT

## 2015-02-18 NOTE — Discharge Instructions (Signed)
Dilation and Curettage or Vacuum Curettage, Care After  Refer to this sheet in the next few weeks. These instructions provide you with information on caring for yourself after your procedure. Your health care provider may also give you more specific instructions. Your treatment has been planned according to current medical practices, but problems sometimes occur. Call your health care provider if you have any problems or questions after your procedure.  WHAT TO EXPECT AFTER THE PROCEDURE  After your procedure, it is typical to have light cramping and bleeding. This may last for 2 days to 2 weeks after the procedure.  HOME CARE INSTRUCTIONS   · Do not drive for 24 hours.  · Wait 1 week before returning to strenuous activities.  · Take your temperature 2 times a day for 4 days and write it down. Provide these temperatures to your health care provider if you develop a fever.  · Avoid long periods of standing.  · Avoid heavy lifting, pushing, or pulling. Do not lift anything heavier than 10 pounds (4.5 kg).  · Limit stair climbing to once or twice a day.  · Take rest periods often.  · You may resume your usual diet.  · Drink enough fluids to keep your urine clear or pale yellow.  · Your usual bowel function should return. If you have constipation, you may:  ¨ Take a mild laxative with permission from your health care provider.  ¨ Add fruit and bran to your diet.  ¨ Drink more fluids.  · Take showers instead of baths until your health care provider gives you permission to take baths.  · Do not go swimming or use a hot tub until your health care provider approves.  · Try to have someone with you or available to you the first 24-48 hours, especially if you were given a general anesthetic.  · Do not douche, use tampons, or have sex (intercourse) for 2 weeks after the procedure.  · Only take over-the-counter or prescription medicines as directed by your health care provider. Do not take aspirin. It can cause  bleeding.  · Follow up with your health care provider as directed.  SEEK MEDICAL CARE IF:   · You have increasing cramps or pain that is not relieved with medicine.  · You have abdominal pain that does not seem to be related to the same area of earlier cramping and pain.  · You have bad smelling vaginal discharge.  · You have a rash.  · You are having problems with any medicine.  SEEK IMMEDIATE MEDICAL CARE IF:   · You have bleeding that is heavier than a normal menstrual period.  · You have a fever.  · You have chest pain.  · You have shortness of breath.  · You feel dizzy or feel like fainting.  · You pass out.  · You have pain in your shoulder strap area.  · You have heavy vaginal bleeding with or without blood clots.  MAKE SURE YOU:   · Understand these instructions.  · Will watch your condition.  · Will get help right away if you are not doing well or get worse.  Document Released: 07/13/2000 Document Revised: 07/21/2013 Document Reviewed: 02/12/2013  [employer]® Patient Information ©2015 [employer]. This information is not intended to replace advice given to you by your health care provider. Make sure you discuss any questions you have with your health care provider.

## 2015-02-18 NOTE — Anesthesia Procedure Notes (Signed)
Procedure Name: LMA Insertion Date/Time: 02/18/2015 1:51 PM Performed by: Terasa Orsini, Jannet Askew Pre-anesthesia Checklist: Patient identified, Timeout performed, Emergency Drugs available, Suction available and Patient being monitored Patient Re-evaluated:Patient Re-evaluated prior to inductionOxygen Delivery Method: Circle system utilized Preoxygenation: Pre-oxygenation with 100% oxygen Intubation Type: IV induction LMA: LMA inserted LMA Size: 4.0 Number of attempts: 1

## 2015-02-18 NOTE — Anesthesia Postprocedure Evaluation (Signed)
Anesthesia Post Note  Patient: Wendy James  Procedure(s) Performed: Procedure(s) (LRB): DILATATION AND CURETTAGE /HYSTEROSCOPY (N/A)  Anesthesia type: General  Patient location: PACU  Post pain: Pain level controlled  Post assessment: Post-op Vital signs reviewed  Last Vitals:  Filed Vitals:   02/18/15 1223  BP: 128/85  Pulse: 76  Temp: 36.9 C  Resp: 20    Post vital signs: Reviewed  Level of consciousness: sedated  Complications: No apparent anesthesia complications

## 2015-02-18 NOTE — Transfer of Care (Signed)
Immediate Anesthesia Transfer of Care Note  Patient: Wendy James  Procedure(s) Performed: Procedure(s): DILATATION AND CURETTAGE /HYSTEROSCOPY (N/A)  Patient Location: PACU  Anesthesia Type:General  Level of Consciousness: awake, alert  and oriented  Airway & Oxygen Therapy: Patient Spontanous Breathing and Patient connected to nasal cannula oxygen  Post-op Assessment: Report given to RN and Post -op Vital signs reviewed and stable  Post vital signs: Reviewed and stable  Last Vitals:  Filed Vitals:   02/18/15 1223  BP: 128/85  Pulse: 76  Temp: 36.9 C  Resp: 20    Complications: No apparent anesthesia complications

## 2015-02-18 NOTE — H&P (Signed)
Wendy James is an 44 y.o. female MAA P4 (h/o 4 cesarean sections) here today for h/s, d&c due to her heavy and frequent periods. She has had 2 periods/month for the last few months. Her hbg was 11.4.   Patient's last menstrual period was 01/04/2015.    Past Medical History  Diagnosis Date  . Asthma   . Seasonal allergies   . 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 Cholecystectomy    . Cesarean section      x 4  . Tubal ligation      Family History  Problem Relation Age of Onset  . Hypertension Mother   . Stroke Sister   . Seizures Sister     Social History:  reports that she quit smoking about 26 years ago. Her smoking use included Cigarettes. She smoked 0.75 packs per day. She has never used smokeless tobacco. She reports that she does not drink alcohol or use illicit drugs.  Allergies:  Allergies  Allergen Reactions  . Shrimp [Shellfish Allergy] Other (See Comments)    Wheezing.  Patient states she is ok with betadine    Prescriptions prior to admission  Medication Sig Dispense Refill Last Dose  . albuterol (PROVENTIL HFA;VENTOLIN HFA) 108 (90 BASE) MCG/ACT inhaler Inhale 2 puffs into the lungs every 6 (six) hours as needed for wheezing or shortness of breath. 1 Inhaler 2 02/18/2015 at Unknown time  . albuterol (PROVENTIL) (2.5 MG/3ML) 0.083% nebulizer solution Take 3 mLs (2.5 mg total) by nebulization every 6 (six) hours as needed for wheezing or shortness of breath. 75 mL 0 02/18/2015 at Unknown time  . guaiFENesin (ROBITUSSIN) 100 MG/5ML liquid Take 5-10 mLs (100-200 mg total) by mouth every 4 (four) hours as needed for cough. 60 mL 0 Past Week at Unknown time  . ibuprofen (ADVIL,MOTRIN) 200 MG tablet Take 400 mg by mouth every 6 (six) hours as needed for cramping.   Past Week at Unknown time  . albuterol (PROVENTIL) (2.5 MG/3ML) 0.083% nebulizer solution Take 3 mLs (2.5 mg total) by nebulization every 4 (four) hours  as needed for wheezing or shortness of breath. (Patient not taking: Reported on 02/04/2015) 30 vial 0   . ibuprofen (ADVIL,MOTRIN) 800 MG tablet Take 800 mg every 8 hours with food for 5 days (Patient not taking: Reported on 12/07/2014) 30 tablet 0 Completed Course at Unknown time  . metroNIDAZOLE (FLAGYL) 500 MG tablet Take 4 tablets (2,000 mg total) by mouth once. (Patient not taking: Reported on 12/07/2014) 14 tablet 0 Completed Course at Unknown time  . metroNIDAZOLE (FLAGYL) 500 MG tablet Take two tablets by mouth twice a day, for one day.  Or you can take all four tablets at once if you can tolerate it. (Patient not taking: Reported on 02/04/2015) 8 tablet 0   . misoprostol (CYTOTEC) 200 MCG tablet Take 3 pills by mouth the night before biopsy. (Patient not taking: Reported on 12/07/2014) 3 tablet 0 Completed Course at Unknown time  . oxymetazoline (AFRIN NASAL SPRAY) 0.05 % nasal spray Place 1 spray into both nostrils 2 (two) times daily. (Patient not taking: Reported on 02/04/2015) 30 mL 0 Not Taking at Unknown time  . predniSONE (DELTASONE) 20 MG tablet Take 2 tablets (40 mg total) by mouth daily. (Patient not taking: Reported on 02/04/2015) 10 tablet 0   . predniSONE (DELTASONE) 50 MG tablet Take 1 tablet (50 mg total) by mouth  daily. (Patient not taking: Reported on 02/04/2015) 5 tablet 0 Not Taking at Unknown time    ROS  Blood pressure 128/85, pulse 76, temperature 98.4 F (36.9 C), temperature source Oral, resp. rate 20, height 5\' 1"  (1.549 m), weight 207 lb (93.895 kg), last menstrual period 01/04/2015, SpO2 99 %. Physical Exam Heart- rrr Lungs- CTAB Abd- benign Results for orders placed or performed during the hospital encounter of 02/18/15 (from the past 24 hour(s))  Pregnancy, urine     Status: None   Collection Time: 02/18/15 12:15 PM  Result Value Ref Range   Preg Test, Ur NEGATIVE NEGATIVE    No results found.  Assessment/Plan: DUB- possible polyp seen on u/s Plan for d&c,  hysteroscopy  She understands the risks of surgery, including, but not to infection, bleeding, DVTs, damage to bowel, bladder, ureters. She wishes to proceed.     Zyliah Schier C. 02/18/2015, 1:38 PM

## 2015-02-18 NOTE — Op Note (Signed)
02/18/2015  2:32 PM  PATIENT:  Wendy James  44 y.o. female  PRE-OPERATIVE DIAGNOSIS:  cpt [phone] - DUB/severe dysmenorrhea  POST-OPERATIVE DIAGNOSIS:  cpt [id number] - DUB/severe dysmenorrhea  PROCEDURE:  Procedure(s): DILATATION AND CURETTAGE /HYSTEROSCOPY (N/A)  RESECTION OF UTERINE POLYPS  SURGEON:  Surgeon(s) and Role:    * Allie Bossier, MD - Primary   ANESTHESIA:   general  EBL:  Total I/O In: 1000 [I.V.:1000] Out: 350 [Urine:350]  BLOOD ADMINISTERED:none  DRAINS: none   LOCAL MEDICATIONS USED:  MARCAINE     SPECIMEN:  Source of Specimen:  uterine curettings and polyps  DISPOSITION OF SPECIMEN:  PATHOLOGY  COUNTS:  YES  TOURNIQUET:  * No tourniquets in log *  DICTATION: .Dragon Dictation  PLAN OF CARE: Discharge to home after PACU  PATIENT DISPOSITION:  PACU - hemodynamically stable.   Delay start of Pharmacological VTE agent (>24hrs) due to surgical blood loss or risk of bleeding: not applicable    The risks, benefits, and alternatives of surgery were explained, understood, and accepted. All questions were answered. Consents were signed. In the operating room general anesthesia was applied without complication, and she was placed in the dorsal lithotomy position. Her vagina was prepped and draped in the usual sterile fashion. A Robinson catheter was used to drain her bladder. A bimanual exam revealed a 6 week size , anteverted mobile uterus. Her adnexa were nonenlarged. A speculum was placed and a single-tooth tenaculum was used to grasp the anterior lip of her cervix. A total of 10 mL of 0.5% Marcaine was used to perform a paracervical block. Her uterus sounded to 9 cm. Her cervix was carefully and slowly dilated to accommodate a hysteroscope. Hysteroscopy revealed several fundal polyps.  A curettage was done in all quadrants and the fundus of the uterus. I looked again with the hysteroscope and saw that some polyps were still there. I used the resectoscope to  remove the remaining polyps. Hemostasis was noted.   A gritty sensation was appreciated throughout. There was no bleeding noted at the end of the case. She was taken to the recovery room after being extubated. She tolerated the procedure well.

## 2015-02-21 ENCOUNTER — Encounter (HOSPITAL_COMMUNITY): Payer: Self-pay | Admitting: Obstetrics & Gynecology

## 2015-03-24 ENCOUNTER — Ambulatory Visit: Payer: 59 | Admitting: Obstetrics & Gynecology

## 2015-03-24 ENCOUNTER — Encounter: Payer: Self-pay | Admitting: Obstetrics & Gynecology

## 2015-03-24 VITALS — BP 144/84 | HR 76 | Temp 99.1°F | Ht 62.0 in | Wt 212.0 lb

## 2015-03-24 DIAGNOSIS — N920 Excessive and frequent menstruation with regular cycle: Secondary | ICD-10-CM

## 2015-03-24 DIAGNOSIS — Z9889 Other specified postprocedural states: Secondary | ICD-10-CM

## 2015-03-24 MED ORDER — MISOPROSTOL 200 MCG PO TABS
ORAL_TABLET | ORAL | Status: DC
Start: 2015-03-24 — End: 2015-05-03

## 2015-03-24 NOTE — Progress Notes (Signed)
   Subjective:    Patient ID: Wendy James, female    DOB: [birth year], 44 y.o.   MRN: [id number]  HPI  44 yo MAA lady is here for her post op visit after a d&c and h/s. Polyps were removed. She is doing well, but still had a heavy period.  Review of Systems S/P BTL Her husband is having back surgery tomorrow.    Objective:   Physical Exam [id number] Breathing, conversing, and ambulating normally       Assessment & Plan:  Menorrhagia with regular cycle- We discussed using a Mirena instead of a hysterectomy and she is amenable to this. She will pretreat with cytotec.

## 2015-03-24 NOTE — Progress Notes (Signed)
Pt reports spotting since July 22nd just stopped yesterday.  LMP 03/04/15 pt states bleeding was heavier.

## 2015-04-25 ENCOUNTER — Ambulatory Visit: Payer: 59 | Admitting: Obstetrics & Gynecology

## 2015-04-27 ENCOUNTER — Encounter (HOSPITAL_COMMUNITY): Payer: Self-pay

## 2015-04-27 ENCOUNTER — Inpatient Hospital Stay (HOSPITAL_COMMUNITY)
Admission: EM | Admit: 2015-04-27 | Discharge: 2015-05-03 | DRG: 202 | Disposition: A | Discharge status: Home / Self Care | Payer: 59 | Attending: Internal Medicine | Admitting: Internal Medicine

## 2015-04-27 ENCOUNTER — Emergency Department (HOSPITAL_COMMUNITY): Payer: 59

## 2015-04-27 DIAGNOSIS — J9600 Acute respiratory failure, unspecified whether with hypoxia or hypercapnia: Secondary | ICD-10-CM | POA: Diagnosis present

## 2015-04-27 DIAGNOSIS — J45901 Unspecified asthma with (acute) exacerbation: Principal | ICD-10-CM | POA: Diagnosis present

## 2015-04-27 DIAGNOSIS — J9601 Acute respiratory failure with hypoxia: Secondary | ICD-10-CM | POA: Diagnosis not present

## 2015-04-27 DIAGNOSIS — Z8249 Family history of ischemic heart disease and other diseases of the circulatory system: Secondary | ICD-10-CM

## 2015-04-27 DIAGNOSIS — D72829 Elevated white blood cell count, unspecified: Secondary | ICD-10-CM | POA: Diagnosis not present

## 2015-04-27 DIAGNOSIS — Z6839 Body mass index (BMI) 39.0-39.9, adult: Secondary | ICD-10-CM

## 2015-04-27 DIAGNOSIS — I10 Essential (primary) hypertension: Secondary | ICD-10-CM | POA: Diagnosis present

## 2015-04-27 DIAGNOSIS — T380X5A Adverse effect of glucocorticoids and synthetic analogues, initial encounter: Secondary | ICD-10-CM | POA: Diagnosis present

## 2015-04-27 DIAGNOSIS — R0602 Shortness of breath: Secondary | ICD-10-CM | POA: Diagnosis not present

## 2015-04-27 DIAGNOSIS — M1990 Unspecified osteoarthritis, unspecified site: Secondary | ICD-10-CM | POA: Diagnosis present

## 2015-04-27 DIAGNOSIS — J209 Acute bronchitis, unspecified: Secondary | ICD-10-CM | POA: Diagnosis present

## 2015-04-27 DIAGNOSIS — R739 Hyperglycemia, unspecified: Secondary | ICD-10-CM | POA: Diagnosis present

## 2015-04-27 DIAGNOSIS — D638 Anemia in other chronic diseases classified elsewhere: Secondary | ICD-10-CM | POA: Diagnosis present

## 2015-04-27 DIAGNOSIS — E669 Obesity, unspecified: Secondary | ICD-10-CM | POA: Diagnosis present

## 2015-04-27 DIAGNOSIS — J302 Other seasonal allergic rhinitis: Secondary | ICD-10-CM

## 2015-04-27 DIAGNOSIS — D649 Anemia, unspecified: Secondary | ICD-10-CM | POA: Diagnosis present

## 2015-04-27 DIAGNOSIS — Z87891 Personal history of nicotine dependence: Secondary | ICD-10-CM

## 2015-04-27 DIAGNOSIS — R030 Elevated blood-pressure reading, without diagnosis of hypertension: Secondary | ICD-10-CM | POA: Diagnosis not present

## 2015-04-27 LAB — CBC WITH DIFFERENTIAL/PLATELET
Basophils Absolute: 0 10*3/uL (ref 0.0–0.1)
Basophils Relative: 0 %
Eosinophils Absolute: 0.1 10*3/uL (ref 0.0–0.7)
Eosinophils Relative: 2 %
HCT: 35.9 % — ABNORMAL LOW (ref 36.0–46.0)
Hemoglobin: 11.8 g/dL — ABNORMAL LOW (ref 12.0–15.0)
Lymphocytes Relative: 52 %
Lymphs Abs: 3.8 10*3/uL (ref 0.7–4.0)
MCH: 29.1 pg (ref 26.0–34.0)
MCHC: 32.9 g/dL (ref 30.0–36.0)
MCV: 88.6 fL (ref 78.0–100.0)
Monocytes Absolute: 0.6 10*3/uL (ref 0.1–1.0)
Monocytes Relative: 8 %
Neutro Abs: 2.8 10*3/uL (ref 1.7–7.7)
Neutrophils Relative %: 38 %
Platelets: 204 10*3/uL (ref 150–400)
RBC: 4.05 MIL/uL (ref 3.87–5.11)
RDW: 14.4 % (ref 11.5–15.5)
WBC: 7.3 10*3/uL (ref 4.0–10.5)

## 2015-04-27 LAB — BASIC METABOLIC PANEL
Anion gap: 9 (ref 5–15)
BUN: 18 mg/dL (ref 6–20)
CO2: 25 mmol/L (ref 22–32)
Calcium: 8.8 mg/dL — ABNORMAL LOW (ref 8.9–10.3)
Chloride: 108 mmol/L (ref 101–111)
Creatinine, Ser: 0.67 mg/dL (ref 0.44–1.00)
GFR calc Af Amer: >60 mL/min (ref >60)
GFR calc non Af Amer: >60 mL/min (ref >60)
Glucose, Bld: 101 mg/dL — ABNORMAL HIGH (ref 65–99)
Potassium: 3.9 mmol/L (ref 3.5–5.1)
Sodium: 142 mmol/L (ref 135–145)

## 2015-04-27 LAB — HCG, QUANTITATIVE, PREGNANCY

## 2015-04-27 MED ORDER — DEXAMETHASONE SODIUM PHOSPHATE 10 MG/ML IJ SOLN
10.0000 mg | Freq: Once | INTRAMUSCULAR | Status: AC
  Administered 2015-04-27: 10 mg via INTRAVENOUS
  Filled 2015-04-27: qty 1

## 2015-04-27 MED ORDER — ACETAMINOPHEN 325 MG PO TABS
650.0000 mg | ORAL_TABLET | Freq: Four times a day (QID) | ORAL | Status: DC | PRN
  Administered 2015-05-01 – 2015-05-02 (×2): 650 mg via ORAL
  Filled 2015-04-27 (×2): qty 2

## 2015-04-27 MED ORDER — IPRATROPIUM BROMIDE 0.02 % IN SOLN
0.5000 mg | Freq: Once | RESPIRATORY_TRACT | Status: AC
  Administered 2015-04-27: 0.5 mg via RESPIRATORY_TRACT
  Filled 2015-04-27: qty 2.5

## 2015-04-27 MED ORDER — ALBUTEROL (5 MG/ML) CONTINUOUS INHALATION SOLN
10.0000 mg/h | INHALATION_SOLUTION | RESPIRATORY_TRACT | Status: DC
  Administered 2015-04-27: 10 mg/h via RESPIRATORY_TRACT

## 2015-04-27 MED ORDER — AZITHROMYCIN 250 MG PO TABS
250.0000 mg | ORAL_TABLET | Freq: Every day | ORAL | Status: AC
  Administered 2015-04-29 – 2015-05-02 (×4): 250 mg via ORAL
  Filled 2015-04-27 (×4): qty 1

## 2015-04-27 MED ORDER — ZOLPIDEM TARTRATE 5 MG PO TABS
5.0000 mg | ORAL_TABLET | Freq: Every evening | ORAL | Status: DC | PRN
  Administered 2015-04-28 – 2015-05-03 (×7): 5 mg via ORAL
  Filled 2015-04-27 (×7): qty 1

## 2015-04-27 MED ORDER — DM-GUAIFENESIN ER 30-600 MG PO TB12
1.0000 | ORAL_TABLET | Freq: Two times a day (BID) | ORAL | Status: DC
  Administered 2015-04-28 – 2015-05-03 (×12): 1 via ORAL
  Filled 2015-04-27 (×12): qty 1

## 2015-04-27 MED ORDER — ALBUTEROL (5 MG/ML) CONTINUOUS INHALATION SOLN
10.0000 mg/h | INHALATION_SOLUTION | RESPIRATORY_TRACT | Status: DC
  Administered 2015-04-27: 10 mg/h via RESPIRATORY_TRACT
  Filled 2015-04-27: qty 20

## 2015-04-27 MED ORDER — ENOXAPARIN SODIUM 40 MG/0.4ML ~~LOC~~ SOLN
40.0000 mg | Freq: Every day | SUBCUTANEOUS | Status: DC
  Administered 2015-04-28 – 2015-05-02 (×6): 40 mg via SUBCUTANEOUS
  Filled 2015-04-27 (×6): qty 0.4

## 2015-04-27 MED ORDER — IBUPROFEN 200 MG PO TABS
400.0000 mg | ORAL_TABLET | Freq: Four times a day (QID) | ORAL | Status: DC | PRN
  Administered 2015-04-28 (×2): 400 mg via ORAL
  Filled 2015-04-27 (×2): qty 2

## 2015-04-27 MED ORDER — AZITHROMYCIN 500 MG PO TABS
500.0000 mg | ORAL_TABLET | Freq: Every day | ORAL | Status: AC
  Administered 2015-04-28: 500 mg via ORAL
  Filled 2015-04-27: qty 1

## 2015-04-27 MED ORDER — IPRATROPIUM-ALBUTEROL 0.5-2.5 (3) MG/3ML IN SOLN
3.0000 mL | RESPIRATORY_TRACT | Status: DC
  Administered 2015-04-28: 3 mL via RESPIRATORY_TRACT
  Filled 2015-04-27: qty 3

## 2015-04-27 MED ORDER — ALBUTEROL SULFATE (2.5 MG/3ML) 0.083% IN NEBU: 2.5000 mg | INHALATION_SOLUTION | RESPIRATORY_TRACT | Status: DC | PRN

## 2015-04-27 MED ORDER — HYDRALAZINE HCL 20 MG/ML IJ SOLN
5.0000 mg | INTRAMUSCULAR | Status: DC | PRN
  Administered 2015-05-02: 5 mg via INTRAVENOUS
  Filled 2015-04-27: qty 1

## 2015-04-27 MED ORDER — SODIUM CHLORIDE 0.9 % IJ SOLN
3.0000 mL | Freq: Two times a day (BID) | INTRAMUSCULAR | Status: DC
  Administered 2015-04-28 – 2015-05-03 (×12): 3 mL via INTRAVENOUS

## 2015-04-27 MED ORDER — METHYLPREDNISOLONE SODIUM SUCC 125 MG IJ SOLR
60.0000 mg | Freq: Three times a day (TID) | INTRAMUSCULAR | Status: DC
  Administered 2015-04-28 – 2015-04-29 (×5): 60 mg via INTRAVENOUS
  Filled 2015-04-27 (×6): qty 0.96

## 2015-04-27 MED ORDER — ACETAMINOPHEN 650 MG RE SUPP: 650.0000 mg | Freq: Four times a day (QID) | RECTAL | Status: DC | PRN

## 2015-04-27 MED ORDER — OXYMETAZOLINE HCL 0.05 % NA SOLN
1.0000 | Freq: Two times a day (BID) | NASAL | Status: DC
  Administered 2015-04-28 – 2015-05-03 (×12): 1 via NASAL
  Filled 2015-04-27: qty 15

## 2015-04-27 NOTE — ED Notes (Signed)
Pt has asthma.  Started having trouble with wheezing at work.  2 treatments and symptoms not resolving.

## 2015-04-27 NOTE — ED Provider Notes (Signed)
CSN: [id number]     Arrival date & time 04/27/15  1547 History   First MD Initiated Contact with Patient 04/27/15 1726     Chief Complaint  Patient presents with  . Asthma  . Shortness of Breath     (Consider location/radiation/quality/duration/timing/severity/associated sxs/prior Treatment) HPI Patient presents to the emergency department with an exacerbation of her asthma.  The patient states she was at home over the last 2 days with increasing shortness of breath, wheezing and cough.  Patient states she been using her inhaler more than normal and albuterol nebulized treatments.  Patient states she seemed be getting worse.  Patient states that she has had to be admitted but never intubated for her asthma.  Patient denies chest pain, weakness, numbness, dizziness, headache, blurred vision, back pain, neck pain, fever, runny nose, sore throat, dysuria, abdominal pain, incontinence, nausea, vomiting, diarrhea, near syncope or syncope.  The patient states that she did not take any other medications prior to arrival Past Medical History  Diagnosis Date  . Asthma   . Seasonal allergies   . 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 Cholecystectomy    . Cesarean section      x 4  . Tubal ligation    . Hysteroscopy w/d&c N/A 02/18/2015    Procedure: DILATATION AND CURETTAGE /HYSTEROSCOPY;  Surgeon: Allie Bossier, MD;  Location: [facility];  Service: Gynecology;  Laterality: N/A;   Family History  Problem Relation Age of Onset  . Hypertension Mother   . Stroke Sister   . Seizures Sister    Social History  Substance Use Topics  . Smoking status: Former Smoker -- 0.75 packs/day    Types: Cigarettes    Quit date: 11/17/1988  . Smokeless tobacco: Never Used  . Alcohol Use: No   OB History    Gravida Para Term Preterm AB TAB SAB Ectopic Multiple Living   0 1 0 1 0 0 3     Review of Systems  All other systems negative except as documented  in the HPI. All pertinent positives and negatives as reviewed in the HPI.  Allergies  Shrimp  Home Medications   Prior to Admission medications   Medication Sig Start Date End Date Taking? Authorizing Provider  albuterol (PROVENTIL HFA;VENTOLIN HFA) 108 (90 BASE) MCG/ACT inhaler Inhale 2 puffs into the lungs every 6 (six) hours as needed for wheezing or shortness of breath. 01/23/15  Yes Jennifer Piepenbrink, PA-C  albuterol (PROVENTIL) (2.5 MG/3ML) 0.083% nebulizer solution Take 3 mLs (2.5 mg total) by nebulization every 6 (six) hours as needed for wheezing or shortness of breath. 01/23/15  Yes Jennifer Piepenbrink, PA-C  albuterol (PROVENTIL) (2.5 MG/3ML) 0.083% nebulizer solution Take 3 mLs (2.5 mg total) by nebulization every 4 (four) hours as needed for wheezing or shortness of breath. Patient not taking: Reported on 02/04/2015 12/07/14   Dione Booze, MD  guaiFENesin (ROBITUSSIN) 100 MG/5ML liquid Take 5-10 mLs (100-200 mg total) by mouth every 4 (four) hours as needed for cough. Patient not taking: Reported on 03/24/2015 01/23/15   Francee Piccolo, PA-C  ibuprofen (ADVIL,MOTRIN) 200 MG tablet Take 400 mg by mouth every 6 (six) hours as needed for cramping.    Historical Provider, MD  ibuprofen (ADVIL,MOTRIN) 800 MG tablet Take 800 mg every 8 hours with food for 5 days Patient not taking: Reported on 12/07/2014 11/04/14   Leone Brand, NP  metroNIDAZOLE (FLAGYL) 500 MG tablet Take 4 tablets (2,000 mg total) by mouth once. Patient not taking: Reported on 12/07/2014 11/24/14   Allie Bossier, MD  misoprostol (CYTOTEC) 200 MCG tablet Take 3 pills by mouth the night before IUD. Patient not taking: Reported on 04/27/2015 03/24/15   Allie Bossier, MD  oxyCODONE-acetaminophen (PERCOCET/ROXICET) 5-325 MG per tablet Take 1-2 tablets by mouth every 6 (six) hours as needed. Patient not taking: Reported on 03/24/2015 02/18/15   Allie Bossier, MD  oxymetazoline (AFRIN NASAL SPRAY) 0.05 % nasal spray Place 1 spray  into both nostrils 2 (two) times daily. Patient not taking: Reported on 02/04/2015 01/23/15   Francee Piccolo, PA-C  predniSONE (DELTASONE) 20 MG tablet Take 2 tablets (40 mg total) by mouth daily. Patient not taking: Reported on 02/04/2015 01/23/15   Francee Piccolo, PA-C  predniSONE (DELTASONE) 50 MG tablet Take 1 tablet (50 mg total) by mouth daily. Patient not taking: Reported on 02/04/2015 12/07/14   Dione Booze, MD   BP 129/64 mmHg  Pulse 91  Temp(Src) 99.5 F (37.5 C) (Oral)  Resp 20  SpO2 98%  LMP 04/06/2015 Physical Exam  Constitutional: She is oriented to person, place, and time. She appears well-developed and well-nourished. No distress.  HENT:  Head: Normocephalic and atraumatic.  Mouth/Throat: Oropharynx is clear and moist.  Eyes: Pupils are equal, round, and reactive to light.  Neck: Normal range of motion. Neck supple.  Cardiovascular: Normal rate, regular rhythm and normal heart sounds.  Exam reveals no gallop and no friction rub.   No murmur heard. Pulmonary/Chest: Effort normal. No respiratory distress. She has wheezes.  Abdominal: Soft. Bowel sounds are normal. She exhibits no distension. There is no tenderness.  Neurological: She is alert and oriented to person, place, and time. She exhibits normal muscle tone. Coordination normal.  Skin: Skin is warm and dry. No rash noted. No erythema.  Psychiatric: She has a normal mood and affect. Her behavior is normal.  Nursing note and vitals reviewed.   ED Course  Procedures (including critical care time) Labs Review Labs Reviewed  BASIC METABOLIC PANEL - Abnormal; Notable for the following:    Glucose, Bld 101 (*)    Calcium 8.8 (*)    All other components within normal limits  CBC WITH DIFFERENTIAL/PLATELET - Abnormal; Notable for the following:    Hemoglobin 11.8 (*)    HCT 35.9 (*)    All other components within normal limits  COMPREHENSIVE METABOLIC PANEL - Abnormal; Notable for the following:    Glucose,  Bld 212 (*)    Calcium 8.7 (*)    All other components within normal limits  CBC - Abnormal; Notable for the following:    Hemoglobin 11.9 (*)    All other components within normal limits  CBC - Abnormal; Notable for the following:    WBC 17.9 (*)    Hemoglobin 11.7 (*)    All other components within normal limits  BASIC METABOLIC PANEL - Abnormal; Notable for the following:    Glucose, Bld 156 (*)    All other components within normal limits  CBC - Abnormal; Notable for the following:    WBC 17.0 (*)    All other components within normal limits  BASIC METABOLIC PANEL - Abnormal; Notable for the following:    Glucose, Bld 156 (*)    All other components within normal limits  CBC - Abnormal; Notable for the following:    WBC 13.4 (*)    Hemoglobin  11.8 (*)    All other components within normal limits  BASIC METABOLIC PANEL - Abnormal; Notable for the following:    Glucose, Bld 154 (*)    Calcium 8.7 (*)    All other components within normal limits  CULTURE, EXPECTORATED SPUTUM-ASSESSMENT  CULTURE, BLOOD (ROUTINE X 2)  CULTURE, BLOOD (ROUTINE X 2)  CULTURE, EXPECTORATED SPUTUM-ASSESSMENT  GRAM STAIN  INFLUENZA PANEL BY PCR (TYPE A & B, H1N1)(NOT AT [facility])  HCG, QUANTITATIVE, PREGNANCY  URINE RAPID DRUG SCREEN, HOSP PERFORMED  HIV ANTIBODY (ROUTINE TESTING)  STREP PNEUMONIAE URINARY ANTIGEN    Imaging Review Dg Chest 2 View  04/27/2015   CLINICAL DATA:  Mid chest pain and shortness of breath for 2 days  EXAM: CHEST  2 VIEW  COMPARISON:  January 23, 2015  FINDINGS: The heart size and mediastinal contours are within normal limits. There is minimal atelectasis of left lung base. There is no focal pneumonia, pulmonary edema, or pleural effusion. The visualized skeletal structures are stable.  IMPRESSION: Minimal atelectasis of left lung base.  No focal pneumonia.   Electronically Signed   By: Sherian Rein M.D.   On: 04/27/2015 20:01   I have personally reviewed and evaluated these  images and lab results as part of my medical decision-making.   EKG Interpretation None      The patient was observed in the emergency department, given several hour-long neb treatments and did not significantly improved.  She will be admitted to the hospital for further evaluation and further treatments.  The patient agrees the plan and all questions were answered    Charlestine Night, PA-C 05/03/15 1610  Bethann Berkshire, MD 05/03/15 [phone]

## 2015-04-27 NOTE — H&P (Addendum)
[facility] History and Physical  CAPRI VEALS ZOX:[id number] DOB: 11-25-1970 DOA: 04/27/2015  Referring physician: ED physician PCP: No PCP Per Patient  Specialists:   Chief Complaint: Shortness of breath, cough, wheezing  HPI: DASHANNA KINNAMON is a 44 y.o. female with PMH of asthma, arthritis, seasonal allergy, who presents with shortness of breath, cough and wheezing.  Patient reports that she has been having cough, shortness of breath and wheezing in the past 2 days. Her home albuterol inhaler does not help anymore. She coughs up clear mucus. She does not have chest pain, but feels tightness in her chest. She does not have fever, chills. No tenderness over calf area. She does not have abdominal pain, diarrhea, symptoms of UTI, unilateral weakness or rashes.  In ED, patient was found to have WBC 7.3, temperature normal, no tachycardia, electrolytes okay. Chest x-ray showed minimal atelectasis, no infiltration. Patient is admitted to inpatient for the prevention of treatment.  Where does patient live?   At home    Can patient participate in ADLs?  Yes     Review of Systems:   General: no fevers, chills, no changes in body weight, has fatigue HEENT: no blurry vision, hearing changes or sore throat Pulm: has dyspnea, coughing, wheezing CV: no chest pain, palpitations Abd: no nausea, vomiting, abdominal pain, diarrhea, constipation GU: no dysuria, burning on urination, increased urinary frequency, hematuria  Ext: no leg edema Neuro: no unilateral weakness, numbness, or tingling, no vision change or hearing loss Skin: no rash MSK: No muscle spasm, no deformity, no limitation of range of movement in spin Heme: No easy bruising.  Travel history: No recent long distant travel.  Allergy:  Allergies  Allergen Reactions  . Shrimp [Shellfish Allergy] Other (See Comments)    Wheezing.  Patient states she is ok with betadine    Past Medical History  Diagnosis Date  .  Asthma   . Seasonal allergies   . 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 Cholecystectomy    . Cesarean section      x 4  . Tubal ligation    . Hysteroscopy w/d&c N/A 02/18/2015    Procedure: DILATATION AND CURETTAGE /HYSTEROSCOPY;  Surgeon: Allie Bossier, MD;  Location: [facility];  Service: Gynecology;  Laterality: N/A;    Social History:  reports that she quit smoking about 26 years ago. Her smoking use included Cigarettes. She smoked 0.75 packs per day. She has never used smokeless tobacco. She reports that she does not drink alcohol or use illicit drugs.  Family History:  Family History  Problem Relation Age of Onset  . Hypertension Mother   . Stroke Sister   . Seizures Sister      Prior to Admission medications   Medication Sig Start Date End Date Taking? Authorizing Provider  albuterol (PROVENTIL HFA;VENTOLIN HFA) 108 (90 BASE) MCG/ACT inhaler Inhale 2 puffs into the lungs every 6 (six) hours as needed for wheezing or shortness of breath. 01/23/15  Yes Jennifer Piepenbrink, PA-C  albuterol (PROVENTIL) (2.5 MG/3ML) 0.083% nebulizer solution Take 3 mLs (2.5 mg total) by nebulization every 6 (six) hours as needed for wheezing or shortness of breath. 01/23/15  Yes Jennifer Piepenbrink, PA-C  albuterol (PROVENTIL) (2.5 MG/3ML) 0.083% nebulizer solution Take 3 mLs (2.5 mg total) by nebulization every 4 (four) hours as needed for wheezing or shortness of breath. Patient not taking: Reported on 02/04/2015 12/07/14  Dione Booze, MD  guaiFENesin (ROBITUSSIN) 100 MG/5ML liquid Take 5-10 mLs (100-200 mg total) by mouth every 4 (four) hours as needed for cough. Patient not taking: Reported on 03/24/2015 01/23/15   Francee Piccolo, PA-C  ibuprofen (ADVIL,MOTRIN) 200 MG tablet Take 400 mg by mouth every 6 (six) hours as needed for cramping.    Historical Provider, MD  ibuprofen (ADVIL,MOTRIN) 800 MG tablet Take 800 mg every 8 hours with food for 5  days Patient not taking: Reported on 12/07/2014 11/04/14   Leone Brand, NP  metroNIDAZOLE (FLAGYL) 500 MG tablet Take 4 tablets (2,000 mg total) by mouth once. Patient not taking: Reported on 12/07/2014 11/24/14   Allie Bossier, MD  misoprostol (CYTOTEC) 200 MCG tablet Take 3 pills by mouth the night before IUD. Patient not taking: Reported on 04/27/2015 03/24/15   Allie Bossier, MD  oxyCODONE-acetaminophen (PERCOCET/ROXICET) 5-325 MG per tablet Take 1-2 tablets by mouth every 6 (six) hours as needed. Patient not taking: Reported on 03/24/2015 02/18/15   Allie Bossier, MD  oxymetazoline (AFRIN NASAL SPRAY) 0.05 % nasal spray Place 1 spray into both nostrils 2 (two) times daily. Patient not taking: Reported on 02/04/2015 01/23/15   Francee Piccolo, PA-C  predniSONE (DELTASONE) 20 MG tablet Take 2 tablets (40 mg total) by mouth daily. Patient not taking: Reported on 02/04/2015 01/23/15   Francee Piccolo, PA-C  predniSONE (DELTASONE) 50 MG tablet Take 1 tablet (50 mg total) by mouth daily. Patient not taking: Reported on 02/04/2015 12/07/14   Dione Booze, MD    Physical Exam: Filed Vitals:   04/27/15 2334 04/27/15 2346 04/27/15 2347 04/28/15 0032  BP: 185/97 151/88    Pulse: 82     Temp: 98.3 F (36.8 C)     TempSrc: Oral     Resp: 16     Height:    (1.575 m)   Weight:   96.8 kg (213 lb 6.5 oz)   SpO2: 99%   98%   General: Not in acute distress HEENT:       Eyes: PERRL, EOMI, no scleral icterus.       ENT: No discharge from the ears and nose, no pharynx injection, no tonsillar enlargement.        Neck: No JVD, no bruit, no mass felt. Heme: No neck lymph node enlargement. Cardiac: S1/S2, RRR, No murmurs, No gallops or rubs. Pulm: mild wheezing bilaterally, No rales, rhonchi or rubs. Abd: Soft, nondistended, nontender, no rebound pain, no organomegaly, BS present. Ext: No pitting leg edema bilaterally. 2+DP/PT pulse bilaterally. Musculoskeletal: No joint deformities, No joint redness or  warmth, no limitation of ROM in spin. Skin: No rashes.  Neuro: Alert, oriented X3, cranial nerves II-XII grossly intact, muscle strength 5/5 in all extremities, sensation to light touch intact.  Psych: Patient is not psychotic, no suicidal or hemocidal ideation.  Labs on Admission:  Basic Metabolic Panel:  Recent Labs Lab 04/27/15 1747  NA 142  K 3.9  CL 108  CO2 25  GLUCOSE 101*  BUN 18  CREATININE 0.67  CALCIUM 8.8*   Liver Function Tests: No results for input(s): AST, ALT, ALKPHOS, BILITOT, PROT, ALBUMIN in the last 168 hours. No results for input(s): LIPASE, AMYLASE in the last 168 hours. No results for input(s): AMMONIA in the last 168 hours. CBC:  Recent Labs Lab 04/27/15 1747  WBC 7.3  NEUTROABS 2.8  HGB 11.8*  HCT 35.9*  MCV 88.6  PLT 204   Cardiac Enzymes: No  results for input(s): CKTOTAL, CKMB, CKMBINDEX, TROPONINI in the last 168 hours.  BNP (last 3 results) No results for input(s): BNP in the last 8760 hours.  ProBNP (last 3 results) No results for input(s): PROBNP in the last 8760 hours.  CBG: No results for input(s): GLUCAP in the last 168 hours.  Radiological Exams on Admission: Dg Chest 2 View  04/27/2015   CLINICAL DATA:  Mid chest pain and shortness of breath for 2 days  EXAM: CHEST  2 VIEW  COMPARISON:  January 23, 2015  FINDINGS: The heart size and mediastinal contours are within normal limits. There is minimal atelectasis of left lung base. There is no focal pneumonia, pulmonary edema, or pleural effusion. The visualized skeletal structures are stable.  IMPRESSION: Minimal atelectasis of left lung base.  No focal pneumonia.   Electronically Signed   By: Sherian Rein M.D.   On: 04/27/2015 20:01    EKG: Not done in ED, will get one.   Assessment/Plan Principal Problem:   Asthma exacerbation Active Problems:   Acute respiratory failure   Elevated BP   Anemia   Seasonal allergies   Arthritis  Acute respiratory failure due to Asthma  exacerbation: Patient's symptoms are consistent with asthma exacerbation. No infiltration on chest x-ray. No chest pain or signs of DVT, less likely to have PE. Currently hemodynamically stable.  -will admit patient to telemetry bed for obervation -Nebulizers: scheduled Duoneb and prn albuterol -Solu-Medrol 60 mg IV q8h -Oral azithromycin for 5 days.  -Mucinex for cough  -S. pneumococcal antigen -Follow up blood culture x2, sputum culture, Flu pc -check UDS, HIV ab  Elevated BP: Patient's blood pressure is elevated at 178/93, she does not have history of hypertension. Likely due to respiratory distress. -When necessary iv hydralazine  Anemia: Hgb stable. 11.4-->11.89 -follow by CBC  Seasonal allergies: -Afrin for nasal congetion  Arthritis: -Ibuprofen prn   DVT ppx: SQ Lovenox  Code Status: Full code Family Communication:  Yes, patient's husband at bed side Disposition Plan: Admit to inpatient   Date of Service 04/28/2015    Lorretta Harp [facility] Pager [phone]  If 7PM-7AM, please contact night-coverage [email or web address] Password [email or web address] 04/28/2015, 3:09 AM

## 2015-04-28 DIAGNOSIS — J9600 Acute respiratory failure, unspecified whether with hypoxia or hypercapnia: Secondary | ICD-10-CM | POA: Diagnosis not present

## 2015-04-28 DIAGNOSIS — I10 Essential (primary) hypertension: Secondary | ICD-10-CM | POA: Diagnosis present

## 2015-04-28 DIAGNOSIS — J208 Acute bronchitis due to other specified organisms: Secondary | ICD-10-CM

## 2015-04-28 DIAGNOSIS — R030 Elevated blood-pressure reading, without diagnosis of hypertension: Secondary | ICD-10-CM

## 2015-04-28 DIAGNOSIS — J45901 Unspecified asthma with (acute) exacerbation: Principal | ICD-10-CM

## 2015-04-28 DIAGNOSIS — J4541 Moderate persistent asthma with (acute) exacerbation: Secondary | ICD-10-CM

## 2015-04-28 DIAGNOSIS — D72829 Elevated white blood cell count, unspecified: Secondary | ICD-10-CM | POA: Diagnosis not present

## 2015-04-28 DIAGNOSIS — D500 Iron deficiency anemia secondary to blood loss (chronic): Secondary | ICD-10-CM

## 2015-04-28 DIAGNOSIS — J9601 Acute respiratory failure with hypoxia: Secondary | ICD-10-CM | POA: Diagnosis not present

## 2015-04-28 DIAGNOSIS — J209 Acute bronchitis, unspecified: Secondary | ICD-10-CM | POA: Diagnosis not present

## 2015-04-28 LAB — COMPREHENSIVE METABOLIC PANEL
ALBUMIN: 3.8 g/dL (ref 3.5–5.0)
ALK PHOS: 51 U/L (ref 38–126)
ALT: 25 U/L (ref 14–54)
ANION GAP: 7 (ref 5–15)
AST: 30 U/L (ref 15–41)
BILIRUBIN TOTAL: 0.8 mg/dL (ref 0.3–1.2)
BUN: 10 mg/dL (ref 6–20)
CALCIUM: 8.7 mg/dL — AB (ref 8.9–10.3)
CO2: 23 mmol/L (ref 22–32)
Chloride: 107 mmol/L (ref 101–111)
Creatinine, Ser: 0.58 mg/dL (ref 0.44–1.00)
GFR calc non Af Amer: >60 mL/min (ref >60)
Glucose, Bld: 212 mg/dL — ABNORMAL HIGH (ref 65–99)
POTASSIUM: 3.8 mmol/L (ref 3.5–5.1)
SODIUM: 137 mmol/L (ref 135–145)
TOTAL PROTEIN: 7.5 g/dL (ref 6.5–8.1)

## 2015-04-28 LAB — CBC
HEMATOCRIT: 36.3 % (ref 36.0–46.0)
HEMOGLOBIN: 11.9 g/dL — AB (ref 12.0–15.0)
MCH: 28.8 pg (ref 26.0–34.0)
MCHC: 32.8 g/dL (ref 30.0–36.0)
MCV: 87.9 fL (ref 78.0–100.0)
Platelets: 214 10*3/uL (ref 150–400)
RBC: 4.13 MIL/uL (ref 3.87–5.11)
RDW: 14.4 % (ref 11.5–15.5)
WBC: 8.6 10*3/uL (ref 4.0–10.5)

## 2015-04-28 LAB — RAPID URINE DRUG SCREEN, HOSP PERFORMED
Amphetamines: NOT DETECTED
BENZODIAZEPINES: NOT DETECTED
Barbiturates: NOT DETECTED
COCAINE: NOT DETECTED
Opiates: NOT DETECTED
Tetrahydrocannabinol: NOT DETECTED

## 2015-04-28 LAB — INFLUENZA PANEL BY PCR (TYPE A & B)
H1N1FLUPCR: NOT DETECTED
INFLBPCR: NEGATIVE
Influenza A By PCR: NEGATIVE

## 2015-04-28 LAB — HIV ANTIBODY (ROUTINE TESTING W REFLEX): HIV SCREEN 4TH GENERATION: NONREACTIVE

## 2015-04-28 LAB — STREP PNEUMONIAE URINARY ANTIGEN: Strep Pneumo Urinary Antigen: NEGATIVE

## 2015-04-28 MED ORDER — TRAMADOL HCL 50 MG PO TABS
50.0000 mg | ORAL_TABLET | Freq: Four times a day (QID) | ORAL | Status: DC | PRN
  Administered 2015-04-28 – 2015-05-01 (×9): 50 mg via ORAL
  Filled 2015-04-28 (×9): qty 1

## 2015-04-28 MED ORDER — IPRATROPIUM-ALBUTEROL 0.5-2.5 (3) MG/3ML IN SOLN
3.0000 mL | Freq: Four times a day (QID) | RESPIRATORY_TRACT | Status: DC
  Administered 2015-04-28 – 2015-04-29 (×5): 3 mL via RESPIRATORY_TRACT
  Filled 2015-04-28 (×5): qty 3

## 2015-04-28 MED ORDER — INFLUENZA VAC SPLIT QUAD 0.5 ML IM SUSY
0.5000 mL | PREFILLED_SYRINGE | INTRAMUSCULAR | Status: AC
  Administered 2015-04-30: 0.5 mL via INTRAMUSCULAR
  Filled 2015-04-28 (×4): qty 0.5

## 2015-04-28 MED ORDER — PNEUMOCOCCAL VAC POLYVALENT 25 MCG/0.5ML IJ INJ
0.5000 mL | INJECTION | INTRAMUSCULAR | Status: AC
  Administered 2015-04-30: 0.5 mL via INTRAMUSCULAR
  Filled 2015-04-28 (×4): qty 0.5

## 2015-04-28 NOTE — Progress Notes (Addendum)
Patient ID: Wendy James, female   DOB: May 27, 1971, 44 y.o.   MRN: [id number] [facility] PROGRESS NOTE  Wendy James GEX:[id number] DOB: [birth year] DOA: 04/27/2015 PCP: No PCP Per Patient   Brief narrative:    44 y.o. female with PMH of asthma, arthritis, seasonal allergy, who presented with 2 days duration of progressively worsening dyspnea at rest and with exertion, associated with wheezing and non productive cough.   In ED, patient was found to have WBC 7.3, temperature normal, no tachycardia, electrolytes okay. Chest x-ray showed minimal atelectasis, no infiltration.   Assessment/Plan:    Principal Problem:   Acute respiratory failure secondary to acute asthma exacerbation with acute bronchitis  - still with expiratory wheezing on exam this AM - continue Solumedrol, bronchodilators scheduled and as needed - possible taper of steroids in the next 24 hours  - continue with empiric Zithromax day #2   Active Problems:   Accelerated HTN - SBP still > 140 - add hydralazine as needed for now    Anemia of chronic disease, IDA - no signs of bleeding - CBC in AM    Obesity - Body mass index is 39.02 kg/(m^2).  DVT prophylaxis - Lovenox SQ  Code Status: Full.  Family Communication:  plan of care discussed with the patient Disposition Plan: Home when stable.   IV access:  Peripheral IV  Procedures and diagnostic studies:    Dg Chest 2 View 04/27/2015 Minimal atelectasis of left lung base.  No focal pneumonia.     Medical Consultants:  None   Other Consultants:  None  IAnti-Infectives:   Zithromax 9/28 -->  Debbora Presto, MD  [facility] [place] Pager [phone]  If 7PM-7AM, please contact night-coverage [email or web address] Password [facility] 04/28/2015, 10:51 AM   LOS: 1 day   HPI/Subjective: No events overnight.   Objective: Filed Vitals:   04/27/15 2347 04/28/15 0032 04/28/15 0558 04/28/15 0755  BP:   149/72   Pulse:   77   Temp:   98.4 F (36.9 C)    TempSrc:   Oral   Resp:   16   Height:  (1.575 m)     Weight: 96.8 kg (213 lb 6.5 oz)     SpO2:  98% 99% 97%    Intake/Output Summary (Last 24 hours) at 04/28/15 1051 Last data filed at 04/28/15 0830  Gross per 24 hour  Intake    360 ml  Output      0 ml  Net    360 ml    Exam:   General:  Pt is alert, follows commands appropriately, not in acute distress  Cardiovascular: Regular rate and rhythm, S1/S2, no murmurs, no rubs, no gallops  Respiratory: expiratory wheezing, diminished breath sounds at bases   Abdomen: Soft, non tender, non distended, bowel sounds present, no guarding  Extremities: No edema, pulses DP and PT palpable bilaterally  Neuro: Grossly nonfocal  Data Reviewed: Basic Metabolic Panel:  Recent Labs Lab 04/27/15 1747 04/28/15 0535  NA 142 137  K 3.9 3.8  CL 108 107  CO2 25 23  GLUCOSE 101* 212*  BUN 18 10  CREATININE 0.67 0.58  CALCIUM 8.8* 8.7*   Liver Function Tests:  Recent Labs Lab 04/28/15 0535  AST 30  ALT 25  ALKPHOS 51  BILITOT 0.8  PROT 7.5  ALBUMIN 3.8   CBC:  Recent Labs Lab 04/27/15 1747 04/28/15 0535  WBC 7.3 8.6  NEUTROABS 2.8  --   HGB 11.8* 11.9*  HCT 35.9*  36.3  MCV 88.6 87.9  PLT 204 214   Scheduled Meds: . [START ON 04/29/2015] azithromycin  250 mg Oral Daily  . dextromethorphan-guaiFENesin  1 tablet Oral BID  . enoxaparin (LOVENOX) injection  40 mg Subcutaneous QHS  . Influenza vac split quadrivalent PF  0.5 mL Intramuscular Tomorrow-1000  . ipratropium-albuterol  3 mL Nebulization Q6H  . methylPREDNISolone (SOLU-MEDROL) injection  60 mg Intravenous 3 times per day  . oxymetazoline  1 spray Each Nare BID  . pneumococcal 23 valent vaccine  0.5 mL Intramuscular Tomorrow-1000  . sodium chloride  3 mL Intravenous Q12H   Continuous Infusions:

## 2015-04-28 NOTE — Care Management Note (Signed)
Case Management Note  Patient Details  Name: DONNE ROBILLARD MRN: [id number] Date of Birth: 03-31-1971  Subjective/Objective: 44 y/o f admitted w/Asthma exac. From home.                   Action/Plan:d/c plan home.   Expected Discharge Date:                  Expected Discharge Plan:  Home/Self Care  In-House Referral:     Discharge planning Services  CM Consult  Post Acute Care Choice:    Choice offered to:     DME Arranged:    DME Agency:     HH Arranged:    HH Agency:     Status of Service:  In process, will continue to follow  Medicare Important Message Given:    Date Medicare IM Given:    Medicare IM give by:    Date Additional Medicare IM Given:    Additional Medicare Important Message give by:     If discussed at Long Length of Stay Meetings, dates discussed:    Additional Comments:  Lanier Clam, RN 04/28/2015, 11:20 AM

## 2015-04-29 DIAGNOSIS — J4541 Moderate persistent asthma with (acute) exacerbation: Secondary | ICD-10-CM | POA: Diagnosis not present

## 2015-04-29 DIAGNOSIS — D72829 Elevated white blood cell count, unspecified: Secondary | ICD-10-CM | POA: Diagnosis not present

## 2015-04-29 DIAGNOSIS — J9600 Acute respiratory failure, unspecified whether with hypoxia or hypercapnia: Secondary | ICD-10-CM | POA: Diagnosis present

## 2015-04-29 DIAGNOSIS — J45901 Unspecified asthma with (acute) exacerbation: Secondary | ICD-10-CM | POA: Diagnosis present

## 2015-04-29 DIAGNOSIS — J209 Acute bronchitis, unspecified: Secondary | ICD-10-CM | POA: Diagnosis present

## 2015-04-29 DIAGNOSIS — T380X5A Adverse effect of glucocorticoids and synthetic analogues, initial encounter: Secondary | ICD-10-CM

## 2015-04-29 DIAGNOSIS — E669 Obesity, unspecified: Secondary | ICD-10-CM | POA: Diagnosis present

## 2015-04-29 DIAGNOSIS — Z8249 Family history of ischemic heart disease and other diseases of the circulatory system: Secondary | ICD-10-CM | POA: Diagnosis not present

## 2015-04-29 DIAGNOSIS — Z87891 Personal history of nicotine dependence: Secondary | ICD-10-CM | POA: Diagnosis not present

## 2015-04-29 DIAGNOSIS — R0602 Shortness of breath: Secondary | ICD-10-CM | POA: Diagnosis present

## 2015-04-29 DIAGNOSIS — M1990 Unspecified osteoarthritis, unspecified site: Secondary | ICD-10-CM | POA: Diagnosis present

## 2015-04-29 DIAGNOSIS — J9601 Acute respiratory failure with hypoxia: Secondary | ICD-10-CM | POA: Diagnosis not present

## 2015-04-29 DIAGNOSIS — I10 Essential (primary) hypertension: Secondary | ICD-10-CM | POA: Diagnosis present

## 2015-04-29 DIAGNOSIS — Z6839 Body mass index (BMI) 39.0-39.9, adult: Secondary | ICD-10-CM | POA: Diagnosis not present

## 2015-04-29 DIAGNOSIS — R739 Hyperglycemia, unspecified: Secondary | ICD-10-CM | POA: Diagnosis present

## 2015-04-29 DIAGNOSIS — D638 Anemia in other chronic diseases classified elsewhere: Secondary | ICD-10-CM | POA: Diagnosis present

## 2015-04-29 DIAGNOSIS — J208 Acute bronchitis due to other specified organisms: Secondary | ICD-10-CM | POA: Diagnosis not present

## 2015-04-29 LAB — BASIC METABOLIC PANEL
ANION GAP: 8 (ref 5–15)
BUN: 13 mg/dL (ref 6–20)
CO2: 25 mmol/L (ref 22–32)
Calcium: 8.9 mg/dL (ref 8.9–10.3)
Chloride: 106 mmol/L (ref 101–111)
Creatinine, Ser: 0.69 mg/dL (ref 0.44–1.00)
GFR calc Af Amer: >60 mL/min (ref >60)
GFR calc non Af Amer: >60 mL/min (ref >60)
GLUCOSE: 156 mg/dL — AB (ref 65–99)
POTASSIUM: 4.2 mmol/L (ref 3.5–5.1)
Sodium: 139 mmol/L (ref 135–145)

## 2015-04-29 LAB — CBC
HCT: 36.2 % (ref 36.0–46.0)
Hemoglobin: 11.7 g/dL — ABNORMAL LOW (ref 12.0–15.0)
MCH: 29 pg (ref 26.0–34.0)
MCHC: 32.3 g/dL (ref 30.0–36.0)
MCV: 89.6 fL (ref 78.0–100.0)
Platelets: 233 10*3/uL (ref 150–400)
RBC: 4.04 MIL/uL (ref 3.87–5.11)
RDW: 14.8 % (ref 11.5–15.5)
WBC: 17.9 10*3/uL — AB (ref 4.0–10.5)

## 2015-04-29 MED ORDER — METHYLPREDNISOLONE SODIUM SUCC 125 MG IJ SOLR
80.0000 mg | Freq: Four times a day (QID) | INTRAMUSCULAR | Status: DC
  Administered 2015-04-29 – 2015-05-01 (×8): 80 mg via INTRAVENOUS
  Filled 2015-04-29 (×10): qty 1.28

## 2015-04-29 MED ORDER — IPRATROPIUM-ALBUTEROL 0.5-2.5 (3) MG/3ML IN SOLN
3.0000 mL | RESPIRATORY_TRACT | Status: DC
  Administered 2015-04-29 – 2015-04-30 (×9): 3 mL via RESPIRATORY_TRACT
  Filled 2015-04-29 (×10): qty 3

## 2015-04-29 NOTE — Care Management Note (Signed)
Case Management Note  Patient Details  Name: Wendy James MRN: [id number] Date of Birth: 1971/05/22  Subjective/Objective:44 y/o f admitted w/Acute resp failure.From home. No pcp. Patient in agreement to [facility] who will check & let me know about pcp appt.Will provide pcp resource list.                    Action/Plan:d/c plan home.   Expected Discharge Date:                  Expected Discharge Plan:  Home/Self Care  In-House Referral:     Discharge planning Services  CM Consult, [facility]  Post Acute Care Choice:    Choice offered to:     DME Arranged:    DME Agency:     HH Arranged:    HH Agency:     Status of Service:  In process, will continue to follow  Medicare Important Message Given:    Date Medicare IM Given:    Medicare IM give by:    Date Additional Medicare IM Given:    Additional Medicare Important Message give by:     If discussed at Long Length of Stay Meetings, dates discussed:    Additional Comments:  Lanier Clam, RN 04/29/2015, 1:01 PM

## 2015-04-29 NOTE — Progress Notes (Signed)
Patient ID: Wendy James, female   DOB: [birth year], 44 y.o.   MRN: [id number] [facility] PROGRESS NOTE  Wendy James QMV:[id number] DOB: 07-06-1971 DOA: 04/27/2015 PCP: No PCP Per Patient   Brief narrative:    44 y.o. female with PMH of asthma, arthritis, seasonal allergy, who presented with 2 days duration of progressively worsening dyspnea at rest and with exertion, associated with wheezing and non productive cough.   In ED, patient was found to have WBC 7.3, temperature normal, no tachycardia, electrolytes okay. Chest x-ray showed minimal atelectasis, no infiltration.   Assessment/Plan:    Principal Problem:   Acute respiratory failure secondary to acute asthma exacerbation with acute bronchitis  - worse wheezing this AM with tachypnea - continue Solumedrol and increase the dose and frequency to 80 mg Q6 hours - continue bronchodilators scheduled and as needed - possible taper of steroids in the next 24 hours  - continue with empiric Zithromax day #3  Active Problems:   Accelerated HTN - added hydralazine as needed and BP better controlled and at target range     Anemia of chronic disease, IDA - no signs of bleeding - CBC in AM    Steroid induced leukocytosis and hyperglycemia - monitor CBC and CBG's    Obesity - Body mass index is 39.02 kg/(m^2).  DVT prophylaxis - Lovenox SQ  Code Status: Full.  Family Communication:  plan of care discussed with the patient Disposition Plan: Home when stable.   IV access:  Peripheral IV  Procedures and diagnostic studies:    Dg Chest 2 View 04/27/2015 Minimal atelectasis of left lung base.  No focal pneumonia.     Medical Consultants:  None   Other Consultants:  None  IAnti-Infectives:   Zithromax 9/28 -->  Debbora Presto, MD  [facility] Pager [phone]  If 7PM-7AM, please contact night-coverage [email or web address] Password [email or web address] 04/29/2015, 9:23 AM   LOS: 2 days   HPI/Subjective: No events overnight.  Still concerned with wheezing and exertional dyspnea.   Objective: Filed Vitals:   04/28/15 2244 04/29/15 0223 04/29/15 0423 04/29/15 0726  BP: 148/87  137/87   Pulse: 86 82 79   Temp: 98.6 F (37 C)  98.2 F (36.8 C)   TempSrc: Oral  Oral   Resp: Height:      Weight:      SpO2: 98% 98% 97% 99%    Intake/Output Summary (Last 24 hours) at 04/29/15 0923 Last data filed at 04/29/15 0826  Gross per 24 hour  Intake    603 ml  Output      0 ml  Net    603 ml    Exam:   General:  Pt is alert, follows commands appropriately, not in acute distress  Cardiovascular: Regular rate and rhythm, S1/S2, no murmurs, no rubs, no gallops  Respiratory: expiratory wheezing, diminished breath sounds at bases, tachypnea with RR in mid 20's  Abdomen: Soft, non tender, non disttended, bowel sounds present, no guarding  Extremities: No edema, pulses DP and PT palpable bilaterally  Neuro: Grossly nonfocal  Data Reviewed: Basic Metabolic Panel:  Recent Labs Lab 04/27/15 1747 04/28/15 0535 04/29/15 0550  NA 142 137 139  K 3.9 3.8 4.2  CL 108 107 106  CO2 GLUCOSE 101* 212* 156*  BUN CREATININE 0.67 0.58 0.69  CALCIUM 8.8* 8.7* 8.9   Liver Function Tests:  Recent Labs Lab 04/28/15 0535  AST 30  ALT 25  ALKPHOS 51  BILITOT 0.8  PROT 7.5  ALBUMIN 3.8   CBC:  Recent Labs Lab 04/27/15 1747 04/28/15 0535 04/29/15 0550  WBC 7.3 8.6 17.9*  NEUTROABS 2.8  --   --   HGB 11.8* 11.9* 11.7*  HCT 35.9* 36.3 36.2  MCV 88.6 87.9 89.6  PLT 204 214 233   Scheduled Meds: . azithromycin  250 mg Oral Daily  . dextromethorphan-guaiFENesin  1 tablet Oral BID  . enoxaparin (LOVENOX) injection  40 mg Subcutaneous QHS  . Influenza vac split quadrivalent PF  0.5 mL Intramuscular Tomorrow-1000  . ipratropium-albuterol  3 mL Nebulization Q4H  . methylPREDNISolone (SOLU-MEDROL) injection  80 mg Intravenous Q6H  . oxymetazoline  1 spray Each Nare BID  .  pneumococcal 23 valent vaccine  0.5 mL Intramuscular Tomorrow-1000  . sodium chloride  3 mL Intravenous Q12H   Continuous Infusions:

## 2015-04-29 NOTE — Care Management Note (Signed)
Case Management Note  Patient Details  Name: AMORI COOPERMAN MRN: [id number] Date of Birth: [birth year]  Subjective/Objective:  [facility].                  Action/Plan:d/c home.   Expected Discharge Date:                  Expected Discharge Plan:  Home/Self Care  In-House Referral:     Discharge planning Services  CM Consult, [facility]  Post Acute Care Choice:    Choice offered to:     DME Arranged:    DME Agency:     HH Arranged:    HH Agency:     Status of Service:  In process, will continue to follow  Medicare Important Message Given:    Date Medicare IM Given:    Medicare IM give by:    Date Additional Medicare IM Given:    Additional Medicare Important Message give by:     If discussed at Long Length of Stay Meetings, dates discussed:    Additional Comments:  Lanier Clam, RN 04/29/2015, 1:03 PM

## 2015-04-29 NOTE — Care Management (Signed)
This Case Manager received communication from Lanier Clam, RN CM that patient does not have a PCP, has [facility].  Admitted for acute respiratory failure secondary to asthma exacerbation. Hospital follow-up appointment obtained on 05/06/15 at 1330 at [facility]. Appointment placed on AVS. Lanier Clam, RN CM updated.

## 2015-04-29 NOTE — Progress Notes (Signed)
Ambulated pt per order, tolerated well, some shortness of breath and coughing noted   Patient Saturations on Room Air at Rest = 98%  Patient Saturations on Room Air while/after Ambulating = 99-100%

## 2015-04-30 DIAGNOSIS — D72829 Elevated white blood cell count, unspecified: Secondary | ICD-10-CM

## 2015-04-30 DIAGNOSIS — R739 Hyperglycemia, unspecified: Secondary | ICD-10-CM

## 2015-04-30 LAB — BASIC METABOLIC PANEL
Anion gap: 8 (ref 5–15)
BUN: 13 mg/dL (ref 6–20)
CHLORIDE: 103 mmol/L (ref 101–111)
CO2: 26 mmol/L (ref 22–32)
CREATININE: 0.57 mg/dL (ref 0.44–1.00)
Calcium: 8.9 mg/dL (ref 8.9–10.3)
GFR calc non Af Amer: >60 mL/min (ref >60)
Glucose, Bld: 156 mg/dL — ABNORMAL HIGH (ref 65–99)
Potassium: 3.9 mmol/L (ref 3.5–5.1)
Sodium: 137 mmol/L (ref 135–145)

## 2015-04-30 LAB — CBC
HEMATOCRIT: 37.2 % (ref 36.0–46.0)
HEMOGLOBIN: 12.1 g/dL (ref 12.0–15.0)
MCH: 29 pg (ref 26.0–34.0)
MCHC: 32.5 g/dL (ref 30.0–36.0)
MCV: 89.2 fL (ref 78.0–100.0)
Platelets: 226 10*3/uL (ref 150–400)
RBC: 4.17 MIL/uL (ref 3.87–5.11)
RDW: 14.8 % (ref 11.5–15.5)
WBC: 17 10*3/uL — ABNORMAL HIGH (ref 4.0–10.5)

## 2015-04-30 MED ORDER — IPRATROPIUM-ALBUTEROL 0.5-2.5 (3) MG/3ML IN SOLN
3.0000 mL | RESPIRATORY_TRACT | Status: DC
  Administered 2015-05-01 (×3): 3 mL via RESPIRATORY_TRACT
  Filled 2015-04-30 (×4): qty 3

## 2015-04-30 NOTE — Progress Notes (Signed)
Patient ID: EDDITH MENTOR, female   DOB: Aug 28, 1970, 44 y.o.   MRN: [id number] [facility] PROGRESS NOTE  Sharnelle Cappelli Buer BJY:[id number] DOB: 1971/03/19 DOA: 04/27/2015 PCP: No PCP Per Patient   Brief narrative:    44 y.o. female with PMH of asthma, arthritis, seasonal allergy, who presented with 2 days duration of progressively worsening dyspnea at rest and with exertion, associated with wheezing and non productive cough.   In ED, patient was found to have WBC 7.3, temperature normal, no tachycardia, electrolytes okay. Chest x-ray showed minimal atelectasis, no infiltration.   Assessment/Plan:    Principal Problem:   Acute respiratory failure secondary to acute asthma exacerbation with acute bronchitis  - still wheezing this AM but better air movement  - continue Solumedrol 80 mg Q6 hours with no planned tapering yet until pt more comfortable  - continue bronchodilators scheduled and as needed - possible taper of steroids in the next 24 hours  - continue with empiric Zithromax day #4  Active Problems:   Accelerated HTN - since SBP still > 140, will add low dose Hydralazine 10 mg BID PO - continue hydralazine as needed and BP better controlled and at target range     Anemia of chronic disease, IDA - no signs of bleeding - CBC in AM    Steroid induced leukocytosis and hyperglycemia - monitor CBC and CBG's    Obesity - Body mass index is 39.02 kg/(m^2).  DVT prophylaxis - Lovenox SQ  Code Status: Full.  Family Communication:  plan of care discussed with the patient Disposition Plan: Home when stable.   IV access:  Peripheral IV  Procedures and diagnostic studies:    Dg Chest 2 View 04/27/2015 Minimal atelectasis of left lung base.  No focal pneumonia.     Medical Consultants:  None   Other Consultants:  None  IAnti-Infectives:   Zithromax 9/28 -->  Debbora Presto, MD  [facility] Pager [phone]  If 7PM-7AM, please contact  night-coverage [email or web address] Password [email or web address] 04/30/2015, 11:29 AM   LOS: 3 days   HPI/Subjective: No events overnight. Still concerned with wheezing and exertional dyspnea.   Objective: Filed Vitals:   04/30/15 0546 04/30/15 0617 04/30/15 0723 04/30/15 1126  BP: 156/100 145/91    Pulse: 81  75 70  Temp: 98.9 F (37.2 C)     TempSrc: Oral     Resp: Height:      Weight:      SpO2: 94%  95% 99%    Intake/Output Summary (Last 24 hours) at 04/30/15 1129 Last data filed at 04/30/15 0900  Gross per 24 hour  Intake   1240 ml  Output      0 ml  Net   1240 ml    Exam:   General:  Pt is alert, follows commands appropriately, not in acute distress  Cardiovascular: Regular rate and rhythm, S1/S2, no murmurs, no rubs, no gallops  Respiratory: expiratory wheezing, diminished breath sounds at bases, better air movement   Abdomen: Soft, non tender, non disttended, bowel sounds present, no guarding  Extremities: No edema, pulses DP and PT palpable bilaterally  Neuro: Grossly nonfocal  Data Reviewed: Basic Metabolic Panel:  Recent Labs Lab 04/27/15 1747 04/28/15 0535 04/29/15 0550 04/30/15 0600  NA 142 137 139 137  K 3.9 3.8 4.2 3.9  CL 108 107 106 103  CO2 GLUCOSE 101* 212* 156* 156*  BUN CREATININE 0.67  0.58 0.69 0.57  CALCIUM 8.8* 8.7* 8.9 8.9   Liver Function Tests:  Recent Labs Lab 04/28/15 0535  AST 30  ALT 25  ALKPHOS 51  BILITOT 0.8  PROT 7.5  ALBUMIN 3.8   CBC:  Recent Labs Lab 04/27/15 1747 04/28/15 0535 04/29/15 0550 04/30/15 0600  WBC 7.3 8.6 17.9* 17.0*  NEUTROABS 2.8  --   --   --   HGB 11.8* 11.9* 11.7* 12.1  HCT 35.9* 36.3 36.2 37.2  MCV 88.6 87.9 89.6 89.2  PLT 204 214 233 226   Scheduled Meds: . azithromycin  250 mg Oral Daily  . dextromethorphan-guaiFENesin  1 tablet Oral BID  . enoxaparin (LOVENOX) injection  40 mg Subcutaneous QHS  . Influenza vac split quadrivalent PF  0.5 mL  Intramuscular Tomorrow-1000  . ipratropium-albuterol  3 mL Nebulization Q4H  . methylPREDNISolone (SOLU-MEDROL) injection  80 mg Intravenous Q6H  . oxymetazoline  1 spray Each Nare BID  . pneumococcal 23 valent vaccine  0.5 mL Intramuscular Tomorrow-1000  . sodium chloride  3 mL Intravenous Q12H   Continuous Infusions:

## 2015-04-30 NOTE — Progress Notes (Signed)
RT completed assessment. Patient's score is a 6, but she feels she needs to continue the Q4 treatments at this time.  RT will continue to monitor.

## 2015-05-01 LAB — BASIC METABOLIC PANEL
Anion gap: 7 (ref 5–15)
BUN: 14 mg/dL (ref 6–20)
CO2: 28 mmol/L (ref 22–32)
CREATININE: 0.58 mg/dL (ref 0.44–1.00)
Calcium: 8.7 mg/dL — ABNORMAL LOW (ref 8.9–10.3)
Chloride: 102 mmol/L (ref 101–111)
Glucose, Bld: 154 mg/dL — ABNORMAL HIGH (ref 65–99)
POTASSIUM: 4.1 mmol/L (ref 3.5–5.1)
SODIUM: 137 mmol/L (ref 135–145)

## 2015-05-01 LAB — CBC
HCT: 36.3 % (ref 36.0–46.0)
Hemoglobin: 11.8 g/dL — ABNORMAL LOW (ref 12.0–15.0)
MCH: 28.9 pg (ref 26.0–34.0)
MCHC: 32.5 g/dL (ref 30.0–36.0)
MCV: 89 fL (ref 78.0–100.0)
PLATELETS: 215 10*3/uL (ref 150–400)
RBC: 4.08 MIL/uL (ref 3.87–5.11)
RDW: 14.7 % (ref 11.5–15.5)
WBC: 13.4 10*3/uL — ABNORMAL HIGH (ref 4.0–10.5)

## 2015-05-01 LAB — EXPECTORATED SPUTUM ASSESSMENT W REFEX TO RESP CULTURE

## 2015-05-01 LAB — EXPECTORATED SPUTUM ASSESSMENT W GRAM STAIN, RFLX TO RESP C

## 2015-05-01 MED ORDER — HYDRALAZINE HCL 10 MG PO TABS
10.0000 mg | ORAL_TABLET | Freq: Three times a day (TID) | ORAL | Status: DC
  Administered 2015-05-01 – 2015-05-02 (×3): 10 mg via ORAL
  Filled 2015-05-01 (×4): qty 1

## 2015-05-01 MED ORDER — ALBUTEROL SULFATE (2.5 MG/3ML) 0.083% IN NEBU: 2.5000 mg | INHALATION_SOLUTION | Freq: Four times a day (QID) | RESPIRATORY_TRACT | Status: DC | PRN

## 2015-05-01 MED ORDER — METHYLPREDNISOLONE SODIUM SUCC 40 MG IJ SOLR
40.0000 mg | Freq: Two times a day (BID) | INTRAMUSCULAR | Status: DC
Start: 2015-05-01 — End: 2015-05-02
  Administered 2015-05-01 – 2015-05-02 (×2): 40 mg via INTRAVENOUS
  Filled 2015-05-01 (×3): qty 1

## 2015-05-01 MED ORDER — IPRATROPIUM-ALBUTEROL 0.5-2.5 (3) MG/3ML IN SOLN
3.0000 mL | Freq: Three times a day (TID) | RESPIRATORY_TRACT | Status: DC
  Administered 2015-05-02 – 2015-05-03 (×4): 3 mL via RESPIRATORY_TRACT
  Filled 2015-05-01 (×4): qty 3

## 2015-05-01 NOTE — Progress Notes (Addendum)
Patient ID: Wendy James, female   DOB: [birth year], 44 y.o.   MRN: [id number] [facility] PROGRESS NOTE  Becci Batty Asbill [id number]:[id number] DOB: Jul 30, 1971 DOA: 04/27/2015 PCP: No PCP Per Patient   Brief narrative:    44 y.o. female with PMH of asthma, arthritis, seasonal allergy, who presented with 2 days duration of progressively worsening dyspnea at rest and with exertion, associated with wheezing and non productive cough.   In ED, patient was found to have WBC 7.3, temperature normal, no tachycardia, electrolytes okay. Chest x-ray showed minimal atelectasis, no infiltration.   Assessment/Plan:    Principal Problem:   Acute respiratory failure secondary to acute asthma exacerbation with acute bronchitis  - no wheezing this AM, will start tapering down, change Solumedrol 40 mg BID IV, possible transition to Prednisone in AM - continue bronchodilators scheduled and as needed - continue with empiric Zithromax day #5/7  Active Problems:   Accelerated HTN - since SBP still > 140, continue Hydralazine 10 mg PO TID - continue hydralazine as needed and BP better controlled and at target range     Anemia of chronic disease, IDA - no signs of bleeding - CBC in AM    Steroid induced leukocytosis and hyperglycemia - reasonable stable  - monitor CBC and CBG's    Obesity - Body mass index is 39.02 kg/(m^2).  DVT prophylaxis - Lovenox SQ  Code Status: Full.  Family Communication:  plan of care discussed with the patient Disposition Plan: Home in 24 - 48 hours   IV access:  Peripheral IV  Procedures and diagnostic studies:    Dg Chest 2 View 04/27/2015 Minimal atelectasis of left lung base.  No focal pneumonia.     Medical Consultants:  None   Other Consultants:  None  IAnti-Infectives:   Zithromax 9/28 -->  Debbora Presto, MD  [facility] Pager [phone]  If 7PM-7AM, please contact night-coverage [email or web address] Password [email or web address] 05/01/2015, 9:49 AM   LOS: 4 days    HPI/Subjective: No events overnight.  Objective: Filed Vitals:   04/30/15 2149 04/30/15 2244 05/01/15 0616 05/01/15 0911  BP: 158/90  162/91   Pulse: 62  60   Temp: 98.1 F (36.7 C)  98.7 F (37.1 C)   TempSrc: Oral  Oral   Resp: 16  18   Height:      Weight:      SpO2: 98% 99% 98% 98%    Intake/Output Summary (Last 24 hours) at 05/01/15 0949 Last data filed at 05/01/15 0900  Gross per 24 hour  Intake   2600 ml  Output      0 ml  Net   2600 ml    Exam:   General:  Pt is alert, follows commands appropriately, not in acute distress  Cardiovascular: Regular rate and rhythm, S1/S2, no murmurs, no rubs, no gallops  Respiratory: better air movement bilaterally with no wheezing   Abdomen: Soft, non tender, non disttended, bowel sounds present, no guarding  Extremities: No edema, pulses DP and PT palpable bilaterally  Neuro: Grossly nonfocal  Data Reviewed: Basic Metabolic Panel:  Recent Labs Lab 04/27/15 1747 04/28/15 0535 04/29/15 0550 04/30/15 0600 05/01/15 0502  NA 142 137 139 137 137  K 3.9 3.8 4.2 3.9 4.1  CL 108 107 106 103 102  CO2 GLUCOSE 101* 212* 156* 156* 154*  BUN CREATININE 0.67 0.58 0.69 0.57 0.58  CALCIUM 8.8* 8.7* 8.9 8.9 8.7*  Liver Function Tests:  Recent Labs Lab 04/28/15 0535  AST 30  ALT 25  ALKPHOS 51  BILITOT 0.8  PROT 7.5  ALBUMIN 3.8   CBC:  Recent Labs Lab 04/27/15 1747 04/28/15 0535 04/29/15 0550 04/30/15 0600 05/01/15 0502  WBC 7.3 8.6 17.9* 17.0* 13.4*  NEUTROABS 2.8  --   --   --   --   HGB 11.8* 11.9* 11.7* 12.1 11.8*  HCT 35.9* 36.3 36.2 37.2 36.3  MCV 88.6 87.9 89.6 89.2 89.0  PLT 204 214 233 226 215   Scheduled Meds: . azithromycin  250 mg Oral Daily  . dextromethorphan-guaiFENesin  1 tablet Oral BID  . enoxaparin (LOVENOX) injection  40 mg Subcutaneous QHS  . ipratropium-albuterol  3 mL Nebulization Q4H WA  . methylPREDNISolone (SOLU-MEDROL) injection  40 mg  Intravenous Q12H  . oxymetazoline  1 spray Each Nare BID  . sodium chloride  3 mL Intravenous Q12H   Continuous Infusions:

## 2015-05-02 MED ORDER — HYDRALAZINE HCL 25 MG PO TABS
25.0000 mg | ORAL_TABLET | Freq: Three times a day (TID) | ORAL | Status: DC
  Administered 2015-05-02 – 2015-05-03 (×3): 25 mg via ORAL
  Filled 2015-05-02 (×4): qty 1

## 2015-05-02 MED ORDER — HYDRALAZINE HCL 20 MG/ML IJ SOLN: 10.0000 mg | INTRAMUSCULAR | Status: DC | PRN

## 2015-05-02 MED ORDER — PREDNISONE 50 MG PO TABS
50.0000 mg | ORAL_TABLET | Freq: Every day | ORAL | Status: DC
  Administered 2015-05-03: 50 mg via ORAL
  Filled 2015-05-02: qty 1

## 2015-05-02 NOTE — Care Management Note (Signed)
Case Management Note  Patient Details  Name: KAHEALANI YANKOVICH MRN: [id number] Date of Birth: 06/19/1971  Subjective/Objective:     Has [facility] pcp appt set.               Action/Plan:d/c home no needs or orders.   Expected Discharge Date:                  Expected Discharge Plan:  Home/Self Care  In-House Referral:     Discharge planning Services  CM Consult, [facility]  Post Acute Care Choice:    Choice offered to:     DME Arranged:    DME Agency:     HH Arranged:    HH Agency:     Status of Service:  Completed, signed off  Medicare Important Message Given:    Date Medicare IM Given:    Medicare IM give by:    Date Additional Medicare IM Given:    Additional Medicare Important Message give by:     If discussed at Long Length of Stay Meetings, dates discussed:    Additional Comments:  Lanier Clam, RN 05/02/2015, 7:56 PM

## 2015-05-02 NOTE — Progress Notes (Signed)
BP continues to be elevated.  At HS 163/102 (decreased p hs po hydralazine) and then this AM 154/102.  Given AM po dose of hydralazine plus PRN IV dose.  Will have day RN recheck.

## 2015-05-02 NOTE — Progress Notes (Signed)
Patient ID: JOANIE DUPREY, female   DOB: [birth year], 44 y.o.   MRN: [id number] [facility] PROGRESS NOTE  Harper Vandervoort Pettway BJY:[id number] DOB: [birth year] DOA: 04/27/2015 PCP: No PCP Per Patient   Brief narrative:    44 y.o. female with PMH of asthma, arthritis, seasonal allergy, who presented with 2 days duration of progressively worsening dyspnea at rest and with exertion, associated with wheezing and non productive cough.   In ED, patient was found to have WBC 7.3, temperature normal, no tachycardia, electrolytes okay. Chest x-ray showed minimal atelectasis, no infiltration.   Assessment/Plan:    Principal Problem:   Acute respiratory failure secondary to acute asthma exacerbation with acute bronchitis  - no wheezing this AM, continue tapering down steroids, transition to oral Prednisone  - continue bronchodilators scheduled and as needed - continue with empiric Zithromax day #6/7  Active Problems:   Accelerated HTN - since SBP still > 140, continue Hydralazine but increase the dose to 25 mg PO TID - continue hydralazine as needed and BP better controlled and at target range     Anemia of chronic disease, IDA - no signs of bleeding - CBC in AM    Steroid induced leukocytosis and hyperglycemia - reasonable stable  - monitor CBC and CBG's    Obesity - Body mass index is 39.02 kg/(m^2).  DVT prophylaxis - Lovenox SQ  Code Status: Full.  Family Communication:  plan of care discussed with the patient Disposition Plan: Home in AM  IV access:  Peripheral IV  Procedures and diagnostic studies:    Dg Chest 2 View 04/27/2015 Minimal atelectasis of left lung base.  No focal pneumonia.     Medical Consultants:  None   Other Consultants:  None  IAnti-Infectives:   Zithromax 9/28 -->  Debbora Presto, MD  [facility] Pager [phone]  If 7PM-7AM, please contact night-coverage [email or web address] Password [email or web address] 05/02/2015, 9:19 AM   LOS: 5 days   HPI/Subjective: No  events overnight.  Objective: Filed Vitals:   05/01/15 2219 05/01/15 2300 05/02/15 0602 05/02/15 0725  BP: 163/102 155/96 154/102   Pulse: 72  74   Temp: 97.7 F (36.5 C)  99 F (37.2 C)   TempSrc: Oral  Oral   Resp: 16  16   Height:      Weight:      SpO2: 98%  100% 96%    Intake/Output Summary (Last 24 hours) at 05/02/15 0919 Last data filed at 05/01/15 2300  Gross per 24 hour  Intake   1440 ml  Output      0 ml  Net   1440 ml    Exam:   General:  Pt is alert, follows commands appropriately, not in acute distress  Cardiovascular: Regular rate and rhythm, S1/S2, no murmurs, no rubs, no gallops  Respiratory: better air movement bilaterally with no wheezing   Abdomen: Soft, non tender, non disttended, bowel sounds present, no guarding  Extremities: No edema, pulses DP and PT palpable bilaterally  Neuro: Grossly nonfocal  Data Reviewed: Basic Metabolic Panel:  Recent Labs Lab 04/27/15 1747 04/28/15 0535 04/29/15 0550 04/30/15 0600 05/01/15 0502  NA 142 137 139 137 137  K 3.9 3.8 4.2 3.9 4.1  CL 108 107 106 103 102  CO2 GLUCOSE 101* 212* 156* 156* 154*  BUN CREATININE 0.67 0.58 0.69 0.57 0.58  CALCIUM 8.8* 8.7* 8.9 8.9 8.7*   Liver Function Tests:  Recent Labs  Lab 04/28/15 0535  AST 30  ALT 25  ALKPHOS 51  BILITOT 0.8  PROT 7.5  ALBUMIN 3.8   CBC:  Recent Labs Lab 04/27/15 1747 04/28/15 0535 04/29/15 0550 04/30/15 0600 05/01/15 0502  WBC 7.3 8.6 17.9* 17.0* 13.4*  NEUTROABS 2.8  --   --   --   --   HGB 11.8* 11.9* 11.7* 12.1 11.8*  HCT 35.9* 36.3 36.2 37.2 36.3  MCV 88.6 87.9 89.6 89.2 89.0  PLT 204 214 233 226 215   Scheduled Meds: . azithromycin  250 mg Oral Daily  . dextromethorphan-guaiFENesin  1 tablet Oral BID  . enoxaparin (LOVENOX) injection  40 mg Subcutaneous QHS  . hydrALAZINE  25 mg Oral 3 times per day  . ipratropium-albuterol  3 mL Nebulization TID  . oxymetazoline  1 spray Each  Nare BID  . [START ON 05/03/2015] predniSONE  50 mg Oral Q breakfast  . sodium chloride  3 mL Intravenous Q12H   Continuous Infusions:

## 2015-05-03 DIAGNOSIS — J9601 Acute respiratory failure with hypoxia: Secondary | ICD-10-CM

## 2015-05-03 LAB — CULTURE, BLOOD (ROUTINE X 2)
Culture: NO GROWTH
Culture: NO GROWTH

## 2015-05-03 MED ORDER — HYDRALAZINE HCL 50 MG PO TABS: 50.0000 mg | ORAL_TABLET | Freq: Three times a day (TID) | ORAL | Status: DC

## 2015-05-03 MED ORDER — TRAMADOL HCL 50 MG PO TABS: 50.0000 mg | ORAL_TABLET | Freq: Four times a day (QID) | ORAL | Status: DC | PRN

## 2015-05-03 MED ORDER — ALBUTEROL SULFATE (2.5 MG/3ML) 0.083% IN NEBU: 2.5000 mg | INHALATION_SOLUTION | Freq: Four times a day (QID) | RESPIRATORY_TRACT | Status: DC | PRN

## 2015-05-03 MED ORDER — HYDRALAZINE HCL 50 MG PO TABS
50.0000 mg | ORAL_TABLET | Freq: Three times a day (TID) | ORAL | Status: DC
  Administered 2015-05-03: 25 mg via ORAL
  Filled 2015-05-03: qty 1

## 2015-05-03 MED ORDER — ALBUTEROL SULFATE HFA 108 (90 BASE) MCG/ACT IN AERS: 2.0000 | INHALATION_SPRAY | Freq: Four times a day (QID) | RESPIRATORY_TRACT | Status: DC | PRN

## 2015-05-03 MED ORDER — PREDNISONE 10 MG PO TABS: ORAL_TABLET | ORAL | Status: DC

## 2015-05-03 NOTE — Discharge Instructions (Signed)

## 2015-05-03 NOTE — Discharge Summary (Signed)
Physician Discharge Summary  Wendy James RUE:[id number] DOB: Jul 21, 1971 DOA: 04/27/2015  PCP: No PCP Per Patient  Admit date: 04/27/2015 Discharge date: 05/03/2015  Recommendations for Outpatient Follow-up:  1. Pt will need to follow up with PCP in 2-3 weeks post discharge 2. Please obtain BMP to evaluate electrolytes and kidney function 3. Please also check CBC to evaluate Hg and Hct levels  Discharge Diagnoses:  Principal Problem:   Acute respiratory failure (HCC) Active Problems:   Acute asthma exacerbation   Bronchitis, acute   Accelerated hypertension   Anemia of chroic diseae, IDA   Leukocytosis, steroid induced    Steroid-induced hyperglycemia   Asthma exacerbation  Discharge Condition: Stable  Diet recommendation: Heart healthy diet discussed in details    Brief narrative:    44 y.o. female with PMH of asthma, arthritis, seasonal allergy, who presented with 2 days duration of progressively worsening dyspnea at rest and with exertion, associated with wheezing and non productive cough.   In ED, patient was found to have WBC 7.3, temperature normal, no tachycardia, electrolytes okay. Chest x-ray showed minimal atelectasis, no infiltration.   Assessment/Plan:    Principal Problem:  Acute respiratory failure secondary to acute asthma exacerbation with acute bronchitis  - no wheezing this AM,  transition to oral Prednisone to continue with tapering upon discharge  - continue bronchodilators scheduled and as needed - pt has completed course of Zithromax while inpatient   Active Problems:  Accelerated HTN - pt started on Hydralazine upon discharge    Anemia of chronic disease, IDA - no signs of bleeding   Steroid induced leukocytosis and hyperglycemia - reasonable stable, WBC trending down    Obesity - Body mass index is 39.02 kg/(m^2).   Code Status: Full.  Family Communication: plan of care discussed with the patient Disposition Plan: Home    IV access:  Peripheral IV  Procedures and diagnostic studies:   Dg Chest 2 View 04/27/2015 Minimal atelectasis of left lung base. No focal pneumonia.   Medical Consultants:  None   Other Consultants:  None  IAnti-Infectives:   Zithromax 9/28 -->       Discharge Exam: Filed Vitals:   05/03/15 0614  BP: 161/105  Pulse: 73  Temp: 99.4 F (37.4 C)  Resp: 18   Filed Vitals:   05/02/15 2020 05/02/15 2043 05/03/15 0614 05/03/15 0747  BP: 167/91  161/105   Pulse: 67  73   Temp: 99.4 F (37.4 C)  99.4 F (37.4 C)   TempSrc: Oral  Oral   Resp: 16  18   Height:      Weight:      SpO2: 93% 96% 98% 97%    General: Pt is alert, follows commands appropriately, not in acute distress Cardiovascular: Regular rate and rhythm, S1/S2 +, no murmurs, no rubs, no gallops Respiratory: Clear to auscultation bilaterally, no wheezing, no crackles, no rhonchi Abdominal: Soft, non tender, non distended, bowel sounds +, no guarding Extremities: no edema, no cyanosis, pulses palpable bilaterally DP and PT Neuro: Grossly nonfocal  Discharge Instructions  Discharge Instructions    Diet - low sodium heart healthy    Complete by:  As directed      Increase activity slowly    Complete by:  As directed             Medication List    STOP taking these medications        metroNIDAZOLE 500 MG tablet  Commonly known as:  FLAGYL  misoprostol 200 MCG tablet  Commonly known as:  CYTOTEC     oxyCODONE-acetaminophen 5-325 MG tablet  Commonly known as:  PERCOCET/ROXICET     oxymetazoline 0.05 % nasal spray  Commonly known as:  AFRIN NASAL SPRAY      TAKE these medications        albuterol 108 (90 BASE) MCG/ACT inhaler  Commonly known as:  PROVENTIL HFA;VENTOLIN HFA  Inhale 2 puffs into the lungs every 6 (six) hours as needed for wheezing or shortness of breath.     albuterol (2.5 MG/3ML) 0.083% nebulizer solution  Commonly known as:  PROVENTIL  Take 3 mLs  (2.5 mg total) by nebulization every 6 (six) hours as needed for wheezing or shortness of breath.     guaiFENesin 100 MG/5ML liquid  Commonly known as:  ROBITUSSIN  Take 5-10 mLs (100-200 mg total) by mouth every 4 (four) hours as needed for cough.     hydrALAZINE 50 MG tablet  Commonly known as:  APRESOLINE  Take 1 tablet (50 mg total) by mouth every 8 (eight) hours.     ibuprofen 200 MG tablet  Commonly known as:  ADVIL,MOTRIN  Take 400 mg by mouth every 6 (six) hours as needed for cramping.     predniSONE 10 MG tablet  Commonly known as:  DELTASONE  Take 50 mg tablet today and taper down by 10 mg daily until completed     traMADol 50 MG tablet  Commonly known as:  ULTRAM  Take 1 tablet (50 mg total) by mouth every 6 (six) hours as needed for moderate pain.           Follow-up Information    Follow up with [facility] On 05/06/2015.   Why:  Hospital follow-up appointment on 05/06/15 at 1:30 at [facility].   Contact information:   [address] [place] [address] [phone]      Call Debbora Presto, MD.   Specialty:  Internal Medicine   Why:  As needed call my cell phone [phone]   Contact information:   [address] [place] [address] [phone]        The results of significant diagnostics from this hospitalization (including imaging, microbiology, ancillary and laboratory) are listed below for reference.     Microbiology: Recent Results (from the past 240 hour(s))  Culture, blood (routine x 2) Call MD if unable to obtain prior to antibiotics being given     Status: None (Preliminary result)   Collection Time: 04/27/15 10:45 PM  Result Value Ref Range Status   Specimen Description BLOOD LEFT ANTECUBITAL  Final   Special Requests BOTTLES DRAWN AEROBIC AND ANAEROBIC 10 CC  Final   Culture   Final    NO GROWTH 4 DAYS Performed at [facility]    Report Status PENDING   Incomplete  Culture, blood (routine x 2) Call MD if unable to obtain prior to antibiotics being given     Status: None (Preliminary result)   Collection Time: 04/27/15 11:00 PM  Result Value Ref Range Status   Specimen Description BLOOD RIGHT ANTECUBITAL  Final   Special Requests BOTTLES DRAWN AEROBIC AND ANAEROBIC 5 ML  Final   Culture   Final    NO GROWTH 4 DAYS Performed at [facility]    Report Status PENDING  Incomplete  Culture, sputum-assessment     Status: None   Collection Time: 05/01/15  8:00 AM  Result Value Ref Range Status   Specimen Description SPUTUM  Final   Special Requests NONE  Final   Sputum evaluation   Final    Sputum specimen not acceptable for testing.  Please recollect.   S BRIGHT AT 0859 ON 10.02.2016 BY NBROOKS   Report Status 05/01/2015 FINAL  Final     Labs: Basic Metabolic Panel:  Recent Labs Lab 04/27/15 1747 04/28/15 0535 04/29/15 0550 04/30/15 0600 05/01/15 0502  NA 142 137 139 137 137  K 3.9 3.8 4.2 3.9 4.1  CL 108 107 106 103 102  CO2 GLUCOSE 101* 212* 156* 156* 154*  BUN CREATININE 0.67 0.58 0.69 0.57 0.58  CALCIUM 8.8* 8.7* 8.9 8.9 8.7*   Liver Function Tests:  Recent Labs Lab 04/28/15 0535  AST 30  ALT 25  ALKPHOS 51  BILITOT 0.8  PROT 7.5  ALBUMIN 3.8   No results for input(s): LIPASE, AMYLASE in the last 168 hours. No results for input(s): AMMONIA in the last 168 hours. CBC:  Recent Labs Lab 04/27/15 1747 04/28/15 0535 04/29/15 0550 04/30/15 0600 05/01/15 0502  WBC 7.3 8.6 17.9* 17.0* 13.4*  NEUTROABS 2.8  --   --   --   --   HGB 11.8* 11.9* 11.7* 12.1 11.8*  HCT 35.9* 36.3 36.2 37.2 36.3  MCV 88.6 87.9 89.6 89.2 89.0  PLT 204 214 233 226 215   Cardiac Enzymes: No results for input(s): CKTOTAL, CKMB, CKMBINDEX, TROPONINI in the last 168 hours. BNP: BNP (last 3 results) No results for input(s): BNP in the last 8760 hours.  ProBNP (last 3 results) No results for  input(s): PROBNP in the last 8760 hours.  CBG: No results for input(s): GLUCAP in the last 168 hours.   SIGNED: Time coordinating discharge: 30 minutes  MAGICK-MYERS, ISKRA, MD  [facility] 05/03/2015, 10:10 AM Pager [phone]  If 7PM-7AM, please contact night-coverage [email or web address] Password [email or web address]

## 2015-05-03 NOTE — Care Management Note (Signed)
Case Management Note  Patient Details  Name: Wendy James MRN: [id number] Date of Birth: 04-Aug-1970  Subjective/Objective: Patient to got to [facility] pharmacy for meds.                   Action/Plan:   Expected Discharge Date:                  Expected Discharge Plan:  Home/Self Care  In-House Referral:     Discharge planning Services  CM Consult, [facility]  Post Acute Care Choice:    Choice offered to:     DME Arranged:    DME Agency:     HH Arranged:    HH Agency:     Status of Service:  Completed, signed off  Medicare Important Message Given:    Date Medicare IM Given:    Medicare IM give by:    Date Additional Medicare IM Given:    Additional Medicare Important Message give by:     If discussed at Long Length of Stay Meetings, dates discussed:    Additional Comments:  Lanier Clam, RN 05/03/2015, 12:19 PM

## 2015-05-04 ENCOUNTER — Ambulatory Visit: Payer: 59 | Admitting: Obstetrics & Gynecology

## 2015-05-06 ENCOUNTER — Encounter: Payer: Self-pay | Admitting: Family Medicine

## 2015-05-06 ENCOUNTER — Ambulatory Visit (INDEPENDENT_AMBULATORY_CARE_PROVIDER_SITE_OTHER): Payer: 59 | Admitting: Family Medicine

## 2015-05-06 VITALS — BP 144/85 | HR 68 | Temp 98.1°F | Resp 16 | Ht 62.0 in | Wt 217.0 lb

## 2015-05-06 DIAGNOSIS — J4520 Mild intermittent asthma, uncomplicated: Secondary | ICD-10-CM

## 2015-05-06 DIAGNOSIS — E669 Obesity, unspecified: Secondary | ICD-10-CM | POA: Diagnosis not present

## 2015-05-06 DIAGNOSIS — I10 Essential (primary) hypertension: Secondary | ICD-10-CM | POA: Diagnosis not present

## 2015-05-06 DIAGNOSIS — Z0000 Encounter for general adult medical examination without abnormal findings: Secondary | ICD-10-CM

## 2015-05-06 LAB — CBC WITH DIFFERENTIAL/PLATELET
BASOS ABS: 0 10*3/uL (ref 0.0–0.1)
Basophils Relative: 0 % (ref 0–1)
EOS ABS: 0.2 10*3/uL (ref 0.0–0.7)
EOS PCT: 2 % (ref 0–5)
HEMATOCRIT: 37.8 % (ref 36.0–46.0)
Hemoglobin: 12.4 g/dL (ref 12.0–15.0)
LYMPHS ABS: 3.5 10*3/uL (ref 0.7–4.0)
LYMPHS PCT: 39 % (ref 12–46)
MCH: 28.6 pg (ref 26.0–34.0)
MCHC: 32.8 g/dL (ref 30.0–36.0)
MCV: 87.3 fL (ref 78.0–100.0)
MONOS PCT: 8 % (ref 3–12)
MPV: 11 fL (ref 8.6–12.4)
Monocytes Absolute: 0.7 10*3/uL (ref 0.1–1.0)
Neutro Abs: 4.5 10*3/uL (ref 1.7–7.7)
Neutrophils Relative %: 51 % (ref 43–77)
PLATELETS: 228 10*3/uL (ref 150–400)
RBC: 4.33 MIL/uL (ref 3.87–5.11)
RDW: 15.2 % (ref 11.5–15.5)
WBC: 8.9 10*3/uL (ref 4.0–10.5)

## 2015-05-06 LAB — POCT URINALYSIS DIP (DEVICE)
Glucose, UA: NEGATIVE mg/dL
Nitrite: POSITIVE — AB
PH: 6 (ref 5.0–8.0)
Specific Gravity, Urine: 1.02 (ref 1.005–1.030)
Urobilinogen, UA: 1 mg/dL (ref 0.0–1.0)

## 2015-05-06 LAB — COMPLETE METABOLIC PANEL WITH GFR
ALT: 41 U/L — ABNORMAL HIGH (ref 6–29)
AST: 21 U/L (ref 10–35)
Albumin: 3.6 g/dL (ref 3.6–5.1)
Alkaline Phosphatase: 55 U/L (ref 33–115)
BILIRUBIN TOTAL: 1.1 mg/dL (ref 0.2–1.2)
BUN: 14 mg/dL (ref 7–25)
CHLORIDE: 101 mmol/L (ref 98–110)
CO2: 28 mmol/L (ref 20–31)
CREATININE: 0.61 mg/dL (ref 0.50–1.10)
Calcium: 8.1 mg/dL — ABNORMAL LOW (ref 8.6–10.2)
GFR, Est African American: >89 mL/min (ref >=60)
GFR, Est Non African American: >89 mL/min (ref >=60)
GLUCOSE: 83 mg/dL (ref 65–99)
Potassium: 4 mmol/L (ref 3.5–5.3)
SODIUM: 138 mmol/L (ref 135–146)
TOTAL PROTEIN: 6.1 g/dL (ref 6.1–8.1)

## 2015-05-06 LAB — HEMOGLOBIN A1C
HEMOGLOBIN A1C: 6.2 % — AB (ref <5.7)
MEAN PLASMA GLUCOSE: 131 mg/dL — AB (ref <117)

## 2015-05-06 MED ORDER — AMLODIPINE BESYLATE 5 MG PO TABS
5.0000 mg | ORAL_TABLET | Freq: Every day | ORAL | Status: DC
Start: 2015-05-06 — End: 2015-06-07

## 2015-05-06 MED ORDER — MONTELUKAST SODIUM 10 MG PO TABS: 10.0000 mg | ORAL_TABLET | Freq: Every day | ORAL | Status: DC

## 2015-05-06 NOTE — Patient Instructions (Addendum)
Titrate Hydralazine down: Down. Take 1 tablet daily for 1 week. Week 2 take 1/2 tablet daily.  Start Amlodipine 5 mg daily for hypertension Follow up in 1 month for hypertension  Increase water intake to 6-8 glasses per day  Increase exercise to 30 minutes 4-5 times per week.  Refrain from eating after 7:30 pm.   Will notify patient with lab results  Asthma, Adult Asthma is a recurring condition in which the airways tighten and narrow. Asthma can make it difficult to breathe. It can cause coughing, wheezing, and shortness of breath. Asthma episodes, also called asthma attacks, range from minor to life-threatening. Asthma cannot be cured, but medicines and lifestyle changes can help control it. CAUSES Asthma is believed to be caused by inherited (genetic) and environmental factors, but its exact cause is unknown. Asthma may be triggered by allergens, lung infections, or irritants in the air. Asthma triggers are different for each person. Common triggers include:   Animal dander.  Dust mites.  Cockroaches.  Pollen from trees or grass.  Mold.  Smoke.  Air pollutants such as dust, household cleaners, hair sprays, aerosol sprays, paint fumes, strong chemicals, or strong odors.  Cold air, weather changes, and winds (which increase molds and pollens in the air).  Strong emotional expressions such as crying or laughing hard.  Stress.  Certain medicines (such as aspirin) or types of drugs (such as beta-blockers).  Sulfites in foods and drinks. Foods and drinks that may contain sulfites include dried fruit, potato chips, and sparkling grape juice.  Infections or inflammatory conditions such as the flu, a cold, or an inflammation of the nasal membranes (rhinitis).  Gastroesophageal reflux disease (GERD).  Exercise or strenuous activity. SYMPTOMS Symptoms may occur immediately after asthma is triggered or many hours later. Symptoms include:  Wheezing.  Excessive nighttime or  early morning coughing.  Frequent or severe coughing with a common cold.  Chest tightness.  Shortness of breath. DIAGNOSIS  The diagnosis of asthma is made by a review of your medical history and a physical exam. Tests may also be performed. These may include:  Lung function studies. These tests show how much air you breathe in and out.  Allergy tests.  Imaging tests such as X-rays. TREATMENT  Asthma cannot be cured, but it can usually be controlled. Treatment involves identifying and avoiding your asthma triggers. It also involves medicines. There are 2 classes of medicine used for asthma treatment:   Controller medicines. These prevent asthma symptoms from occurring. They are usually taken every day.  Reliever or rescue medicines. These quickly relieve asthma symptoms. They are used as needed and provide short-term relief. Your health care provider will help you create an asthma action plan. An asthma action plan is a written plan for managing and treating your asthma attacks. It includes a list of your asthma triggers and how they may be avoided. It also includes information on when medicines should be taken and when their dosage should be changed. An action plan may also involve the use of a device called a peak flow meter. A peak flow meter measures how well the lungs are working. It helps you monitor your condition. HOME CARE INSTRUCTIONS   Take medicines only as directed by your health care provider. Speak with your health care provider if you have questions about how or when to take the medicines.  Use a peak flow meter as directed by your health care provider. Record and keep track of readings.  Understand and use  the action plan to help minimize or stop an asthma attack without needing to seek medical care.  Control your home environment in the following ways to help prevent asthma attacks:  Do not smoke. Avoid being exposed to secondhand smoke.  Change your heating and  air conditioning filter regularly.  Limit your use of fireplaces and wood stoves.  Get rid of pests (such as roaches and mice) and their droppings.  Throw away plants if you see mold on them.  Clean your floors and dust regularly. Use unscented cleaning products.  Try to have someone else vacuum for you regularly. Stay out of rooms while they are being vacuumed and for a short while afterward. If you vacuum, use a dust mask from a hardware store, a double-layered or microfilter vacuum cleaner bag, or a vacuum cleaner with a HEPA filter.  Replace carpet with wood, tile, or vinyl flooring. Carpet can trap dander and dust.  Use allergy-proof pillows, mattress covers, and box spring covers.  Wash bed sheets and blankets every week in hot water and dry them in a dryer.  Use blankets that are made of polyester or cotton.  Clean bathrooms and kitchens with bleach. If possible, have someone repaint the walls in these rooms with mold-resistant paint. Keep out of the rooms that are being cleaned and painted.  Wash hands frequently. SEEK MEDICAL CARE IF:   You have wheezing, shortness of breath, or a cough even if taking medicine to prevent attacks.  The colored mucus you cough up (sputum) is thicker than usual.  Your sputum changes from clear or white to yellow, green, gray, or bloody.  You have any problems that may be related to the medicines you are taking (such as a rash, itching, swelling, or trouble breathing).  You are using a reliever medicine more than 2-3 times per week.  Your peak flow is still at 50-79% of your personal best after following your action plan for 1 hour.  You have a fever. SEEK IMMEDIATE MEDICAL CARE IF:   You seem to be getting worse and are unresponsive to treatment during an asthma attack.  You are short of breath even at rest.  You get short of breath when doing very little physical activity.  You have difficulty eating, drinking, or talking due to  asthma symptoms.  You develop chest pain.  You develop a fast heartbeat.  You have a bluish color to your lips or fingernails.  You are light-headed, dizzy, or faint.  Your peak flow is less than 50% of your personal best.   This information is not intended to replace advice given to you by your health care provider. Make sure you discuss any questions you have with your health care provider.   Document Released: 07/16/2005 Document Revised: 04/06/2015 Document Reviewed: 02/12/2013 Elsevier Interactive Patient Education [employer]

## 2015-05-06 NOTE — Progress Notes (Signed)
Subjective:    Patient ID: Wendy James, female    DOB: 1970/12/26, 44 y.o.   MRN: [id number]  HPI  Asthma: Wendy James, a 44 year old female presents to establish care. Patient was recently hospitalized for an asthma exacerbation. She states that she typically has asthma exacerbation at least 2 times per year. She states that changes in weather typically triggers asthma exacerbation. She was discharged with a prednisone dose pack. She states that symptoms have improved since hospital discharge.  She currently denies chills, dyspnea, fever, myalgias, nasal congestion, productive cough, sore throat and sweats.Medications used in the past to treat asthma symptoms include beta agonist inhalers.  She also has a history of uncontrolled asthma. She states that she has been taking medications consistently. She does not check blood pressures at home and has not been following a low sodium diet or exercise regimen. She denies tobacco use. She also denies headache, fatigue, dizziness, chest pains or heart palpitations. She reports that she has been gaining weight consistently over the past several months.  Past Medical History  Diagnosis Date  . Asthma   . Seasonal allergies   . 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 Influenza Split 04/11/2012  . Influenza,inj,Quad PF,36+ Mos 04/30/2015  . Pneumococcal Polysaccharide-23 04/30/2015   Social History   Social History  . Marital Status: Married    Spouse Name: N/A  . Number of Children: N/A  . Years of Education: N/A   Occupational History  . Not on file.   Social History Main Topics  . Smoking status: Former Smoker -- 0.75 packs/day    Types: Cigarettes    Quit date: 11/17/1988  . Smokeless tobacco: Never Used  . Alcohol Use: No  . Drug Use: No  . Sexual Activity: Yes    Birth Control/ Protection: Surgical   Other Topics Concern  . Not on file   Social History Narrative    Allergies  Allergen Reactions  . Shrimp [Shellfish Allergy] Other (See Comments)    Wheezing.  Patient states she is ok with betadine   Weather or loud perfume.  Allergies.  Review of Systems     Objective:   Physical Exam  Constitutional: She appears well-developed and well-nourished.  Obese  HENT:  Head: Normocephalic and atraumatic.  Right Ear: Hearing, tympanic membrane, external ear and ear canal normal.  Left Ear: Hearing, tympanic membrane, external ear and ear canal normal.  Nose: Nose normal.  Mouth/Throat: Uvula is midline, oropharynx is clear and moist and mucous membranes are normal.  Eyes: Conjunctivae and EOM are normal. Pupils are equal, round, and reactive to light. Lids are everted and swept, no foreign bodies found.  Fundoscopic exam:      The right eye shows red reflex.       The left eye shows red reflex.  Neck: Trachea normal and normal range of motion. Neck supple.  Cardiovascular: Normal rate, regular rhythm, normal heart sounds and normal pulses.   Pulmonary/Chest: Effort normal and breath sounds normal. She has no decreased breath sounds.  Abdominal: Normal appearance. There is generalized tenderness.  Neurological: She is alert. She has normal strength. No cranial nerve deficit or sensory deficit. She displays a negative Romberg sign.  Skin: Skin is warm, dry and intact.  Psychiatric: She has a normal mood and affect. Her speech is normal and behavior is normal. Judgment and thought content normal. Cognition and memory  are normal.       BP 144/85 mmHg  Pulse 68  Temp(Src) 98.1 F (36.7 C) (Oral)  Resp 16  Ht  (1.575 m)  Wt 217 lb (98.431 kg)  BMI 39.68 kg/m2  LMP 05/06/2015 Assessment & Plan:  1. Mild intermittent asthma, uncomplicated Will add a leukotriene inhibitor to medication regimen to assist in preventing asthma exacerbations.  - montelukast (SINGULAIR) 10 MG tablet; Take 1 tablet (10 mg total) by mouth at bedtime.  Dispense: 30  tablet; Refill: 3  2. Essential hypertension Blood pressure is above goal on current medication regimen. Will titrate hydralazine down (take 50 mg daily on week 1, and 25 mg daily on week 2, 12.5 mg daily on week 3). Titrating down due to potential nonadherence.  Add daily amlodipine 5 mg. Will re-check blood pressure in 1 month. The patient is asked to make an attempt to improve diet and exercise patterns to aid in medical management of this problem. - amLODipine (NORVASC) 5 MG tablet; Take 1 tablet (5 mg total) by mouth daily.  Dispense: 30 tablet; Refill: 0 - POCT urinalysis dipstick  3. Healthcare maintenance Patient is up to date with mammogram and pap smear, reviewed medical records. Patient is up to date with influenza and pneumonia vaccinations - TSH - Hemoglobin A1c - COMPLETE METABOLIC PANEL WITH GFR - CBC with Differential  4. Obesity BMI is currently 39.6. Will screen for type 2 diabetes. Recommend a lowfat, low carbohydrate diet divided over 5-6 small meals, increase water intake to 6-8 glasses, and 150 minutes per week of cardiovascular exercise.    RTC: 1 month for hypertension  The patient was given clear instructions to go to ER or return to medical center if symptoms do not improve, worsen or new problems develop. The patient verbalized understanding. Will notify patient with laboratory results.  Massie Maroon, FNP

## 2015-05-07 LAB — TSH: TSH: 1.149 u[IU]/mL (ref 0.350–4.500)

## 2015-05-08 ENCOUNTER — Telehealth: Payer: Self-pay | Admitting: Family Medicine

## 2015-05-08 DIAGNOSIS — I10 Essential (primary) hypertension: Secondary | ICD-10-CM | POA: Insufficient documentation

## 2015-05-08 DIAGNOSIS — R7303 Prediabetes: Secondary | ICD-10-CM

## 2015-05-08 NOTE — Telephone Encounter (Signed)
Reviewed labs, hemoglobin a1C increased at 6.2 from 5.7 two years ago, which is indicative of type 2 diabetes mellitus. Recommend a lowfat, low carbohydrate diet divided over 5-6 small meals, increase water intake to 6-8 glasses, and 150 minutes per week of cardiovascular exercise.  Will re-check level in 3 months. Wendy James is to follow up as scheduled.   Massie Maroon, FNP

## 2015-05-09 NOTE — Telephone Encounter (Signed)
Left message for patient to call back regarding lab work. Thanks!  

## 2015-05-12 NOTE — Telephone Encounter (Signed)
Tried to call, no answer. Thanks!

## 2015-06-07 ENCOUNTER — Ambulatory Visit (INDEPENDENT_AMBULATORY_CARE_PROVIDER_SITE_OTHER): Payer: 59 | Admitting: Family Medicine

## 2015-06-07 ENCOUNTER — Encounter: Payer: Self-pay | Admitting: Family Medicine

## 2015-06-07 VITALS — BP 138/70 | HR 78 | Temp 98.2°F | Resp 16 | Ht 62.0 in | Wt 209.0 lb

## 2015-06-07 DIAGNOSIS — I10 Essential (primary) hypertension: Secondary | ICD-10-CM

## 2015-06-07 DIAGNOSIS — J4520 Mild intermittent asthma, uncomplicated: Secondary | ICD-10-CM

## 2015-06-07 LAB — POCT URINALYSIS DIP (DEVICE)
GLUCOSE, UA: NEGATIVE mg/dL
Leukocytes, UA: NEGATIVE
Nitrite: NEGATIVE
PROTEIN: 30 mg/dL — AB
Specific Gravity, Urine: >=1.03 (ref 1.005–1.030)
UROBILINOGEN UA: 0.2 mg/dL (ref 0.0–1.0)
pH: 5.5 (ref 5.0–8.0)

## 2015-06-07 MED ORDER — ALBUTEROL SULFATE HFA 108 (90 BASE) MCG/ACT IN AERS: 2.0000 | INHALATION_SPRAY | Freq: Four times a day (QID) | RESPIRATORY_TRACT | Status: DC | PRN

## 2015-06-07 MED ORDER — AMLODIPINE BESYLATE 5 MG PO TABS: 5.0000 mg | ORAL_TABLET | Freq: Every day | ORAL | Status: DC

## 2015-06-07 MED ORDER — MONTELUKAST SODIUM 10 MG PO TABS: 10.0000 mg | ORAL_TABLET | Freq: Every day | ORAL | Status: DC

## 2015-06-07 NOTE — Progress Notes (Signed)
Subjective:    Patient ID: Wendy James, female    DOB: 03/07/1971, 44 y.o.   MRN: [id number]  HPI Wendy James, a 44 year old female with a history of hypertension, asthma, and arthritis presents for a 1 month follow up after starting anti-hypertensive medication. She states that she is taking medication consistently.  She is exercising and is adherent to low salt diet.  Blood pressure is well controlled at home. Patient denies chest pain, dyspnea, fatigue, irregular heart beat, near-syncope, palpitations and tachypnea.   Past Medical History  Diagnosis Date  . Asthma   . Seasonal allergies   . 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 Marital Status: Married    Spouse Name: N/A  . Number of Children: N/A  . Years of Education: N/A   Occupational History  . Not on file.   Social History Main Topics  . Smoking status: Former Smoker -- 0.75 packs/day    Types: Cigarettes    Quit date: 11/17/1988  . Smokeless tobacco: Never Used  . Alcohol Use: No  . Drug Use: No  . Sexual Activity: Yes    Birth Control/ Protection: Surgical   Other Topics Concern  . Not on file   Social History Narrative   Review of Systems  Constitutional: Negative.  Negative for unexpected weight change.  HENT: Negative.   Eyes: Negative.   Respiratory: Negative.   Cardiovascular: Negative.   Gastrointestinal: Negative.   Endocrine: Negative.   Genitourinary: Negative.   Musculoskeletal: Negative.   Skin: Negative.   Allergic/Immunologic: Negative.   Neurological: Negative.   Hematological: Negative.   Psychiatric/Behavioral: Negative.  Negative for suicidal ideas and sleep disturbance.       Objective:   Physical Exam  Constitutional: She is oriented to person, place, and time. She appears well-developed and well-nourished.  HENT:  Head: Normocephalic and atraumatic.  Right Ear: External ear normal.  Left Ear: External ear normal.  Mouth/Throat:  Oropharynx is clear and moist.  Eyes: Conjunctivae and EOM are normal. Pupils are equal, round, and reactive to light.  Neck: Normal range of motion. Neck supple.  Cardiovascular: Normal rate, regular rhythm, normal heart sounds and intact distal pulses.   Pulmonary/Chest: Effort normal and breath sounds normal.  Abdominal: Soft. Bowel sounds are normal.  Musculoskeletal: Normal range of motion.  Neurological: She is alert and oriented to person, place, and time. She has normal reflexes.  Skin: Skin is warm and dry.  Psychiatric: She has a normal mood and affect. Her behavior is normal. Judgment and thought content normal.      BP 138/70 mmHg  Pulse 78  Temp(Src) 98.2 F (36.8 C) (Oral)  Resp 16  Ht 5\' 2"  (1.575 m)  Wt 209 lb (94.802 kg)  BMI 38.22 kg/m2  LMP 06/04/2015 Assessment & Plan:  1. Essential hypertension Blood pressure is at goal on current medication regimen. Patient has decreased weight by 9 pounds. She has been exercising consistently and following a low fat, low salt diet. Will continue Norvasc as previously prescribed and follow up in 1 month. Reviewed urinalysis, no proteinuria present.  - amLODipine (NORVASC) 5 MG tablet; Take 1 tablet (5 mg total) by mouth daily.  Dispense: 30 tablet; Refill: 5  2. Mild intermittent asthma, uncomplicated Asthma is controlled on current medication regimen. Will continue medications at current dosages.  - montelukast (SINGULAIR) 10 MG tablet; Take 1 tablet (10 mg total)  by mouth at bedtime.  Dispense: 30 tablet; Refill: 3 - albuterol (PROVENTIL HFA;VENTOLIN HFA) 108 (90 BASE) MCG/ACT inhaler; Inhale 2 puffs into the lungs every 6 (six) hours as needed for wheezing or shortness of breath.  Dispense: 1 Inhaler; Refill: 2  RTC: 3 months for hypertension Wendy Maroon, FNP

## 2015-07-10 ENCOUNTER — Encounter (HOSPITAL_COMMUNITY): Payer: Self-pay

## 2015-07-10 ENCOUNTER — Emergency Department (HOSPITAL_COMMUNITY)
Admission: EM | Admit: 2015-07-10 | Discharge: 2015-07-10 | Disposition: A | Discharge status: Home / Self Care | Payer: 59 | Attending: Emergency Medicine | Admitting: Emergency Medicine

## 2015-07-10 ENCOUNTER — Emergency Department (HOSPITAL_COMMUNITY): Payer: 59

## 2015-07-10 DIAGNOSIS — J069 Acute upper respiratory infection, unspecified: Secondary | ICD-10-CM | POA: Insufficient documentation

## 2015-07-10 DIAGNOSIS — J45901 Unspecified asthma with (acute) exacerbation: Secondary | ICD-10-CM

## 2015-07-10 DIAGNOSIS — Z79899 Other long term (current) drug therapy: Secondary | ICD-10-CM | POA: Insufficient documentation

## 2015-07-10 DIAGNOSIS — I10 Essential (primary) hypertension: Secondary | ICD-10-CM | POA: Insufficient documentation

## 2015-07-10 DIAGNOSIS — M170 Bilateral primary osteoarthritis of knee: Secondary | ICD-10-CM | POA: Diagnosis not present

## 2015-07-10 DIAGNOSIS — R05 Cough: Secondary | ICD-10-CM | POA: Diagnosis present

## 2015-07-10 MED ORDER — PREDNISONE 20 MG PO TABS
60.0000 mg | ORAL_TABLET | Freq: Once | ORAL | Status: AC
  Administered 2015-07-10: 60 mg via ORAL
  Filled 2015-07-10: qty 3

## 2015-07-10 MED ORDER — IPRATROPIUM BROMIDE 0.02 % IN SOLN
0.5000 mg | Freq: Once | RESPIRATORY_TRACT | Status: AC
  Administered 2015-07-10: 0.5 mg via RESPIRATORY_TRACT
  Filled 2015-07-10: qty 2.5

## 2015-07-10 MED ORDER — AZITHROMYCIN 250 MG PO TABS: 250.0000 mg | ORAL_TABLET | Freq: Every day | ORAL | Status: DC

## 2015-07-10 MED ORDER — ALBUTEROL SULFATE (2.5 MG/3ML) 0.083% IN NEBU
5.0000 mg | INHALATION_SOLUTION | Freq: Once | RESPIRATORY_TRACT | Status: AC
  Administered 2015-07-10: 5 mg via RESPIRATORY_TRACT
  Filled 2015-07-10: qty 6

## 2015-07-10 MED ORDER — PREDNISONE 10 MG PO TABS: 10.0000 mg | ORAL_TABLET | Freq: Every day | ORAL | Status: DC

## 2015-07-10 MED ORDER — GUAIFENESIN-CODEINE 100-10 MG/5ML PO SOLN: 5.0000 mL | Freq: Three times a day (TID) | ORAL | Status: DC | PRN

## 2015-07-10 NOTE — ED Notes (Signed)
Patient comes from home with c/o productive cough, wheezing, shortness of breath, chills, sore throat and nasal congestion x3 days.  Patient states she has used her albuterol inhaler with no relief.  Patient denies N/V/D.  Patient denies otalgia, chest pain and abdominal pain, but states she feels some chest tightness related to wheezing.  General: Awake, alert, well-nourished, NAD HEENT: Normal    Neck: No masses palpated.  Pain to palpation of right submandibular.  No lymph nodes palpated.     Eyes: Sclera white with no injection.  No drainage noted.    Nose: nasal turbinates moist    Throat: MMM, mild erythema to oropharynx Cardio: RRR, S1S2 heard, no murmurs appreciated Pulm: End expiratory wheezes in all lobes. Scattered rhonchi, no rales

## 2015-07-10 NOTE — ED Notes (Signed)
Patient's lung sounds are clear to ausculation after 2 breathing treatments.

## 2015-07-10 NOTE — ED Provider Notes (Signed)
CSN: [id number]     Arrival date & time 07/10/15  1021 History   First MD Initiated Contact with Patient 07/10/15 1102     Chief Complaint  Patient presents with  . URI     (Consider location/radiation/quality/duration/timing/severity/associated sxs/prior Treatment) HPI   Patient with a past medical history of hypertension, asthma, seasonal allergies and arthritis presents to the emergency department with URI and asthma exacerbation. She says that she has had a productive cough with wheezing, shortness of breath, chills, sore throat and his congestion for a week but worse in the past 3 days. She has not had any fever. She reports tightness to the chest with wheezing that is not improving significantly with her home nebulizer. She denies any apnea, choking, nausea, vomiting, diarrhea, chest pain, abdominal pain, lower chimney swelling. She states that she has exacerbations like this usually when the weather changing just cold and that this is her third exacerbation within the 12 month calendar year which is normal for her. She speaks in full sentences and appears to be in no respiratory distress  Past Medical History  Diagnosis Date  . Asthma   . Seasonal allergies   . 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 Cholecystectomy    . Cesarean section      x 4  . Tubal ligation    . Hysteroscopy w/d&c N/A 02/18/2015    Procedure: DILATATION AND CURETTAGE /HYSTEROSCOPY;  Surgeon: Allie BossierMyra C Dove, MD;  Location: [facility];  Service: Gynecology;  Laterality: N/A;   Family History  Problem Relation Age of Onset  . Hypertension Mother   . Stroke Sister   . Seizures Sister    Social History  Substance Use Topics  . Smoking status: Former Smoker -- 0.75 packs/day    Types: Cigarettes    Quit date: 11/17/1988  . Smokeless tobacco: Never Used  . Alcohol Use: No   OB History    Gravida Para Term Preterm AB TAB SAB Ectopic Multiple Living   5 4 4  0 1 0  1 0 0 3     Review of Systems  Refer to HPI for pertinent positive and negative ROS. Otherwise all other review of systems are negative for this patient encounter.   Allergies  Shrimp  Home Medications   Prior to Admission medications   Medication Sig Start Date End Date Taking? Authorizing Provider  albuterol (PROVENTIL HFA;VENTOLIN HFA) 108 (90 BASE) MCG/ACT inhaler Inhale 2 puffs into the lungs every 6 (six) hours as needed for wheezing or shortness of breath. 06/07/15  Yes Massie MaroonLachina M Hollis, FNP  albuterol (PROVENTIL) (2.5 MG/3ML) 0.083% nebulizer solution Take 2.5 mg by nebulization every 6 (six) hours as needed for wheezing or shortness of breath.   Yes Historical Provider, MD  amLODipine (NORVASC) 5 MG tablet Take 1 tablet (5 mg total) by mouth daily. 06/07/15  Yes Massie MaroonLachina M Hollis, FNP  diphenhydrAMINE (BENADRYL) 25 mg capsule Take 25 mg by mouth every other day.   Yes Historical Provider, MD  montelukast (SINGULAIR) 10 MG tablet Take 1 tablet (10 mg total) by mouth at bedtime. Patient taking differently: Take 10 mg by mouth every other day.  06/07/15  Yes Massie MaroonLachina M Hollis, FNP  azithromycin (ZITHROMAX) 250 MG tablet Take 1 tablet (250 mg total) by mouth daily. Take first 2 tablets together, then 1 every day until finished. 07/10/15   Joylyn Duggin Neva SeatGreene, PA-C  guaiFENesin (ROBITUSSIN) 100  MG/5ML liquid Take 5-10 mLs (100-200 mg total) by mouth every 4 (four) hours as needed for cough. Patient not taking: Reported on 03/24/2015 01/23/15   Francee Piccolo, PA-C  guaiFENesin-codeine 100-10 MG/5ML syrup Take 5 mLs by mouth 3 (three) times daily as needed for cough. 07/10/15   Kiyla Ringler Neva Seat, PA-C  hydrALAZINE (APRESOLINE) 50 MG tablet Take 1 tablet (50 mg total) by mouth every 8 (eight) hours. Patient not taking: Reported on 07/10/2015 05/03/15   Dorothea Ogle, MD  predniSONE (DELTASONE) 10 MG tablet Take 50 mg tablet today and taper down by 10 mg daily until completed Patient not taking:  Reported on 07/10/2015 05/03/15   Dorothea Ogle, MD  predniSONE (DELTASONE) 10 MG tablet Take 1 tablet (10 mg total) by mouth daily. 07/10/15   Marlon Pel, PA-C  traMADol (ULTRAM) 50 MG tablet Take 1 tablet (50 mg total) by mouth every 6 (six) hours as needed for moderate pain. Patient not taking: Reported on 07/10/2015 05/03/15   Dorothea Ogle, MD   BP 118/78 mmHg  Pulse 72  Temp(Src) 98.1 F (36.7 C) (Oral)  Resp 21  SpO2 100%  LMP 07/05/2015 (Exact Date) Physical Exam  Constitutional: She appears well-developed and well-nourished. No distress.  HENT:  Head: Normocephalic and atraumatic.  Right Ear: Tympanic membrane and ear canal normal.  Left Ear: Tympanic membrane and ear canal normal.  Nose: Nose normal.  Mouth/Throat: Uvula is midline, oropharynx is clear and moist and mucous membranes are normal.  Eyes: Pupils are equal, round, and reactive to light.  Neck: Normal range of motion. Neck supple.  Cardiovascular: Normal rate and regular rhythm.   Pulmonary/Chest: Effort normal. No accessory muscle usage. No apnea and no tachypnea. No respiratory distress.  Diffuse expiratory wheezing.  Abdominal: Soft.  No signs of abdominal distention  Musculoskeletal:  No LE swelling  Neurological: She is alert.  Acting at baseline  Skin: Skin is warm and dry. No rash noted.  Nursing note and vitals reviewed.   ED Course  Procedures (including critical care time) Labs Review Labs Reviewed - No data to display  Imaging Review Dg Chest 2 View  07/10/2015  CLINICAL DATA:  Acute chest pain and shortness of breath with cough for 4 days. History of asthma and bronchitis. EXAM: CHEST  2 VIEW COMPARISON:  04/27/2015 FINDINGS: The heart size and mediastinal contours are within normal limits. Both lungs are clear. The visualized skeletal structures are unremarkable. Prior cholecystectomy noted. IMPRESSION: No active cardiopulmonary disease. Electronically Signed   By: Judie Petit.  Shick M.D.   On:  07/10/2015 12:20   I have personally reviewed and evaluated these images and lab results as part of my medical decision-making.   EKG Interpretation None      MDM   Final diagnoses:  URI (upper respiratory infection)  Asthma exacerbation    Patient given prednisone and duonebulizers in the emergency department. She is sating at 100% on room air and her chest x-ray shows no significant cardiopulmonary findings. She was written a prescription for Z-Pak, prednisone and pt request cough syrup instructed only to use at night. I recommend close follow-up with her PCP.   I feel the patient has had an appropriate workup for their chief complaint at this time and likelihood of emergent condition existing is low. Discussed s/sx that warrant return to the ED.  Filed Vitals:   07/10/15 1051 07/10/15 1249  BP: 129/83 118/78  Pulse: 78 72  Temp: 98.4 F (36.9 C) 98.1 F (  36.7 C)  Resp: 22 [address], PA-C 07/11/15 1306  Marlon Pel, PA-C 07/11/15 1307  Tilden Fossa, MD 07/12/15 [phone]

## 2015-07-10 NOTE — ED Notes (Signed)
She c/o uri sx plus wheezing x 2 days.  She feels she is getting insufficient relief from her home nebulizer.  She is short of breath with audible wheezes and is short of breath, but is not dyspneic.

## 2015-07-10 NOTE — Discharge Instructions (Signed)

## 2015-08-17 ENCOUNTER — Emergency Department (HOSPITAL_COMMUNITY)
Admission: EM | Admit: 2015-08-17 | Discharge: 2015-08-17 | Disposition: A | Discharge status: Home / Self Care | Payer: 59 | Attending: Emergency Medicine | Admitting: Emergency Medicine

## 2015-08-17 ENCOUNTER — Emergency Department (HOSPITAL_COMMUNITY): Payer: 59

## 2015-08-17 ENCOUNTER — Encounter (HOSPITAL_COMMUNITY): Payer: Self-pay | Admitting: *Deleted

## 2015-08-17 DIAGNOSIS — M1990 Unspecified osteoarthritis, unspecified site: Secondary | ICD-10-CM | POA: Insufficient documentation

## 2015-08-17 DIAGNOSIS — M25511 Pain in right shoulder: Secondary | ICD-10-CM | POA: Insufficient documentation

## 2015-08-17 DIAGNOSIS — J45909 Unspecified asthma, uncomplicated: Secondary | ICD-10-CM | POA: Insufficient documentation

## 2015-08-17 DIAGNOSIS — R200 Anesthesia of skin: Secondary | ICD-10-CM | POA: Insufficient documentation

## 2015-08-17 DIAGNOSIS — Z87891 Personal history of nicotine dependence: Secondary | ICD-10-CM | POA: Insufficient documentation

## 2015-08-17 DIAGNOSIS — R531 Weakness: Secondary | ICD-10-CM | POA: Insufficient documentation

## 2015-08-17 DIAGNOSIS — Z79899 Other long term (current) drug therapy: Secondary | ICD-10-CM | POA: Insufficient documentation

## 2015-08-17 DIAGNOSIS — M546 Pain in thoracic spine: Secondary | ICD-10-CM | POA: Insufficient documentation

## 2015-08-17 MED ORDER — CYCLOBENZAPRINE HCL 10 MG PO TABS: 10.0000 mg | ORAL_TABLET | Freq: Two times a day (BID) | ORAL | Status: DC | PRN

## 2015-08-17 MED ORDER — NAPROXEN 375 MG PO TABS: 375.0000 mg | ORAL_TABLET | Freq: Two times a day (BID) | ORAL | Status: DC

## 2015-08-17 MED ORDER — KETOROLAC TROMETHAMINE 30 MG/ML IJ SOLN
30.0000 mg | Freq: Once | INTRAMUSCULAR | Status: AC
  Administered 2015-08-17: 30 mg via INTRAMUSCULAR
  Filled 2015-08-17: qty 1

## 2015-08-17 MED ORDER — CYCLOBENZAPRINE HCL 10 MG PO TABS
10.0000 mg | ORAL_TABLET | Freq: Once | ORAL | Status: AC
  Administered 2015-08-17: 10 mg via ORAL
  Filled 2015-08-17: qty 1

## 2015-08-17 NOTE — ED Notes (Addendum)
Pt complains of pain in her right neck radiating to right arm for the past month. Pt states she has had periods of numbness followed by aching in her right hand for the past 2 weeks. Pt states today she was fixing food and her right hand went numb while she was preparing food.. Pt states the feeling has come back in her hand but that it is still aching. Pt denies dizziness, headache.

## 2015-08-17 NOTE — ED Provider Notes (Signed)
CSN: [id number]     Arrival date & time 08/17/15  1500 History   First MD Initiated Contact with Patient 08/17/15 1728     Chief Complaint  Patient presents with  . Arm Pain  . Numbness     (Consider location/radiation/quality/duration/timing/severity/associated sxs/prior Treatment) HPI   Patient is a 73 her old female past medical history of asthma and arthritis who presents to the ED with complaint of right shoulder pain. Patient reports she has been having pain for the past month. She reports having a constant dull ache to her right shoulder and upper back that radiates down her bicep. Denies any fall, trauma, injury. She also endorses having intermittent periods of numbness to her right hand that last for approximately 5 minutes. 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 She notes today while she was at work she had numbness that lasted approximately 10 minutes and then resolved spontaneously. Pt also endorses intermittent weakness to her right arm that occur while she is at work. Pt reports doing repetitive movements with her right arm at work that include chopping food and using a heavy meat cutter. She states she has been using Tylenol, her husband's muscle relaxant and heat with mild relief. Denies fever, neck pain, neck stiffness, shortness of breath, lightheadedness, dizziness, headache, visual changes, swelling.  Past Medical History  Diagnosis Date  . Asthma   . Seasonal allergies   . 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 Cholecystectomy    . Cesarean section      x 4  . Tubal ligation    . Hysteroscopy w/d&c N/A 02/18/2015    Procedure: DILATATION AND CURETTAGE /HYSTEROSCOPY;  Surgeon: Allie Bossier, MD;  Location: [facility];  Service: Gynecology;  Laterality: N/A;   Family History  Problem Relation Age of Onset  . Hypertension Mother   . Stroke Sister   . Seizures Sister     Social History  Substance Use Topics  . Smoking status: Former Smoker -- 0.75 packs/day    Types: Cigarettes    Quit date: 11/17/1988  . Smokeless tobacco: Never Used  . Alcohol Use: No   OB History    Gravida Para Term Preterm AB TAB SAB Ectopic Multiple Living   0 1 0 1 0 0 3     Review of Systems  Musculoskeletal: Positive for arthralgias (right shoulder).  Neurological: Positive for weakness and numbness.  All other systems reviewed and are negative.     Allergies  Shrimp  Home Medications   Prior to Admission medications   Medication Sig Start Date End Date Taking? Authorizing Provider  albuterol (PROVENTIL HFA;VENTOLIN HFA) 108 (90 BASE) MCG/ACT inhaler Inhale 2 puffs into the lungs every 6 (six) hours as needed for wheezing or shortness of breath. 06/07/15  Yes Massie Maroon, FNP  amLODipine (NORVASC) 5 MG tablet Take 1 tablet (5 mg total) by mouth daily. 06/07/15  Yes Massie Maroon, FNP  montelukast (SINGULAIR) 10 MG tablet Take 1 tablet (10 mg total) by mouth at bedtime. Patient taking differently: Take 10 mg by mouth at bedtime as needed (allergies).  06/07/15  Yes Massie Maroon, FNP  albuterol (PROVENTIL) (2.5 MG/3ML) 0.083% nebulizer solution Take 2.5 mg by nebulization every 6 (six) hours as needed for wheezing or shortness of breath. Reported on 08/17/2015    Historical Provider,  MD  azithromycin (ZITHROMAX) 250 MG tablet Take 1 tablet (250 mg total) by mouth daily. Take first 2 tablets together, then 1 every day until finished. Patient not taking: Reported on 08/17/2015 07/10/15   Marlon Pel, PA-C  cyclobenzaprine (FLEXERIL) 10 MG tablet Take 1 tablet (10 mg total) by mouth 2 (two) times daily as needed for muscle spasms. 08/17/15   Barrett Henle, PA-C  guaiFENesin (ROBITUSSIN) 100 MG/5ML liquid Take 5-10 mLs (100-200 mg total) by mouth every 4 (four) hours as needed for cough. Patient not taking: Reported on 03/24/2015 01/23/15   Francee Piccolo, PA-C  guaiFENesin-codeine 100-10 MG/5ML syrup Take 5 mLs by mouth 3 (three) times daily as needed for cough. Patient not taking: Reported on 08/17/2015 07/10/15   Marlon Pel, PA-C  hydrALAZINE (APRESOLINE) 50 MG tablet Take 1 tablet (50 mg total) by mouth every 8 (eight) hours. Patient not taking: Reported on 07/10/2015 05/03/15   Dorothea Ogle, MD  naproxen (NAPROSYN) 375 MG tablet Take 1 tablet (375 mg total) by mouth 2 (two) times daily. 08/17/15   Barrett Henle, PA-C  predniSONE (DELTASONE) 10 MG tablet Take 50 mg tablet today and taper down by 10 mg daily until completed Patient not taking: Reported on 07/10/2015 05/03/15   Dorothea Ogle, MD  predniSONE (DELTASONE) 10 MG tablet Take 1 tablet (10 mg total) by mouth daily. Patient not taking: Reported on 08/17/2015 07/10/15   Marlon Pel, PA-C  traMADol (ULTRAM) 50 MG tablet Take 1 tablet (50 mg total) by mouth every 6 (six) hours as needed for moderate pain. Patient not taking: Reported on 07/10/2015 05/03/15   Dorothea Ogle, MD   BP 138/97 mmHg  Pulse 65  Temp(Src) 98.2 F (36.8 C) (Oral)  Resp 18  SpO2 100%  LMP 07/17/2015 Physical Exam  Constitutional: She is oriented to person, place, and time. She appears well-developed and well-nourished. No distress.  HENT:  Head: Normocephalic and atraumatic.  Eyes: Conjunctivae and EOM are normal. Right eye exhibits no discharge. Left eye exhibits no discharge. No scleral icterus.  Neck: Normal range of motion. Neck supple. Carotid bruit is not present.  Cardiovascular: Normal rate, regular rhythm, normal heart sounds and intact distal pulses.   Pulmonary/Chest: Effort normal and breath sounds normal. No respiratory distress. She has no wheezes. She has no rales. She exhibits no tenderness.  Abdominal: Soft. Bowel sounds are normal. She exhibits no distension. There is no tenderness.  Musculoskeletal: Normal range of motion. She exhibits no edema.       Right  shoulder: She exhibits tenderness. She exhibits normal range of motion, no swelling, no effusion, no crepitus, no deformity, no laceration, normal pulse and normal strength.  FROM of BUE with reported pain in right shoulder with full flexion and abduction. 5/5 strength of BUE. Sensation grossly intact. 2+ radial pulses. Cap refill <2. TTP at right upper trapezius, deltoid and bicep.   No cervical midline tenderness. FROM of neck.   Lymphadenopathy:    She has no cervical adenopathy.  Neurological: She is alert and oriented to person, place, and time.  Skin: Skin is warm and dry. She is not diaphoretic.  Nursing note and vitals reviewed.   ED Course  Procedures (including critical care time) Labs Review Labs Reviewed - No data to display  Imaging Review Dg Shoulder Right  08/17/2015  CLINICAL DATA:  Right shoulder pain for 1 month. EXAM: RIGHT SHOULDER - 2+ VIEW COMPARISON:  None. FINDINGS: There is no evidence  of fracture or dislocation. There is no evidence of arthropathy or other focal bone abnormality. Soft tissues are unremarkable. IMPRESSION: Negative. Electronically Signed   By: Irish Lack M.D.   On: 08/17/2015 18:41   I have personally reviewed and evaluated these images and lab results as part of my medical decision-making.  Filed Vitals:   08/17/15 1505 08/17/15 1803  BP: 159/86 138/97  Pulse: 72 65  Temp: 98.2 F (36.8 C)   Resp: 18 18     MDM   Final diagnoses:  Right shoulder pain    Pt presents with right shoulder pain and intermittent numbness. Denies fall, trauma, injury. Endorses doing repetitive movements at work with her right arm. Mild relief with tylenol, heat and muscle relaxants at home. VSS. Exam revealed pain with full flexion and abduction of right shoulder, sensation grossly intact during exam, TTP at right upper trapezius, deltoid and bicep, right arm otherwise neurovascularly intact. Right shoulder xray negative. I suspect pt's sxs are likely due  to muscle strain associated with repetitive motions reported by pt at her work. Plan to d/c pt home with NSAIDs, muscle relaxant and discussed symptomatic tx. Advised pt to followup with her PCP.  Evaluation does not show pathology requring ongoing emergent intervention or admission. Pt is hemodynamically stable and mentating appropriately. Discussed findings/results and plan with patient/guardian, who agrees with plan. All questions answered. Return precautions discussed and outpatient follow up given.      Satira Sark [place], [place] 08/17/15 1922  Benjiman Core, MD 08/18/15 Jacinta Shoe

## 2015-08-17 NOTE — Discharge Instructions (Signed)
Take your medications as prescribed. I also recommend applying ice for 15-20 minutes reported on file for pain relief. Follow-up with your primary care provider in the next 4-5 days. Return to the emergency department if symptoms worsen or new onset of fever, swelling, redness, numbness, weakness, neck pain/stiffness.    Emergency Department Resource Guide 1) Find a Doctor and Pay Out of Pocket Although you won't have to find out who is covered by your insurance plan, it is a good idea to ask around and get recommendations. You will then need to call the office and see if the doctor you have chosen will accept you as a new patient and what types of options they offer for patients who are self-pay. Some doctors offer discounts or will set up payment plans for their patients who do not have insurance, but you will need to ask so you aren't surprised when you get to your appointment.  2) Contact Your Local Health Department Not all health departments have doctors that can see patients for sick visits, but many do, so it is worth a call to see if yours does. If you don't know where your local health department is, you can check in your phone book. The CDC also has a tool to help you locate your state's health department, and many state websites also have listings of all of their local health departments.  3) Find a Walk-in Clinic If your illness is not likely to be very severe or complicated, you may want to try a walk in clinic. These are popping up all over the country in pharmacies, drugstores, and shopping centers. They're usually staffed by nurse practitioners or physician assistants that have been trained to treat common illnesses and complaints. They're usually fairly quick and inexpensive. However, if you have serious medical issues or chronic medical problems, these are probably not your best option.  No Primary Care Doctor: - Call Health Connect at  [phone] - they can help you locate a primary  care doctor that  accepts your insurance, provides certain services, etc. - Physician Referral Service- [phone]  Chronic Pain Problems: Organization         Address  Phone   Notes  [name]  [phone] Patients need to be referred by their primary care doctor.   Medication Assistance: Organization         Address  Phone   Notes  [facility] Medication [facility] [address] [place] [place], [place] [address] [phone] --Must be a resident of [facility] -- Must have NO insurance coverage whatsoever (no Medicaid/ Medicare, etc.) -- The pt. MUST have a primary care doctor that directs their care regularly and follows them in the community   [facility]  [phone]   [employer]  [phone]    Agencies that provide inexpensive medical care: Organization         Address  Phone   Notes  [facility]  [phone]   [facility]    [phone]   [facility] [address] [place], [place] [address] [phone]   [facility] [address], [place] [phone]   [facility]anned Parenthood    [phone]   [facility]    [phone]   [facility]  [address], [place] Phone:  [phone], Fax:  [phone] Hours of Operation:  9  am - 6 pm, M-F.  Also accepts Medicaid/Medicare and self-pay.  [facility]  [address], [place] Phone: [phone], Fax: [phone]) [phone]. Hours of Operation:  8:30 am - 5:30 pm, M-F.  Also accepts Medicaid and self-pay.  [facility] [place] [address], [place] Phone: [phone]   [facility] [address], [place], [place] [phone], Ext. [phone] Mondays & Thursdays: 7-9 AM.  First 15 patients are seen on a first come, first serve basis.    Medicaid-accepting [facility]  Providers:  Organization         Address  Phone   Notes  [facility] [address], [place] [phone]) [phone] Also accepts self-pay patients.  [facility] [address] [place] [address], [place]  [phone]   [facility] [address], [place] [phone]) [phone]   [facility] [address] [place] [address], [place] [phone]   Renaye RakersVeita Bland [address], [place]   [phone] Only accepts [place] Access [place] patients after they have their name applied to their card.   Self-Pay (no insurance) in [facility] [place]:  Organization         Address  Phone   Notes  Sickle Cell Patients, [facility] [address], [place] [phone]) [phone]   [facility] [address], [place] [phone]) [phone]   [facility] [place]  [address], [place] [phone]) [phone]   [facility]/Dr. Osei-Bonsu  [address] [place] [address], [place] or [phone], [place] [phone] Phone number for both [place] and [place] locations is the same.  [facility] [address], [place] [phone]) [phone]   [facility] [address] [place] [address], [place] or [address] [phone]) [phone]   [facility] [address], [place] [phone]) [phone], phone; [phone]) [phone], fax Sees patients 1st and 3rd Saturday of every month.  Must not qualify for public or private insurance (i.e. Medicaid, Medicare, [place] Health Choice, Veterans' Benefits)  Household income should be no more than 200% of the poverty level The clinic cannot treat you if you are pregnant or think you are pregnant  Sexually transmitted diseases are not treated at the clinic.    Dental Care: Organization         Address  Phone  Notes  [facility] [address], [place] [phone] Accepts children up to age 47 who are enrolled in [place] or [place] Health Choice; pregnant women with a Medicaid card; and children who have applied for Medicaid or [place] Health Choice, but were declined, whose parents can pay a reduced fee at time of service.  [facility] [place]  [address], [place] [phone] Accepts children up to age 47 who are enrolled in [place] or [place] Health Choice; pregnant women with a Medicaid card; and children who have applied for Medicaid or [place] Health Choice, but were declined, whose parents can pay a reduced fee at time of service.  [facility]  [address], [place] [phone]) [phone] Patients are seen by appointment only. Walk-ins are not accepted. [facility] will see patients 18 years of  age and older. °Monday - Tuesday (8am-5pm) °Most Wednesdays (8:30-5pm) °$30 per visit, cash only  °[facility] ° [address], [place] [phone] Patients are seen by appointment only. Walk-ins are not accepted. [facility] will see patients 18 years of age and older. °One Wednesday Evening (Monthly: Volunteer Based).  $30 per visit, cash only  °[facility]  [phone] for adults; Children under age 4, call Graduate Pediatric Dentistry at [phone]. Children aged 4-14, please call [phone] to request a pediatric application. ° Dental services are provided in all areas of dental care including fillings, crowns and bridges, complete and partial dentures, implants, gum treatment, root canals, and extractions. Preventive care is also provided. Treatment is provided to both adults and children. °Patients are selected via a lottery and there is often a waiting list. °  °[facility] [address], °[place] ° [phone] [email or web address] °  °[facility]  [address], [place], [place] [phone], Ext. [phone] Second and Fourth Thursday of each month, opens at 6:30 AM; Clinic ends at 9 AM.  Patients are seen on a first-come first-served basis, and a limited number are seen during each clinic.  ° °[facility] ° [address], [place], [place] [phone]   Eligibility Requirements °You must have lived in [place], [place], or [place] counties for at least the last three months. °  You cannot be eligible for state or federal sponsored healthcare insurance, including Veterans Administration, Medicaid, or Medicare. °  You generally cannot be eligible for healthcare insurance through your employer.  °  How to apply: °Eligibility screenings are held every Tuesday and Wednesday afternoon from 1:00 pm until 4:00 pm. You do not need an appointment for the interview!  °[facility] [address], [place], [place] [phone]   °[facility]  336-342-8273   °[facility]  336-703-3100   °[facility]  336-570-6415   ° °Behavioral Health Resources in the Community: °Intensive Outpatient Programs °Organization         Address  Phone  Notes  °[place] [facility] [address], [place], [place] [phone]   °[facility] [address], [place], [place] [phone]   °[facility] [address], [place], [place] ° 336-882-2125   °[facility] [address],  °[place], [place] 1-800-853-5163 or 336-641-4981   °Substance Abuse Resources °Organization         Address  Phone  Notes  °[facility]  336-882-2125   °[facility]  336-784-9470   °[facility]  336-285-9073   °[facility]  336-845-3988   °Residential & Outpatient Substance Abuse Program  1-800-659-3381   °Psychological Services °Organization         Address  Phone  Notes  °[facility]  336- 832-9600    °[facility]  336- 378-7881   °[facility] [address], [place] 1-800-853-5163 or 336-641-4981   ° °Mobile Crisis Teams °Organization         Address  Phone  Notes  °[facility], Mobile Crisis Care Unit  1-877-626-1772   °Assertive °Psychotherapeutic Services ° [address] [place], [place] 336-834-9664   °Sharon DeEsch [address] °[place] 336-554-5454   ° °Self-Help/Support Groups °Organization           Address  Phone             Notes  [facility] [place] - variety of support groups  336- I7437963 Call for more information  Narcotics Anonymous (NA), [facility] [address], [employer] [place]  2 meetings at this location   Statistician         Address  Phone  Notes  [facility] [address],    [place]  [phone]   [facility]  [address], [place] [address], [place], [place] [phone]   [facility] [address] [place], [place] [phone] Admissions: 8am-3pm M-F  [facility] [address]    [place], [place] [phone]   [facility] [address] [place], [place], [place] [phone]   [facility] [address],  [place], [place] [phone]   [facility] Intensive Outpatient [address], [place], [place] [phone]   [facility] ([facility] [address] [place].,  [place], [place] 3-295-188-4166 or [phone]   [facility] ([facility]) [address], [place], [place] [phone] Accepts Medicaid  [facility] [address],  [place] 2-542-706-2376 Substance Abuse/Addiction Treatment   [facility] Organization         Address  Phone  Notes  [facility]  [phone]   Angie Fava, PhD [address] [place], [place]   [phone] or [phone]    [facility]   [address] [place], [place] [phone]   [facility] [address] [place], [place] [phone]) [phone] Insurance/Medicaid/sponsorship through [facility] and Families [address]                                    [place], [place] [phone] Therapy/tele-psych/case  [facility] [address], [place] [phone]    Dr. Lolly Mustache  [phone]   [facility]  [facility] Dept. 1) [address] [place] [address] [place] 2) [address], [place] 3)  [address], [place] [phone]  [phone]   [facility] Child Abuse Hotline 504 888 8667 or [phone] (After Hours)

## 2015-09-08 ENCOUNTER — Ambulatory Visit: Payer: 59 | Admitting: Family Medicine

## 2015-09-28 MED FILL — VENTOLIN HFA 90 MCG INHALER: 108 (90 BAS | 25 days supply | Qty: 18 | Fill #2

## 2015-09-28 MED FILL — ?MONTELUKAST SOD 10 MG TAB: 10 | 30 days supply | Qty: 30 | Fill #1

## 2015-11-01 MED FILL — !VENTOLIN HFA INHALER: 108 (90 BAS | 25 days supply | Qty: 18 | Fill #0

## 2015-11-02 ENCOUNTER — Encounter (HOSPITAL_COMMUNITY): Payer: Self-pay

## 2015-11-02 ENCOUNTER — Emergency Department (HOSPITAL_COMMUNITY)
Admission: EM | Admit: 2015-11-02 | Discharge: 2015-11-02 | Disposition: A | Discharge status: Home / Self Care | Payer: 59 | Attending: Emergency Medicine | Admitting: Emergency Medicine

## 2015-11-02 ENCOUNTER — Emergency Department (HOSPITAL_COMMUNITY): Payer: 59

## 2015-11-02 DIAGNOSIS — M170 Bilateral primary osteoarthritis of knee: Secondary | ICD-10-CM | POA: Insufficient documentation

## 2015-11-02 DIAGNOSIS — J45901 Unspecified asthma with (acute) exacerbation: Secondary | ICD-10-CM | POA: Insufficient documentation

## 2015-11-02 DIAGNOSIS — Z79899 Other long term (current) drug therapy: Secondary | ICD-10-CM | POA: Insufficient documentation

## 2015-11-02 DIAGNOSIS — Z791 Long term (current) use of non-steroidal anti-inflammatories (NSAID): Secondary | ICD-10-CM | POA: Insufficient documentation

## 2015-11-02 DIAGNOSIS — Z87891 Personal history of nicotine dependence: Secondary | ICD-10-CM | POA: Insufficient documentation

## 2015-11-02 MED ORDER — ALBUTEROL SULFATE (2.5 MG/3ML) 0.083% IN NEBU: 2.5000 mg | INHALATION_SOLUTION | Freq: Four times a day (QID) | RESPIRATORY_TRACT | Status: DC | PRN

## 2015-11-02 MED ORDER — DEXAMETHASONE 4 MG PO TABS
12.0000 mg | ORAL_TABLET | Freq: Once | ORAL | Status: AC
  Administered 2015-11-02: 12 mg via ORAL
  Filled 2015-11-02: qty 3

## 2015-11-02 NOTE — Discharge Instructions (Signed)
Bronchospasm, Adult  A bronchospasm is a spasm or tightening of the airways going into the lungs. During a bronchospasm breathing becomes more difficult because the airways get smaller. When this happens there can be coughing, a whistling sound when breathing (wheezing), and difficulty breathing. Bronchospasm is often associated with asthma, but not all patients who experience a bronchospasm have asthma.  CAUSES   A bronchospasm is caused by inflammation or irritation of the airways. The inflammation or irritation may be triggered by:   · Allergies (such as to animals, pollen, food, or mold). Allergens that cause bronchospasm may cause wheezing immediately after exposure or many hours later.    · Infection. Viral infections are believed to be the most common cause of bronchospasm.    · Exercise.    · Irritants (such as pollution, cigarette smoke, strong odors, aerosol sprays, and paint fumes).    · Weather changes. Winds increase molds and pollens in the air. Rain refreshes the air by washing irritants out. Cold air may cause inflammation.    · Stress and emotional upset.    SIGNS AND SYMPTOMS   · Wheezing.    · Excessive nighttime coughing.    · Frequent or severe coughing with a simple cold.    · Chest tightness.    · Shortness of breath.    DIAGNOSIS   Bronchospasm is usually diagnosed through a history and physical exam. Tests, such as chest X-rays, are sometimes done to look for other conditions.  TREATMENT   · Inhaled medicines can be given to open up your airways and help you breathe. The medicines can be given using either an inhaler or a nebulizer machine.  · Corticosteroid medicines may be given for severe bronchospasm, usually when it is associated with asthma.  HOME CARE INSTRUCTIONS   · Always have a plan prepared for seeking medical care. Know when to call your health care provider and local emergency services (911 in the U.S[place] Know where you can access local emergency care.  · Only take medicines as  directed by your health care provider.  · If you were prescribed an inhaler or nebulizer machine, ask your health care provider to explain how to use it correctly. Always use a spacer with your inhaler if you were given one.  · It is necessary to remain calm during an attack. Try to relax and breathe more slowly.   · Control your home environment in the following ways:      Change your heating and air conditioning filter at least once a month.      Limit your use of fireplaces and wood stoves.    Do not smoke and do not allow smoking in your home.      Avoid exposure to perfumes and fragrances.      Get rid of pests (such as roaches and mice) and their droppings.      Throw away plants if you see mold on them.      Keep your house clean and dust free.      Replace carpet with wood, tile, or vinyl flooring. Carpet can trap dander and dust.      Use allergy-proof pillows, mattress covers, and box spring covers.      Wash bed sheets and blankets every week in hot water and dry them in a dryer.      Use blankets that are made of polyester or cotton.      Wash hands frequently.  SEEK MEDICAL CARE IF:   · You have muscle aches.    · You have chest pain.    · The sputum changes from clear or   white to yellow, green, gray, or bloody.    · The sputum you cough up gets thicker.    · There are problems that may be related to the medicine you are given, such as a rash, itching, swelling, or trouble breathing.    SEEK IMMEDIATE MEDICAL CARE IF:   · You have worsening wheezing and coughing even after taking your prescribed medicines.    · You have increased difficulty breathing.    · You develop severe chest pain.  MAKE SURE YOU:   · Understand these instructions.  · Will watch your condition.  · Will get help right away if you are not doing well or get worse.     This information is not intended to replace advice given to you by your health care provider. Make sure you discuss any questions you have with your health care  provider.     Document Released: 07/19/2003 Document Revised: 08/06/2014 Document Reviewed: 01/05/2013  Elsevier Interactive Patient Education ©2016 [employer] Inc.

## 2015-11-02 NOTE — ED Notes (Signed)
PT DISCHARGED. INSTRUCTIONS AND PRESCRIPTION GIVEN. AAOX3. PT IN NO APPARENT DISTRESS OR PAIN. THE OPPORTUNITY TO ASK QUESTIONS WAS PROVIDED. 

## 2015-11-02 NOTE — ED Provider Notes (Signed)
CSN: [id number]     Arrival date & time 11/02/15  4098 History   First MD Initiated Contact with Patient 11/02/15 1038     Chief Complaint  Patient presents with  . Shortness of Breath     (Consider location/radiation/quality/duration/timing/severity/associated sxs/prior Treatment) HPI   45 year old female with dyspnea. She has a past history of asthma. She reports increasing shortness of breath since this morning. Denies any pain. Feels like she does have an asthma exacerbation. She tried her rescue inhaler without significant relief. EMS was called. In route, she had 10 mg of albuterol, 125 mg of Solu-Medrol and 1 mg of ipratropium. She now reports significant improvement. Denies any fevers or chills. No unusual leg pain or swelling. Reports frequent exacerbations in the spring when there is a lot of pollen.  Past Medical History  Diagnosis Date  . Asthma   . Seasonal allergies   . 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 Cholecystectomy    . Cesarean section      x 4  . Tubal ligation    . Hysteroscopy w/d&c N/A 02/18/2015    Procedure: DILATATION AND CURETTAGE /HYSTEROSCOPY;  Surgeon: Allie Bossier, MD;  Location: [facility];  Service: Gynecology;  Laterality: N/A;   Family History  Problem Relation Age of Onset  . Hypertension Mother   . Stroke Sister   . Seizures Sister    Social History  Substance Use Topics  . Smoking status: Former Smoker -- 0.75 packs/day    Types: Cigarettes    Quit date: 11/17/1988  . Smokeless tobacco: Never Used  . Alcohol Use: No   OB History    Gravida Para Term Preterm AB TAB SAB Ectopic Multiple Living   0 1 0 1 0 0 3     Review of Systems  All systems reviewed and negative, other than as noted in HPI.   Allergies  Shrimp  Home Medications   Prior to Admission medications   Medication Sig Start Date End Date Taking? Authorizing Provider  albuterol (PROVENTIL HFA;VENTOLIN HFA) 108  (90 BASE) MCG/ACT inhaler Inhale 2 puffs into the lungs every 6 (six) hours as needed for wheezing or shortness of breath. 06/07/15   Massie Maroon, FNP  albuterol (PROVENTIL) (2.5 MG/3ML) 0.083% nebulizer solution Take 2.5 mg by nebulization every 6 (six) hours as needed for wheezing or shortness of breath. Reported on 08/17/2015    Historical Provider, MD  amLODipine (NORVASC) 5 MG tablet Take 1 tablet (5 mg total) by mouth daily. 06/07/15   Massie Maroon, FNP  azithromycin (ZITHROMAX) 250 MG tablet Take 1 tablet (250 mg total) by mouth daily. Take first 2 tablets together, then 1 every day until finished. Patient not taking: Reported on 08/17/2015 07/10/15   Marlon Pel, PA-C  cyclobenzaprine (FLEXERIL) 10 MG tablet Take 1 tablet (10 mg total) by mouth 2 (two) times daily as needed for muscle spasms. 08/17/15   Barrett Henle, PA-C  guaiFENesin (ROBITUSSIN) 100 MG/5ML liquid Take 5-10 mLs (100-200 mg total) by mouth every 4 (four) hours as needed for cough. Patient not taking: Reported on 03/24/2015 01/23/15   Francee Piccolo, PA-C  guaiFENesin-codeine 100-10 MG/5ML syrup Take 5 mLs by mouth 3 (three) times daily as needed for cough. Patient not taking: Reported on 08/17/2015 07/10/15   Marlon Pel, PA-C  hydrALAZINE (APRESOLINE) 50 MG tablet Take 1 tablet (50 mg total) by  mouth every 8 (eight) hours. Patient not taking: Reported on 07/10/2015 05/03/15   Dorothea OgleIskra M Myers, MD  montelukast (SINGULAIR) 10 MG tablet Take 1 tablet (10 mg total) by mouth at bedtime. Patient taking differently: Take 10 mg by mouth at bedtime as needed (allergies).  06/07/15   Massie MaroonLachina M Hollis, FNP  naproxen (NAPROSYN) 375 MG tablet Take 1 tablet (375 mg total) by mouth 2 (two) times daily. 08/17/15   Barrett HenleNicole Elizabeth Nadeau, PA-C  predniSONE (DELTASONE) 10 MG tablet Take 50 mg tablet today and taper down by 10 mg daily until completed Patient not taking: Reported on 07/10/2015 05/03/15   Dorothea OgleIskra M Myers, MD   predniSONE (DELTASONE) 10 MG tablet Take 1 tablet (10 mg total) by mouth daily. Patient not taking: Reported on 08/17/2015 07/10/15   Marlon Peliffany Greene, PA-C  traMADol (ULTRAM) 50 MG tablet Take 1 tablet (50 mg total) by mouth every 6 (six) hours as needed for moderate pain. Patient not taking: Reported on 07/10/2015 05/03/15   Dorothea OgleIskra M Myers, MD   BP 153/76 mmHg  Pulse 70  Temp(Src) 98.4 F (36.9 C) (Oral)  Resp 14  SpO2 97% Physical Exam  Constitutional: She appears well-developed and well-nourished. No distress.  HENT:  Head: Normocephalic and atraumatic.  Eyes: Conjunctivae are normal. Right eye exhibits no discharge. Left eye exhibits no discharge.  Neck: Neck supple.  Cardiovascular: Normal rate, regular rhythm and normal heart sounds.  Exam reveals no gallop and no friction rub.   No murmur heard. Pulmonary/Chest: No respiratory distress.  Sitting in bed. Work of breathing is not significantly increased. Can carry on conversation. Mild expiratory wheezing bilaterally.  Abdominal: Soft. She exhibits no distension. There is no tenderness.  Musculoskeletal: She exhibits no edema or tenderness.  Lower extremities symmetric as compared to each other. No calf tenderness. Negative Homan's. No palpable cords.   Neurological: She is alert.  Skin: Skin is warm and dry.  Psychiatric: She has a normal mood and affect. Her behavior is normal. Thought content normal.  Nursing note and vitals reviewed.   ED Course  Procedures (including critical care time) Labs Review Labs Reviewed - No data to display  Imaging Review Dg Chest 2 View  11/02/2015  CLINICAL DATA:  Sudden onset acute sob, wheezing approx 1 hr ago, states hx of asthma EXAM: CHEST - 2 VIEW COMPARISON:  07/10/2015 FINDINGS: Lungs are clear. Heart size and mediastinal contours are within normal limits. No effusion.  No pneumothorax. Visualized skeletal structures are unremarkable. IMPRESSION: No acute cardiopulmonary disease.  Electronically Signed   By: Corlis Leak  Hassell M.D.   On: 11/02/2015 10:47   I have personally reviewed and evaluated these images and lab results as part of my medical decision-making.   EKG Interpretation None      MDM   Final diagnoses:  Asthma exacerbation    -44yF with dyspnea. -likely asthma exacerbation. -now much improved after 10mg  albuterol/4061m atrovent -mild persistent wheezing but appears comfortable and speaks in complete sentences - dose of solumedrol by ems. Will give another dose of decadron prior to DC. -requesting refills for nebulizer. Reports has sufficient quantities of other meds.      Raeford RazorStephen Indya Oliveria, MD 11/11/15 1151

## 2015-11-02 NOTE — ED Notes (Signed)
Per EMS, Pt, from work, c/o SOB starting this morning.  Denies pain.  Hx of asthma.  Pt reports using inhaler w/o relief.  10mg  Albuterol, 1mg  Atrovent, and 125mg  Solu-medrol given en route.  Pt reports frequent exacerbations during allergy season.

## 2015-11-02 NOTE — ED Notes (Signed)
Bed: WA07 Expected date:  Expected time:  Means of arrival:  Comments: Ems-wheezing

## 2015-11-02 NOTE — ED Notes (Signed)
Pt reports feeling much better after treatments en route.

## 2015-11-03 MED FILL — ALBUTEROL 0.083% INHAL SOLN: (2.5 MG/3ML | 8 days supply | Qty: 90 | Fill #0

## 2015-11-07 ENCOUNTER — Encounter: Payer: Self-pay | Admitting: Family Medicine

## 2015-11-07 ENCOUNTER — Ambulatory Visit (INDEPENDENT_AMBULATORY_CARE_PROVIDER_SITE_OTHER): Payer: 59 | Admitting: Family Medicine

## 2015-11-07 VITALS — BP 130/72 | HR 78 | Temp 98.6°F | Resp 14 | Ht 62.0 in | Wt 215.0 lb

## 2015-11-07 DIAGNOSIS — J4520 Mild intermittent asthma, uncomplicated: Secondary | ICD-10-CM | POA: Diagnosis not present

## 2015-11-07 DIAGNOSIS — I10 Essential (primary) hypertension: Secondary | ICD-10-CM

## 2015-11-07 DIAGNOSIS — Z91048 Other nonmedicinal substance allergy status: Secondary | ICD-10-CM

## 2015-11-07 DIAGNOSIS — R062 Wheezing: Secondary | ICD-10-CM

## 2015-11-07 DIAGNOSIS — Z9109 Other allergy status, other than to drugs and biological substances: Secondary | ICD-10-CM

## 2015-11-07 LAB — POCT URINALYSIS DIP (DEVICE)
BILIRUBIN URINE: NEGATIVE
Glucose, UA: NEGATIVE mg/dL
HGB URINE DIPSTICK: NEGATIVE
KETONES UR: NEGATIVE mg/dL
LEUKOCYTES UA: NEGATIVE
NITRITE: NEGATIVE
PH: 5.5 (ref 5.0–8.0)
Protein, ur: NEGATIVE mg/dL
Urobilinogen, UA: 0.2 mg/dL (ref 0.0–1.0)

## 2015-11-07 LAB — COMPLETE METABOLIC PANEL WITH GFR
ALT: 15 U/L (ref 6–29)
AST: 14 U/L (ref 10–35)
Albumin: 3.8 g/dL (ref 3.6–5.1)
Alkaline Phosphatase: 57 U/L (ref 33–115)
BILIRUBIN TOTAL: 0.6 mg/dL (ref 0.2–1.2)
BUN: 14 mg/dL (ref 7–25)
CHLORIDE: 107 mmol/L (ref 98–110)
CO2: 25 mmol/L (ref 20–31)
Calcium: 8.7 mg/dL (ref 8.6–10.2)
Creat: 0.72 mg/dL (ref 0.50–1.10)
GFR, Est African American: >89 mL/min (ref >=60)
GLUCOSE: 93 mg/dL (ref 65–99)
Potassium: 4 mmol/L (ref 3.5–5.3)
SODIUM: 139 mmol/L (ref 135–146)
TOTAL PROTEIN: 6.4 g/dL (ref 6.1–8.1)

## 2015-11-07 MED ORDER — CETIRIZINE HCL 10 MG PO TABS: 10.0000 mg | ORAL_TABLET | Freq: Every day | ORAL | Status: DC

## 2015-11-07 MED ORDER — BUDESONIDE-FORMOTEROL FUMARATE 80-4.5 MCG/ACT IN AERO
2.0000 | INHALATION_SPRAY | Freq: Two times a day (BID) | RESPIRATORY_TRACT | Status: DC
Start: 2015-11-07 — End: 2015-11-14

## 2015-11-07 MED ORDER — IPRATROPIUM-ALBUTEROL 0.5-2.5 (3) MG/3ML IN SOLN
3.0000 mL | Freq: Four times a day (QID) | RESPIRATORY_TRACT | Status: DC
  Administered 2015-11-07: 3 mL via RESPIRATORY_TRACT

## 2015-11-07 MED ORDER — MONTELUKAST SODIUM 10 MG PO TABS: 10.0000 mg | ORAL_TABLET | Freq: Every day | ORAL | Status: DC

## 2015-11-07 MED ORDER — AMLODIPINE BESYLATE 5 MG PO TABS: 5.0000 mg | ORAL_TABLET | Freq: Every day | ORAL | Status: DC

## 2015-11-07 MED FILL — ?MONTELUKAST SOD 10 MG TAB: 10 | 30 days supply | Qty: 30 | Fill #0

## 2015-11-07 MED FILL — ?AMLODIPINE BESYLATE 5 MG T: 5 | 30 days supply | Qty: 30 | Fill #0

## 2015-11-07 MED FILL — ?CETIRIZINE HCL 10 MG TABLE: 10 | 30 days supply | Qty: 30 | Fill #0

## 2015-11-07 NOTE — Patient Instructions (Signed)
Asthma, Adult Asthma is a condition of the lungs in which the airways tighten and narrow. Asthma can make it hard to breathe. Asthma cannot be cured, but medicine and lifestyle changes can help control it. Asthma may be started (triggered) by:  Animal skin flakes (dander).  Dust.  Cockroaches.  Pollen.  Mold.  Smoke.  Cleaning products.  Hair sprays or aerosol sprays.  Paint fumes or strong smells.  Cold air, weather changes, and winds.  Crying or laughing hard.  Stress.  Certain medicines or drugs.  Foods, such as dried fruit, potato chips, and sparkling grape juice.  Infections or conditions (colds, flu).  Exercise.  Certain medical conditions or diseases.  Exercise or tiring activities. HOME CARE   Take medicine as told by your doctor.  Use a peak flow meter as told by your doctor. A peak flow meter is a tool that measures how well the lungs are working.  Record and keep track of the peak flow meter's readings.  Understand and use the asthma action plan. An asthma action plan is a written plan for taking care of your asthma and treating your attacks.  To help prevent asthma attacks:  Do not smoke. Stay away from secondhand smoke.  Change your heating and air conditioning filter often.  Limit your use of fireplaces and wood stoves.  Get rid of pests (such as roaches and mice) and their droppings.  Throw away plants if you see mold on them.  Clean your floors. Dust regularly. Use cleaning products that do not smell.  Have someone vacuum when you are not home. Use a vacuum cleaner with a HEPA filter if possible.  Replace carpet with wood, tile, or vinyl flooring. Carpet can trap animal skin flakes and dust.  Use allergy-proof pillows, mattress covers, and box spring covers.  Wash bed sheets and blankets every week in hot water and dry them in a dryer.  Use blankets that are made of polyester or cotton.  Clean bathrooms and kitchens with bleach.  If possible, have someone repaint the walls in these rooms with mold-resistant paint. Keep out of the rooms that are being cleaned and painted.  Wash hands often. GET HELP IF:  You have make a whistling sound when breaking (wheeze), have shortness of breath, or have a cough even if taking medicine to prevent attacks.  The colored mucus you cough up (sputum) is thicker than usual.  The colored mucus you cough up changes from clear or white to yellow, green, gray, or bloody.  You have problems from the medicine you are taking such as:  A rash.  Itching.  Swelling.  Trouble breathing.  You need reliever medicines more than 2-3 times a week.  Your peak flow measurement is still at 50-79% of your personal best after following the action plan for 1 hour.  You have a fever. GET HELP RIGHT AWAY IF:   You seem to be worse and are not responding to medicine during an asthma attack.  You are short of breath even at rest.  You get short of breath when doing very little activity.  You have trouble eating, drinking, or talking.  You have chest pain.  You have a fast heartbeat.  Your lips or fingernails start to turn blue.  You are light-headed, dizzy, or faint.  Your peak flow is less than 50% of your personal best.   This information is not intended to replace advice given to you by your health care provider. Make sure   you discuss any questions you have with your health care provider.   Document Released: 01/02/2008 Document Revised: 04/06/2015 Document Reviewed: 02/12/2013 Elsevier Interactive Patient Education 2016 Elsevier Inc. DASH Eating Plan DASH stands for "Dietary Approaches to Stop Hypertension." The DASH eating plan is a healthy eating plan that has been shown to reduce high blood pressure (hypertension). Additional health benefits may include reducing the risk of type 2 diabetes mellitus, heart disease, and stroke. The DASH eating plan may also help with weight  loss. WHAT DO I NEED TO KNOW ABOUT THE DASH EATING PLAN? For the DASH eating plan, you will follow these general guidelines:  Choose foods with a percent daily value for sodium of less than 5% (as listed on the food label).  Use salt-free seasonings or herbs instead of table salt or sea salt.  Check with your health care provider or pharmacist before using salt substitutes.  Eat lower-sodium products, often labeled as "lower sodium" or "no salt added."  Eat fresh foods.  Eat more vegetables, fruits, and low-fat dairy products.  Choose whole grains. Look for the word "whole" as the first word in the ingredient list.  Choose fish and skinless chicken or [place] more often than red meat. Limit fish, poultry, and meat to 6 oz (170 g) each day.  Limit sweets, desserts, sugars, and sugary drinks.  Choose heart-healthy fats.  Limit cheese to 1 oz (28 g) per day.  Eat more home-cooked food and less restaurant, buffet, and fast food.  Limit fried foods.  Cook foods using methods other than frying.  Limit canned vegetables. If you do use them, rinse them well to decrease the sodium.  When eating at a restaurant, ask that your food be prepared with less salt, or no salt if possible. WHAT FOODS CAN I EAT? Seek help from a dietitian for individual calorie needs. Grains Whole grain or whole wheat bread. Brown rice. Whole grain or whole wheat pasta. Quinoa, bulgur, and whole grain cereals. Low-sodium cereals. Corn or whole wheat flour tortillas. Whole grain cornbread. Whole grain crackers. Low-sodium crackers. Vegetables Fresh or frozen vegetables (raw, steamed, roasted, or grilled). Low-sodium or reduced-sodium tomato and vegetable juices. Low-sodium or reduced-sodium tomato sauce and paste. Low-sodium or reduced-sodium canned vegetables.  Fruits All fresh, canned (in natural juice), or frozen fruits. Meat and Other Protein Products Ground beef (85% or leaner), grass-fed beef, or beef  trimmed of fat. Skinless chicken or [place]. Ground chicken or [place]. Pork trimmed of fat. All fish and seafood. Eggs. Dried beans, peas, or lentils. Unsalted nuts and seeds. Unsalted canned beans. Dairy Low-fat dairy products, such as skim or 1% milk, 2% or reduced-fat cheeses, low-fat ricotta or cottage cheese, or plain low-fat yogurt. Low-sodium or reduced-sodium cheeses. Fats and Oils Tub margarines without trans fats. Light or reduced-fat mayonnaise and salad dressings (reduced sodium). Avocado. Safflower, olive, or canola oils. Natural peanut or almond butter. Other Unsalted popcorn and pretzels. The items listed above may not be a complete list of recommended foods or beverages. Contact your dietitian for more options. WHAT FOODS ARE NOT RECOMMENDED? Grains White bread. White pasta. White rice. Refined cornbread. Bagels and croissants. Crackers that contain trans fat. Vegetables Creamed or fried vegetables. Vegetables in a cheese sauce. Regular canned vegetables. Regular canned tomato sauce and paste. Regular tomato and vegetable juices. Fruits Dried fruits. Canned fruit in light or heavy syrup. Fruit juice. Meat and Other Protein Products Fatty cuts of meat. Ribs, chicken wings, bacon, sausage, bologna, salami, chitterlings, fatback,  hot dogs, bratwurst, and packaged luncheon meats. Salted nuts and seeds. Canned beans with salt. Dairy Whole or 2% milk, cream, half-and-half, and cream cheese. Whole-fat or sweetened yogurt. Full-fat cheeses or blue cheese. Nondairy creamers and whipped toppings. Processed cheese, cheese spreads, or cheese curds. Condiments Onion and garlic salt, seasoned salt, table salt, and sea salt. Canned and packaged gravies. Worcestershire sauce. Tartar sauce. Barbecue sauce. Teriyaki sauce. Soy sauce, including reduced sodium. Steak sauce. Fish sauce. Oyster sauce. Cocktail sauce. Horseradish. Ketchup and mustard. Meat flavorings and tenderizers. Bouillon cubes. Hot  sauce. Tabasco sauce. Marinades. Taco seasonings. Relishes. Fats and Oils Butter, stick margarine, lard, shortening, ghee, and bacon fat. Coconut, palm kernel, or palm oils. Regular salad dressings. Other Pickles and olives. Salted popcorn and pretzels. The items listed above may not be a complete list of foods and beverages to avoid. Contact your dietitian for more information. WHERE CAN I FIND MORE INFORMATION? National Heart, Lung, and Blood Institute: CablePromo.itwww.nhlbi.nih.gov/health/health-topics/topics/dash/   This information is not intended to replace advice given to you by your health care provider. Make sure you discuss any questions you have with your health care provider.   Document Released: 07/05/2011 Document Revised: 08/06/2014 Document Reviewed: 05/20/2013 Elsevier Interactive Patient Education [employer]2016 Elsevier Inc.

## 2015-11-07 NOTE — Progress Notes (Signed)
Subjective:    Patient ID: Wendy James, female    DOB: 03/13/1971, 45 y.o.   MRN: [id number]  Hypertension This is a recurrent problem. The current episode started more than 1 month ago. The problem has been rapidly improving since onset. The problem is controlled. Pertinent negatives include no anxiety, blurred vision, chest pain, headaches, malaise/fatigue, neck pain, orthopnea, palpitations, peripheral edema, PND, shortness of breath or sweats. There are no associated agents to hypertension. Risk factors for coronary artery disease include obesity and sedentary lifestyle. Past treatments include calcium channel blockers. The current treatment provides moderate improvement. Compliance problems: Patient has been out of medication for greater than 1 week.   Asthma She complains of hoarse voice, sputum production and wheezing. There is no chest tightness or shortness of breath. Primary symptoms comments: Patient was recently evaluated in the emergency department and has completed a course of steroids.. This is a recurrent problem. The current episode started in the past 7 days. The problem occurs intermittently. The problem has been unchanged. Associated symptoms include nasal congestion, postnasal drip, rhinorrhea and weight loss. Pertinent negatives include no chest pain, ear congestion, ear pain, fever, headaches, malaise/fatigue, PND, sneezing, sore throat, sweats or trouble swallowing. Her symptoms are aggravated by change in weather and pollen. Her symptoms are alleviated by beta-agonist. She reports minimal improvement on treatment. Her past medical history is significant for asthma.   Past Medical History  Diagnosis Date  . Asthma   . Seasonal allergies   . 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 Marital Status: Married    Spouse Name: N/A  . Number of Children: N/A  . Years of Education: N/A   Occupational History  . Not on file.   Social History  Main Topics  . Smoking status: Former Smoker -- 0.75 packs/day    Types: Cigarettes    Quit date: 11/17/1988  . Smokeless tobacco: Never Used  . Alcohol Use: No  . Drug Use: No  . Sexual Activity: Yes    Birth Control/ Protection: Surgical   Other Topics Concern  . Not on file   Social History Narrative   Immunization History  Administered Date(s) Administered  . Influenza Split 04/11/2012  . Influenza,inj,Quad PF,36+ Mos 04/30/2015  . Pneumococcal Polysaccharide-23 04/30/2015   Review of Systems  Constitutional: Positive for weight loss. Negative for fever, malaise/fatigue and unexpected weight change.  HENT: Positive for hoarse voice, postnasal drip and rhinorrhea. Negative for ear pain, sneezing, sore throat and trouble swallowing.   Eyes: Negative.  Negative for blurred vision.  Respiratory: Positive for sputum production and wheezing. Negative for shortness of breath.   Cardiovascular: Negative.  Negative for chest pain, palpitations, orthopnea and PND.  Gastrointestinal: Negative.   Endocrine: Negative.   Genitourinary: Negative.   Musculoskeletal: Negative.  Negative for neck pain.  Skin: Negative.   Allergic/Immunologic: Negative.   Neurological: Negative.  Negative for headaches.  Hematological: Negative.   Psychiatric/Behavioral: Negative.  Negative for suicidal ideas and sleep disturbance.       Objective:   Physical Exam  Constitutional: She is oriented to person, place, and time. She appears well-developed and well-nourished.  HENT:  Head: Normocephalic and atraumatic.  Right Ear: External ear normal.  Left Ear: External ear normal.  Mouth/Throat: Oropharynx is clear and moist.  Eyes: Conjunctivae and EOM are normal. Pupils are equal, round, and reactive to light.  Neck: Normal range of motion. Neck  supple.  Cardiovascular: Normal rate and intact distal pulses.  An irregular rhythm present.  Pulmonary/Chest: Effort normal. She has wheezes.  Abdominal:  Soft. Bowel sounds are normal.  Musculoskeletal: Normal range of motion.  Neurological: She is alert and oriented to person, place, and time. She has normal reflexes.  Skin: Skin is warm and dry.  Psychiatric: She has a normal mood and affect. Her behavior is normal. Judgment and thought content normal.      BP 130/72 mmHg  Pulse 78  Temp(Src) 98.6 F (37 C) (Oral)  Resp 14  Ht  (1.575 m)  Wt 215 lb (97.523 kg)  BMI 39.31 kg/m2  SpO2 100% Assessment & Plan:   1. Mild intermittent asthma, uncomplicated Will start a trial of symbicort 80-4.5 mcg/act asthma. Wheezing improved following nebulizer treatment.  - montelukast (SINGULAIR) 10 MG tablet; Take 1 tablet (10 mg total) by mouth at bedtime.  Dispense: 30 tablet; Refill: 11 - budesonide-formoterol (SYMBICORT) 80-4.5 MCG/ACT inhaler; Inhale 2 puffs into the lungs 2 (two) times daily.  Dispense: 1 Inhaler; Refill: 5  2. Essential hypertension Blood pressure is at goal on current medication regimen. Will continue amlodipine 5 mg daily.  - amLODipine (NORVASC) 5 MG tablet; Take 1 tablet (5 mg total) by mouth daily.  Dispense: 30 tablet; Refill: 5 - COMPLETE METABOLIC PANEL WITH GFR - POCT urinalysis dipstick  3. Environmental allergies - cetirizine (ZYRTEC) 10 MG tablet; Take 1 tablet (10 mg total) by mouth daily.  Dispense: 30 tablet; Refill: 11  4. Wheezing Wheezing improved post nebulizer treatment.  - ipratropium-albuterol (DUONEB) 0.5-2.5 (3) MG/3ML nebulizer solution 3 mL; Take 3 mLs by nebulization every 6 (six) hours.   RTC: 1 month for asthma and 3 months for hypertension    The patient was given clear instructions to go to ER or return to medical center if symptoms do not improve, worsen or new problems develop. The patient verbalized understanding. Will notify patient with laboratory results.   Massie Maroon, FNP

## 2015-11-08 MED FILL — SYMBICORT 80-4.5 MCG INH: 80-4.5 | 30 days supply | Qty: 10 | Fill #0

## 2015-11-14 ENCOUNTER — Other Ambulatory Visit: Payer: Self-pay | Admitting: Internal Medicine

## 2015-11-14 DIAGNOSIS — J4520 Mild intermittent asthma, uncomplicated: Secondary | ICD-10-CM

## 2015-11-14 MED ORDER — BUDESONIDE-FORMOTEROL FUMARATE 80-4.5 MCG/ACT IN AERO: 2.0000 | INHALATION_SPRAY | Freq: Two times a day (BID) | RESPIRATORY_TRACT | Status: DC

## 2015-11-22 ENCOUNTER — Encounter (HOSPITAL_COMMUNITY): Payer: Self-pay

## 2015-11-22 ENCOUNTER — Emergency Department (HOSPITAL_COMMUNITY): Payer: 59

## 2015-11-22 ENCOUNTER — Emergency Department (HOSPITAL_COMMUNITY)
Admission: EM | Admit: 2015-11-22 | Discharge: 2015-11-22 | Disposition: A | Discharge status: Home / Self Care | Payer: 59 | Attending: Emergency Medicine | Admitting: Emergency Medicine

## 2015-11-22 DIAGNOSIS — Z7951 Long term (current) use of inhaled steroids: Secondary | ICD-10-CM | POA: Insufficient documentation

## 2015-11-22 DIAGNOSIS — Z8739 Personal history of other diseases of the musculoskeletal system and connective tissue: Secondary | ICD-10-CM | POA: Insufficient documentation

## 2015-11-22 DIAGNOSIS — Z79899 Other long term (current) drug therapy: Secondary | ICD-10-CM | POA: Insufficient documentation

## 2015-11-22 DIAGNOSIS — J45901 Unspecified asthma with (acute) exacerbation: Secondary | ICD-10-CM

## 2015-11-22 DIAGNOSIS — Z87891 Personal history of nicotine dependence: Secondary | ICD-10-CM | POA: Insufficient documentation

## 2015-11-22 MED ORDER — PREDNISONE 50 MG PO TABS: 50.0000 mg | ORAL_TABLET | Freq: Every day | ORAL | Status: DC

## 2015-11-22 MED ORDER — ALBUTEROL SULFATE (2.5 MG/3ML) 0.083% IN NEBU
5.0000 mg | INHALATION_SOLUTION | Freq: Once | RESPIRATORY_TRACT | Status: AC
  Administered 2015-11-22: 5 mg via RESPIRATORY_TRACT
  Filled 2015-11-22: qty 6

## 2015-11-22 MED ORDER — PREDNISONE 20 MG PO TABS
60.0000 mg | ORAL_TABLET | Freq: Once | ORAL | Status: AC
  Administered 2015-11-22: 60 mg via ORAL
  Filled 2015-11-22: qty 3

## 2015-11-22 MED ORDER — IPRATROPIUM BROMIDE 0.02 % IN SOLN
0.5000 mg | Freq: Once | RESPIRATORY_TRACT | Status: AC
  Administered 2015-11-22: 0.5 mg via RESPIRATORY_TRACT
  Filled 2015-11-22: qty 2.5

## 2015-11-22 NOTE — Discharge Instructions (Signed)
Return to the ED with any concerns including difficulty breathing despite using albuterol every 4 hours, not drinking fluids, decreased urine output, vomiting and not able to keep down liquids or medications, decreased level of alertness/lethargy, or any other alarming symptoms °

## 2015-11-22 NOTE — ED Notes (Signed)
Pt complains of being short of breath for two days, inhaler and breathing treatments are not working

## 2015-11-22 NOTE — ED Provider Notes (Signed)
CSN: [id number]     Arrival date & time 11/22/15  0509 History   First MD Initiated Contact with Patient 11/22/15 [phone]     Chief Complaint  Patient presents with  . Asthma     (Consider location/radiation/quality/duration/timing/severity/associated sxs/prior Treatment) HPI  Pt with hx of asthma presents with c/o 2 days of constant wheezing.  She states she has been using her inhalers at home without much relief.  No fever.  Has had dry barking cough.  No vomiting or diarrhea.  Has continued to drink liquids well.  Symptoms are constant.  There are no other associated systemic symptoms, there are no other alleviating or modifying factors.  No chest pain.   Past Medical History  Diagnosis Date  . Asthma   . Seasonal allergies   . 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 Cholecystectomy    . Cesarean section      x 4  . Tubal ligation    . Hysteroscopy w/d&c N/A 02/18/2015    Procedure: DILATATION AND CURETTAGE /HYSTEROSCOPY;  Surgeon: Allie BossierMyra C Dove, MD;  Location: [facility];  Service: Gynecology;  Laterality: N/A;   Family History  Problem Relation Age of Onset  . Hypertension Mother   . Stroke Sister   . Seizures Sister    Social History  Substance Use Topics  . Smoking status: Former Smoker -- 0.75 packs/day    Types: Cigarettes    Quit date: 11/17/1988  . Smokeless tobacco: Never Used  . Alcohol Use: No   OB History    Gravida Para Term Preterm AB TAB SAB Ectopic Multiple Living   5 4 4  0 1 0 1 0 0 3     Review of Systems  ROS reviewed and all otherwise negative except for mentioned in HPI    Allergies  Shrimp  Home Medications   Prior to Admission medications   Medication Sig Start Date End Date Taking? Authorizing Provider  albuterol (PROVENTIL HFA;VENTOLIN HFA) 108 (90 BASE) MCG/ACT inhaler Inhale 2 puffs into the lungs every 6 (six) hours as needed for wheezing or shortness of breath. 06/07/15  Yes Massie MaroonLachina M Hollis,  FNP  albuterol (PROVENTIL) (2.5 MG/3ML) 0.083% nebulizer solution Take 3 mLs (2.5 mg total) by nebulization every 6 (six) hours as needed for wheezing or shortness of breath. Reported on 08/17/2015 11/02/15  Yes Raeford RazorStephen Kohut, MD  amLODipine (NORVASC) 5 MG tablet Take 1 tablet (5 mg total) by mouth daily. 11/07/15  Yes Massie MaroonLachina M Hollis, FNP  budesonide-formoterol (SYMBICORT) 80-4.5 MCG/ACT inhaler Inhale 2 puffs into the lungs 2 (two) times daily. 11/14/15  Yes Quentin Angstlugbemiga E Jegede, MD  cetirizine (ZYRTEC) 10 MG tablet Take 1 tablet (10 mg total) by mouth daily. 11/07/15  Yes Massie MaroonLachina M Hollis, FNP  montelukast (SINGULAIR) 10 MG tablet Take 1 tablet (10 mg total) by mouth at bedtime. 11/07/15  Yes Massie MaroonLachina M Hollis, FNP  azithromycin (ZITHROMAX) 250 MG tablet Take 1 tablet (250 mg total) by mouth daily. Take first 2 tablets together, then 1 every day until finished. Patient not taking: Reported on 08/17/2015 07/10/15   Marlon Peliffany Greene, PA-C  cyclobenzaprine (FLEXERIL) 10 MG tablet Take 1 tablet (10 mg total) by mouth 2 (two) times daily as needed for muscle spasms. Patient not taking: Reported on 11/07/2015 08/17/15   Barrett HenleNicole Elizabeth Nadeau, PA-C  naproxen (NAPROSYN) 375 MG tablet Take 1 tablet (375 mg total) by mouth 2 (two) times  daily. Patient not taking: Reported on 11/07/2015 08/17/15   Barrett Henle, PA-C  predniSONE (DELTASONE) 50 MG tablet Take 1 tablet (50 mg total) by mouth daily. 11/22/15   Jerelyn Scott, MD   BP 128/77 mmHg  Pulse 81  Temp(Src) 97.9 F (36.6 C) (Oral)  Resp 15  SpO2 96%  Vitals reviewed Physical Exam  Physical Examination: General appearance - alert, well appearing, and in no distress Mental status - alert, oriented to person, place, and time Eyes - no conjunctival injection no scleral icterus Mouth - mucous membranes moist, pharynx normal without lesions Chest - BSS, bilateral expiratory wheezing, some upper airway transmitted sounds as well, no retractions, speaking  in full sentences. Heart - normal rate, regular rhythm, normal S1, S2, no murmurs, rubs, clicks or gallops Abdomen - soft, nontender, nondistended, no masses or organomegaly Neurological - alert, oriented, normal speech Extremities - peripheral pulses normal, no pedal edema, no clubbing or cyanosis Skin - normal coloration and turgor, no rashes  ED Course  Procedures (including critical care time) Labs Review Labs Reviewed - No data to display  Imaging Review Dg Chest 2 View  11/22/2015  CLINICAL DATA:  Worsening cough EXAM: CHEST  2 VIEW COMPARISON:  11/02/2015 FINDINGS: Normal heart size and mediastinal contours. No acute infiltrate or edema. No effusion or pneumothorax. Cholecystectomy clips IMPRESSION: No active cardiopulmonary disease. Electronically Signed   By: Marnee Spring M.D.   On: 11/22/2015 05:45   I have personally reviewed and evaluated these images and lab results as part of my medical decision-making.   EKG Interpretation None      MDM   Final diagnoses:  Asthma exacerbation    Pt presenting with wheezing, pt with hx of asthma exacerbation.  She was treated with steroids and duonebs with improvement in her symptoms.  Will go home with short course of prednisone.  No pneumonia on CXR, no PTX.  Doubt ACS or PE, CHF- symptoms are very c/w viral infection causing asthma exacerbation.  Discharged with strict return precautions.  Pt agreeable with plan.    Jerelyn Scott, MD 11/23/15 1220

## 2015-11-28 MED FILL — !VENTOLIN HFA INHALER: 108 (90 BAS | 25 days supply | Qty: 18 | Fill #1

## 2015-12-23 MED FILL — VENTOLIN HFA 90 MCG INHALER: 108 (90 BAS | 25 days supply | Qty: 18 | Fill #2

## 2015-12-27 MED FILL — SYMBICORT 80-4.5 MCG INH: 80-4.5 | 30 days supply | Qty: 10 | Fill #1

## 2015-12-27 MED FILL — ALBUTEROL 0.083% INHAL SOLN: (2.5 MG/3ML | 8 days supply | Qty: 90 | Fill #1

## 2016-02-06 ENCOUNTER — Ambulatory Visit: Payer: 59 | Admitting: Family Medicine

## 2016-02-16 IMAGING — CR DG CHEST 2V
2 series · 2 of 2 positions shown · non-contrast
Comparison: January 23, 2015

CLINICAL DATA: Mid chest pain and shortness of breath for 2 days

EXAM:
CHEST  2 VIEW

[w chest pa]
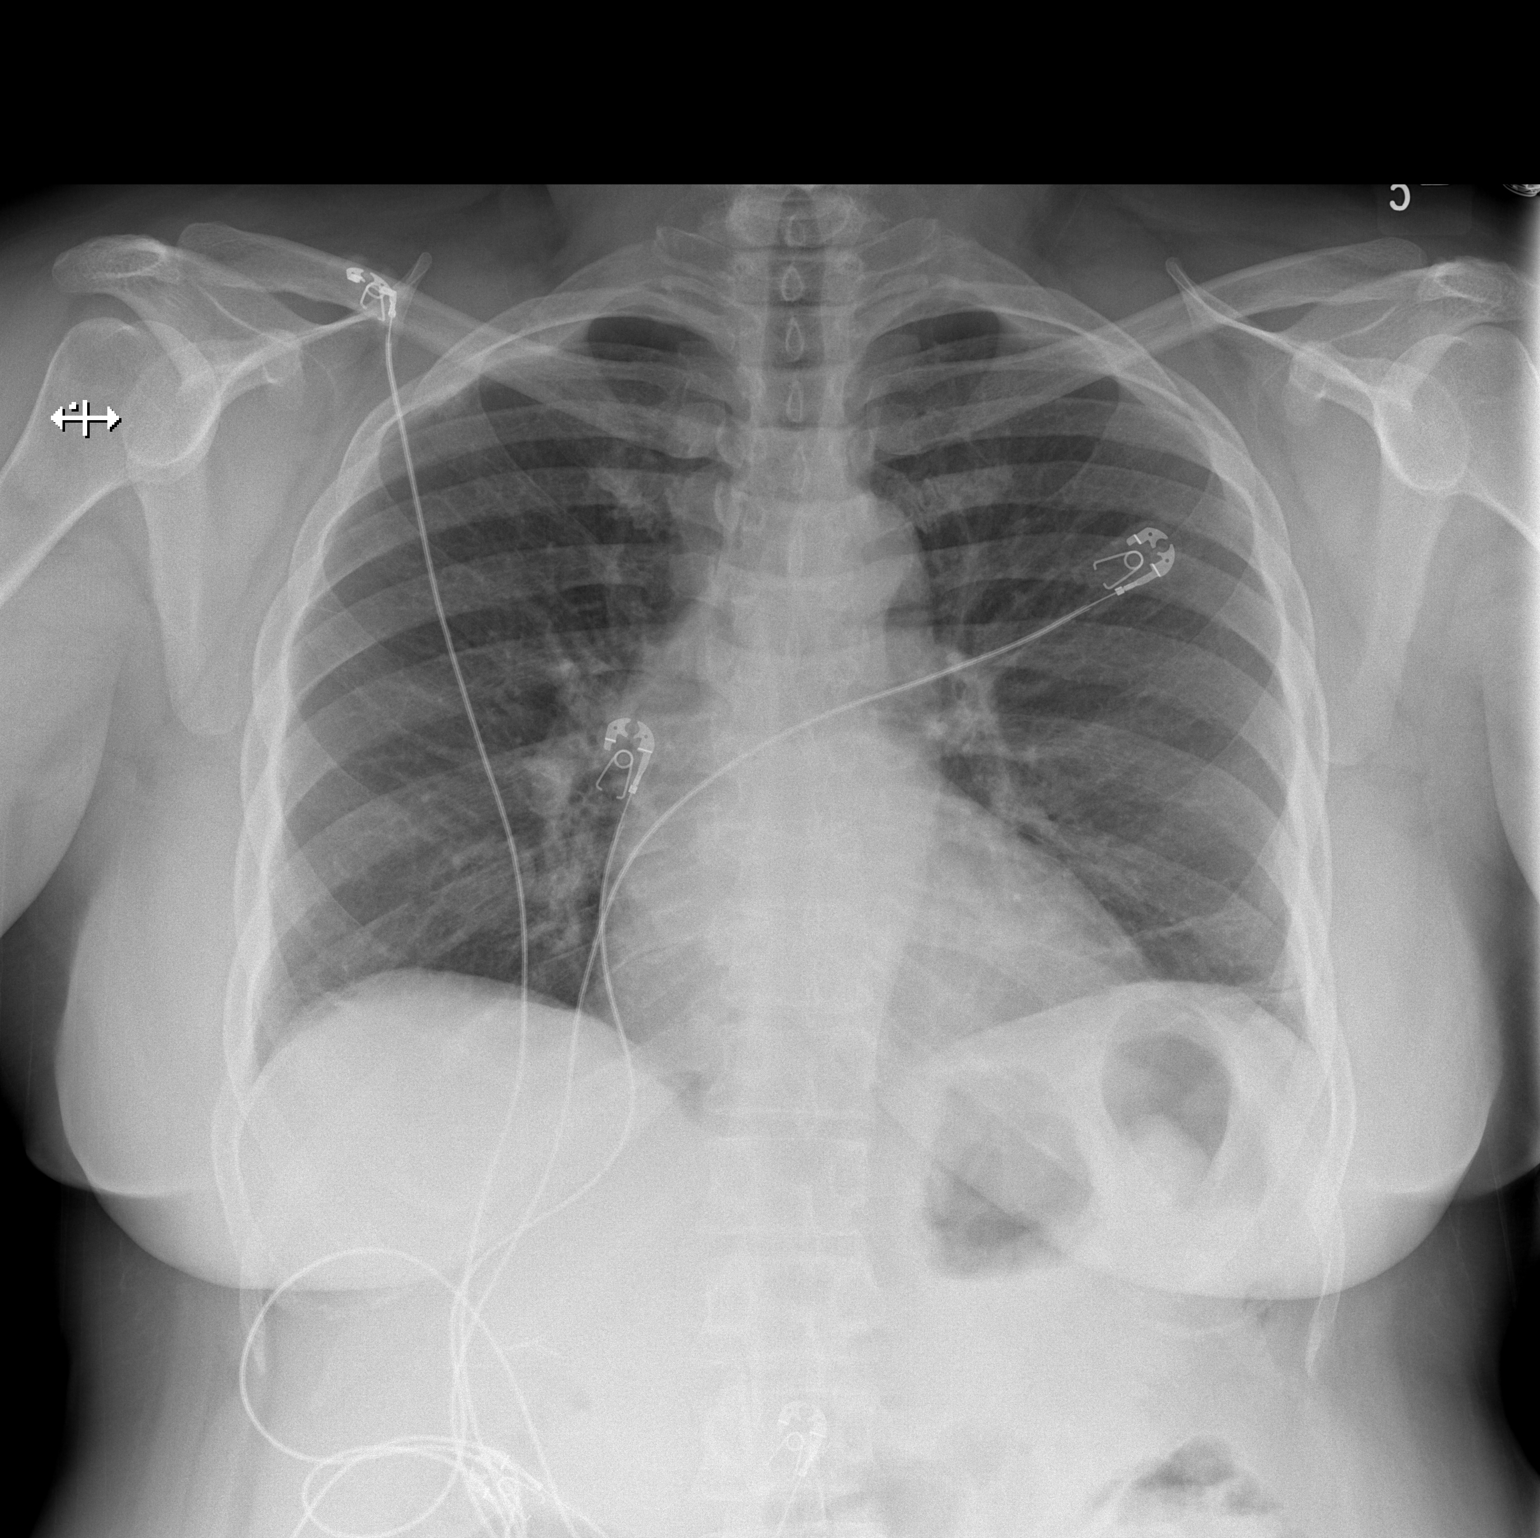

[w chest lat]
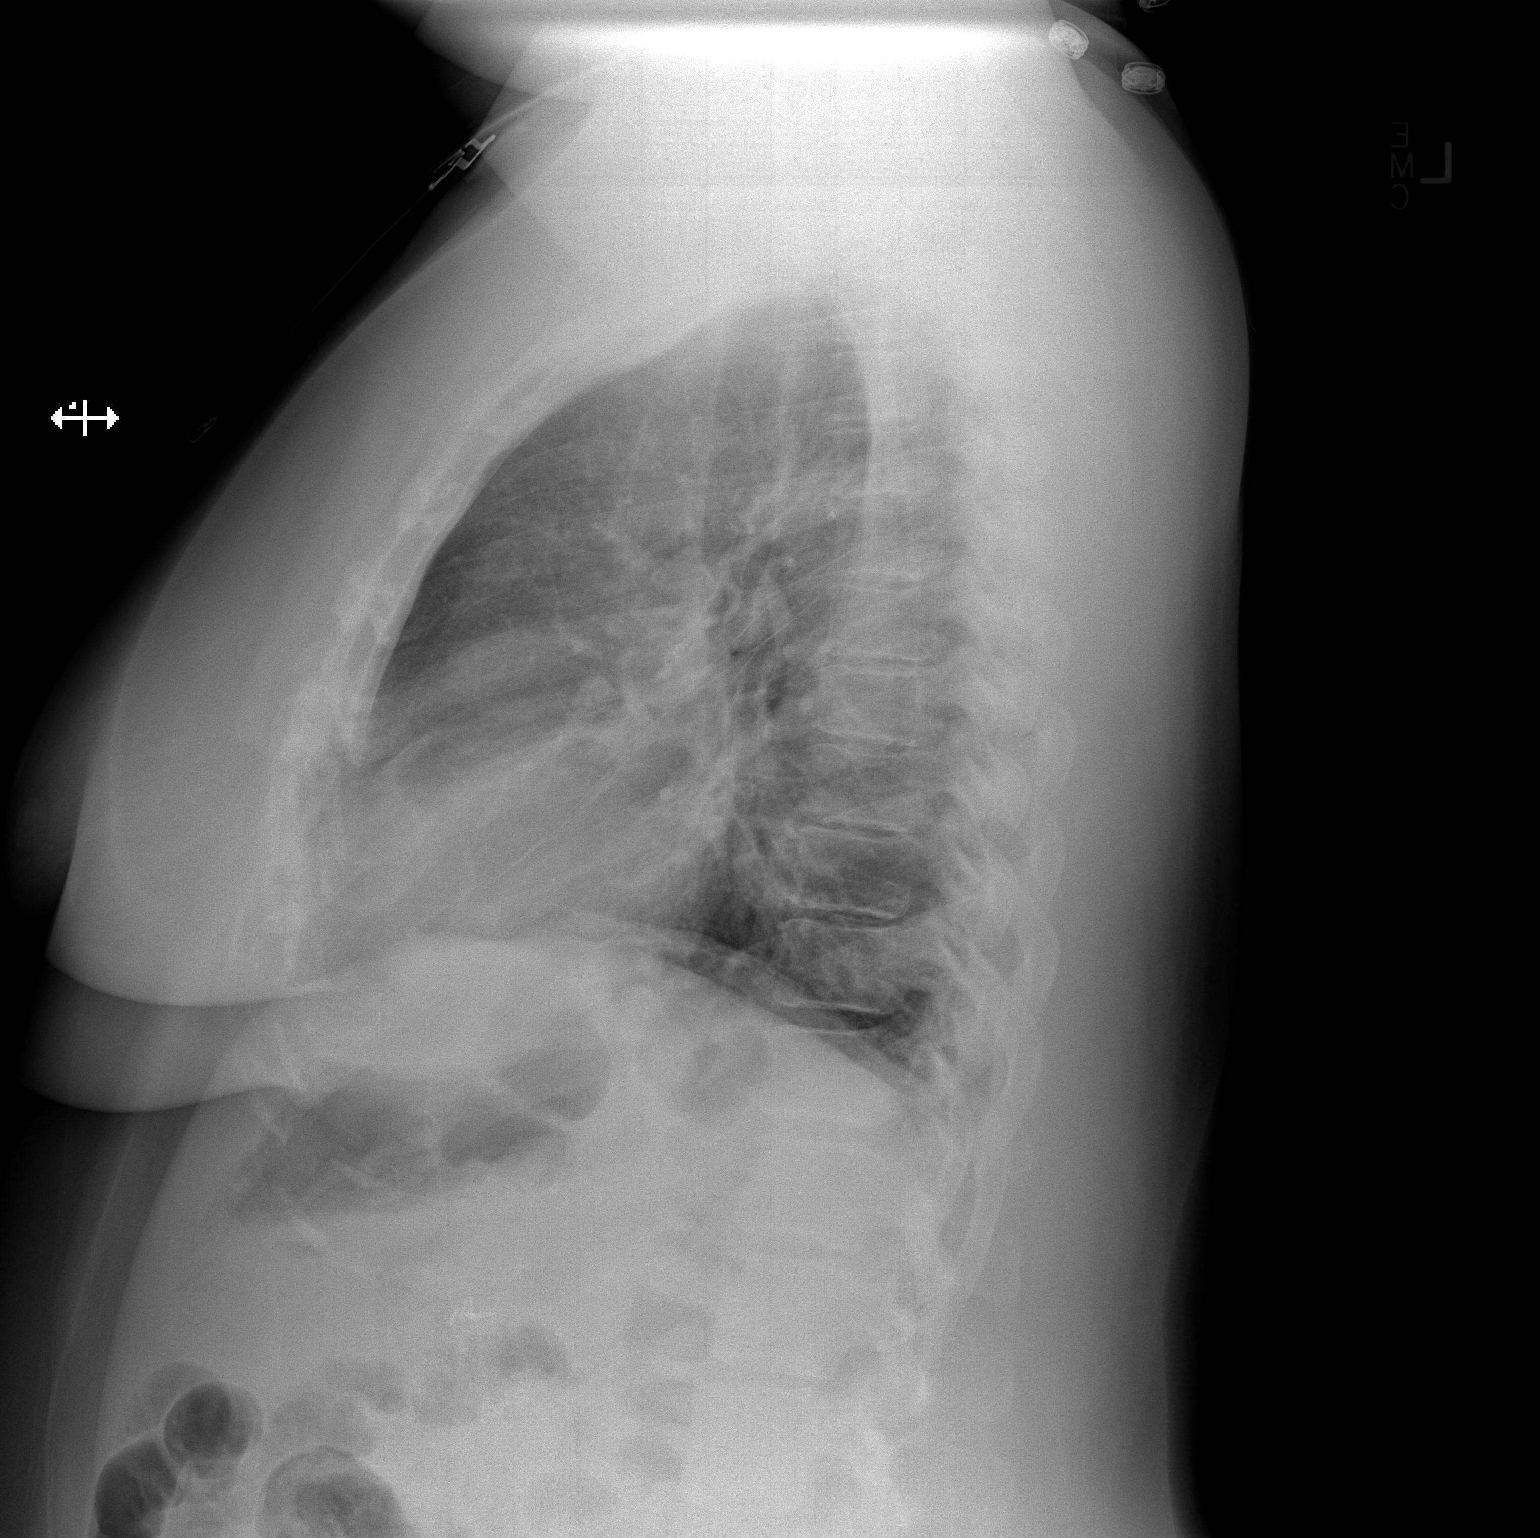

[2 of 2 positions shown; findings below may reference images not displayed]

FINDINGS: The heart size and mediastinal contours are within normal limits.
There is minimal atelectasis of left lung base. There is no focal
pneumonia, pulmonary edema, or pleural effusion. The visualized
skeletal structures are stable.
IMPRESSION: Minimal atelectasis of left lung base.  No focal pneumonia.

## 2016-02-18 ENCOUNTER — Emergency Department (HOSPITAL_COMMUNITY): Payer: Self-pay

## 2016-02-18 ENCOUNTER — Encounter (HOSPITAL_COMMUNITY): Payer: Self-pay | Admitting: *Deleted

## 2016-02-18 ENCOUNTER — Emergency Department (HOSPITAL_COMMUNITY)
Admission: EM | Admit: 2016-02-18 | Discharge: 2016-02-18 | Disposition: A | Discharge status: Home / Self Care | Payer: Self-pay | Attending: Physician Assistant | Admitting: Physician Assistant

## 2016-02-18 DIAGNOSIS — Z87891 Personal history of nicotine dependence: Secondary | ICD-10-CM | POA: Insufficient documentation

## 2016-02-18 DIAGNOSIS — Z79899 Other long term (current) drug therapy: Secondary | ICD-10-CM | POA: Insufficient documentation

## 2016-02-18 DIAGNOSIS — M25571 Pain in right ankle and joints of right foot: Secondary | ICD-10-CM

## 2016-02-18 DIAGNOSIS — M779 Enthesopathy, unspecified: Secondary | ICD-10-CM

## 2016-02-18 DIAGNOSIS — J45909 Unspecified asthma, uncomplicated: Secondary | ICD-10-CM | POA: Insufficient documentation

## 2016-02-18 MED ORDER — TRAMADOL HCL 50 MG PO TABS: 50.0000 mg | ORAL_TABLET | Freq: Four times a day (QID) | ORAL | Status: DC | PRN

## 2016-02-18 NOTE — ED Notes (Signed)
Pt being transported to x-ray

## 2016-02-18 NOTE — ED Notes (Signed)
Pt returned from xray

## 2016-02-18 NOTE — ED Notes (Signed)
Ice pack applied to pt's right foot and foot elevated for comfort.

## 2016-02-18 NOTE — ED Notes (Signed)
Pt c/o right foot pain - states when began, felt pain shoot up leg to knee. States when attempts to apply pressure today, foot is very painful. Denies injury.

## 2016-02-18 NOTE — Discharge Instructions (Signed)
Your xray is normal today. Take naprosyn for pain and inflammation. Tramadol for severe pain. Get an orthotic insert to support the arch of your foot. Follow up with family doctor if not improving. Return if worsening.    Ankle Pain Ankle pain is a common symptom. The bones, cartilage, tendons, and muscles of the ankle joint perform a lot of work each day. The ankle joint holds your body weight and allows you to move around. Ankle pain can occur on either side or back of 1 or both ankles. Ankle pain may be sharp and burning or dull and aching. There may be tenderness, stiffness, redness, or warmth around the ankle. The pain occurs more often when a person walks or puts pressure on the ankle. CAUSES  There are many reasons ankle pain can develop. It is important to work with your caregiver to identify the cause since many conditions can impact the bones, cartilage, muscles, and tendons. Causes for ankle pain include:  Injury, including a break (fracture), sprain, or strain often due to a fall, sports, or a high-impact activity.  Swelling (inflammation) of a tendon (tendonitis).  Achilles tendon rupture.  Ankle instability after repeated sprains and strains.  Poor foot alignment.  Pressure on a nerve (tarsal tunnel syndrome).  Arthritis in the ankle or the lining of the ankle.  Crystal formation in the ankle (gout or pseudogout). DIAGNOSIS  A diagnosis is based on your medical history, your symptoms, results of your physical exam, and results of diagnostic tests. Diagnostic tests may include X-ray exams or a computerized magnetic scan (magnetic resonance imaging, MRI). TREATMENT  Treatment will depend on the cause of your ankle pain and may include:  Keeping pressure off the ankle and limiting activities.  Using crutches or other walking support (a cane or brace).  Using rest, ice, compression, and elevation.  Participating in physical therapy or home exercises.  Wearing shoe  inserts or special shoes.  Losing weight.  Taking medications to reduce pain or swelling or receiving an injection.  Undergoing surgery. HOME CARE INSTRUCTIONS   Only take over-the-counter or prescription medicines for pain, discomfort, or fever as directed by your caregiver.  Put ice on the injured area.  Put ice in a plastic bag.  Place a towel between your skin and the bag.  Leave the ice on for 15-20 minutes at a time, 03-04 times a day.  Keep your leg raised (elevated) when possible to lessen swelling.  Avoid activities that cause ankle pain.  Follow specific exercises as directed by your caregiver.  Record how often you have ankle pain, the location of the pain, and what it feels like. This information may be helpful to you and your caregiver.  Ask your caregiver about returning to work or sports and whether you should drive.  Follow up with your caregiver for further examination, therapy, or testing as directed. SEEK MEDICAL CARE IF:   Pain or swelling continues or worsens beyond 1 week.  You have an oral temperature above 102 F (38.9 C).  You are feeling unwell or have chills.  You are having an increasingly difficult time with walking.  You have loss of sensation or other new symptoms.  You have questions or concerns. MAKE SURE YOU:   Understand these instructions.  Will watch your condition.  Will get help right away if you are not doing well or get worse.   This information is not intended to replace advice given to you by your health care provider.  Make sure you discuss any questions you have with your health care provider.   Document Released: 01/03/2010 Document Revised: 10/08/2011 Document Reviewed: 02/15/2015 Elsevier Interactive Patient Education [employer].

## 2016-02-18 NOTE — ED Provider Notes (Signed)
History  By signing my name below, I, Wendy James, attest that this documentation has been prepared under the direction and in the presence of Wendy Ricciardelli, PA-C. Electronically Signed: Earmon James, ED Scribe. 02/18/2016. 10:52 AM.  Chief Complaint  Patient presents with  . Foot Pain   The history is provided by the patient and medical records. No language interpreter was used.    HPI Comments:  Wendy James is a 45 y.o. obese female who presents to the Emergency Department complaining of severe lateral right foot pain that began yesterday while at work. Pt reports associated swelling. She states she stumped her right great toe two days ago and reports one episode of shooting pain yesterday. She states that she awoke about 7 hours ago and was unable to bear weight on the right foot. She reports taking Tylenol with no significant relief of the pain. Bearing weight and touching the area increases the pain. She denies alleviating factors. She denies current right great toe pain. She denies fever, chills, numbness, tingling or weakness of the RLE, right foot or ankle, bruising or wounds. She reports standing on her feet for long periods of time for work. She denies h/o gout.  Past Medical History  Diagnosis Date  . Asthma   . Seasonal allergies   . 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 Cholecystectomy    . Cesarean section      x 4  . Tubal ligation    . Hysteroscopy w/d&c N/A 02/18/2015    Procedure: DILATATION AND CURETTAGE /HYSTEROSCOPY;  Surgeon: Wendy Bossier, MD;  Location: [facility];  Service: Gynecology;  Laterality: N/A;   Family History  Problem Relation Age of Onset  . Hypertension Mother   . Stroke Sister   . Seizures Sister    Social History  Substance Use Topics  . Smoking status: Former Smoker -- 0.75 packs/day    Types: Cigarettes    Quit date: 11/17/1988  . Smokeless tobacco: Never Used  . Alcohol  Use: No   OB History    Gravida Para Term Preterm AB TAB SAB Ectopic Multiple Living   5 4 4  0 1 0 1 0 0 3     Review of Systems  Musculoskeletal: Positive for joint swelling and arthralgias.  Skin: Negative for color change and wound.  Neurological: Negative for weakness and numbness.    Allergies  Shrimp  Home Medications   Prior to Admission medications   Medication Sig Start Date End Date Taking? Authorizing Provider  albuterol (PROVENTIL HFA;VENTOLIN HFA) 108 (90 BASE) MCG/ACT inhaler Inhale 2 puffs into the lungs every 6 (six) hours as needed for wheezing or shortness of breath. 06/07/15   Massie Maroon, FNP  albuterol (PROVENTIL) (2.5 MG/3ML) 0.083% nebulizer solution Take 3 mLs (2.5 mg total) by nebulization every 6 (six) hours as needed for wheezing or shortness of breath. Reported on 08/17/2015 11/02/15   Wendy Razor, MD  amLODipine (NORVASC) 5 MG tablet Take 1 tablet (5 mg total) by mouth daily. 11/07/15   Massie Maroon, FNP  azithromycin (ZITHROMAX) 250 MG tablet Take 1 tablet (250 mg total) by mouth daily. Take first 2 tablets together, then 1 every day until finished. Patient not taking: Reported on 08/17/2015 07/10/15   Wendy Pel, PA-C  budesonide-formoterol [facility]) 80-4.5 MCG/ACT inhaler Inhale 2 puffs into the lungs 2 (two) times daily. 11/14/15   Wendy Angst, MD  cetirizine (ZYRTEC) 10 MG tablet Take 1 tablet (10 mg total) by mouth daily. 11/07/15   Massie Maroon, FNP  cyclobenzaprine (FLEXERIL) 10 MG tablet Take 1 tablet (10 mg total) by mouth 2 (two) times daily as needed for muscle spasms. Patient not taking: Reported on 11/07/2015 08/17/15   Wendy Henle, PA-C  montelukast (SINGULAIR) 10 MG tablet Take 1 tablet (10 mg total) by mouth at bedtime. 11/07/15   Massie Maroon, FNP  naproxen (NAPROSYN) 375 MG tablet Take 1 tablet (375 mg total) by mouth 2 (two) times daily. Patient not taking: Reported on 11/07/2015 08/17/15   Wendy Henle, PA-C  predniSONE (DELTASONE) 50 MG tablet Take 1 tablet (50 mg total) by mouth daily. 11/22/15   Wendy Scott, MD   Triage Vitals: BP 155/90 mmHg  Pulse 70  Temp(Src) 98.5 F (36.9 C) (Oral)  Resp 19  SpO2 100% Physical Exam  Constitutional: She is oriented to person, place, and time. She appears well-developed and well-nourished.  HENT:  Head: Normocephalic and atraumatic.  Eyes: EOM are normal.  Neck: Normal range of motion.  Cardiovascular: Normal rate.   Pulmonary/Chest: Effort normal.  Musculoskeletal: Normal range of motion.  Mild swelling noted over right lateral malleolus. ttp diffusely over lateral ankle, with most tenderness over posterior to malleolus. Normal foot otherwise. dp pulse intact. Pain with rom of the ankle joint.   Neurological: She is alert and oriented to person, place, and time.  Skin: Skin is warm and dry.  Psychiatric: She has a normal mood and affect. Her behavior is normal.  Nursing note and vitals reviewed.   ED Course  Procedures (including critical care time) DIAGNOSTIC STUDIES: Oxygen Saturation is 100% on RA, normal by my interpretation.   COORDINATION OF CARE: 9:56 AM- Will X-Ray right ankle. Pt verbalizes understanding and agrees to plan.  Medications - No data to display  Labs Review Labs Reviewed - No data to display  Imaging Review Dg Ankle Complete Right  02/18/2016  CLINICAL DATA:  Lateral right ankle pain. EXAM: RIGHT ANKLE - COMPLETE 3+ VIEW COMPARISON:  None. FINDINGS: There is no evidence of fracture, dislocation, or joint effusion. There is no evidence of arthropathy or other focal bone abnormality. Soft tissues are unremarkable. IMPRESSION: Negative. Electronically Signed   By: Irish Lack M.D.   On: 02/18/2016 10:41   I have personally reviewed and evaluated these images and lab results as part of my medical decision-making.   EKG Interpretation None      MDM   Final diagnoses:  Right ankle pain   Tendonitis   Patient in emergency department with nontraumatic pain to the right lateral ankle. She does have mild swelling, no warmth to the touch. Range of motion is painful, however patient is able to move her ankle actively and passively. Neurovascularly intact. Patient states she walks and stands on her feet all day every day. Most likely tendinitis. Consider gout, however unlikely given patient has no history of gout. Doubt septic joint. Will have her try naproxen, tramadol, Ace wrap and crutches, follow-up with family doctor. Return precautions discussed  Filed Vitals:   02/18/16 0942  BP: 155/90  Pulse: 70  Temp: 98.5 F (36.9 C)  TempSrc: Oral  Resp: 19  SpO2: 100%   I personally performed the services described in this documentation, which was scribed in my presence. The recorded information has been reviewed and is accurate.   Jaynie Crumble, PA-C 02/18/16 [address], PA-C 02/18/16  1104   Courteney Randall An, MD 02/19/16 [phone]

## 2016-02-22 ENCOUNTER — Other Ambulatory Visit: Payer: Self-pay | Admitting: Family Medicine

## 2016-02-22 DIAGNOSIS — J4520 Mild intermittent asthma, uncomplicated: Secondary | ICD-10-CM

## 2016-03-02 MED FILL — VENTOLIN HFA 90 MCG INHALER: 108 (90 BAS | 30 days supply | Qty: 18 | Fill #0

## 2016-04-05 ENCOUNTER — Emergency Department (HOSPITAL_COMMUNITY)
Admission: EM | Admit: 2016-04-05 | Discharge: 2016-04-05 | Disposition: A | Discharge status: Home / Self Care | Payer: 59 | Attending: Emergency Medicine | Admitting: Emergency Medicine

## 2016-04-05 ENCOUNTER — Encounter (HOSPITAL_COMMUNITY): Payer: Self-pay | Admitting: Emergency Medicine

## 2016-04-05 ENCOUNTER — Emergency Department (HOSPITAL_COMMUNITY): Payer: 59

## 2016-04-05 DIAGNOSIS — M7731 Calcaneal spur, right foot: Secondary | ICD-10-CM

## 2016-04-05 DIAGNOSIS — Z7951 Long term (current) use of inhaled steroids: Secondary | ICD-10-CM | POA: Insufficient documentation

## 2016-04-05 DIAGNOSIS — I10 Essential (primary) hypertension: Secondary | ICD-10-CM | POA: Insufficient documentation

## 2016-04-05 DIAGNOSIS — J45909 Unspecified asthma, uncomplicated: Secondary | ICD-10-CM | POA: Insufficient documentation

## 2016-04-05 DIAGNOSIS — M79671 Pain in right foot: Secondary | ICD-10-CM

## 2016-04-05 DIAGNOSIS — M722 Plantar fascial fibromatosis: Secondary | ICD-10-CM

## 2016-04-05 DIAGNOSIS — J4521 Mild intermittent asthma with (acute) exacerbation: Secondary | ICD-10-CM | POA: Insufficient documentation

## 2016-04-05 DIAGNOSIS — Z79899 Other long term (current) drug therapy: Secondary | ICD-10-CM | POA: Insufficient documentation

## 2016-04-05 DIAGNOSIS — Z87891 Personal history of nicotine dependence: Secondary | ICD-10-CM | POA: Insufficient documentation

## 2016-04-05 MED ORDER — NAPROXEN 500 MG PO TABS: 500.0000 mg | ORAL_TABLET | Freq: Two times a day (BID) | ORAL | 0 refills | Status: DC

## 2016-04-05 MED ORDER — NAPROXEN 500 MG PO TABS
500.0000 mg | ORAL_TABLET | Freq: Once | ORAL | Status: AC
  Administered 2016-04-05: 500 mg via ORAL
  Filled 2016-04-05: qty 1

## 2016-04-05 NOTE — ED Notes (Signed)
Bed: [facility]29 Expected date:  Expected time:  Means of arrival:  Comments: 

## 2016-04-05 NOTE — ED Triage Notes (Addendum)
Pt reports R foot pain that radiates up leg for the past few months that has gradually gotten worse. No known injuries. Pain worse with putting on a shoe and bearing weight. No redness/swelling/deformity noted.

## 2016-04-05 NOTE — ED Provider Notes (Signed)
[facility]-EMERGENCY DEPT Provider Note   CSN: [id number] Arrival date & time: 04/05/16  1034  By signing my name below, I, Soijett Blue, attest that this documentation has been prepared under the direction and in the presence of [employer], [employer] Electronically Signed: Soijett Blue, ED Scribe. 04/05/16. 12:12 PM.  History   Chief Complaint Chief Complaint  Patient presents with  . Foot Pain    HPI TOI STELLY is a 45 y.o. female with a PMHx of arthritis, who presents to the Emergency Department complaining of right foot pain onset ~2 months. Pt describes right foot pain as 10/10, constant, aching, and it does radiate up her right lower leg. Pt notes that she has a moderate amount of pain to her right foot with her first steps of the day. She states that ambulation worsens her right foot pain and she denies any alleviating factors. She reports that she has tried tylenol, tramadol, and ice without relief for her symptoms. Pt is having associated symptoms of intermittent right foot swelling which improves with ice use. Denies fevers, chills, CP, SOB, abdominal pain, nausea, vomiting, diarrhea, constipation, dysuria, hematuria, numbness, tingling, weakness, color change, and any other symptoms. Pt denies any recent injury or trauma to her right foot.     The history is provided by the patient. No language interpreter was used.  Foot Pain  This is a recurrent problem. Episode onset: 2 months or so. The problem occurs constantly. The problem has not changed since onset.Pertinent negatives include no chest pain, no abdominal pain and no shortness of breath. The symptoms are aggravated by walking. Nothing relieves the symptoms. She has tried acetaminophen and a cold compress (tylenol and tramadol) for the symptoms. The treatment provided no relief.    Past Medical History:  Diagnosis Date  . 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 Asthma   . Seasonal allergies     Patient Active Problem  List   Diagnosis Date Noted  . Prediabetes 05/08/2015  . Essential hypertension 05/08/2015  . Mild intermittent asthma 05/06/2015  . Leukocytosis, steroid induced  04/29/2015  . Steroid-induced hyperglycemia 04/29/2015  . Asthma exacerbation 04/29/2015  . Accelerated hypertension 04/28/2015  . Anemia of chroic diseae, IDA 11/17/2013  . Bronchitis, acute 04/12/2012  . Acute asthma exacerbation 03/12/2012  . Acute respiratory failure (HCC) 03/12/2012    Past Surgical History:  Procedure Laterality Date  . CESAREAN SECTION     x 4  . CHOLECYSTECTOMY    . HYSTEROSCOPY W/D&C N/A 02/18/2015   Procedure: DILATATION AND CURETTAGE /HYSTEROSCOPY;  Surgeon: Allie Bossier, MD;  Location: [facility];  Service: Gynecology;  Laterality: N/A;  . TUBAL LIGATION      OB History    Gravida Para Term Preterm AB Living   5 4 4  0 1 3   SAB TAB Ectopic Multiple Live Births   1 0 0 0         Home Medications    Prior to Admission medications   Medication Sig Start Date End Date Taking? Authorizing Provider  albuterol (PROVENTIL) (2.5 MG/3ML) 0.083% nebulizer solution Take 3 mLs (2.5 mg total) by nebulization every 6 (six) hours as needed for wheezing or shortness of breath. Reported on 08/17/2015 11/02/15   Raeford Razor, MD  amLODipine (NORVASC) 5 MG tablet Take 1 tablet (5 mg total) by mouth daily. 11/07/15   Massie Maroon, FNP  azithromycin (ZITHROMAX) 250 MG tablet Take 1 tablet (250 mg total) by mouth  daily. Take first 2 tablets together, then 1 every day until finished. Patient not taking: Reported on 08/17/2015 07/10/15   Marlon Pel, PA-C  budesonide-formoterol [facility]) 80-4.5 MCG/ACT inhaler Inhale 2 puffs into the lungs 2 (two) times daily. 11/14/15   Quentin Angst, MD  cetirizine (ZYRTEC) 10 MG tablet Take 1 tablet (10 mg total) by mouth daily. 11/07/15   Massie Maroon, FNP  cyclobenzaprine (FLEXERIL) 10 MG tablet Take 1 tablet (10 mg total) by mouth 2 (two) times daily as needed  for muscle spasms. Patient not taking: Reported on 11/07/2015 08/17/15   Barrett Henle, PA-C  montelukast (SINGULAIR) 10 MG tablet Take 1 tablet (10 mg total) by mouth at bedtime. 11/07/15   Massie Maroon, FNP  naproxen (NAPROSYN) 375 MG tablet Take 1 tablet (375 mg total) by mouth 2 (two) times daily. Patient not taking: Reported on 11/07/2015 08/17/15   Barrett Henle, PA-C  predniSONE (DELTASONE) 50 MG tablet Take 1 tablet (50 mg total) by mouth daily. 11/22/15   Jerelyn Scott, MD  traMADol (ULTRAM) 50 MG tablet Take 1 tablet (50 mg total) by mouth every 6 (six) hours as needed. 02/18/16   Tatyana Kirichenko, PA-C  VENTOLIN HFA 108 (90 Base) MCG/ACT inhaler INHALE 2 PUFFS INTO THE LUNGS EVERY 6 HOURS AS NEEDED FOR WHEEZING OR SHORTNESS OF BREATH 02/23/16   Henrietta Hoover, NP    Family History Family History  Problem Relation Age of Onset  . Hypertension Mother   . Stroke Sister   . Seizures Sister     Social History Social History  Substance Use Topics  . Smoking status: Former Smoker    Packs/day: 0.75    Types: Cigarettes    Quit date: 11/17/1988  . Smokeless tobacco: Never Used  . Alcohol use No     Allergies   Shrimp [shellfish allergy]   Review of Systems Review of Systems  Constitutional: Negative for chills and fever.  Respiratory: Negative for shortness of breath.   Cardiovascular: Negative for chest pain.  Gastrointestinal: Negative for abdominal pain, constipation, diarrhea, nausea and vomiting.  Genitourinary: Negative for dysuria and hematuria.  Musculoskeletal: Positive for arthralgias (right foot pain) and joint swelling (right foot occasionally).  Skin: Negative for color change.  Allergic/Immunologic: Negative for immunocompromised state.  Neurological: Negative for weakness and numbness.  Psychiatric/Behavioral: Negative for confusion.   A complete 10 system review of systems was obtained and all systems are negative except as noted  in the HPI and PMH.   Physical Exam Updated Vital Signs BP 145/95   Pulse 72   Temp 98.7 F (37.1 C) (Oral)   Resp 16   SpO2 95%   Physical Exam  Constitutional: She is oriented to person, place, and time. Vital signs are normal. She appears well-developed and well-nourished.  Non-toxic appearance. No distress.  Afebrile, nontoxic, NAD  HENT:  Head: Normocephalic and atraumatic.  Mouth/Throat: Mucous membranes are normal.  Eyes: Conjunctivae and EOM are normal. Right eye exhibits no discharge. Left eye exhibits no discharge.  Neck: Normal range of motion. Neck supple.  Cardiovascular: Normal rate and intact distal pulses.   Pulmonary/Chest: Effort normal. No respiratory distress.  Abdominal: Normal appearance. She exhibits no distension.  Musculoskeletal: Normal range of motion.       Right foot: There is tenderness and bony tenderness. There is normal range of motion, no swelling, normal capillary refill, no crepitus, no deformity and no laceration.       Feet:  Right foot with FROM intact in all digits with moderate TTP to the heel at the insertion of the plantar fascia extending into the arch, no other focal bony TTP of the remainder of the foot, no swelling or crepitus, no deformity, no redness or bruising, skin intact, Strength and sensation grossly intact. Distal pulses intact, compartments soft.   Neurological: She is alert and oriented to person, place, and time. She has normal strength. No sensory deficit.  Skin: Skin is warm, dry and intact. No rash noted.  Psychiatric: She has a normal mood and affect. Her behavior is normal.  Nursing note and vitals reviewed.    ED Treatments / Results  DIAGNOSTIC STUDIES: Oxygen Saturation is 95% on RA, adequate by my interpretation.    COORDINATION OF CARE: 12:49 PM Discussed treatment plan with pt at bedside which includes referral and follow up with orthopedist, naprosyn Rx, and pt agreed to plan.   Radiology Dg Foot  Complete Right  Result Date: 04/05/2016 CLINICAL DATA:  Right heel pain, pain travels caudally up to right hip when stepping down on right foot. Pain increased for 2 days EXAM: RIGHT FOOT COMPLETE - 3+ VIEW COMPARISON:  Ankle radiographs 02/18/2016 FINDINGS: AP obliques and lateral views of the right foot. No acute displaced fracture or malalignment. There is mild degenerative change at the first MTP joint with mild narrowing, sclerosis and osteophyte. There is a moderate plantar calcaneal spur. This is similar compared to prior. Soft tissues are unremarkable. IMPRESSION: 1. No acute osseous abnormality 2. Mild degenerative changes at the first MTP joint 3. Moderate plantar calcaneal spur Electronically Signed   By: Jasmine Pang M.D.   On: 04/05/2016 12:07    Procedures Procedures (including critical care time)  Medications Ordered in ED Medications  naproxen (NAPROSYN) tablet 500 mg (500 mg Oral Given 04/05/16 1249)     Initial Impression / Assessment and Plan / ED Course  I have reviewed the triage vital signs and the nursing notes.  Pertinent imaging results that were available during my care of the patient were reviewed by me and considered in my medical decision making (see chart for details).  Clinical Course    45 y.o. female here with several months of R foot pain, tenderness to heel where plantar fascia inserts, no erythema or warmth, no swelling, NVI with soft compartments. Exam consistent with plantar fasciitis. Xray done prior to my exam, but confirms presence of heel spur which goes along with diagnosis. Discussed course of NSAIDs, ice water bottle stretches, OTC insoles and night splint, and f/up with ortho in 1-2wks for ongoing management. Additional tylenol PRN for pain. I explained the diagnosis and have given explicit precautions to return to the ER including for any other new or worsening symptoms. The patient understands and accepts the medical plan as it's been dictated and I  have answered their questions. Discharge instructions concerning home care and prescriptions have been given. The patient is STABLE and is discharged to home in good condition.   Final Clinical Impressions(s) / ED Diagnoses   Final diagnoses:  Foot pain, right  Plantar fasciitis of right foot  Heel spur, right    New Prescriptions New Prescriptions   NAPROXEN (NAPROSYN) 500 MG TABLET    Take 1 tablet (500 mg total) by mouth 2 (two) times daily with a meal. x2 weeks   I personally performed the services described in this documentation, which was scribed in my presence. The recorded information has been reviewed and is  accurate.     [id number]Quiara Killian Pine Ridgeamprubi-Soms, PA-C 04/05/16 1306    Gerhard Munchobert Lockwood, MD 04/05/16 1756

## 2016-04-05 NOTE — Discharge Instructions (Signed)
Your pain is likely due to plantar fasciitis. Perform ice stretches with a water bottle as discussed today, at least 2 times daily. You may consider getting a orthopedic insole to use in your shoes, and an over-the-counter splint for plantar fasciitis to help with symptoms. Take naprosyn as directed for 2 weeks. Use additional tylenol as needed for pain. Follow up with the orthopedist listed above in 1-2 weeks for recheck and ongoing management of your condition. Return to the ER for changes or worsening symptoms.

## 2016-04-17 MED FILL — VENTOLIN HFA 90 MCG INHALER: 108 (90 BAS | 30 days supply | Qty: 18 | Fill #1

## 2016-04-17 MED FILL — MONTELUKAST SOD 10 MG TAB: 10 | 30 days supply | Qty: 30 | Fill #1

## 2016-04-17 MED FILL — ALBUTEROL SUL 2.5 MG/3 ML S: (2.5 MG/3ML | 8 days supply | Qty: 90 | Fill #2

## 2016-04-24 ENCOUNTER — Emergency Department (HOSPITAL_COMMUNITY)
Admission: EM | Admit: 2016-04-24 | Discharge: 2016-04-24 | Disposition: A | Discharge status: Home / Self Care | Payer: 59 | Attending: Emergency Medicine | Admitting: Emergency Medicine

## 2016-04-24 ENCOUNTER — Encounter (HOSPITAL_COMMUNITY): Payer: Self-pay

## 2016-04-24 DIAGNOSIS — I10 Essential (primary) hypertension: Secondary | ICD-10-CM | POA: Insufficient documentation

## 2016-04-24 DIAGNOSIS — Z79899 Other long term (current) drug therapy: Secondary | ICD-10-CM | POA: Insufficient documentation

## 2016-04-24 DIAGNOSIS — M722 Plantar fascial fibromatosis: Secondary | ICD-10-CM | POA: Insufficient documentation

## 2016-04-24 DIAGNOSIS — Z87891 Personal history of nicotine dependence: Secondary | ICD-10-CM | POA: Insufficient documentation

## 2016-04-24 DIAGNOSIS — Z792 Long term (current) use of antibiotics: Secondary | ICD-10-CM | POA: Insufficient documentation

## 2016-04-24 DIAGNOSIS — J45901 Unspecified asthma with (acute) exacerbation: Secondary | ICD-10-CM | POA: Insufficient documentation

## 2016-04-24 MED ORDER — MELOXICAM 15 MG PO TABS: 15.0000 mg | ORAL_TABLET | Freq: Every day | ORAL | 0 refills | Status: DC

## 2016-04-24 MED ORDER — HYDROCODONE-ACETAMINOPHEN 5-325 MG PO TABS
1.0000 | ORAL_TABLET | Freq: Once | ORAL | Status: AC
  Administered 2016-04-24: 1 via ORAL
  Filled 2016-04-24: qty 1

## 2016-04-24 NOTE — ED Provider Notes (Signed)
[facility]-EMERGENCY DEPT Provider Note   CSN: [id number] Arrival date & time: 04/24/16  1214  By signing my name below, I, Freida Busman, attest that this documentation has been prepared under the direction and in the presence of non-physician practitioner, Arthor Captain, PA-C. Electronically Signed: Freida Busman, Scribe. 04/24/2016. 12:54 PM.   History   Chief Complaint Chief Complaint  Patient presents with  . Foot Pain     The history is provided by the patient. No language interpreter was used.   HPI Comments:  Wendy James is a 45 y.o. female with a history of arthritis, who presents to the Emergency Department complaining of constant, atraumatic, bilateral foot pain (R>L). She has been experiencing the pain in the right foot for ~ 2 months and the pain in her left foot x a few weeks;  notes her pain is progressively worsening. Her pain is worse when she takes her first steps after she wakes in the morning or after long periods of being seated. Pt ices her feet once a day with minimal relief. Pt was evaluated for right foot pain on 04/05/2016 and had an XR of the right foot that showed heel spurs. She was discharged with Naprosyn which has provided very little relief.  Pt notes her insurance does not take effect until the end of October 2017 and cannot follow up with a PCP until then.   Past Medical History:  Diagnosis Date  . 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 Asthma   . Seasonal allergies     Patient Active Problem List   Diagnosis Date Noted  . Prediabetes 05/08/2015  . Essential hypertension 05/08/2015  . Mild intermittent asthma 05/06/2015  . Leukocytosis, steroid induced  04/29/2015  . Steroid-induced hyperglycemia 04/29/2015  . Asthma exacerbation 04/29/2015  . Accelerated hypertension 04/28/2015  . Anemia of chroic diseae, IDA 11/17/2013  . Bronchitis, acute 04/12/2012  . Acute asthma exacerbation 03/12/2012  . Acute respiratory failure (HCC) 03/12/2012    Past  Surgical History:  Procedure Laterality Date  . CESAREAN SECTION     x 4  . CHOLECYSTECTOMY    . HYSTEROSCOPY W/D&C N/A 02/18/2015   Procedure: DILATATION AND CURETTAGE /HYSTEROSCOPY;  Surgeon: Allie Bossier, MD;  Location: [facility];  Service: Gynecology;  Laterality: N/A;  . TUBAL LIGATION      OB History    Gravida Para Term Preterm AB Living   5 4 4  0 1 3   SAB TAB Ectopic Multiple Live Births   1 0 0 0         Home Medications    Prior to Admission medications   Medication Sig Start Date End Date Taking? Authorizing Provider  albuterol (PROVENTIL) (2.5 MG/3ML) 0.083% nebulizer solution Take 3 mLs (2.5 mg total) by nebulization every 6 (six) hours as needed for wheezing or shortness of breath. Reported on 08/17/2015 11/02/15   Raeford Razor, MD  amLODipine (NORVASC) 5 MG tablet Take 1 tablet (5 mg total) by mouth daily. 11/07/15   Massie Maroon, FNP  azithromycin (ZITHROMAX) 250 MG tablet Take 1 tablet (250 mg total) by mouth daily. Take first 2 tablets together, then 1 every day until finished. Patient not taking: Reported on 08/17/2015 07/10/15   Marlon Pel, PA-C  budesonide-formoterol [facility]) 80-4.5 MCG/ACT inhaler Inhale 2 puffs into the lungs 2 (two) times daily. 11/14/15   Quentin Angst, MD  cetirizine (ZYRTEC) 10 MG tablet Take 1 tablet (10 mg total) by mouth daily.  11/07/15   Massie Maroon, FNP  cyclobenzaprine (FLEXERIL) 10 MG tablet Take 1 tablet (10 mg total) by mouth 2 (two) times daily as needed for muscle spasms. Patient not taking: Reported on 11/07/2015 08/17/15   Barrett Henle, PA-C  montelukast (SINGULAIR) 10 MG tablet Take 1 tablet (10 mg total) by mouth at bedtime. 11/07/15   Massie Maroon, FNP  naproxen (NAPROSYN) 375 MG tablet Take 1 tablet (375 mg total) by mouth 2 (two) times daily. Patient not taking: Reported on 11/07/2015 08/17/15   Barrett Henle, PA-C  naproxen (NAPROSYN) 500 MG tablet Take 1 tablet (500 mg total) by mouth 2  (two) times daily with a meal. x2 weeks 04/05/16   Mercedes Camprubi-Soms, PA-C  predniSONE (DELTASONE) 50 MG tablet Take 1 tablet (50 mg total) by mouth daily. 11/22/15   Jerelyn Scott, MD  traMADol (ULTRAM) 50 MG tablet Take 1 tablet (50 mg total) by mouth every 6 (six) hours as needed. 02/18/16   Tatyana Kirichenko, PA-C  VENTOLIN HFA 108 (90 Base) MCG/ACT inhaler INHALE 2 PUFFS INTO THE LUNGS EVERY 6 HOURS AS NEEDED FOR WHEEZING OR SHORTNESS OF BREATH 02/23/16   Henrietta Hoover, NP    Family History Family History  Problem Relation Age of Onset  . Hypertension Mother   . Stroke Sister   . Seizures Sister     Social History Social History  Substance Use Topics  . Smoking status: Former Smoker    Packs/day: 0.75    Types: Cigarettes    Quit date: 11/17/1988  . Smokeless tobacco: Never Used  . Alcohol use No     Allergies   Shrimp [shellfish allergy]   Review of Systems Review of Systems  Constitutional: Negative for chills and fever.  Respiratory: Negative for shortness of breath.   Cardiovascular: Negative for chest pain.  Musculoskeletal: Positive for arthralgias and myalgias.       Bilateral feet  Neurological: Negative for weakness.     Physical Exam Updated Vital Signs BP 130/94 (BP Location: Right Arm)   Pulse 71   Temp 98.8 F (37.1 C) (Oral)   Resp 18   SpO2 98%   Physical Exam  Constitutional: She is oriented to person, place, and time. She appears well-developed and well-nourished. No distress.  HENT:  Head: Normocephalic and atraumatic.  Eyes: Conjunctivae are normal.  Cardiovascular: Normal rate.   Pulmonary/Chest: Effort normal.  Abdominal: She exhibits no distension.  Musculoskeletal: She exhibits tenderness.  exqusitely tender along the base of her right foot  Pain with passive dorsiflexion of the right foot point tenderness over right heel No erythema or swelling Left foot is non-tender  Neurological: She is alert and oriented to person,  place, and time.  Skin: Skin is warm and dry.  Psychiatric: She has a normal mood and affect.  Nursing note and vitals reviewed.    ED Treatments / Results  DIAGNOSTIC STUDIES:  Oxygen Saturation is 98% on RA, normal by my interpretation.    COORDINATION OF CARE:  12:50 PM Pt advised to ice her feet and instructed to uses various stretches to help with pain. Will discharged with Rx for Mobic. Discussed treatment plan with pt at bedside and pt agreed to plan.  Radiology No results found.  Procedures Procedures (including critical care time)  Medications Ordered in ED Medications - No data to display   Initial Impression / Assessment and Plan / ED Course  I have reviewed the triage vital signs and the  nursing notes.  Pertinent labs & imaging results that were available during my care of the patient were reviewed by me and considered in my medical decision making (see chart for details).  Clinical Course    I personally performed the services described in this documentation, which was scribed in my presence. The recorded information has been reviewed and is accurate.    Patient presents with atraumatic bilateral foot pain. Conservative therapy recommended and discussed. Will discharge with Mobic. Pt advised to follow up with podiatrist. Patient will be discharged home & is agreeable with above plan. Returns precautions discussed. Pt appears safe for discharge.  Final Clinical Impressions(s) / ED Diagnoses   Final diagnoses:  Plantar fasciitis of right foot   Patient with plantar fasciitis. No signs of infection. No signs of DVT. Thoroughly discussed home therapy.  New Prescriptions New Prescriptions   No medications on file     Arthor Captainbigail Marquetta Weiskopf, PA-C 04/24/16 1304    Rolland PorterMark James, MD 04/28/16 2253

## 2016-04-24 NOTE — Discharge Instructions (Signed)
Ice your foot at least 3 times a day for 15 to 20 min.

## 2016-04-24 NOTE — ED Triage Notes (Signed)
Pt here with bilateral foot pain.  Pt states seen 2 weeks ago with same and told she has bone spurs.  Pt told to come back if worsens.

## 2016-05-14 ENCOUNTER — Emergency Department (HOSPITAL_COMMUNITY)
Admission: EM | Admit: 2016-05-14 | Discharge: 2016-05-14 | Disposition: A | Discharge status: Home / Self Care | Payer: 59 | Attending: Emergency Medicine | Admitting: Emergency Medicine

## 2016-05-14 ENCOUNTER — Encounter: Payer: Self-pay | Admitting: Emergency Medicine

## 2016-05-14 DIAGNOSIS — J45909 Unspecified asthma, uncomplicated: Secondary | ICD-10-CM | POA: Insufficient documentation

## 2016-05-14 DIAGNOSIS — Z87891 Personal history of nicotine dependence: Secondary | ICD-10-CM | POA: Insufficient documentation

## 2016-05-14 DIAGNOSIS — Z79899 Other long term (current) drug therapy: Secondary | ICD-10-CM | POA: Insufficient documentation

## 2016-05-14 DIAGNOSIS — M5441 Lumbago with sciatica, right side: Secondary | ICD-10-CM

## 2016-05-14 DIAGNOSIS — I10 Essential (primary) hypertension: Secondary | ICD-10-CM | POA: Insufficient documentation

## 2016-05-14 MED ORDER — MELOXICAM 7.5 MG PO TABS: 7.5000 mg | ORAL_TABLET | Freq: Every day | ORAL | 0 refills | Status: DC | PRN

## 2016-05-14 MED ORDER — PREDNISONE 10 MG (21) PO TBPK: 10.0000 mg | ORAL_TABLET | Freq: Every day | ORAL | 0 refills | Status: DC

## 2016-05-14 MED ORDER — KETOROLAC TROMETHAMINE 60 MG/2ML IM SOLN
60.0000 mg | Freq: Once | INTRAMUSCULAR | Status: AC
  Administered 2016-05-14: 60 mg via INTRAMUSCULAR
  Filled 2016-05-14: qty 2

## 2016-05-14 MED ORDER — METHOCARBAMOL 500 MG PO TABS: 500.0000 mg | ORAL_TABLET | Freq: Four times a day (QID) | ORAL | 0 refills | Status: DC | PRN

## 2016-05-14 MED ORDER — LIDOCAINE 5 % EX PTCH: 1.0000 | MEDICATED_PATCH | CUTANEOUS | 0 refills | Status: DC

## 2016-05-14 MED ORDER — TRAMADOL HCL 50 MG PO TABS: 50.0000 mg | ORAL_TABLET | Freq: Four times a day (QID) | ORAL | 0 refills | Status: DC | PRN

## 2016-05-14 MED FILL — VENTOLIN HFA 90 MCG INHALER: 108 (90 BAS | 30 days supply | Qty: 18 | Fill #2

## 2016-05-14 NOTE — ED Triage Notes (Signed)
Pt states she is having right leg pain that started yesterday without any injury to the leg. Pt states it is difficult to walk due to the pain. Shooting pain from her hip area.

## 2016-05-14 NOTE — Discharge Instructions (Signed)
Read the information below.  Use the prescribed medication as directed.  Please discuss all new medications with your pharmacist.  You may return to the Emergency Department at any time for worsening condition or any new symptoms that concern you.    If you develop fevers, loss of control of bowel or bladder, weakness or numbness in your legs, or are unable to walk, return to the ER for a recheck.  °

## 2016-05-14 NOTE — ED Notes (Signed)
Declined W/C at D/C and was escorted to lobby by RN. 

## 2016-05-14 NOTE — ED Provider Notes (Signed)
[facility]-EMERGENCY DEPT Provider Note   CSN: [id number] Arrival date & time: 05/14/16  4098     History   Chief Complaint Chief Complaint  Patient presents with  . Leg Pain    HPI AISA Wendy James is a 45 y.o. female.  HPI   Patient presents with low back pain that radiates into her right thigh.  It began last night and has been constant.  Pain is worse with sitting, changing positions, walking, palpation.  She has taken nothing for the pain.  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  Denies leg swelling.   Denies fevers, chills, abdominal pain, loss of control of bowel or bladder, weakness of numbness of the extremities, saddle anesthesia, bowel, urinary, or vaginal complaints.   No hx CA, denies IVDU.   Past Medical History:  Diagnosis Date  . 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 Asthma   . Seasonal allergies     Patient Active Problem List   Diagnosis Date Noted  . Prediabetes 05/08/2015  . Essential hypertension 05/08/2015  . Mild intermittent asthma 05/06/2015  . Leukocytosis, steroid induced  04/29/2015  . Steroid-induced hyperglycemia 04/29/2015  . Asthma exacerbation 04/29/2015  . Accelerated hypertension 04/28/2015  . Anemia of chroic diseae, IDA 11/17/2013  . Bronchitis, acute 04/12/2012  . Acute asthma exacerbation 03/12/2012  . Acute respiratory failure (HCC) 03/12/2012    Past Surgical History:  Procedure Laterality Date  . CESAREAN SECTION     x 4  . CHOLECYSTECTOMY    . HYSTEROSCOPY W/D&C N/A 02/18/2015   Procedure: DILATATION AND CURETTAGE /HYSTEROSCOPY;  Surgeon: Allie Bossier, MD;  Location: [facility];  Service: Gynecology;  Laterality: N/A;  . TUBAL LIGATION      OB History    Gravida Para Term Preterm AB Living   5 4 4  0 1 3   SAB TAB Ectopic Multiple Live Births   1 0 0 0         Home Medications    Prior to Admission medications   Medication Sig Start Date End Date Taking? Authorizing Provider   albuterol (PROVENTIL) (2.5 MG/3ML) 0.083% nebulizer solution Take 3 mLs (2.5 mg total) by nebulization every 6 (six) hours as needed for wheezing or shortness of breath. Reported on 08/17/2015 11/02/15   Raeford Razor, MD  amLODipine (NORVASC) 5 MG tablet Take 1 tablet (5 mg total) by mouth daily. 11/07/15   Massie Maroon, FNP  azithromycin (ZITHROMAX) 250 MG tablet Take 1 tablet (250 mg total) by mouth daily. Take first 2 tablets together, then 1 every day until finished. Patient not taking: Reported on 08/17/2015 07/10/15   Marlon Pel, PA-C  budesonide-formoterol [facility]) 80-4.5 MCG/ACT inhaler Inhale 2 puffs into the lungs 2 (two) times daily. 11/14/15   Quentin Angst, MD  cetirizine (ZYRTEC) 10 MG tablet Take 1 tablet (10 mg total) by mouth daily. 11/07/15   Massie Maroon, FNP  cyclobenzaprine (FLEXERIL) 10 MG tablet Take 1 tablet (10 mg total) by mouth 2 (two) times daily as needed for muscle spasms. Patient not taking: Reported on 11/07/2015 08/17/15   Barrett Henle, PA-C  meloxicam (MOBIC) 7.5 MG tablet Take 1-2 tablets (7.5-15 mg total) by mouth daily as needed for pain. 05/14/16   Trixie Dredge, PA-C  methocarbamol (ROBAXIN) 500 MG tablet Take 1-2 tablets (500-1,000 mg total) by mouth every 6 (six) hours as needed. 05/14/16   Trixie Dredge, PA-C  montelukast (  SINGULAIR) 10 MG tablet Take 1 tablet (10 mg total) by mouth at bedtime. 11/07/15   Massie Maroon, FNP  predniSONE (STERAPRED UNI-PAK 21 TAB) 10 MG (21) TBPK tablet Take 1 tablet (10 mg total) by mouth daily. Day 1: take 6 tabs.  Day 2: 5 tabs  Day 3: 4 tabs  Day 4: 3 tabs  Day 5: 2 tabs  Day 6: 1 tab 05/14/16   Trixie Dredge, PA-C  traMADol (ULTRAM) 50 MG tablet Take 1 tablet (50 mg total) by mouth every 6 (six) hours as needed. 02/18/16   Tatyana Kirichenko, PA-C  VENTOLIN HFA 108 (90 Base) MCG/ACT inhaler INHALE 2 PUFFS INTO THE LUNGS EVERY 6 HOURS AS NEEDED FOR WHEEZING OR SHORTNESS OF BREATH 02/23/16   Henrietta Hoover,  NP    Family History Family History  Problem Relation Age of Onset  . Hypertension Mother   . Stroke Sister   . Seizures Sister     Social History Social History  Substance Use Topics  . Smoking status: Former Smoker    Packs/day: 0.75    Types: Cigarettes    Quit date: 11/17/1988  . Smokeless tobacco: Never Used  . Alcohol use No     Allergies   Shrimp [shellfish allergy]   Review of Systems Review of Systems  All other systems reviewed and are negative.    Physical Exam Updated Vital Signs BP 140/95 (BP Location: Right Arm)   Pulse 75   Temp 98.2 F (36.8 C) (Oral)   Resp 15   Ht 5\' 2"  (1.575 m)   Wt 93.9 kg   LMP 05/07/2016   SpO2 98%   BMI 37.86 kg/m   Physical Exam  Constitutional: She appears well-developed and well-nourished. No distress.  HENT:  Head: Normocephalic and atraumatic.  Neck: Neck supple.  Pulmonary/Chest: Effort normal.  Abdominal: Soft. She exhibits no distension and no mass. There is no tenderness. There is no rebound and no guarding.  Musculoskeletal:  Spine no crepitus, or stepoffs.  Tenderness throughout low back and into right thigh, posterior and anterior.  No leg swelling.  No erythema, warmth.   Lower extremities:  Strength 5/5, sensation intact, distal pulses intact.     Neurological: She is alert.  Limping gait   Skin: She is not diaphoretic.  Nursing note and vitals reviewed.    ED Treatments / Results  Labs (all labs ordered are listed, but only abnormal results are displayed) Labs Reviewed - No data to display  EKG  EKG Interpretation None       Radiology No results found.  Procedures Procedures (including critical care time)  Medications Ordered in ED Medications  ketorolac (TORADOL) injection 60 mg (not administered)     Initial Impression / Assessment and Plan / ED Course  I have reviewed the triage vital signs and the nursing notes.  Pertinent labs & imaging results that were available  during my care of the patient were reviewed by me and considered in my medical decision making (see chart for details).  Clinical Course    Afebrile, nontoxic patient with mechanical low back pain. Likely sciatica.  No red flags.  Given toradol in ED.  D/C home with prednisone, mobic, robaxin, PCP follow up.  Discussed result, findings, treatment, and follow up  with patient.  Pt given return precautions.  Pt verbalizes understanding and agrees with plan.       Final Clinical Impressions(s) / ED Diagnoses   Final diagnoses:  Acute right-sided  low back pain with right-sided sciatica    New Prescriptions New Prescriptions   MELOXICAM (MOBIC) 7.5 MG TABLET    Take 1-2 tablets (7.5-15 mg total) by mouth daily as needed for pain.   METHOCARBAMOL (ROBAXIN) 500 MG TABLET    Take 1-2 tablets (500-1,000 mg total) by mouth every 6 (six) hours as needed.   PREDNISONE (STERAPRED UNI-PAK 21 TAB) 10 MG (21) TBPK TABLET    Take 1 tablet (10 mg total) by mouth daily. Day 1: take 6 tabs.  Day 2: 5 tabs  Day 3: 4 tabs  Day 4: 3 tabs  Day 5: 2 tabs  Day 6: 1 tab     Trixie DredgeEmily Dhillon [place], PA-C 05/14/16 0813    Trixie DredgeEmily Alianna Wurster, PA-C 05/14/16 0827    Canary Brimhristopher J Tegeler, MD 05/14/16 2213

## 2016-05-14 NOTE — ED Triage Notes (Signed)
AT time of DC Pt reported she was allergic to Mobic. This med was not listed on Pt allergy  list. PA was contacted for a RX for a different pain med. Pt updated on reason for extra wait time.

## 2016-08-23 ENCOUNTER — Emergency Department (HOSPITAL_COMMUNITY)
Admission: EM | Admit: 2016-08-23 | Discharge: 2016-08-23 | Disposition: A | Discharge status: Home / Self Care | Payer: BLUE CROSS/BLUE SHIELD | Attending: Emergency Medicine | Admitting: Emergency Medicine

## 2016-08-23 ENCOUNTER — Encounter (HOSPITAL_COMMUNITY): Payer: Self-pay | Admitting: Emergency Medicine

## 2016-08-23 DIAGNOSIS — R197 Diarrhea, unspecified: Secondary | ICD-10-CM | POA: Diagnosis not present

## 2016-08-23 DIAGNOSIS — I10 Essential (primary) hypertension: Secondary | ICD-10-CM | POA: Insufficient documentation

## 2016-08-23 DIAGNOSIS — R112 Nausea with vomiting, unspecified: Secondary | ICD-10-CM | POA: Insufficient documentation

## 2016-08-23 DIAGNOSIS — Z87891 Personal history of nicotine dependence: Secondary | ICD-10-CM | POA: Insufficient documentation

## 2016-08-23 DIAGNOSIS — J45909 Unspecified asthma, uncomplicated: Secondary | ICD-10-CM | POA: Insufficient documentation

## 2016-08-23 DIAGNOSIS — R109 Unspecified abdominal pain: Secondary | ICD-10-CM | POA: Diagnosis present

## 2016-08-23 LAB — URINALYSIS, ROUTINE W REFLEX MICROSCOPIC
Bilirubin Urine: NEGATIVE
GLUCOSE, UA: NEGATIVE mg/dL
Hgb urine dipstick: NEGATIVE
KETONES UR: NEGATIVE mg/dL
Nitrite: NEGATIVE
PROTEIN: 30 mg/dL — AB
Specific Gravity, Urine: 1.024 (ref 1.005–1.030)
pH: 5 (ref 5.0–8.0)

## 2016-08-23 LAB — I-STAT BETA HCG BLOOD, ED (MC, WL, AP ONLY)

## 2016-08-23 LAB — COMPREHENSIVE METABOLIC PANEL
ALBUMIN: 4.4 g/dL (ref 3.5–5.0)
ALK PHOS: 58 U/L (ref 38–126)
ALT: 58 U/L — ABNORMAL HIGH (ref 14–54)
ANION GAP: 9 (ref 5–15)
AST: 39 U/L (ref 15–41)
BUN: 16 mg/dL (ref 6–20)
CALCIUM: 8.7 mg/dL — AB (ref 8.9–10.3)
CHLORIDE: 105 mmol/L (ref 101–111)
CO2: 25 mmol/L (ref 22–32)
Creatinine, Ser: 0.5 mg/dL (ref 0.44–1.00)
GFR calc non Af Amer: >60 mL/min (ref >60)
GLUCOSE: 122 mg/dL — AB (ref 65–99)
POTASSIUM: 3.7 mmol/L (ref 3.5–5.1)
SODIUM: 139 mmol/L (ref 135–145)
Total Bilirubin: 2 mg/dL — ABNORMAL HIGH (ref 0.3–1.2)
Total Protein: 8.3 g/dL — ABNORMAL HIGH (ref 6.5–8.1)

## 2016-08-23 LAB — CBC
HEMATOCRIT: 39.8 % (ref 36.0–46.0)
HEMOGLOBIN: 13.2 g/dL (ref 12.0–15.0)
MCH: 29.1 pg (ref 26.0–34.0)
MCHC: 33.2 g/dL (ref 30.0–36.0)
MCV: 87.7 fL (ref 78.0–100.0)
Platelets: 212 10*3/uL (ref 150–400)
RBC: 4.54 MIL/uL (ref 3.87–5.11)
RDW: 14.8 % (ref 11.5–15.5)
WBC: 5.8 10*3/uL (ref 4.0–10.5)

## 2016-08-23 LAB — LIPASE, BLOOD: LIPASE: 29 U/L (ref 11–51)

## 2016-08-23 MED ORDER — PROMETHAZINE HCL 25 MG/ML IJ SOLN
25.0000 mg | Freq: Once | INTRAMUSCULAR | Status: AC
  Administered 2016-08-23: 25 mg via INTRAVENOUS
  Filled 2016-08-23: qty 1

## 2016-08-23 MED ORDER — PROMETHAZINE HCL 25 MG PO TABS: 25.0000 mg | ORAL_TABLET | Freq: Four times a day (QID) | ORAL | 0 refills | Status: DC | PRN

## 2016-08-23 MED ORDER — SODIUM CHLORIDE 0.9 % IV BOLUS (SEPSIS)
1000.0000 mL | Freq: Once | INTRAVENOUS | Status: AC
  Administered 2016-08-23: 1000 mL via INTRAVENOUS

## 2016-08-23 MED ORDER — FAMOTIDINE IN NACL 20-0.9 MG/50ML-% IV SOLN
20.0000 mg | Freq: Once | INTRAVENOUS | Status: AC
  Administered 2016-08-23: 20 mg via INTRAVENOUS
  Filled 2016-08-23: qty 50

## 2016-08-23 MED ORDER — MORPHINE SULFATE (PF) 4 MG/ML IV SOLN
4.0000 mg | Freq: Once | INTRAVENOUS | Status: AC
  Administered 2016-08-23: 4 mg via INTRAVENOUS
  Filled 2016-08-23: qty 1

## 2016-08-23 MED ORDER — ONDANSETRON HCL 4 MG/2ML IJ SOLN
4.0000 mg | Freq: Once | INTRAMUSCULAR | Status: AC
  Administered 2016-08-23: 4 mg via INTRAVENOUS
  Filled 2016-08-23: qty 2

## 2016-08-23 NOTE — ED Provider Notes (Signed)
[facility]-EMERGENCY DEPT Provider Note   CSN: [id number] Arrival date & time: 08/23/16  0543     History   Chief Complaint Chief Complaint  Patient presents with  . Abdominal Pain    HPI Wendy HawkingLynette M James is a 46 y.o. female.  Pt presents to the ED today with n/v/d.  Pt said that it started last night and had been going on all night.  The pt denies any abdominal pain.  She denies any recent abx or travels.  No known sick contacts.      Past Medical History:  Diagnosis Date  . 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 Asthma   . Seasonal allergies     Patient Active Problem List   Diagnosis Date Noted  . Prediabetes 05/08/2015  . Essential hypertension 05/08/2015  . Mild intermittent asthma 05/06/2015  . Leukocytosis, steroid induced  04/29/2015  . Steroid-induced hyperglycemia 04/29/2015  . Asthma exacerbation 04/29/2015  . Accelerated hypertension 04/28/2015  . Anemia of chroic diseae, IDA 11/17/2013  . Bronchitis, acute 04/12/2012  . Acute asthma exacerbation 03/12/2012  . Acute respiratory failure (HCC) 03/12/2012    Past Surgical History:  Procedure Laterality Date  . CESAREAN SECTION     x 4  . CHOLECYSTECTOMY    . HYSTEROSCOPY W/D&C N/A 02/18/2015   Procedure: DILATATION AND CURETTAGE /HYSTEROSCOPY;  Surgeon: Allie BossierMyra C Dove, MD;  Location: [facility];  Service: Gynecology;  Laterality: N/A;  . TUBAL LIGATION      OB History    Gravida Para Term Preterm AB Living   5 4 4  0 1 3   SAB TAB Ectopic Multiple Live Births   1 0 0 0         Home Medications    Prior to Admission medications   Medication Sig Start Date End Date Taking? Authorizing Provider  acetaminophen (TYLENOL) 500 MG tablet Take 1,000 mg by mouth every 6 (six) hours as needed for mild pain or headache.   Yes Historical Provider, MD  albuterol (PROVENTIL) (2.5 MG/3ML) 0.083% nebulizer solution Take 3 mLs (2.5 mg total) by nebulization every 6 (six) hours as needed for wheezing or shortness of breath.  Reported on 08/17/2015 11/02/15  Yes Raeford RazorStephen Kohut, MD  cetirizine (ZYRTEC) 10 MG tablet Take 1 tablet (10 mg total) by mouth daily. 11/07/15  Yes Massie MaroonLachina M Hollis, FNP  VENTOLIN HFA 108 (90 Base) MCG/ACT inhaler INHALE 2 PUFFS INTO THE LUNGS EVERY 6 HOURS AS NEEDED FOR WHEEZING OR SHORTNESS OF BREATH 02/23/16  Yes Henrietta HooverLinda C Bernhardt, NP  amLODipine (NORVASC) 5 MG tablet Take 1 tablet (5 mg total) by mouth daily. Patient not taking: Reported on 08/23/2016 11/07/15   Massie MaroonLachina M Hollis, FNP  azithromycin (ZITHROMAX) 250 MG tablet Take 1 tablet (250 mg total) by mouth daily. Take first 2 tablets together, then 1 every day until finished. Patient not taking: Reported on 08/17/2015 07/10/15   Marlon Peliffany Greene, PA-C  budesonide-formoterol [facility](SYMBICORT) 80-4.5 MCG/ACT inhaler Inhale 2 puffs into the lungs 2 (two) times daily. Patient not taking: Reported on 08/23/2016 11/14/15   Quentin Angstlugbemiga E Jegede, MD  cyclobenzaprine (FLEXERIL) 10 MG tablet Take 1 tablet (10 mg total) by mouth 2 (two) times daily as needed for muscle spasms. Patient not taking: Reported on 11/07/2015 08/17/15   Barrett HenleNicole Elizabeth Nadeau, PA-C  lidocaine (LIDODERM) 5 % Place 1 patch onto the skin daily. Remove & Discard patch within 12 hours or as directed by MD Patient not taking: Reported on 08/23/2016  05/14/16   Trixie Dredge, PA-C  methocarbamol (ROBAXIN) 500 MG tablet Take 1-2 tablets (500-1,000 mg total) by mouth every 6 (six) hours as needed (pain). Patient not taking: Reported on 08/23/2016 05/14/16   Trixie Dredge, PA-C  montelukast (SINGULAIR) 10 MG tablet Take 1 tablet (10 mg total) by mouth at bedtime. Patient not taking: Reported on 08/23/2016 11/07/15   Massie Maroon, FNP  predniSONE (STERAPRED UNI-PAK 21 TAB) 10 MG (21) TBPK tablet Take 1 tablet (10 mg total) by mouth daily. Day 1: take 6 tabs.  Day 2: 5 tabs  Day 3: 4 tabs  Day 4: 3 tabs  Day 5: 2 tabs  Day 6: 1 tab Patient not taking: Reported on 08/23/2016 05/14/16   Trixie Dredge, PA-C    promethazine (PHENERGAN) 25 MG tablet Take 1 tablet (25 mg total) by mouth every 6 (six) hours as needed for nausea or vomiting. 08/23/16   Jacalyn Lefevre, MD  traMADol (ULTRAM) 50 MG tablet Take 1 tablet (50 mg total) by mouth every 6 (six) hours as needed for moderate pain or severe pain. Patient not taking: Reported on 08/23/2016 05/14/16   Trixie Dredge, PA-C    Family History Family History  Problem Relation Age of Onset  . Hypertension Mother   . Stroke Sister   . Seizures Sister     Social History Social History  Substance Use Topics  . Smoking status: Former Smoker    Packs/day: 0.75    Types: Cigarettes    Quit date: 11/17/1988  . Smokeless tobacco: Never Used  . Alcohol use No     Allergies   Mobic [meloxicam] and Shrimp [shellfish allergy]   Review of Systems Review of Systems  Gastrointestinal: Positive for diarrhea, nausea and vomiting.  All other systems reviewed and are negative.    Physical Exam Updated Vital Signs BP 137/87 (BP Location: Left Arm)   Pulse 73   Temp 98.6 F (37 C) (Oral)   Resp 16   LMP 07/23/2016   SpO2 98%   Physical Exam  Constitutional: She is oriented to person, place, and time. She appears well-developed and well-nourished.  HENT:  Head: Normocephalic and atraumatic.  Right Ear: External ear normal.  Left Ear: External ear normal.  Nose: Nose normal.  Mouth/Throat: Mucous membranes are dry.  Eyes: Conjunctivae and EOM are normal. Pupils are equal, round, and reactive to light.  Neck: Normal range of motion. Neck supple.  Cardiovascular: Normal rate, regular rhythm, normal heart sounds and intact distal pulses.   Pulmonary/Chest: Effort normal and breath sounds normal.  Abdominal: Soft. Bowel sounds are normal.  Musculoskeletal: Normal range of motion.  Neurological: She is alert and oriented to person, place, and time.  Skin: Skin is warm.  Psychiatric: She has a normal mood and affect. Her behavior is normal. Judgment  and thought content normal.  Nursing note and vitals reviewed.    ED Treatments / Results  Labs (all labs ordered are listed, but only abnormal results are displayed) Labs Reviewed  COMPREHENSIVE METABOLIC PANEL - Abnormal; Notable for the following:       Result Value   Glucose, Bld 122 (*)    Calcium 8.7 (*)    Total Protein 8.3 (*)    ALT 58 (*)    Total Bilirubin 2.0 (*)    All other components within normal limits  URINALYSIS, ROUTINE W REFLEX MICROSCOPIC - Abnormal; Notable for the following:    APPearance CLOUDY (*)    Protein, ur 30 (*)  Leukocytes, UA TRACE (*)    Bacteria, UA RARE (*)    Squamous Epithelial / LPF 0-5 (*)    All other components within normal limits  LIPASE, BLOOD  CBC  I-STAT BETA HCG BLOOD, ED ([facility], [facility], [facility] ONLY)    EKG  EKG Interpretation None       Radiology No results found.  Procedures Procedures (including critical care time)  Medications Ordered in ED Medications  sodium chloride 0.9 % bolus 1,000 mL (1,000 mLs Intravenous New Bag/Given 08/23/16 0824)  morphine 4 MG/ML injection 4 mg (4 mg Intravenous Given 08/23/16 0825)  ondansetron (ZOFRAN) injection 4 mg (4 mg Intravenous Given 08/23/16 0825)  promethazine (PHENERGAN) injection 25 mg (25 mg Intravenous Given 08/23/16 1014)  morphine 4 MG/ML injection 4 mg (4 mg Intravenous Given 08/23/16 1015)  famotidine (PEPCID) IVPB 20 mg premix (20 mg Intravenous New Bag/Given 08/23/16 1014)     Initial Impression / Assessment and Plan / ED Course  I have reviewed the triage vital signs and the nursing notes.  Pertinent labs & imaging results that were available during my care of the patient were reviewed by me and considered in my medical decision making (see chart for details).    Zofran did not help pt much.  She was given phenergan which made her feel much better.  She is able to tolerate po fluids.  She knows to return if worse and to f/u with her pcp.  Final Clinical  Impressions(s) / ED Diagnoses   Final diagnoses:  Nausea vomiting and diarrhea    New Prescriptions New Prescriptions   PROMETHAZINE (PHENERGAN) 25 MG TABLET    Take 1 tablet (25 mg total) by mouth every 6 (six) hours as needed for nausea or vomiting.     Jacalyn Lefevre, MD 08/23/16 [phone]

## 2016-08-23 NOTE — ED Triage Notes (Signed)
Pt reports onset of N/V/D onset last PM with ABD pain

## 2016-09-06 ENCOUNTER — Inpatient Hospital Stay (HOSPITAL_COMMUNITY)
Admission: EM | Admit: 2016-09-06 | Discharge: 2016-09-10 | DRG: 194 | Disposition: A | Discharge status: Home / Self Care | Payer: BLUE CROSS/BLUE SHIELD | Attending: Internal Medicine | Admitting: Internal Medicine

## 2016-09-06 ENCOUNTER — Encounter (HOSPITAL_COMMUNITY): Payer: Self-pay | Admitting: Emergency Medicine

## 2016-09-06 ENCOUNTER — Emergency Department (HOSPITAL_COMMUNITY): Payer: BLUE CROSS/BLUE SHIELD

## 2016-09-06 DIAGNOSIS — Z823 Family history of stroke: Secondary | ICD-10-CM

## 2016-09-06 DIAGNOSIS — Z9851 Tubal ligation status: Secondary | ICD-10-CM

## 2016-09-06 DIAGNOSIS — M1990 Unspecified osteoarthritis, unspecified site: Secondary | ICD-10-CM | POA: Diagnosis not present

## 2016-09-06 DIAGNOSIS — Z9049 Acquired absence of other specified parts of digestive tract: Secondary | ICD-10-CM

## 2016-09-06 DIAGNOSIS — J4532 Mild persistent asthma with status asthmaticus: Secondary | ICD-10-CM | POA: Diagnosis present

## 2016-09-06 DIAGNOSIS — Z79899 Other long term (current) drug therapy: Secondary | ICD-10-CM

## 2016-09-06 DIAGNOSIS — J4521 Mild intermittent asthma with (acute) exacerbation: Secondary | ICD-10-CM | POA: Diagnosis not present

## 2016-09-06 DIAGNOSIS — J4520 Mild intermittent asthma, uncomplicated: Secondary | ICD-10-CM | POA: Diagnosis present

## 2016-09-06 DIAGNOSIS — Z8249 Family history of ischemic heart disease and other diseases of the circulatory system: Secondary | ICD-10-CM

## 2016-09-06 DIAGNOSIS — J302 Other seasonal allergic rhinitis: Secondary | ICD-10-CM | POA: Diagnosis present

## 2016-09-06 DIAGNOSIS — J09X2 Influenza due to identified novel influenza A virus with other respiratory manifestations: Principal | ICD-10-CM | POA: Diagnosis present

## 2016-09-06 DIAGNOSIS — Z888 Allergy status to other drugs, medicaments and biological substances status: Secondary | ICD-10-CM

## 2016-09-06 DIAGNOSIS — I10 Essential (primary) hypertension: Secondary | ICD-10-CM

## 2016-09-06 DIAGNOSIS — Z87891 Personal history of nicotine dependence: Secondary | ICD-10-CM

## 2016-09-06 DIAGNOSIS — J101 Influenza due to other identified influenza virus with other respiratory manifestations: Secondary | ICD-10-CM | POA: Diagnosis present

## 2016-09-06 LAB — CBC WITH DIFFERENTIAL/PLATELET
Basophils Absolute: 0 10*3/uL (ref 0.0–0.1)
Basophils Relative: 1 %
Eosinophils Absolute: 0 10*3/uL (ref 0.0–0.7)
Eosinophils Relative: 0 %
HCT: 36.9 % (ref 36.0–46.0)
Hemoglobin: 12.3 g/dL (ref 12.0–15.0)
Lymphocytes Relative: 29 %
Lymphs Abs: 1.4 10*3/uL (ref 0.7–4.0)
MCH: 29 pg (ref 26.0–34.0)
MCHC: 33.3 g/dL (ref 30.0–36.0)
MCV: 87 fL (ref 78.0–100.0)
Monocytes Absolute: 0.7 10*3/uL (ref 0.1–1.0)
Monocytes Relative: 14 %
Neutro Abs: 2.6 10*3/uL (ref 1.7–7.7)
Neutrophils Relative %: 56 %
Platelets: 205 10*3/uL (ref 150–400)
RBC: 4.24 MIL/uL (ref 3.87–5.11)
RDW: 15.5 % (ref 11.5–15.5)
WBC: 4.7 10*3/uL (ref 4.0–10.5)

## 2016-09-06 LAB — BASIC METABOLIC PANEL
Anion gap: 8 (ref 5–15)
BUN: 5 mg/dL — ABNORMAL LOW (ref 6–20)
CO2: 24 mmol/L (ref 22–32)
Calcium: 8.8 mg/dL — ABNORMAL LOW (ref 8.9–10.3)
Chloride: 105 mmol/L (ref 101–111)
Creatinine, Ser: 0.69 mg/dL (ref 0.44–1.00)
GFR calc Af Amer: >60 mL/min (ref >60)
GFR calc non Af Amer: >60 mL/min (ref >60)
Glucose, Bld: 92 mg/dL (ref 65–99)
Potassium: 3.4 mmol/L — ABNORMAL LOW (ref 3.5–5.1)
Sodium: 137 mmol/L (ref 135–145)

## 2016-09-06 LAB — I-STAT CG4 LACTIC ACID, ED: Lactic Acid, Venous: 1.54 mmol/L (ref 0.5–1.9)

## 2016-09-06 LAB — INFLUENZA PANEL BY PCR (TYPE A & B)
Influenza A By PCR: POSITIVE — AB
Influenza B By PCR: NEGATIVE

## 2016-09-06 MED ORDER — HYDROCOD POLST-CPM POLST ER 10-8 MG/5ML PO SUER
5.0000 mL | Freq: Once | ORAL | Status: AC
  Administered 2016-09-06: 5 mL via ORAL
  Filled 2016-09-06: qty 5

## 2016-09-06 MED ORDER — ALBUTEROL SULFATE (2.5 MG/3ML) 0.083% IN NEBU: 2.5000 mg | INHALATION_SOLUTION | Freq: Four times a day (QID) | RESPIRATORY_TRACT | Status: DC | PRN

## 2016-09-06 MED ORDER — FLUTICASONE FUROATE-VILANTEROL 100-25 MCG/INH IN AEPB
1.0000 | INHALATION_SPRAY | Freq: Every day | RESPIRATORY_TRACT | Status: DC
  Administered 2016-09-07 – 2016-09-10 (×4): 1 via RESPIRATORY_TRACT
  Filled 2016-09-06: qty 28

## 2016-09-06 MED ORDER — POTASSIUM CHLORIDE CRYS ER 20 MEQ PO TBCR
40.0000 meq | EXTENDED_RELEASE_TABLET | Freq: Once | ORAL | Status: AC
  Administered 2016-09-06: 40 meq via ORAL
  Filled 2016-09-06: qty 2

## 2016-09-06 MED ORDER — ALBUTEROL SULFATE (2.5 MG/3ML) 0.083% IN NEBU: 3.0000 mL | INHALATION_SOLUTION | Freq: Four times a day (QID) | RESPIRATORY_TRACT | Status: DC | PRN

## 2016-09-06 MED ORDER — OSELTAMIVIR PHOSPHATE 75 MG PO CAPS
75.0000 mg | ORAL_CAPSULE | Freq: Two times a day (BID) | ORAL | Status: DC
  Administered 2016-09-07 – 2016-09-10 (×7): 75 mg via ORAL
  Filled 2016-09-06 (×7): qty 1

## 2016-09-06 MED ORDER — ENOXAPARIN SODIUM 40 MG/0.4ML ~~LOC~~ SOLN
40.0000 mg | Freq: Every day | SUBCUTANEOUS | Status: DC
  Administered 2016-09-06 – 2016-09-09 (×4): 40 mg via SUBCUTANEOUS
  Filled 2016-09-06 (×4): qty 0.4

## 2016-09-06 MED ORDER — MAGNESIUM SULFATE 2 GM/50ML IV SOLN
2.0000 g | Freq: Once | INTRAVENOUS | Status: AC
  Administered 2016-09-06: 2 g via INTRAVENOUS
  Filled 2016-09-06: qty 50

## 2016-09-06 MED ORDER — OSELTAMIVIR PHOSPHATE 75 MG PO CAPS
75.0000 mg | ORAL_CAPSULE | Freq: Once | ORAL | Status: AC
  Administered 2016-09-07: 75 mg via ORAL
  Filled 2016-09-06: qty 1

## 2016-09-06 MED ORDER — IPRATROPIUM-ALBUTEROL 0.5-2.5 (3) MG/3ML IN SOLN
3.0000 mL | Freq: Once | RESPIRATORY_TRACT | Status: AC
  Administered 2016-09-06: 3 mL via RESPIRATORY_TRACT
  Filled 2016-09-06: qty 3

## 2016-09-06 MED ORDER — ALBUTEROL SULFATE (2.5 MG/3ML) 0.083% IN NEBU
2.5000 mg | INHALATION_SOLUTION | Freq: Four times a day (QID) | RESPIRATORY_TRACT | Status: DC
  Administered 2016-09-07 – 2016-09-08 (×5): 2.5 mg via RESPIRATORY_TRACT
  Filled 2016-09-06 (×5): qty 3

## 2016-09-06 MED ORDER — BENZONATATE 100 MG PO CAPS
200.0000 mg | ORAL_CAPSULE | Freq: Three times a day (TID) | ORAL | Status: DC | PRN
  Administered 2016-09-06 – 2016-09-07 (×2): 200 mg via ORAL
  Filled 2016-09-06 (×2): qty 2

## 2016-09-06 MED ORDER — ALBUTEROL (5 MG/ML) CONTINUOUS INHALATION SOLN
5.0000 mg/h | INHALATION_SOLUTION | Freq: Once | RESPIRATORY_TRACT | Status: AC
  Administered 2016-09-06: 5 mg/h via RESPIRATORY_TRACT
  Filled 2016-09-06 (×2): qty 20

## 2016-09-06 MED ORDER — ACETAMINOPHEN 325 MG PO TABS
650.0000 mg | ORAL_TABLET | Freq: Once | ORAL | Status: AC
  Administered 2016-09-06: 650 mg via ORAL
  Filled 2016-09-06: qty 2

## 2016-09-06 MED ORDER — PROMETHAZINE HCL 25 MG PO TABS: 25.0000 mg | ORAL_TABLET | Freq: Four times a day (QID) | ORAL | Status: DC | PRN

## 2016-09-06 MED ORDER — MAGNESIUM SULFATE 50 % IJ SOLN: 2.0000 g | Freq: Once | INTRAMUSCULAR | Status: DC

## 2016-09-06 MED ORDER — ACETAMINOPHEN 325 MG PO TABS
650.0000 mg | ORAL_TABLET | ORAL | Status: DC | PRN
  Administered 2016-09-06: 650 mg via ORAL
  Filled 2016-09-06: qty 2

## 2016-09-06 MED ORDER — ALBUTEROL SULFATE (2.5 MG/3ML) 0.083% IN NEBU: 2.5000 mg | INHALATION_SOLUTION | RESPIRATORY_TRACT | Status: DC | PRN

## 2016-09-06 MED ORDER — ALBUTEROL SULFATE (2.5 MG/3ML) 0.083% IN NEBU
5.0000 mg | INHALATION_SOLUTION | Freq: Once | RESPIRATORY_TRACT | Status: AC
  Administered 2016-09-06: 5 mg via RESPIRATORY_TRACT
  Filled 2016-09-06: qty 6

## 2016-09-06 MED ORDER — MONTELUKAST SODIUM 10 MG PO TABS
10.0000 mg | ORAL_TABLET | Freq: Every day | ORAL | Status: DC
  Administered 2016-09-06 – 2016-09-09 (×4): 10 mg via ORAL
  Filled 2016-09-06 (×4): qty 1

## 2016-09-06 NOTE — ED Provider Notes (Addendum)
8:10 PM Patient with shortness of breath cough and wheeze for the past 2 days, accompanied by subjective fever. On exam she is alert speaks in sentences lungs with prolonged expiratory phase and expiratory wheezes. Coughing frequently Chest x-ray viewed by me. Patient felt to have influenza-like illness . No need for intravenous antibiotics   Doug SouSam Taft Worthing, MD 09/06/16 2017    Doug SouSam Bethaney Oshana, MD 09/07/16 [id number]

## 2016-09-06 NOTE — ED Provider Notes (Signed)
[facility]Y DEPT Provider Note   CSN: [id number] Arrival date & time: 09/06/16  1455     History   Chief Complaint Chief Complaint  Patient presents with  . Asthma    HPI Wendy James is a 46 y.o. female with history of asthma who presents with a one-day history of shortness of breath, wheezing, productive cough. Patient also has severe chills in the room. Patient reports she has been taking her albuterol inhaler nebulizer at home without relief. Patient states she had a fever of 101 prior to arrival. She denies any chest pain, but does state burning in her lungs. She states that it feels similar to her past asthma exacerbations, but worse. Patient denies any abdominal pain, nausea, vomiting, or new symptoms.  HPI  Past Medical History:  Diagnosis Date  . 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 Asthma   . Seasonal allergies     Patient Active Problem List   Diagnosis Date Noted  . Influenza A with respiratory manifestations 09/06/2016  . Prediabetes 05/08/2015  . Essential hypertension 05/08/2015  . Mild intermittent asthma 05/06/2015  . Leukocytosis, steroid induced  04/29/2015  . Steroid-induced hyperglycemia 04/29/2015  . Asthma exacerbation 04/29/2015  . Accelerated hypertension 04/28/2015  . Anemia of chroic diseae, IDA 11/17/2013  . Bronchitis, acute 04/12/2012  . Acute asthma exacerbation 03/12/2012  . Acute respiratory failure (HCC) 03/12/2012    Past Surgical History:  Procedure Laterality Date  . CESAREAN SECTION     x 4  . CHOLECYSTECTOMY    . HYSTEROSCOPY W/D&C N/A 02/18/2015   Procedure: DILATATION AND CURETTAGE /HYSTEROSCOPY;  Surgeon: Allie Bossier, MD;  Location: [facility];  Service: Gynecology;  Laterality: N/A;  . TUBAL LIGATION      OB History    Gravida Para Term Preterm AB Living   5 4 4  0 1 3   SAB TAB Ectopic Multiple Live Births   1 0 0 0         Home Medications    Prior to Admission medications   Medication Sig Start Date End Date  Taking? Authorizing Provider  albuterol (PROVENTIL) (2.5 MG/3ML) 0.083% nebulizer solution Take 3 mLs (2.5 mg total) by nebulization every 6 (six) hours as needed for wheezing or shortness of breath. Reported on 08/17/2015 11/02/15  Yes Raeford Razor, MD  promethazine (PHENERGAN) 25 MG tablet Take 1 tablet (25 mg total) by mouth every 6 (six) hours as needed for nausea or vomiting. 08/23/16  Yes Jacalyn Lefevre, MD  VENTOLIN HFA 108 (90 Base) MCG/ACT inhaler INHALE 2 PUFFS INTO THE LUNGS EVERY 6 HOURS AS NEEDED FOR WHEEZING OR SHORTNESS OF BREATH 02/23/16  Yes Henrietta Hoover, NP  amLODipine (NORVASC) 5 MG tablet Take 1 tablet (5 mg total) by mouth daily. Patient not taking: Reported on 08/23/2016 11/07/15   Massie Maroon, FNP  budesonide-formoterol (SYMBICORT) 80-4.5 MCG/ACT inhaler Inhale 2 puffs into the lungs 2 (two) times daily. Patient not taking: Reported on 08/23/2016 11/14/15   Quentin Angst, MD  montelukast (SINGULAIR) 10 MG tablet Take 1 tablet (10 mg total) by mouth at bedtime. Patient not taking: Reported on 08/23/2016 11/07/15   Massie Maroon, FNP    Family History Family History  Problem Relation Age of Onset  . Hypertension Mother   . Stroke Sister   . Seizures Sister     Social History Social History  Substance Use Topics  . Smoking status: Former Smoker    Packs/day:  0.75    Types: Cigarettes    Quit date: 11/17/1988  . Smokeless tobacco: Never Used  . Alcohol use No     Allergies   Mobic [meloxicam] and Shrimp [shellfish allergy]   Review of Systems Review of Systems  Constitutional: Positive for chills and fever.  HENT: Negative for facial swelling and sore throat.   Respiratory: Positive for cough, shortness of breath and wheezing.   Cardiovascular: Negative for chest pain.  Gastrointestinal: Negative for abdominal pain, nausea and vomiting.  Genitourinary: Negative for dysuria.  Musculoskeletal: Negative for back pain.  Skin: Negative for rash and  wound.  Neurological: Negative for headaches.  Psychiatric/Behavioral: The patient is not nervous/anxious.      Physical Exam Updated Vital Signs BP 137/83 (BP Location: Left Arm)   Pulse 77   Temp 98.3 F (36.8 C) (Oral)   Resp 18   Ht 5\' 1"  (1.549 m)   Wt 91.7 kg   LMP 08/21/2016   SpO2 95%   BMI 38.19 kg/m   Physical Exam  Constitutional: She appears well-developed and well-nourished. No distress.  HENT:  Head: Normocephalic and atraumatic.  Mouth/Throat: Oropharynx is clear and moist. No oropharyngeal exudate.  Eyes: Conjunctivae are normal. Pupils are equal, round, and reactive to light. Right eye exhibits no discharge. Left eye exhibits no discharge. No scleral icterus.  Neck: Normal range of motion. Neck supple. No thyromegaly present.  Cardiovascular: Normal rate, regular rhythm, normal heart sounds and intact distal pulses.  Exam reveals no gallop and no friction rub.   No murmur heard. Pulmonary/Chest: No stridor. No respiratory distress. She has wheezes (expiratory bilateral). She has no rales.  Abdominal: Soft. Bowel sounds are normal. She exhibits no distension. There is no tenderness. There is no rebound and no guarding.  Musculoskeletal: She exhibits no edema.  Lymphadenopathy:    She has no cervical adenopathy.  Neurological: She is alert. Coordination normal.  Skin: Skin is warm and dry. No rash noted. She is not diaphoretic. No pallor.  Psychiatric: She has a normal mood and affect.  Nursing note and vitals reviewed.    ED Treatments / Results  Labs (all labs ordered are listed, but only abnormal results are displayed) Labs Reviewed  BASIC METABOLIC PANEL - Abnormal; Notable for the following:       Result Value   Potassium 3.4 (*)    BUN 5 (*)    Calcium 8.8 (*)    All other components within normal limits  INFLUENZA PANEL BY PCR (TYPE A & B) - Abnormal; Notable for the following:    Influenza A By PCR POSITIVE (*)    All other components within  normal limits  CBC - Abnormal; Notable for the following:    Hemoglobin 11.4 (*)    HCT 34.6 (*)    RDW 15.6 (*)    All other components within normal limits  BASIC METABOLIC PANEL - Abnormal; Notable for the following:    Glucose, Bld 117 (*)    Calcium 8.3 (*)    All other components within normal limits  CULTURE, BLOOD (ROUTINE X 2)  CULTURE, BLOOD (ROUTINE X 2)  CBC WITH DIFFERENTIAL/PLATELET  I-STAT CG4 LACTIC ACID, ED    EKG  EKG Interpretation None       Radiology No results found.  Procedures Procedures (including critical care time)  Medications Ordered in ED Medications  albuterol (PROVENTIL) (2.5 MG/3ML) 0.083% nebulizer solution 2.5 mg (not administered)  promethazine (PHENERGAN) tablet 25 mg (not administered)  albuterol (PROVENTIL) (2.5 MG/3ML) 0.083% nebulizer solution 3 mL (not administered)  montelukast (SINGULAIR) tablet 10 mg (10 mg Oral Given 09/07/16 2156)  fluticasone furoate-vilanterol (BREO ELLIPTA) 100-25 MCG/INH 1 puff (1 puff Inhalation Given 09/08/16 1126)  enoxaparin (LOVENOX) injection 40 mg (40 mg Subcutaneous Given 09/07/16 2155)  oseltamivir (TAMIFLU) capsule 75 mg (75 mg Oral Given 09/08/16 1124)  acetaminophen (TYLENOL) tablet 650 mg (650 mg Oral Given 09/06/16 2353)  traMADol (ULTRAM) tablet 50 mg (50 mg Oral Given 09/07/16 2155)  chlorpheniramine-HYDROcodone (TUSSIONEX) 10-8 MG/5ML suspension 5 mL (5 mLs Oral Given 09/08/16 1118)  ibuprofen (ADVIL,MOTRIN) tablet 400 mg (400 mg Oral Given 09/08/16 1705)  benzonatate (TESSALON) capsule 200 mg (200 mg Oral Given 09/08/16 1704)  0.9 %  sodium chloride infusion ( Intravenous New Bag/Given 09/08/16 1706)  albuterol (PROVENTIL) (2.5 MG/3ML) 0.083% nebulizer solution 2.5 mg (2.5 mg Nebulization Given 09/08/16 1332)  methylPREDNISolone sodium succinate (SOLU-MEDROL) 40 mg/mL injection 40 mg (40 mg Intravenous Given 09/08/16 2020)  famotidine (PEPCID) tablet 20 mg (20 mg Oral Given 09/08/16 1421)  zolpidem  (AMBIEN) tablet 5 mg (not administered)  albuterol (PROVENTIL) (2.5 MG/3ML) 0.083% nebulizer solution 5 mg (5 mg Nebulization Given 09/06/16 1506)  ipratropium-albuterol (DUONEB) 0.5-2.5 (3) MG/3ML nebulizer solution 3 mL (3 mLs Nebulization Given 09/06/16 1626)  ipratropium-albuterol (DUONEB) 0.5-2.5 (3) MG/3ML nebulizer solution 3 mL (3 mLs Nebulization Given 09/06/16 1828)  acetaminophen (TYLENOL) tablet 650 mg (650 mg Oral Given 09/06/16 1902)  albuterol (PROVENTIL,VENTOLIN) solution continuous neb (5 mg/hr Nebulization Given 09/06/16 1946)  chlorpheniramine-HYDROcodone (TUSSIONEX) 10-8 MG/5ML suspension 5 mL (5 mLs Oral Given 09/06/16 2031)  potassium chloride SA (K-DUR,KLOR-CON) CR tablet 40 mEq (40 mEq Oral Given 09/06/16 2049)  magnesium sulfate IVPB 2 g 50 mL (0 g Intravenous Stopped 09/06/16 2152)  oseltamivir (TAMIFLU) capsule 75 mg (75 mg Oral Given 09/07/16 0027)     Initial Impression / Assessment and Plan / ED Course  I have reviewed the triage vital signs and the nursing notes.  Pertinent labs & imaging results that were available during my care of the patient were reviewed by me and considered in my medical decision making (see chart for details).     Patient given albuterol and DuoNeb prior to my exam with no improvement. Patient with significant expiratory wheezing on my lung exam. We'll repeat albuterol DuoNeb.  On reexam after second neb, patient continues to have shortness of breath and wheezing. We will try one hour continuous neb.  After continuous neb, patient continues to be significant short of breath. Lung exam not improved. We'll try mag sulfate.  After mag sulfate, patient continues symptoms without improvement of lung exam.  CBC unremarkable. CMP shows potassium 3.4, BUN 5, calcium 8.8. Lactate 1.54. CXR shows no active cardiopulmonary disease. Blood cultures pending. Patient initially declined flu swab because she states that it is just her asthma. On consultation with Dr.  Julian Reil with [facility] and his evaluation of the patient, patient has agreed. Dr. Lanae Boast will admit patient for status asthmaticus and further valuation and treatment of the patient's symptoms. Patient also evaluated by Dr. Ethelda Chick to that of the patient's management and agrees with plan.  Final Clinical Impressions(s) / ED Diagnoses   Final diagnoses:  Asthma in adult, mild persistent, with status asthmaticus    New Prescriptions Current Discharge Medication List       Verdis Prime 09/08/16 2033    Doug Sou, MD 09/10/16 1215

## 2016-09-06 NOTE — Progress Notes (Signed)
Influenza A positive.

## 2016-09-06 NOTE — H&P (Signed)
History and Physical    Ralene MuskratLynette M James ZOX:[id number] DOB: 06/04/1971 DOA: 09/06/2016   PCP: Massie MaroonHollis,Wendy M, FNP Chief Complaint:  Chief Complaint  Patient presents with  . Asthma    HPI: Jeani HawkingLynette M Kasa is a 46 y.o. female with medical history significant of Asthma.  Patient appears to have ran out of her preventative inhalers for asthma.  She presents to the ED with one day history of SOB, wheezing, productive cough, subjective fevers (Tm 102 in ED).  Symptoms are severe.  Not helped by albuterol inhaler at home.  No N/V/D.  ED Course: Tm 102.0!  Initially declined flu swab but agreed to this after my discussion with patient: Influenza PCR pending.  Steroids not given due to concern for possible influenza.  Patient got breathing treatments and magnesium without much improvement.  Review of Systems: As per HPI otherwise 10 point review of systems negative.    Past Medical History:  Diagnosis Date  . 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 Asthma   . Seasonal allergies     Past Surgical History:  Procedure Laterality Date  . CESAREAN SECTION     x 4  . CHOLECYSTECTOMY    . HYSTEROSCOPY W/D&C N/A 02/18/2015   Procedure: DILATATION AND CURETTAGE /HYSTEROSCOPY;  Surgeon: Allie BossierMyra C Dove, MD;  Location: [facility];  Service: Gynecology;  Laterality: N/A;  . TUBAL LIGATION       reports that she quit smoking about 27 years ago. Her smoking use included Cigarettes. She smoked 0.75 packs per day. She has never used smokeless tobacco. She reports that she does not drink alcohol or use drugs.  Allergies  Allergen Reactions  . Mobic [Meloxicam] Hives    Pt reports she gets real bad hives from Mobic  . Shrimp [Shellfish Allergy] Other (See Comments)    Wheezing.  Patient states she is ok with betadine    Family History  Problem Relation Age of Onset  . Hypertension Mother   . Stroke Sister   . Seizures Sister       Prior to Admission medications   Medication Sig Start Date End Date  Taking? Authorizing Provider  albuterol (PROVENTIL) (2.5 MG/3ML) 0.083% nebulizer solution Take 3 mLs (2.5 mg total) by nebulization every 6 (six) hours as needed for wheezing or shortness of breath. Reported on 08/17/2015 11/02/15  Yes Raeford RazorStephen Kohut, MD  promethazine (PHENERGAN) 25 MG tablet Take 1 tablet (25 mg total) by mouth every 6 (six) hours as needed for nausea or vomiting. 08/23/16  Yes Jacalyn LefevreJulie Haviland, MD  VENTOLIN HFA 108 (90 Base) MCG/ACT inhaler INHALE 2 PUFFS INTO THE LUNGS EVERY 6 HOURS AS NEEDED FOR WHEEZING OR SHORTNESS OF BREATH 02/23/16  Yes Henrietta HooverLinda C Bernhardt, NP  amLODipine (NORVASC) 5 MG tablet Take 1 tablet (5 mg total) by mouth daily. Patient not taking: Reported on 08/23/2016 11/07/15   Massie MaroonLachina M Hollis, FNP  budesonide-formoterol (SYMBICORT) 80-4.5 MCG/ACT inhaler Inhale 2 puffs into the lungs 2 (two) times daily. Patient not taking: Reported on 08/23/2016 11/14/15   Quentin Angstlugbemiga E Jegede, MD  montelukast (SINGULAIR) 10 MG tablet Take 1 tablet (10 mg total) by mouth at bedtime. Patient not taking: Reported on 08/23/2016 11/07/15   Massie MaroonLachina M Hollis, FNP    Physical Exam: Vitals:   09/06/16 1626 09/06/16 1843 09/06/16 1909 09/06/16 2205  BP:   130/97 114/88  Pulse:   91 101  Resp:   (!) 28 16  Temp:  102 F (38.9 C)  101 F (38.3 C) 98.8 F (37.1 C)  TempSrc:  Rectal Oral Oral  SpO2: 98%  99% 100%      Constitutional: NAD, calm, comfortable Eyes: PERRL, lids and conjunctivae normal ENMT: Mucous membranes are moist. Posterior pharynx clear of any exudate or lesions.Normal dentition.  Neck: normal, supple, no masses, no thyromegaly Respiratory: Wet sounding cough, diffuse wheezes. Cardiovascular: Regular rate and rhythm, no murmurs / rubs / gallops. No extremity edema. 2+ pedal pulses. No carotid bruits.  Abdomen: no tenderness, no masses palpated. No hepatosplenomegaly. Bowel sounds positive.  Musculoskeletal: no clubbing / cyanosis. No joint deformity upper and lower  extremities. Good ROM, no contractures. Normal muscle tone.  Skin: no rashes, lesions, ulcers. No induration Neurologic: CN 2-12 grossly intact. Sensation intact, DTR normal. Strength 5/5 in all 4.  Psychiatric: Normal judgment and insight. Alert and oriented x 3. Normal mood.    Labs on Admission: I have personally reviewed following labs and imaging studies  CBC:  Recent Labs Lab 09/06/16 1833  WBC 4.7  NEUTROABS 2.6  HGB 12.3  HCT 36.9  MCV 87.0  PLT 205   Basic Metabolic Panel:  Recent Labs Lab 09/06/16 1833  NA 137  K 3.4*  CL 105  CO2 24  GLUCOSE 92  BUN 5*  CREATININE 0.69  CALCIUM 8.8*   GFR: CrCl cannot be calculated (Unknown ideal weight.). Liver Function Tests: No results for input(s): AST, ALT, ALKPHOS, BILITOT, PROT, ALBUMIN in the last 168 hours. No results for input(s): LIPASE, AMYLASE in the last 168 hours. No results for input(s): AMMONIA in the last 168 hours. Coagulation Profile: No results for input(s): INR, PROTIME in the last 168 hours. Cardiac Enzymes: No results for input(s): CKTOTAL, CKMB, CKMBINDEX, TROPONINI in the last 168 hours. BNP (last 3 results) No results for input(s): PROBNP in the last 8760 hours. HbA1C: No results for input(s): HGBA1C in the last 72 hours. CBG: No results for input(s): GLUCAP in the last 168 hours. Lipid Profile: No results for input(s): CHOL, HDL, LDLCALC, TRIG, CHOLHDL, LDLDIRECT in the last 72 hours. Thyroid Function Tests: No results for input(s): TSH, T4TOTAL, FREET4, T3FREE, THYROIDAB in the last 72 hours. Anemia Panel: No results for input(s): VITAMINB12, FOLATE, FERRITIN, TIBC, IRON, RETICCTPCT in the last 72 hours. Urine analysis:    Component Value Date/Time   COLORURINE YELLOW 08/23/2016 0758   APPEARANCEUR CLOUDY (A) 08/23/2016 0758   LABSPEC 1.024 08/23/2016 0758   PHURINE 5.0 08/23/2016 0758   GLUCOSEU NEGATIVE 08/23/2016 0758   HGBUR NEGATIVE 08/23/2016 0758   BILIRUBINUR NEGATIVE  08/23/2016 0758   KETONESUR NEGATIVE 08/23/2016 0758   PROTEINUR 30 (A) 08/23/2016 0758   UROBILINOGEN 0.2 11/07/2015 1524   NITRITE NEGATIVE 08/23/2016 0758   LEUKOCYTESUR TRACE (A) 08/23/2016 0758   Sepsis Labs: @LABRCNTIP (procalcitonin:4,lacticidven:4) )No results found for this or any previous visit (from the past 240 hour(s)).   Radiological Exams on Admission: Dg Chest 2 View  Result Date: 09/06/2016 CLINICAL DATA:  Hypertension, shortness of breath, smoking history, chest pain EXAM: CHEST  2 VIEW COMPARISON:  Chest x-ray of 11/22/2015 FINDINGS: No active infiltrate or effusion is noted. Mediastinal and hilar contours are unremarkable. The heart is within normal limits in size. No bony abnormality is seen. IMPRESSION: No active cardiopulmonary disease. Electronically Signed   By: Dwyane Dee M.D.   On: 09/06/2016 15:24    EKG: Independently reviewed.  Assessment/Plan Principal Problem:   Asthma exacerbation Active Problems:   Influenza-like illness    1.  Asthma exacerbation - with ILI 1. Adult wheeze protocol 2. Resume home preventative inhalers 3. Albuterol PRN 4. Stat flu swab sent 1. If positive then continue tamiflu, giving first dose now in ED 2. If negative then start patient on systemic corticosteroids 5. Tele monitor and continuous pulse ox   DVT prophylaxis: Lovenox Code Status: Full Family Communication: No family in room Consults called: None Admission status: Admit to obs   Lakayla Barrington, Heywood Iles DO [facility] Pager [phone] from 7PM-7AM  If 7AM-7PM, please contact the day physician for the patient [email or web address] Password [email or web address]  09/06/2016, 10:41 PM

## 2016-09-06 NOTE — ED Triage Notes (Signed)
Pt from home with complaints of asthma attack that began last night. Pt has used her neb and her inhaler with no relief. Pt has diminished sounds and wheezes in all fields

## 2016-09-06 NOTE — ED Notes (Signed)
Pt refused flu swab.

## 2016-09-07 DIAGNOSIS — J101 Influenza due to other identified influenza virus with other respiratory manifestations: Secondary | ICD-10-CM | POA: Diagnosis not present

## 2016-09-07 DIAGNOSIS — J4520 Mild intermittent asthma, uncomplicated: Secondary | ICD-10-CM | POA: Diagnosis not present

## 2016-09-07 DIAGNOSIS — J4521 Mild intermittent asthma with (acute) exacerbation: Secondary | ICD-10-CM | POA: Diagnosis not present

## 2016-09-07 LAB — BASIC METABOLIC PANEL
Anion gap: 7 (ref 5–15)
BUN: 7 mg/dL (ref 6–20)
CO2: 24 mmol/L (ref 22–32)
Calcium: 8.3 mg/dL — ABNORMAL LOW (ref 8.9–10.3)
Chloride: 105 mmol/L (ref 101–111)
Creatinine, Ser: 0.68 mg/dL (ref 0.44–1.00)
GFR calc Af Amer: >60 mL/min (ref >60)
GFR calc non Af Amer: >60 mL/min (ref >60)
Glucose, Bld: 117 mg/dL — ABNORMAL HIGH (ref 65–99)
Potassium: 3.8 mmol/L (ref 3.5–5.1)
Sodium: 136 mmol/L (ref 135–145)

## 2016-09-07 LAB — CBC
HCT: 34.6 % — ABNORMAL LOW (ref 36.0–46.0)
Hemoglobin: 11.4 g/dL — ABNORMAL LOW (ref 12.0–15.0)
MCH: 29 pg (ref 26.0–34.0)
MCHC: 32.9 g/dL (ref 30.0–36.0)
MCV: 88 fL (ref 78.0–100.0)
Platelets: 187 10*3/uL (ref 150–400)
RBC: 3.93 MIL/uL (ref 3.87–5.11)
RDW: 15.6 % — ABNORMAL HIGH (ref 11.5–15.5)
WBC: 4.2 10*3/uL (ref 4.0–10.5)

## 2016-09-07 MED ORDER — BENZONATATE 100 MG PO CAPS
200.0000 mg | ORAL_CAPSULE | Freq: Three times a day (TID) | ORAL | Status: DC
  Administered 2016-09-07 – 2016-09-10 (×9): 200 mg via ORAL
  Filled 2016-09-07 (×9): qty 2

## 2016-09-07 MED ORDER — SODIUM CHLORIDE 0.9 % IV SOLN
INTRAVENOUS | Status: DC
  Administered 2016-09-07 – 2016-09-08 (×3): via INTRAVENOUS

## 2016-09-07 MED ORDER — HYDROCOD POLST-CPM POLST ER 10-8 MG/5ML PO SUER
5.0000 mL | Freq: Two times a day (BID) | ORAL | Status: DC
  Administered 2016-09-07 – 2016-09-10 (×7): 5 mL via ORAL
  Filled 2016-09-07 (×7): qty 5

## 2016-09-07 MED ORDER — TRAMADOL HCL 50 MG PO TABS
50.0000 mg | ORAL_TABLET | Freq: Four times a day (QID) | ORAL | Status: DC | PRN
  Administered 2016-09-07 – 2016-09-09 (×3): 50 mg via ORAL
  Filled 2016-09-07 (×3): qty 1

## 2016-09-07 MED ORDER — IBUPROFEN 200 MG PO TABS
400.0000 mg | ORAL_TABLET | Freq: Four times a day (QID) | ORAL | Status: DC
  Administered 2016-09-07 – 2016-09-10 (×12): 400 mg via ORAL
  Filled 2016-09-07 (×12): qty 2

## 2016-09-07 NOTE — Progress Notes (Signed)
PROGRESS NOTE    Wendy James   ZOX:[id number]  DOB: 1970-08-15  DOA: 09/06/2016 PCP: Massie Maroon, FNP   Brief Narrative:  Wendy James is a 46 y.o. female with medical history significant of Asthma.  Patient appears to have ran out of her preventative inhalers for asthma.  She presents to the ED with one day history of SOB, wheezing, productive cough, subjective fevers (Tm 102 in ED).  Symptoms are severe.  Not helped by albuterol inhaler at home.  No N/V/D.  Subjective: Cough, wheezing, congestion and pain in b/l chest wall when coughing.   Assessment & Plan: Asthma exacerbation due to Influenza - cont Tamiflu, make Nebs more frequent, add cough suppressants and Motrin to control chest pain    DVT prophylaxis: Lovenox Code Status: Full code Family Communication:  Disposition Plan: home tomorrow if stable Consultants:   none Procedures:   none Antimicrobials:  Anti-infectives    Start     Dose/Rate Route Frequency Ordered Stop   09/07/16 1000  oseltamivir (TAMIFLU) capsule 75 mg     75 mg Oral 2 times daily 09/06/16 2318 09/12/16 0959   09/06/16 2230  oseltamivir (TAMIFLU) capsule 75 mg     75 mg Oral  Once 09/06/16 2229 09/07/16 0027       Objective: Vitals:   09/07/16 0525 09/07/16 0738 09/07/16 1202 09/07/16 1203  BP: 134/90     Pulse: 97     Resp: 20     Temp: 99.3 F (37.4 C)     TempSrc: Oral     SpO2: 98% 98% 98% 98%  Weight:      Height:        Intake/Output Summary (Last 24 hours) at 09/07/16 1258 Last data filed at 09/07/16 0600  Gross per 24 hour  Intake              840 ml  Output                0 ml  Net              840 ml   Filed Weights   09/06/16 2336  Weight: 91.7 kg (202 lb 1.6 oz)    Examination: General exam: Appears comfortable  HEENT: PERRLA, oral mucosa moist, no sclera icterus or thrush Respiratory system: b/l wheezing- Respiratory effort normal. Cardiovascular system: S1 & S2 heard, RRR.  No murmurs    Gastrointestinal system: Abdomen soft, non-tender, nondistended. Normal bowel sound. No organomegaly Central nervous system: Alert and oriented. No focal neurological deficits. Extremities: No cyanosis, clubbing or edema Skin: No rashes or ulcers Psychiatry:  Mood & affect appropriate.     Data Reviewed: I have personally reviewed following labs and imaging studies  CBC:  Recent Labs Lab 09/06/16 1833 09/07/16 0539  WBC 4.7 4.2  NEUTROABS 2.6  --   HGB 12.3 11.4*  HCT 36.9 34.6*  MCV 87.0 88.0  PLT 205 187   Basic Metabolic Panel:  Recent Labs Lab 09/06/16 1833 09/07/16 0539  NA 137 136  K 3.4* 3.8  CL 105 105  CO2 24 24  GLUCOSE 92 117*  BUN 5* 7  CREATININE 0.69 0.68  CALCIUM 8.8* 8.3*   GFR: Estimated Creatinine Clearance: 89.8 mL/min (by C-G formula based on SCr of 0.68 mg/dL). Liver Function Tests: No results for input(s): AST, ALT, ALKPHOS, BILITOT, PROT, ALBUMIN in the last 168 hours. No results for input(s): LIPASE, AMYLASE in the last 168 hours. No results for  input(s): AMMONIA in the last 168 hours. Coagulation Profile: No results for input(s): INR, PROTIME in the last 168 hours. Cardiac Enzymes: No results for input(s): CKTOTAL, CKMB, CKMBINDEX, TROPONINI in the last 168 hours. BNP (last 3 results) No results for input(s): PROBNP in the last 8760 hours. HbA1C: No results for input(s): HGBA1C in the last 72 hours. CBG: No results for input(s): GLUCAP in the last 168 hours. Lipid Profile: No results for input(s): CHOL, HDL, LDLCALC, TRIG, CHOLHDL, LDLDIRECT in the last 72 hours. Thyroid Function Tests: No results for input(s): TSH, T4TOTAL, FREET4, T3FREE, THYROIDAB in the last 72 hours. Anemia Panel: No results for input(s): VITAMINB12, FOLATE, FERRITIN, TIBC, IRON, RETICCTPCT in the last 72 hours. Urine analysis:    Component Value Date/Time   COLORURINE YELLOW 08/23/2016 0758   APPEARANCEUR CLOUDY (A) 08/23/2016 0758   LABSPEC 1.024  08/23/2016 0758   PHURINE 5.0 08/23/2016 0758   GLUCOSEU NEGATIVE 08/23/2016 0758   HGBUR NEGATIVE 08/23/2016 0758   BILIRUBINUR NEGATIVE 08/23/2016 0758   KETONESUR NEGATIVE 08/23/2016 0758   PROTEINUR 30 (A) 08/23/2016 0758   UROBILINOGEN 0.2 11/07/2015 1524   NITRITE NEGATIVE 08/23/2016 0758   LEUKOCYTESUR TRACE (A) 08/23/2016 0758   Sepsis Labs: @LABRCNTIP (procalcitonin:4,lacticidven:4) )No results found for this or any previous visit (from the past 240 hour(s)).       Radiology Studies: Dg Chest 2 View  Result Date: 09/06/2016 CLINICAL DATA:  Hypertension, shortness of breath, smoking history, chest pain EXAM: CHEST  2 VIEW COMPARISON:  Chest x-ray of 11/22/2015 FINDINGS: No active infiltrate or effusion is noted. Mediastinal and hilar contours are unremarkable. The heart is within normal limits in size. No bony abnormality is seen. IMPRESSION: No active cardiopulmonary disease. Electronically Signed   By: Dwyane DeePaul  Barry M.D.   On: 09/06/2016 15:24      Scheduled Meds: . albuterol  2.5 mg Nebulization QID  . benzonatate  200 mg Oral TID  . chlorpheniramine-HYDROcodone  5 mL Oral Q12H  . enoxaparin (LOVENOX) injection  40 mg Subcutaneous QHS  . fluticasone furoate-vilanterol  1 puff Inhalation Daily  . ibuprofen  400 mg Oral QID  . montelukast  10 mg Oral QHS  . oseltamivir  75 mg Oral BID   Continuous Infusions:   LOS: 0 days    Time spent in minutes: 35    Wendy Vasek, MD [facility] Pager: [email or web address] Password [email or web address] 09/07/2016, 12:58 PM

## 2016-09-08 DIAGNOSIS — J302 Other seasonal allergic rhinitis: Secondary | ICD-10-CM | POA: Diagnosis present

## 2016-09-08 DIAGNOSIS — J4520 Mild intermittent asthma, uncomplicated: Secondary | ICD-10-CM | POA: Diagnosis not present

## 2016-09-08 DIAGNOSIS — Z79899 Other long term (current) drug therapy: Secondary | ICD-10-CM | POA: Diagnosis not present

## 2016-09-08 DIAGNOSIS — Z9851 Tubal ligation status: Secondary | ICD-10-CM | POA: Diagnosis not present

## 2016-09-08 DIAGNOSIS — Z9049 Acquired absence of other specified parts of digestive tract: Secondary | ICD-10-CM | POA: Diagnosis not present

## 2016-09-08 DIAGNOSIS — J101 Influenza due to other identified influenza virus with other respiratory manifestations: Secondary | ICD-10-CM | POA: Diagnosis not present

## 2016-09-08 DIAGNOSIS — J09X2 Influenza due to identified novel influenza A virus with other respiratory manifestations: Secondary | ICD-10-CM | POA: Diagnosis present

## 2016-09-08 DIAGNOSIS — J4521 Mild intermittent asthma with (acute) exacerbation: Secondary | ICD-10-CM | POA: Diagnosis not present

## 2016-09-08 DIAGNOSIS — Z888 Allergy status to other drugs, medicaments and biological substances status: Secondary | ICD-10-CM | POA: Diagnosis not present

## 2016-09-08 DIAGNOSIS — J4532 Mild persistent asthma with status asthmaticus: Secondary | ICD-10-CM | POA: Diagnosis present

## 2016-09-08 DIAGNOSIS — Z87891 Personal history of nicotine dependence: Secondary | ICD-10-CM | POA: Diagnosis not present

## 2016-09-08 DIAGNOSIS — Z823 Family history of stroke: Secondary | ICD-10-CM | POA: Diagnosis not present

## 2016-09-08 DIAGNOSIS — Z8249 Family history of ischemic heart disease and other diseases of the circulatory system: Secondary | ICD-10-CM | POA: Diagnosis not present

## 2016-09-08 DIAGNOSIS — M1990 Unspecified osteoarthritis, unspecified site: Secondary | ICD-10-CM | POA: Diagnosis present

## 2016-09-08 MED ORDER — METHYLPREDNISOLONE SODIUM SUCC 40 MG IJ SOLR
40.0000 mg | Freq: Two times a day (BID) | INTRAMUSCULAR | Status: DC
  Administered 2016-09-08 – 2016-09-10 (×5): 40 mg via INTRAVENOUS
  Filled 2016-09-08 (×5): qty 1

## 2016-09-08 MED ORDER — ZOLPIDEM TARTRATE 5 MG PO TABS
5.0000 mg | ORAL_TABLET | Freq: Once | ORAL | Status: AC
  Administered 2016-09-08: 5 mg via ORAL
  Filled 2016-09-08: qty 1

## 2016-09-08 MED ORDER — FAMOTIDINE 20 MG PO TABS
20.0000 mg | ORAL_TABLET | Freq: Two times a day (BID) | ORAL | Status: DC
  Administered 2016-09-08 – 2016-09-10 (×5): 20 mg via ORAL
  Filled 2016-09-08 (×5): qty 1

## 2016-09-08 MED ORDER — ALBUTEROL SULFATE (2.5 MG/3ML) 0.083% IN NEBU
2.5000 mg | INHALATION_SOLUTION | Freq: Three times a day (TID) | RESPIRATORY_TRACT | Status: DC
  Administered 2016-09-08 – 2016-09-09 (×5): 2.5 mg via RESPIRATORY_TRACT
  Filled 2016-09-08 (×6): qty 3

## 2016-09-08 NOTE — Progress Notes (Signed)
PROGRESS NOTE    Wendy James   ZOX:[id number]  DOB: Jun 28, 1971  DOA: 09/06/2016 PCP: Massie Maroon, FNP   Brief Narrative:  Wendy James is a 46 y.o. female with medical history significant of Asthma.  Patient appears to have ran out of her preventative inhalers for asthma.  She presents to the ED with one day history of SOB, wheezing, productive cough, subjective fevers (Tm 102 in ED).  Symptoms are severe.  Not helped by albuterol inhaler at home.  No N/V/D.  Subjective: Cough, wheezing, congestion still present. Chest pain has improved with medications.   Assessment & Plan: Asthma exacerbation due to Influenza - cont Tamiflu,  Nebs more frequent,  cough suppressants and Motrin to control chest pain  - add Solumedrol and Flutter valve today - add Pepcid for GI protection as she is on NSAIDs and steroids   DVT prophylaxis: Lovenox Code Status: Full code Family Communication:  Disposition Plan: home tomorrow if stable Consultants:   none Procedures:   none Antimicrobials:  Anti-infectives    Start     Dose/Rate Route Frequency Ordered Stop   09/07/16 1000  oseltamivir (TAMIFLU) capsule 75 mg     75 mg Oral 2 times daily 09/06/16 2318 09/12/16 0959   09/06/16 2230  oseltamivir (TAMIFLU) capsule 75 mg     75 mg Oral  Once 09/06/16 2229 09/07/16 0027       Objective: Vitals:   09/07/16 1537 09/07/16 2054 09/08/16 0551 09/08/16 0800  BP:  121/78 (!) 142/84   Pulse:  74 77 69  Resp:  18 19 18   Temp:  97.5 F (36.4 C) 97.8 F (36.6 C)   TempSrc:  Oral Oral   SpO2: 98% 99% 97% 99%  Weight:      Height:        Intake/Output Summary (Last 24 hours) at 09/08/16 1027 Last data filed at 09/08/16 0606  Gross per 24 hour  Intake             1105 ml  Output                0 ml  Net             1105 ml   Filed Weights   09/06/16 2336  Weight: 91.7 kg (202 lb 1.6 oz)    Examination: General exam: Appears comfortable  HEENT: PERRLA, oral mucosa  moist, no sclera icterus or thrush Respiratory system: b/l wheezing- Respiratory effort normal. Cardiovascular system: S1 & S2 heard, RRR.  No murmurs  Gastrointestinal system: Abdomen soft, non-tender, nondistended. Normal bowel sound. No organomegaly Central nervous system: Alert and oriented. No focal neurological deficits. Extremities: No cyanosis, clubbing or edema Skin: No rashes or ulcers Psychiatry:  Mood & affect appropriate.     Data Reviewed: I have personally reviewed following labs and imaging studies  CBC:  Recent Labs Lab 09/06/16 1833 09/07/16 0539  WBC 4.7 4.2  NEUTROABS 2.6  --   HGB 12.3 11.4*  HCT 36.9 34.6*  MCV 87.0 88.0  PLT 205 187   Basic Metabolic Panel:  Recent Labs Lab 09/06/16 1833 09/07/16 0539  NA 137 136  K 3.4* 3.8  CL 105 105  CO2 24 24  GLUCOSE 92 117*  BUN 5* 7  CREATININE 0.69 0.68  CALCIUM 8.8* 8.3*   GFR: Estimated Creatinine Clearance: 89.8 mL/min (by C-G formula based on SCr of 0.68 mg/dL). Liver Function Tests: No results for input(s): AST, ALT, ALKPHOS, BILITOT,  PROT, ALBUMIN in the last 168 hours. No results for input(s): LIPASE, AMYLASE in the last 168 hours. No results for input(s): AMMONIA in the last 168 hours. Coagulation Profile: No results for input(s): INR, PROTIME in the last 168 hours. Cardiac Enzymes: No results for input(s): CKTOTAL, CKMB, CKMBINDEX, TROPONINI in the last 168 hours. BNP (last 3 results) No results for input(s): PROBNP in the last 8760 hours. HbA1C: No results for input(s): HGBA1C in the last 72 hours. CBG: No results for input(s): GLUCAP in the last 168 hours. Lipid Profile: No results for input(s): CHOL, HDL, LDLCALC, TRIG, CHOLHDL, LDLDIRECT in the last 72 hours. Thyroid Function Tests: No results for input(s): TSH, T4TOTAL, FREET4, T3FREE, THYROIDAB in the last 72 hours. Anemia Panel: No results for input(s): VITAMINB12, FOLATE, FERRITIN, TIBC, IRON, RETICCTPCT in the last 72  hours. Urine analysis:    Component Value Date/Time   COLORURINE YELLOW 08/23/2016 0758   APPEARANCEUR CLOUDY (A) 08/23/2016 0758   LABSPEC 1.024 08/23/2016 0758   PHURINE 5.0 08/23/2016 0758   GLUCOSEU NEGATIVE 08/23/2016 0758   HGBUR NEGATIVE 08/23/2016 0758   BILIRUBINUR NEGATIVE 08/23/2016 0758   KETONESUR NEGATIVE 08/23/2016 0758   PROTEINUR 30 (A) 08/23/2016 0758   UROBILINOGEN 0.2 11/07/2015 1524   NITRITE NEGATIVE 08/23/2016 0758   LEUKOCYTESUR TRACE (A) 08/23/2016 0758   Sepsis Labs: @LABRCNTIP (procalcitonin:4,lacticidven:4) ) Recent Results (from the past 240 hour(s))  Blood culture (routine x 2)     Status: None (Preliminary result)   Collection Time: 09/06/16  8:46 PM  Result Value Ref Range Status   Specimen Description BLOOD LEFT ANTECUBITAL  Final   Special Requests BOTTLES DRAWN AEROBIC AND ANAEROBIC 5CC EA  Final   Culture   Final    NO GROWTH < 24 HOURS Performed at [facility], [address] [place], [place] [address]    Report Status PENDING  Incomplete  Blood culture (routine x 2)     Status: None (Preliminary result)   Collection Time: 09/06/16  8:50 PM  Result Value Ref Range Status   Specimen Description BLOOD RIGHT ANTECUBITAL  Final   Special Requests BOTTLES DRAWN AEROBIC AND ANAEROBIC 5CC EA  Final   Culture   Final    NO GROWTH < 24 HOURS Performed at [facility], [address] [place], [place] [address]    Report Status PENDING  Incomplete         Radiology Studies: Dg Chest 2 View  Result Date: 09/06/2016 CLINICAL DATA:  Hypertension, shortness of breath, smoking history, chest pain EXAM: CHEST  2 VIEW COMPARISON:  Chest x-ray of 11/22/2015 FINDINGS: No active infiltrate or effusion is noted. Mediastinal and hilar contours are unremarkable. The heart is within normal limits in size. No bony abnormality is seen. IMPRESSION: No active cardiopulmonary disease. Electronically Signed   By: Dwyane Dee M.D.   On: 09/06/2016  15:24      Scheduled Meds: . albuterol  2.5 mg Nebulization TID  . benzonatate  200 mg Oral TID  . chlorpheniramine-HYDROcodone  5 mL Oral Q12H  . enoxaparin (LOVENOX) injection  40 mg Subcutaneous QHS  . fluticasone furoate-vilanterol  1 puff Inhalation Daily  . ibuprofen  400 mg Oral QID  . methylPREDNISolone (SOLU-MEDROL) injection  40 mg Intravenous Q12H  . montelukast  10 mg Oral QHS  . oseltamivir  75 mg Oral BID   Continuous Infusions: . sodium chloride 75 mL/hr at 09/08/16 0428     LOS: 0 days  Time spent in minutes: 35    Jerrilynn Mikowski, MD [facility] Pager: [email or web address] Password [email or web address] 09/08/2016, 10:27 AM

## 2016-09-09 DIAGNOSIS — J4520 Mild intermittent asthma, uncomplicated: Secondary | ICD-10-CM

## 2016-09-09 MED ORDER — ZOLPIDEM TARTRATE 5 MG PO TABS
5.0000 mg | ORAL_TABLET | Freq: Every evening | ORAL | Status: DC | PRN
  Administered 2016-09-09: 5 mg via ORAL
  Filled 2016-09-09: qty 1

## 2016-09-09 MED ORDER — GUAIFENESIN ER 600 MG PO TB12
1200.0000 mg | ORAL_TABLET | Freq: Two times a day (BID) | ORAL | Status: DC
  Administered 2016-09-09 – 2016-09-10 (×3): 1200 mg via ORAL
  Filled 2016-09-09 (×3): qty 2

## 2016-09-09 NOTE — Progress Notes (Signed)
RT explained flutter to patient and patient demonstrated back.

## 2016-09-09 NOTE — Progress Notes (Signed)
PROGRESS NOTE    Wendy James   XBJ:[id number]  DOB: 11/28/1970  DOA: 09/06/2016 PCP: Massie MaroonHollis,Lachina M, FNP   Brief Narrative:  Wendy James is a 46 y.o. female with medical history significant of Asthma.  Patient appears to have ran out of her preventative inhalers for asthma.  She presents to the ED with one day history of SOB, wheezing, productive cough, subjective fevers (Tm 102 in ED).  Symptoms are severe.  Not helped by albuterol inhaler at home.  No N/V/D.  Subjective: Cough, wheezing, congestion still present and unchanged from yesterday.    Assessment & Plan: Asthma exacerbation due to Influenza - still with severe rhonchi and DOE - cont Tamiflu,  Nebs  cough suppressants, Motrin, Solumedrol and Flutter valve   - add Pepcid for GI protection as she is on NSAIDs and steroids   DVT prophylaxis: Lovenox Code Status: Full code Family Communication:  Disposition Plan: home tomorrow if stable Consultants:   none Procedures:   none Antimicrobials:  Anti-infectives    Start     Dose/Rate Route Frequency Ordered Stop   09/07/16 1000  oseltamivir (TAMIFLU) capsule 75 mg     75 mg Oral 2 times daily 09/06/16 2318 09/12/16 0959   09/06/16 2230  oseltamivir (TAMIFLU) capsule 75 mg     75 mg Oral  Once 09/06/16 2229 09/07/16 0027       Objective: Vitals:   09/08/16 1510 09/08/16 2018 09/09/16 0459 09/09/16 0857  BP: (!) 145/97 137/83 (!) 157/96   Pulse: 80 77 77 81  Resp: 19 18 19 17   Temp: 98 F (36.7 C) 98.3 F (36.8 C) 98.2 F (36.8 C)   TempSrc: Oral Oral Oral   SpO2: 99% 95% 98% 99%  Weight:      Height:        Intake/Output Summary (Last 24 hours) at 09/09/16 0919 Last data filed at 09/09/16 [id number]  Gross per 24 hour  Intake          1791.25 ml  Output                0 ml  Net          1791.25 ml   Filed Weights   09/06/16 2336  Weight: 91.7 kg (202 lb 1.6 oz)    Examination: General exam: Appears comfortable  HEENT: PERRLA, oral  mucosa moist, no sclera icterus or thrush Respiratory system: b/l wheezing- Respiratory effort normal. Cardiovascular system: S1 & S2 heard, RRR.  No murmurs  Gastrointestinal system: Abdomen soft, non-tender, nondistended. Normal bowel sound. No organomegaly Central nervous system: Alert and oriented. No focal neurological deficits. Extremities: No cyanosis, clubbing or edema Skin: No rashes or ulcers Psychiatry:  Mood & affect appropriate.     Data Reviewed: I have personally reviewed following labs and imaging studies  CBC:  Recent Labs Lab 09/06/16 1833 09/07/16 0539  WBC 4.7 4.2  NEUTROABS 2.6  --   HGB 12.3 11.4*  HCT 36.9 34.6*  MCV 87.0 88.0  PLT 205 187   Basic Metabolic Panel:  Recent Labs Lab 09/06/16 1833 09/07/16 0539  NA 137 136  K 3.4* 3.8  CL 105 105  CO2 24 24  GLUCOSE 92 117*  BUN 5* 7  CREATININE 0.69 0.68  CALCIUM 8.8* 8.3*   GFR: Estimated Creatinine Clearance: 89.8 mL/min (by C-G formula based on SCr of 0.68 mg/dL). Liver Function Tests: No results for input(s): AST, ALT, ALKPHOS, BILITOT, PROT, ALBUMIN in the last  168 hours. No results for input(s): LIPASE, AMYLASE in the last 168 hours. No results for input(s): AMMONIA in the last 168 hours. Coagulation Profile: No results for input(s): INR, PROTIME in the last 168 hours. Cardiac Enzymes: No results for input(s): CKTOTAL, CKMB, CKMBINDEX, TROPONINI in the last 168 hours. BNP (last 3 results) No results for input(s): PROBNP in the last 8760 hours. HbA1C: No results for input(s): HGBA1C in the last 72 hours. CBG: No results for input(s): GLUCAP in the last 168 hours. Lipid Profile: No results for input(s): CHOL, HDL, LDLCALC, TRIG, CHOLHDL, LDLDIRECT in the last 72 hours. Thyroid Function Tests: No results for input(s): TSH, T4TOTAL, FREET4, T3FREE, THYROIDAB in the last 72 hours. Anemia Panel: No results for input(s): VITAMINB12, FOLATE, FERRITIN, TIBC, IRON, RETICCTPCT in the last  72 hours. Urine analysis:    Component Value Date/Time   COLORURINE YELLOW 08/23/2016 0758   APPEARANCEUR CLOUDY (A) 08/23/2016 0758   LABSPEC 1.024 08/23/2016 0758   PHURINE 5.0 08/23/2016 0758   GLUCOSEU NEGATIVE 08/23/2016 0758   HGBUR NEGATIVE 08/23/2016 0758   BILIRUBINUR NEGATIVE 08/23/2016 0758   KETONESUR NEGATIVE 08/23/2016 0758   PROTEINUR 30 (A) 08/23/2016 0758   UROBILINOGEN 0.2 11/07/2015 1524   NITRITE NEGATIVE 08/23/2016 0758   LEUKOCYTESUR TRACE (A) 08/23/2016 0758   Sepsis Labs: @LABRCNTIP (procalcitonin:4,lacticidven:4) ) Recent Results (from the past 240 hour(s))  Blood culture (routine x 2)     Status: None (Preliminary result)   Collection Time: 09/06/16  8:46 PM  Result Value Ref Range Status   Specimen Description BLOOD LEFT ANTECUBITAL  Final   Special Requests BOTTLES DRAWN AEROBIC AND ANAEROBIC 5CC EA  Final   Culture   Final    NO GROWTH 2 DAYS Performed at [facility], [address], [place], [place] [address]    Report Status PENDING  Incomplete  Blood culture (routine x 2)     Status: None (Preliminary result)   Collection Time: 09/06/16  8:50 PM  Result Value Ref Range Status   Specimen Description BLOOD RIGHT ANTECUBITAL  Final   Special Requests BOTTLES DRAWN AEROBIC AND ANAEROBIC 5CC EA  Final   Culture   Final    NO GROWTH 2 DAYS Performed at [facility], [address], [place], [place] [address]    Report Status PENDING  Incomplete         Radiology Studies: No results found.    Scheduled Meds: . albuterol  2.5 mg Nebulization TID  . benzonatate  200 mg Oral TID  . chlorpheniramine-HYDROcodone  5 mL Oral Q12H  . enoxaparin (LOVENOX) injection  40 mg Subcutaneous QHS  . famotidine  20 mg Oral BID  . fluticasone furoate-vilanterol  1 puff Inhalation Daily  . ibuprofen  400 mg Oral QID  . methylPREDNISolone (SOLU-MEDROL) injection  40 mg Intravenous Q12H  . montelukast  10 mg Oral QHS  . oseltamivir  75 mg  Oral BID   Continuous Infusions: . sodium chloride 75 mL/hr at 09/08/16 1706     LOS: 1 day    Time spent in minutes: 35    Wesam Gearhart, MD [facility] Pager: [email or web address] Password [facility] 09/09/2016, 9:19 AM

## 2016-09-10 MED ORDER — BUDESONIDE-FORMOTEROL FUMARATE 80-4.5 MCG/ACT IN AERO: 2.0000 | INHALATION_SPRAY | Freq: Two times a day (BID) | RESPIRATORY_TRACT | 3 refills | Status: DC

## 2016-09-10 MED ORDER — FAMOTIDINE 20 MG PO TABS: 20.0000 mg | ORAL_TABLET | Freq: Two times a day (BID) | ORAL | 0 refills | Status: DC

## 2016-09-10 MED ORDER — AMLODIPINE BESYLATE 5 MG PO TABS: 5.0000 mg | ORAL_TABLET | Freq: Every day | ORAL | 5 refills | Status: DC

## 2016-09-10 MED ORDER — PREDNISONE 10 MG PO TABS: ORAL_TABLET | ORAL | 0 refills | Status: DC

## 2016-09-10 MED ORDER — GUAIFENESIN ER 600 MG PO TB12: 1200.0000 mg | ORAL_TABLET | Freq: Two times a day (BID) | ORAL | 0 refills | Status: DC

## 2016-09-10 MED ORDER — MONTELUKAST SODIUM 10 MG PO TABS: 10.0000 mg | ORAL_TABLET | Freq: Every day | ORAL | 11 refills | Status: DC

## 2016-09-10 MED ORDER — BENZONATATE 200 MG PO CAPS: 200.0000 mg | ORAL_CAPSULE | Freq: Three times a day (TID) | ORAL | 0 refills | Status: DC

## 2016-09-10 MED ORDER — ALBUTEROL SULFATE HFA 108 (90 BASE) MCG/ACT IN AERS: INHALATION_SPRAY | RESPIRATORY_TRACT | 2 refills | Status: DC

## 2016-09-10 MED ORDER — OSELTAMIVIR PHOSPHATE 75 MG PO CAPS: 75.0000 mg | ORAL_CAPSULE | Freq: Two times a day (BID) | ORAL | 0 refills | Status: DC

## 2016-09-10 MED ORDER — HYDROCOD POLST-CPM POLST ER 10-8 MG/5ML PO SUER: 5.0000 mL | Freq: Two times a day (BID) | ORAL | 0 refills | Status: DC

## 2016-09-10 MED FILL — SYMBICORT 80-4.5 MCG INH: 80-4.5 | 30 days supply | Qty: 10 | Fill #0

## 2016-09-10 MED FILL — VENTOLIN HFA 90 MCG INHALER: 108 (90 BAS | 25 days supply | Qty: 18 | Fill #0

## 2016-09-10 MED FILL — OSELTAMIVIR PHOS 75 MG CAP: 75 | 1 days supply | Qty: 2 | Fill #0

## 2016-09-10 MED FILL — AMLODIPINE BESYLATE 5 MG TA: 5 | 30 days supply | Qty: 30 | Fill #0

## 2016-09-10 MED FILL — MONTELUKAST SOD 10 MG TAB: 10 | 30 days supply | Qty: 30 | Fill #0

## 2016-09-10 MED FILL — BENZONATATE 100 MG CAPSULE: 100 | 7 days supply | Qty: 40 | Fill #0

## 2016-09-10 MED FILL — FAMOTIDINE 20 MG TABLET: 20 | 10 days supply | Qty: 20 | Fill #0

## 2016-09-10 NOTE — Discharge Summary (Signed)
Physician Discharge Summary  Junko Ohagan Gent ZOX:[id number] DOB: 1970-08-23 DOA: 09/06/2016  PCP: Massie Maroon, FNP  Admit date: 09/06/2016 Discharge date: 09/10/2016  Admitted From:home Disposition:  home   Discharge Condition:  stable   CODE STATUS:  Full code   Diet recommendation:  Heart healthy Consultations:      Discharge Diagnoses:  Principal Problem:   Influenza A with respiratory manifestations Active Problems:   Asthma exacerbation  Subjective: Cough improving. No dyspnea.   Brief Summary: Wendy Bergeron Sheptockis a 46 y.o.femalewith medical history significant of Asthma. Patient appears to have ran out of her preventative inhalers for asthma. She presents to the ED with one day history of SOB, wheezing, productive cough, subjective fevers (Tm 102 in ED). Symptoms are severe. Not helped by albuterol inhaler at home. No N/V/D.  Hospital Course:  Asthma exacerbation due to Influenza - improved enough to go home today - cont Tamiflu,  Symbicort, cough suppressants  Flutter valve  - Solumedrol switched to Prednisone  - add Pepcid for GI protection as she is on PRN NSAIDs and steroids  Chest pain  - due to cough- improving with Motrin  Discharge Instructions  Discharge Instructions    Diet - low sodium heart healthy    Complete by:  As directed    Discharge instructions    Complete by:  As directed    Please try to lose 20 lbs.   Increase activity slowly    Complete by:  As directed      Allergies as of 09/10/2016      Reactions   Mobic [meloxicam] Hives   Pt reports she gets real bad hives from Mobic   Shrimp [shellfish Allergy] Other (See Comments)   Wheezing.  Patient states she is ok with betadine      Medication List    TAKE these medications   albuterol (2.5 MG/3ML) 0.083% nebulizer solution Commonly known as:  PROVENTIL Take 3 mLs (2.5 mg total) by nebulization every 6 (six) hours as needed for wheezing or shortness of breath. Reported  on 08/17/2015 What changed:  Another medication with the same name was changed. Make sure you understand how and when to take each.   albuterol 108 (90 Base) MCG/ACT inhaler Commonly known as:  VENTOLIN HFA INHALE 2 PUFFS INTO THE LUNGS EVERY 6 HOURS AS NEEDED FOR WHEEZING OR SHORTNESS OF BREATH What changed:  See the new instructions.   amLODipine 5 MG tablet Commonly known as:  NORVASC Take 1 tablet (5 mg total) by mouth daily.   benzonatate 200 MG capsule Commonly known as:  TESSALON Take 1 capsule (200 mg total) by mouth 3 (three) times daily.   budesonide-formoterol 80-4.5 MCG/ACT inhaler Commonly known as:  SYMBICORT Inhale 2 puffs into the lungs 2 (two) times daily.   chlorpheniramine-HYDROcodone 10-8 MG/5ML Suer Commonly known as:  TUSSIONEX Take 5 mLs by mouth every 12 (twelve) hours.   famotidine 20 MG tablet Commonly known as:  PEPCID Take 1 tablet (20 mg total) by mouth 2 (two) times daily.   guaiFENesin 600 MG 12 hr tablet Commonly known as:  MUCINEX Take 2 tablets (1,200 mg total) by mouth 2 (two) times daily.   montelukast 10 MG tablet Commonly known as:  SINGULAIR Take 1 tablet (10 mg total) by mouth at bedtime.   oseltamivir 75 MG capsule Commonly known as:  TAMIFLU Take 1 capsule (75 mg total) by mouth 2 (two) times daily.   predniSONE 10 MG tablet Commonly known as:  DELTASONE 60 mg tomorrow, taper by 10 mg daily until complete   promethazine 25 MG tablet Commonly known as:  PHENERGAN Take 1 tablet (25 mg total) by mouth every 6 (six) hours as needed for nausea or vomiting.       Allergies  Allergen Reactions  . Mobic [Meloxicam] Hives    Pt reports she gets real bad hives from Mobic  . Shrimp [Shellfish Allergy] Other (See Comments)    Wheezing.  Patient states she is ok with betadine     Procedures/Studies:    Dg Chest 2 View  Result Date: 09/06/2016 CLINICAL DATA:  Hypertension, shortness of breath, smoking history, chest pain EXAM:  CHEST  2 VIEW COMPARISON:  Chest x-ray of 11/22/2015 FINDINGS: No active infiltrate or effusion is noted. Mediastinal and hilar contours are unremarkable. The heart is within normal limits in size. No bony abnormality is seen. IMPRESSION: No active cardiopulmonary disease. Electronically Signed   By: Dwyane DeePaul  Barry M.D.   On: 09/06/2016 15:24        Discharge Exam: Vitals:   09/09/16 2250 09/10/16 0500  BP: (!) 162/96 (!) 158/97  Pulse:  65  Resp:  18  Temp:  97.6 F (36.4 C)   Vitals:   09/09/16 2027 09/09/16 2250 09/10/16 0500 09/10/16 0913  BP: (!) 179/116 (!) 162/96 (!) 158/97   Pulse: 72  65   Resp: 20  18   Temp: 98.5 F (36.9 C)  97.6 F (36.4 C)   TempSrc: Oral  Oral   SpO2: 99%  95% 97%  Weight:      Height:        General: Pt is alert, awake, not in acute distress Cardiovascular: RRR, S1/S2 +, no rubs, no gallops Respiratory: CTA bilaterally, no wheezing, no rhonchi Abdominal: Soft, NT, ND, bowel sounds + Extremities: no edema, no cyanosis    The results of significant diagnostics from this hospitalization (including imaging, microbiology, ancillary and laboratory) are listed below for reference.     Microbiology: Recent Results (from the past 240 hour(s))  Blood culture (routine x 2)     Status: None (Preliminary result)   Collection Time: 09/06/16  8:46 PM  Result Value Ref Range Status   Specimen Description BLOOD LEFT ANTECUBITAL  Final   Special Requests BOTTLES DRAWN AEROBIC AND ANAEROBIC 5CC EA  Final   Culture   Final    NO GROWTH 3 DAYS Performed at [facility], [address] [place], [place] [address]    Report Status PENDING  Incomplete  Blood culture (routine x 2)     Status: None (Preliminary result)   Collection Time: 09/06/16  8:50 PM  Result Value Ref Range Status   Specimen Description BLOOD RIGHT ANTECUBITAL  Final   Special Requests BOTTLES DRAWN AEROBIC AND ANAEROBIC 5CC EA  Final   Culture   Final    NO GROWTH 3  DAYS Performed at [facility], [address] [place], [place] [address]    Report Status PENDING  Incomplete     Labs: BNP (last 3 results) No results for input(s): BNP in the last 8760 hours. Basic Metabolic Panel:  Recent Labs Lab 09/06/16 1833 09/07/16 0539  NA 137 136  K 3.4* 3.8  CL 105 105  CO2 24 24  GLUCOSE 92 117*  BUN 5* 7  CREATININE 0.69 0.68  CALCIUM 8.8* 8.3*   Liver Function Tests: No results for input(s): AST, ALT, ALKPHOS, BILITOT, PROT, ALBUMIN in the  last 168 hours. No results for input(s): LIPASE, AMYLASE in the last 168 hours. No results for input(s): AMMONIA in the last 168 hours. CBC:  Recent Labs Lab 09/06/16 1833 09/07/16 0539  WBC 4.7 4.2  NEUTROABS 2.6  --   HGB 12.3 11.4*  HCT 36.9 34.6*  MCV 87.0 88.0  PLT 205 187   Cardiac Enzymes: No results for input(s): CKTOTAL, CKMB, CKMBINDEX, TROPONINI in the last 168 hours. BNP: Invalid input(s): POCBNP CBG: No results for input(s): GLUCAP in the last 168 hours. D-Dimer No results for input(s): DDIMER in the last 72 hours. Hgb A1c No results for input(s): HGBA1C in the last 72 hours. Lipid Profile No results for input(s): CHOL, HDL, LDLCALC, TRIG, CHOLHDL, LDLDIRECT in the last 72 hours. Thyroid function studies No results for input(s): TSH, T4TOTAL, T3FREE, THYROIDAB in the last 72 hours.  Invalid input(s): FREET3 Anemia work up No results for input(s): VITAMINB12, FOLATE, FERRITIN, TIBC, IRON, RETICCTPCT in the last 72 hours. Urinalysis    Component Value Date/Time   COLORURINE YELLOW 08/23/2016 0758   APPEARANCEUR CLOUDY (A) 08/23/2016 0758   LABSPEC 1.024 08/23/2016 0758   PHURINE 5.0 08/23/2016 0758   GLUCOSEU NEGATIVE 08/23/2016 0758   HGBUR NEGATIVE 08/23/2016 0758   BILIRUBINUR NEGATIVE 08/23/2016 0758   KETONESUR NEGATIVE 08/23/2016 0758   PROTEINUR 30 (A) 08/23/2016 0758   UROBILINOGEN 0.2 11/07/2015 1524   NITRITE NEGATIVE 08/23/2016 0758   LEUKOCYTESUR  TRACE (A) 08/23/2016 0758   Sepsis Labs Invalid input(s): PROCALCITONIN,  WBC,  LACTICIDVEN Microbiology Recent Results (from the past 240 hour(s))  Blood culture (routine x 2)     Status: None (Preliminary result)   Collection Time: 09/06/16  8:46 PM  Result Value Ref Range Status   Specimen Description BLOOD LEFT ANTECUBITAL  Final   Special Requests BOTTLES DRAWN AEROBIC AND ANAEROBIC 5CC EA  Final   Culture   Final    NO GROWTH 3 DAYS Performed at [facility], [address], [place], [place] [address]    Report Status PENDING  Incomplete  Blood culture (routine x 2)     Status: None (Preliminary result)   Collection Time: 09/06/16  8:50 PM  Result Value Ref Range Status   Specimen Description BLOOD RIGHT ANTECUBITAL  Final   Special Requests BOTTLES DRAWN AEROBIC AND ANAEROBIC 5CC EA  Final   Culture   Final    NO GROWTH 3 DAYS Performed at [facility], [address], [place], [place] [address]    Report Status PENDING  Incomplete     Time coordinating discharge: Over 30 minutes  SIGNED:   Calvert Cantor, MD  [facility] 09/10/2016, 9:27 AM Pager   If 7PM-7AM, please contact night-coverage [email or web address] Password [email or web address]

## 2016-09-11 LAB — CULTURE, BLOOD (ROUTINE X 2)
Culture: NO GROWTH
Culture: NO GROWTH

## 2016-12-18 MED FILL — !VENTOLIN HFA INHALER: 108 (90 BAS | 25 days supply | Qty: 18 | Fill #1

## 2017-02-26 MED FILL — SYMBICORT 80-4.5 MCG INH: 80-4.5 | 30 days supply | Qty: 10 | Fill #1

## 2017-02-26 MED FILL — AMLODIPINE BESYLATE 5 MG TA: 5 | 30 days supply | Qty: 30 | Fill #1

## 2017-02-26 MED FILL — !VENTOLIN HFA INHALER: 108 (90 BAS | 25 days supply | Qty: 18 | Fill #2

## 2017-02-26 MED FILL — MONTELUKAST SOD 10 MG TAB: 10 | 30 days supply | Qty: 30 | Fill #1

## 2017-04-30 MED FILL — SYMBICORT 80-4.5 MCG INH: 80-4.5 | 30 days supply | Qty: 10 | Fill #2

## 2017-05-02 ENCOUNTER — Encounter: Payer: Self-pay | Admitting: Family Medicine

## 2017-05-02 ENCOUNTER — Ambulatory Visit (INDEPENDENT_AMBULATORY_CARE_PROVIDER_SITE_OTHER): Payer: BLUE CROSS/BLUE SHIELD | Admitting: Family Medicine

## 2017-05-02 VITALS — BP 142/84 | HR 71 | Temp 98.9°F | Resp 16 | Ht 62.0 in | Wt 211.0 lb

## 2017-05-02 DIAGNOSIS — J4520 Mild intermittent asthma, uncomplicated: Secondary | ICD-10-CM

## 2017-05-02 DIAGNOSIS — K589 Irritable bowel syndrome without diarrhea: Secondary | ICD-10-CM | POA: Diagnosis not present

## 2017-05-02 DIAGNOSIS — J3089 Other allergic rhinitis: Secondary | ICD-10-CM | POA: Diagnosis not present

## 2017-05-02 DIAGNOSIS — Z91013 Allergy to seafood: Secondary | ICD-10-CM | POA: Diagnosis not present

## 2017-05-02 DIAGNOSIS — I10 Essential (primary) hypertension: Secondary | ICD-10-CM | POA: Diagnosis not present

## 2017-05-02 LAB — POCT URINALYSIS DIP (DEVICE)
BILIRUBIN URINE: NEGATIVE
Glucose, UA: NEGATIVE mg/dL
Hgb urine dipstick: NEGATIVE
KETONES UR: NEGATIVE mg/dL
Nitrite: NEGATIVE
PH: 6 (ref 5.0–8.0)
Protein, ur: NEGATIVE mg/dL
Specific Gravity, Urine: 1.015 (ref 1.005–1.030)
Urobilinogen, UA: 0.2 mg/dL (ref 0.0–1.0)

## 2017-05-02 LAB — BASIC METABOLIC PANEL WITH GFR
BUN: 10 mg/dL (ref 7–25)
CALCIUM: 8.5 mg/dL — AB (ref 8.6–10.2)
CO2: 25 mmol/L (ref 20–32)
CREATININE: 0.65 mg/dL (ref 0.50–1.10)
Chloride: 103 mmol/L (ref 98–110)
GFR, Est African American: 122 mL/min/{1.73_m2} (ref >=60)
GFR, Est Non African American: 105 mL/min/{1.73_m2} (ref >=60)
GLUCOSE: 99 mg/dL (ref 65–99)
Potassium: 4 mmol/L (ref 3.5–5.3)
SODIUM: 136 mmol/L (ref 135–146)

## 2017-05-02 MED ORDER — MONTELUKAST SODIUM 10 MG PO TABS: 10.0000 mg | ORAL_TABLET | Freq: Every day | ORAL | 11 refills | Status: DC

## 2017-05-02 MED ORDER — ALBUTEROL SULFATE HFA 108 (90 BASE) MCG/ACT IN AERS: INHALATION_SPRAY | RESPIRATORY_TRACT | 2 refills | Status: DC

## 2017-05-02 MED ORDER — LINACLOTIDE 72 MCG PO CAPS: 72.0000 ug | ORAL_CAPSULE | Freq: Every day | ORAL | 5 refills | Status: DC

## 2017-05-02 MED ORDER — EPINEPHRINE 0.3 MG/0.3ML IJ SOAJ: 0.3000 mg | Freq: Once | INTRAMUSCULAR | 1 refills | Status: AC

## 2017-05-02 MED ORDER — BUDESONIDE-FORMOTEROL FUMARATE 80-4.5 MCG/ACT IN AERO: 2.0000 | INHALATION_SPRAY | Freq: Two times a day (BID) | RESPIRATORY_TRACT | 5 refills | Status: DC

## 2017-05-02 MED ORDER — CETIRIZINE HCL 10 MG PO TABS: 10.0000 mg | ORAL_TABLET | Freq: Every day | ORAL | 11 refills | Status: DC

## 2017-05-02 MED FILL — !LINZESS 72 MCG CAPSULE: 72 MCG | 30 days supply | Qty: 30 | Fill #0

## 2017-05-02 NOTE — Progress Notes (Signed)
Subjective:    Patient ID: Wendy James, female    DOB: August 05, 1970, 46 y.o.   MRN: [id number]  Wendy James, a 46 year old female presents for a follow up of asthma and hypertension.    Hypertension  This is a recurrent problem. The current episode started more than 1 month ago. The problem has been rapidly improving since onset. The problem is controlled. Pertinent negatives include no anxiety, blurred vision, chest pain, headaches, malaise/fatigue, neck pain, orthopnea, palpitations, peripheral edema, PND, shortness of breath or sweats. There are no associated agents to hypertension. Risk factors for coronary artery disease include obesity and sedentary lifestyle. Past treatments include calcium channel blockers. The current treatment provides moderate improvement. Compliance problems: Patient has been out of medication for greater than 1 week.   Asthma  She complains of cough and wheezing. There is no chest tightness, frequent throat clearing, hemoptysis, hoarse voice, shortness of breath or sputum production. Primary symptoms comments: Patient was recently evaluated in the emergency department and has completed a course of steroids.. This is a recurrent problem. The problem occurs intermittently. The problem has been unchanged. Associated symptoms include nasal congestion, postnasal drip and weight loss. Pertinent negatives include no chest pain, ear congestion, ear pain, fever, headaches, malaise/fatigue, PND, sneezing, sore throat, sweats or trouble swallowing. Her symptoms are aggravated by change in weather and pollen. Her symptoms are alleviated by beta-agonist. She reports minimal improvement on treatment. Her past medical history is significant for asthma.   Past Medical History:  Diagnosis Date  . 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 Asthma   . Seasonal allergies    Social History   Social History  . Marital status: Married    Spouse name: N/A  . Number of children: N/A  .  Years of education: N/A   Occupational History  . Not on file.   Social History Main Topics  . Smoking status: Former Smoker    Packs/day: 0.75    Types: Cigarettes    Quit date: 11/17/1988  . Smokeless tobacco: Never Used  . Alcohol use No  . Drug use: No  . Sexual activity: Yes    Birth control/ protection: Surgical   Other Topics Concern  . Not on file   Social History Narrative  . No narrative on file   Immunization History  Administered Date(s) Administered  . Influenza Split 04/11/2012  . Influenza,inj,Quad PF,6+ Mos 04/30/2015  . Pneumococcal Polysaccharide-23 04/30/2015   Review of Systems  Constitutional: Positive for weight loss. Negative for fever, malaise/fatigue and unexpected weight change.  HENT: Positive for postnasal drip. Negative for ear pain, hoarse voice, sneezing, sore throat and trouble swallowing.   Eyes: Negative.  Negative for blurred vision.  Respiratory: Positive for cough and wheezing. Negative for hemoptysis, sputum production and shortness of breath.   Cardiovascular: Negative.  Negative for chest pain, palpitations, orthopnea and PND.  Gastrointestinal: Positive for constipation.  Endocrine: Negative.   Genitourinary: Negative.   Musculoskeletal: Negative.  Negative for neck pain.  Skin: Negative.   Allergic/Immunologic: Negative.   Neurological: Negative.  Negative for headaches.  Hematological: Negative.   Psychiatric/Behavioral: Negative.  Negative for sleep disturbance and suicidal ideas.       Objective:   Physical Exam  Constitutional: She is oriented to person, place, and time. She appears well-developed and well-nourished.  HENT:  Head: Normocephalic and atraumatic.  Right Ear: External ear normal.  Left Ear: External ear normal.  Mouth/Throat: Oropharynx is  clear and moist.  Eyes: Pupils are equal, round, and reactive to light. Conjunctivae and EOM are normal.  Neck: Normal range of motion. Neck supple.  Cardiovascular:  Normal rate, regular rhythm and intact distal pulses.   Pulmonary/Chest: Effort normal.  Abdominal: Soft. Bowel sounds are normal.  Musculoskeletal: Normal range of motion.  Neurological: She is alert and oriented to person, place, and time. She has normal reflexes.  Skin: Skin is warm and dry.  Psychiatric: She has a normal mood and affect. Her behavior is normal. Judgment and thought content normal.      BP (!) 142/84 (BP Location: Left Arm, Patient Position: Sitting, Cuff Size: Large) Comment: manually  Pulse 71   Temp 98.9 F (37.2 C) (Oral)   Resp 16   Ht  (1.575 m)   Wt 211 lb (95.7 kg)   LMP 04/18/2017   SpO2 99%   BMI 38.59 kg/m  Assessment & Plan:  1. Mild intermittent asthma, uncomplicated - albuterol (VENTOLIN HFA) 108 (90 Base) MCG/ACT inhaler; INHALE 2 PUFFS INTO THE LUNGS EVERY 4 HOURS AS NEEDED FOR WHEEZING OR SHORTNESS OF BREATH  Dispense: 18 g; Refill: 2 - budesonide-formoterol (SYMBICORT) 80-4.5 MCG/ACT inhaler; Inhale 2 puffs into the lungs 2 (two) times daily.  Dispense: 3 Inhaler; Refill: 5 - montelukast (SINGULAIR) 10 MG tablet; Take 1 tablet (10 mg total) by mouth at bedtime.  Dispense: 30 tablet; Refill: 11  2. Non-seasonal allergic rhinitis, unspecified trigger - cetirizine (ZYRTEC) 10 MG tablet; Take 1 tablet (10 mg total) by mouth daily.  Dispense: 30 tablet; Refill: 11  3. Essential hypertension Blood pressure is at goal on current medication regimen No medication changes warranted - BASIC METABOLIC PANEL WITH GFR  4. Irritable bowel syndrome, unspecified type Will start a trial of Linzess for IBS. She has attempted OTC metamucil and Miralax without relief.  - linaclotide (LINZESS) 72 MCG capsule; Take 1 capsule (72 mcg total) by mouth daily before breakfast.  Dispense: 30 capsule; Refill: 5  5. Allergy history, seafood - EPINEPHrine 0.3 mg/0.3 mL IJ SOAJ injection; Inject 0.3 mLs (0.3 mg total) into the muscle once.  Dispense: 1 Device; Refill:  1   RTC: 3 months for asthma   Nolon Nations  MSN, FNP-C Patient [facility] [address] [place], [place] [address] [phone]

## 2017-05-02 NOTE — Patient Instructions (Addendum)
Irritable bowel syndrome with constipation Will start a trial of Linzess 72 mcg daily with breakfast A high fiber diet with plenty of fluids (up to 8 glasses of water daily) is suggested to relieve these symptoms.     Albuterol inhaler every 4 hours as needed for wheezing, shortness of breath or cough Will continue Singulair 10 mg daily     Recommend a lowfat, low carbohydrate diet divided over 5-6 small meals, increase water intake to 6-8 glasses, and 150 minutes per week of cardiovascular exercise.    Recommend ketogenic diet    Asthma, Adult Asthma is a recurring condition in which the airways tighten and narrow. Asthma can make it difficult to breathe. It can cause coughing, wheezing, and shortness of breath. Asthma episodes, also called asthma attacks, range from minor to life-threatening. Asthma cannot be cured, but medicines and lifestyle changes can help control it. What are the causes? Asthma is believed to be caused by inherited (genetic) and environmental factors, but its exact cause is unknown. Asthma may be triggered by allergens, lung infections, or irritants in the air. Asthma triggers are different for each person. Common triggers include:  Animal dander.  Dust mites.  Cockroaches.  Pollen from trees or grass.  Mold.  Smoke.  Air pollutants such as dust, household cleaners, hair sprays, aerosol sprays, paint fumes, strong chemicals, or strong odors.  Cold air, weather changes, and winds (which increase molds and pollens in the air).  Strong emotional expressions such as crying or laughing hard.  Stress.  Certain medicines (such as aspirin) or types of drugs (such as beta-blockers).  Sulfites in foods and drinks. Foods and drinks that may contain sulfites include dried fruit, potato chips, and sparkling grape juice.  Infections or inflammatory conditions such as the flu, a cold, or an inflammation of the nasal membranes (rhinitis).  Gastroesophageal  reflux disease (GERD).  Exercise or strenuous activity.  What are the signs or symptoms? Symptoms may occur immediately after asthma is triggered or many hours later. Symptoms include:  Wheezing.  Excessive nighttime or early morning coughing.  Frequent or severe coughing with a common cold.  Chest tightness.  Shortness of breath.  How is this diagnosed? The diagnosis of asthma is made by a review of your medical history and a physical exam. Tests may also be performed. These may include:  Lung function studies. These tests show how much air you breathe in and out.  Allergy tests.  Imaging tests such as X-rays.  How is this treated? Asthma cannot be cured, but it can usually be controlled. Treatment involves identifying and avoiding your asthma triggers. It also involves medicines. There are 2 classes of medicine used for asthma treatment:  Controller medicines. These prevent asthma symptoms from occurring. They are usually taken every day.  Reliever or rescue medicines. These quickly relieve asthma symptoms. They are used as needed and provide short-term relief.  Your health care provider will help you create an asthma action plan. An asthma action plan is a written plan for managing and treating your asthma attacks. It includes a list of your asthma triggers and how they may be avoided. It also includes information on when medicines should be taken and when their dosage should be changed. An action plan may also involve the use of a device called a peak flow meter. A peak flow meter measures how well the lungs are working. It helps you monitor your condition. Follow these instructions at home:  Take medicines only as  directed by your health care provider. Speak with your health care provider if you have questions about how or when to take the medicines.  Use a peak flow meter as directed by your health care provider. Record and keep track of readings.  Understand and use the  action plan to help minimize or stop an asthma attack without needing to seek medical care.  Control your home environment in the following ways to help prevent asthma attacks: ? Do not smoke. Avoid being exposed to secondhand smoke. ? Change your heating and air conditioning filter regularly. ? Limit your use of fireplaces and wood stoves. ? Get rid of pests (such as roaches and mice) and their droppings. ? Throw away plants if you see mold on them. ? Clean your floors and dust regularly. Use unscented cleaning products. ? Try to have someone else vacuum for you regularly. Stay out of rooms while they are being vacuumed and for a short while afterward. If you vacuum, use a dust mask from a hardware store, a double-layered or microfilter vacuum cleaner bag, or a vacuum cleaner with a HEPA filter. ? Replace carpet with wood, tile, or vinyl flooring. Carpet can trap dander and dust. ? Use allergy-proof pillows, mattress covers, and box spring covers. ? Wash bed sheets and blankets every week in hot water and dry them in a dryer. ? Use blankets that are made of polyester or cotton. ? Clean bathrooms and kitchens with bleach. If possible, have someone repaint the walls in these rooms with mold-resistant paint. Keep out of the rooms that are being cleaned and painted. ? Wash hands frequently. Contact a health care provider if:  You have wheezing, shortness of breath, or a cough even if taking medicine to prevent attacks.  The colored mucus you cough up (sputum) is thicker than usual.  Your sputum changes from clear or white to yellow, green, gray, or bloody.  You have any problems that may be related to the medicines you are taking (such as a rash, itching, swelling, or trouble breathing).  You are using a reliever medicine more than 2-3 times per week.  Your peak flow is still at 50-79% of your personal best after following your action plan for 1 hour.  You have a fever. Get help right  away if:  You seem to be getting worse and are unresponsive to treatment during an asthma attack.  You are short of breath even at rest.  You get short of breath when doing very little physical activity.  You have difficulty eating, drinking, or talking due to asthma symptoms.  You develop chest pain.  You develop a fast heartbeat.  You have a bluish color to your lips or fingernails.  You are light-headed, dizzy, or faint.  Your peak flow is less than 50% of your personal best. This information is not intended to replace advice given to you by your health care provider. Make sure you discuss any questions you have with your health care provider. Document Released: 07/16/2005 Document Revised: 12/28/2015 Document Reviewed: 02/12/2013 Elsevier Interactive Patient Education  2017 [employer].

## 2017-05-06 ENCOUNTER — Other Ambulatory Visit: Payer: Self-pay

## 2017-05-06 DIAGNOSIS — J4520 Mild intermittent asthma, uncomplicated: Secondary | ICD-10-CM

## 2017-05-06 MED ORDER — ALBUTEROL SULFATE HFA 108 (90 BASE) MCG/ACT IN AERS: INHALATION_SPRAY | RESPIRATORY_TRACT | 2 refills | Status: DC

## 2017-05-06 MED FILL — !VENTOLIN HFA INHALER: 108 (90 BAS | 16 days supply | Qty: 18 | Fill #0

## 2017-07-16 ENCOUNTER — Other Ambulatory Visit: Payer: Self-pay | Admitting: Family Medicine

## 2017-07-16 DIAGNOSIS — J4520 Mild intermittent asthma, uncomplicated: Secondary | ICD-10-CM

## 2017-07-27 ENCOUNTER — Other Ambulatory Visit: Payer: Self-pay

## 2017-07-27 ENCOUNTER — Encounter (HOSPITAL_COMMUNITY): Payer: Self-pay | Admitting: Emergency Medicine

## 2017-07-27 ENCOUNTER — Emergency Department (HOSPITAL_COMMUNITY): Payer: BLUE CROSS/BLUE SHIELD

## 2017-07-27 ENCOUNTER — Emergency Department (HOSPITAL_COMMUNITY)
Admission: EM | Admit: 2017-07-27 | Discharge: 2017-07-27 | Disposition: A | Discharge status: Home / Self Care | Payer: BLUE CROSS/BLUE SHIELD | Source: Home / Self Care | Attending: Emergency Medicine | Admitting: Emergency Medicine

## 2017-07-27 ENCOUNTER — Emergency Department (HOSPITAL_COMMUNITY)
Admission: EM | Admit: 2017-07-27 | Discharge: 2017-07-27 | Disposition: A | Discharge status: Home / Self Care | Payer: BLUE CROSS/BLUE SHIELD | Attending: Emergency Medicine | Admitting: Emergency Medicine

## 2017-07-27 ENCOUNTER — Encounter (HOSPITAL_COMMUNITY): Payer: Self-pay

## 2017-07-27 DIAGNOSIS — R062 Wheezing: Secondary | ICD-10-CM | POA: Diagnosis present

## 2017-07-27 DIAGNOSIS — Z79899 Other long term (current) drug therapy: Secondary | ICD-10-CM

## 2017-07-27 DIAGNOSIS — Z87891 Personal history of nicotine dependence: Secondary | ICD-10-CM | POA: Insufficient documentation

## 2017-07-27 DIAGNOSIS — I10 Essential (primary) hypertension: Secondary | ICD-10-CM | POA: Insufficient documentation

## 2017-07-27 DIAGNOSIS — J4521 Mild intermittent asthma with (acute) exacerbation: Secondary | ICD-10-CM | POA: Diagnosis not present

## 2017-07-27 DIAGNOSIS — B349 Viral infection, unspecified: Secondary | ICD-10-CM

## 2017-07-27 DIAGNOSIS — J040 Acute laryngitis: Secondary | ICD-10-CM | POA: Diagnosis not present

## 2017-07-27 LAB — CBC WITH DIFFERENTIAL/PLATELET
Basophils Absolute: 0 10*3/uL (ref 0.0–0.1)
Basophils Relative: 0 %
EOS ABS: 0 10*3/uL (ref 0.0–0.7)
EOS PCT: 0 %
HCT: 36 % (ref 36.0–46.0)
HEMOGLOBIN: 11.5 g/dL — AB (ref 12.0–15.0)
LYMPHS ABS: 0.6 10*3/uL — AB (ref 0.7–4.0)
LYMPHS PCT: 10 %
MCH: 29.3 pg (ref 26.0–34.0)
MCHC: 31.9 g/dL (ref 30.0–36.0)
MCV: 91.8 fL (ref 78.0–100.0)
MONOS PCT: 1 %
Monocytes Absolute: 0.1 10*3/uL (ref 0.1–1.0)
NEUTROS PCT: 89 %
Neutro Abs: 5.7 10*3/uL (ref 1.7–7.7)
Platelets: 240 10*3/uL (ref 150–400)
RBC: 3.92 MIL/uL (ref 3.87–5.11)
RDW: 13.6 % (ref 11.5–15.5)
WBC: 6.4 10*3/uL (ref 4.0–10.5)

## 2017-07-27 LAB — BASIC METABOLIC PANEL
Anion gap: 9 (ref 5–15)
BUN: 11 mg/dL (ref 6–20)
CALCIUM: 8.6 mg/dL — AB (ref 8.9–10.3)
CHLORIDE: 106 mmol/L (ref 101–111)
CO2: 22 mmol/L (ref 22–32)
CREATININE: 0.61 mg/dL (ref 0.44–1.00)
GFR calc non Af Amer: >60 mL/min (ref >60)
Glucose, Bld: 141 mg/dL — ABNORMAL HIGH (ref 65–99)
Potassium: 3.5 mmol/L (ref 3.5–5.1)
Sodium: 137 mmol/L (ref 135–145)

## 2017-07-27 MED ORDER — PREDNISONE 20 MG PO TABS: ORAL_TABLET | ORAL | 0 refills | Status: DC

## 2017-07-27 MED ORDER — ALBUTEROL SULFATE (2.5 MG/3ML) 0.083% IN NEBU
5.0000 mg | INHALATION_SOLUTION | Freq: Once | RESPIRATORY_TRACT | Status: AC
  Administered 2017-07-27: 5 mg via RESPIRATORY_TRACT
  Filled 2017-07-27: qty 6

## 2017-07-27 MED ORDER — ALBUTEROL SULFATE (2.5 MG/3ML) 0.083% IN NEBU
5.0000 mg | INHALATION_SOLUTION | Freq: Once | RESPIRATORY_TRACT | Status: DC
  Filled 2017-07-27: qty 6

## 2017-07-27 MED ORDER — ALBUTEROL SULFATE HFA 108 (90 BASE) MCG/ACT IN AERS: 1.0000 | INHALATION_SPRAY | Freq: Four times a day (QID) | RESPIRATORY_TRACT | 0 refills | Status: DC | PRN

## 2017-07-27 MED ORDER — DEXAMETHASONE SODIUM PHOSPHATE 10 MG/ML IJ SOLN
10.0000 mg | Freq: Once | INTRAMUSCULAR | Status: AC
  Administered 2017-07-27: 10 mg via INTRAMUSCULAR
  Filled 2017-07-27: qty 1

## 2017-07-27 MED ORDER — LORATADINE 10 MG PO TABS
10.0000 mg | ORAL_TABLET | Freq: Once | ORAL | Status: AC
  Administered 2017-07-27: 10 mg via ORAL
  Filled 2017-07-27: qty 1

## 2017-07-27 MED ORDER — SUCRALFATE 1 GM/10ML PO SUSP: 1.0000 g | Freq: Three times a day (TID) | ORAL | 0 refills | Status: DC

## 2017-07-27 MED ORDER — GUAIFENESIN 100 MG/5ML PO SYRP: 100.0000 mg | ORAL_SOLUTION | ORAL | 0 refills | Status: DC | PRN

## 2017-07-27 MED ORDER — IPRATROPIUM BROMIDE 0.02 % IN SOLN
0.5000 mg | Freq: Once | RESPIRATORY_TRACT | Status: AC
  Administered 2017-07-27: 0.5 mg via RESPIRATORY_TRACT
  Filled 2017-07-27: qty 2.5

## 2017-07-27 MED ORDER — ALBUTEROL SULFATE (2.5 MG/3ML) 0.083% IN NEBU
15.0000 mg | INHALATION_SOLUTION | Freq: Once | RESPIRATORY_TRACT | Status: AC
  Administered 2017-07-27: 15 mg via RESPIRATORY_TRACT

## 2017-07-27 MED ORDER — PREDNISONE 20 MG PO TABS
40.0000 mg | ORAL_TABLET | Freq: Once | ORAL | Status: AC
  Administered 2017-07-27: 40 mg via ORAL
  Filled 2017-07-27: qty 2

## 2017-07-27 NOTE — ED Notes (Signed)
Bed: [id number] Expected date:  Expected time:  Means of arrival:  Comments: RM5

## 2017-07-27 NOTE — ED Notes (Signed)
Pt statws she understands instructions. Home stable with steady gait.

## 2017-07-27 NOTE — ED Notes (Signed)
Called respiratory to bedside. Patient refused strep swab.

## 2017-07-27 NOTE — ED Provider Notes (Signed)
[facility] Note   CSN: [id number] Arrival date & time: 07/27/17  [id number]     History   Chief Complaint Chief Complaint  Patient presents with  . Asthma    HPI Wendy James is a 46 y.o. female.  HPI  46 y.o. Female with uri symptoms for 2 days.  [facility] and treated with albuterol x3 and dexamethasone.  She presents here with increaesed wheezing and dyspnea.  Review of prior ed chart reveals cxr with no active disease and unremarkable soft tissues of the neck. EKG with nsr and no acute changes.     Past Medical History:  Diagnosis Date  . 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 Asthma   . Seasonal allergies     Patient Active Problem List   Diagnosis Date Noted  . Influenza A with respiratory manifestations 09/06/2016  . Prediabetes 05/08/2015  . Essential hypertension 05/08/2015  . Mild intermittent asthma 05/06/2015  . Leukocytosis, steroid induced  04/29/2015  . Steroid-induced hyperglycemia 04/29/2015  . Asthma exacerbation 04/29/2015  . Accelerated hypertension 04/28/2015  . Anemia of chroic diseae, IDA 11/17/2013  . Bronchitis, acute 04/12/2012  . Acute asthma exacerbation 03/12/2012  . Acute respiratory failure (HCC) 03/12/2012    Past Surgical History:  Procedure Laterality Date  . CESAREAN SECTION     x 4  . CHOLECYSTECTOMY    . HYSTEROSCOPY W/D&C N/A 02/18/2015   Procedure: DILATATION AND CURETTAGE /HYSTEROSCOPY;  Surgeon: Allie BossierMyra C Dove, MD;  Location: [facility];  Service: Gynecology;  Laterality: N/A;  . TUBAL LIGATION      OB History    Gravida Para Term Preterm AB Living   5 4 4  0 1 3   SAB TAB Ectopic Multiple Live Births   1 0 0 0         Home Medications    Prior to Admission medications   Medication Sig Start Date End Date Taking? Authorizing Provider  acetaminophen (TYLENOL) 500 MG tablet Take 1,000 mg by mouth every 6 (six) hours as needed for mild pain, moderate pain or  headache.    [provider]  albuterol (PROVENTIL HFA;VENTOLIN HFA) 108 (90 Base) MCG/ACT inhaler Inhale 1-2 puffs into the lungs every 6 (six) hours as needed for wheezing or shortness of breath. 07/27/17   Palumbo, April, MD  albuterol (VENTOLIN HFA) 108 (90 Base) MCG/ACT inhaler INHALE 2 PUFFS INTO THE LUNGS EVERY 4 HOURS AS NEEDED FOR WHEEZING OR SHORTNESS OF BREATH 07/16/17   Massie MaroonHollis, Lachina M, FNP  amLODipine (NORVASC) 5 MG tablet Take 1 tablet (5 mg total) by mouth daily. 09/10/16   Calvert Cantorizwan, Saima, MD  budesonide-formoterol (SYMBICORT) 80-4.5 MCG/ACT inhaler Inhale 2 puffs into the lungs 2 (two) times daily. 05/02/17   Massie MaroonHollis, Lachina M, FNP  cetirizine (ZYRTEC) 10 MG tablet Take 1 tablet (10 mg total) by mouth daily. Patient not taking: Reported on 07/27/2017 05/02/17   Massie MaroonHollis, Lachina M, FNP  linaclotide Karlene Einstein(LINZESS) 72 MCG capsule Take 1 capsule (72 mcg total) by mouth daily before breakfast. Patient not taking: Reported on 07/27/2017 05/02/17   Massie MaroonHollis, Lachina M, FNP  montelukast (SINGULAIR) 10 MG tablet Take 1 tablet (10 mg total) by mouth at bedtime. Patient not taking: Reported on 07/27/2017 05/02/17   Massie MaroonHollis, Lachina M, FNP  predniSONE (DELTASONE) 20 MG tablet 3 tabs po day one, then 2 po daily x 4 days 07/27/17   Nicanor AlconPalumbo, April, MD  sucralfate (  CARAFATE) 1 GM/10ML suspension Take 10 mLs (1 g total) by mouth 4 (four) times daily -  with meals and at bedtime. 07/27/17   Palumbo, April, MD    Family History Family History  Problem Relation Age of Onset  . Hypertension Mother   . Stroke Sister   . Seizures Sister     Social History Social History   Tobacco Use  . Smoking status: Former Smoker    Packs/day: 0.75    Types: Cigarettes    Last attempt to quit: 11/17/1988    Years since quitting: 28.7  . Smokeless tobacco: Never Used  Substance Use Topics  . Alcohol use: No  . Drug use: No     Allergies   Mobic [meloxicam] and Shrimp [shellfish allergy]   Review of  Systems Review of Systems  All other systems reviewed and are negative.    Physical Exam Updated Vital Signs BP (!) 148/95 (BP Location: Right Arm)   Pulse 80   Temp 98.9 F (37.2 C) (Oral)   Resp 20   Ht 1.575 m (5\' 2" )   Wt 95.3 kg (210 lb)   SpO2 100%   BMI 38.41 kg/m   Physical Exam  Constitutional: She is oriented to person, place, and time. She appears well-developed and well-nourished. She appears distressed.  HENT:  Head: Normocephalic and atraumatic.  Right Ear: External ear normal.  Left Ear: External ear normal.  Nose: Nose normal.  Mouth/Throat: Oropharynx is clear and moist.  Eyes: Pupils are equal, round, and reactive to light.  Neck: Normal range of motion.  Cardiovascular: Normal rate, regular rhythm and normal heart sounds.  Pulmonary/Chest: Accessory muscle usage present. Tachypnea noted. She has wheezes.  Abdominal: Soft. Bowel sounds are normal.  Musculoskeletal: Normal range of motion.  Neurological: She is alert and oriented to person, place, and time.  Skin: Skin is warm and dry. Capillary refill takes less than 2 seconds.  Psychiatric: She has a normal mood and affect.  Nursing note and vitals reviewed.    ED Treatments / Results  Labs (all labs ordered are listed, but only abnormal results are displayed) Labs Reviewed  RESPIRATORY PANEL BY PCR  CBC WITH DIFFERENTIAL/PLATELET  BASIC METABOLIC PANEL    EKG  EKG Interpretation None       Radiology Dg Neck Soft Tissue  Result Date: 07/27/2017 CLINICAL DATA:  Acute onset of sore throat. EXAM: NECK SOFT TISSUES - 1+ VIEW COMPARISON:  Radiographs of the soft tissues of the neck performed 04/10/2012 FINDINGS: The nasopharynx, oropharynx and hypopharynx are unremarkable. The epiglottis is normal in thickness. The proximal trachea is within normal limits. The prevertebral soft tissues are unremarkable. Anterior osteophytes are seen along the lower cervical spine. No acute osseous  abnormalities are seen. The visualized paranasal sinuses and mastoid air cells are well-aerated. The visualized lung apices are clear. IMPRESSION: Unremarkable radiographs of the soft tissues of the neck. Electronically Signed   By: Roanna Raider M.D.   On: 07/27/2017 06:39   Dg Chest 2 View  Result Date: 07/27/2017 CLINICAL DATA:  46 year old female with shortness of breath and cough and wheezing. EXAM: CHEST  2 VIEW COMPARISON:  Chest radiograph dated 09/06/2016 FINDINGS: shallow inspiration. No focal consolidation, pleural effusion, or pneumothorax. Cardiac silhouette is within normal limits. No acute osseous pathology. IMPRESSION: No active cardiopulmonary disease. Electronically Signed   By: Elgie Collard M.D.   On: 07/27/2017 03:22    Procedures Procedures (including critical care time)  Medications Ordered in  ED Medications  albuterol (PROVENTIL) (2.5 MG/3ML) 0.083% nebulizer solution 5 mg (5 mg Nebulization Not Given 07/27/17 0855)  predniSONE (DELTASONE) tablet 40 mg (not administered)  albuterol (PROVENTIL) (2.5 MG/3ML) 0.083% nebulizer solution 15 mg (15 mg Nebulization Given 07/27/17 0901)     Initial Impression / Assessment and Plan / ED Course  I have reviewed the triage vital signs and the nursing notes.  Pertinent labs & imaging results that were available during my care of the patient were reviewed by me and considered in my medical decision making (see chart for details).     Patient with diffuse wheezing and appears dyspneic.  Albuterol neb 15 mg and prednisone 40 mg given.  12:28 PM .  Lungs are clear now with most noises appear to be emanating from the upper airway Patient with normal respiratory effort and clear lung sounds.  She feels improved.  Plan work note and cough medicine.  Final Clinical Impressions(s) / ED Diagnoses   Final diagnoses:  Mild intermittent asthma with exacerbation  Viral syndrome    ED Discharge Orders        Ordered     guaifenesin (ROBITUSSIN) 100 MG/5ML syrup  Every 4 hours PRN     07/27/17 1316       Margarita Grizzleay, Jhayla Podgorski, MD 07/27/17 1316

## 2017-07-27 NOTE — ED Notes (Addendum)
Pt presents with sob, cough with clear sputum, and audible wheezes. She states she has a machine at home, but it isn't working.

## 2017-07-27 NOTE — ED Notes (Signed)
Bed: WA05 Expected date:  Expected time:  Means of arrival:  Comments: 

## 2017-07-27 NOTE — ED Notes (Signed)
Pt presents for evaluation of ongoing wheezing. [facility] Pt received 2 nebs and steroids with no improvement.

## 2017-07-27 NOTE — ED Triage Notes (Signed)
Patient complaining of sore throat and sob. Patient states she has a hx of asthma. Patient has almost lost her voice.

## 2017-07-27 NOTE — ED Provider Notes (Signed)
[facility]-EMERGENCY DEPT Provider Note   CSN: [id number] Arrival date & time: 07/27/17  [id number]     History   Chief Complaint Chief Complaint  Patient presents with  . Shortness of Breath  . Sore Throat    HPI Wendy James is a 46 y.o. female.  The history is provided by the patient.  Sore Throat  This is a new problem. The current episode started 2 days ago. The problem occurs constantly. The problem has not changed since onset.Pertinent negatives include no chest pain, no abdominal pain and no headaches.  Wheezing   This is a recurrent problem. The current episode started yesterday. The problem occurs constantly. The problem has not changed since onset.Associated symptoms include sore throat. Pertinent negatives include no chest pain, no fever, no abdominal pain, no vomiting, no diarrhea, no dysuria, no coryza, no ear pain, no headaches, no rhinorrhea, no swollen glands, no neck pain, no cough, no hemoptysis, no sputum production and no rash. There was no precipitant for this problem. She has tried nothing for the symptoms. The treatment provided no relief. She has had prior ED visits. She has had no prior ICU admissions. Her past medical history is significant for asthma.    Past Medical History:  Diagnosis Date  . 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 Asthma   . Seasonal allergies     Patient Active Problem List   Diagnosis Date Noted  . Influenza A with respiratory manifestations 09/06/2016  . Prediabetes 05/08/2015  . Essential hypertension 05/08/2015  . Mild intermittent asthma 05/06/2015  . Leukocytosis, steroid induced  04/29/2015  . Steroid-induced hyperglycemia 04/29/2015  . Asthma exacerbation 04/29/2015  . Accelerated hypertension 04/28/2015  . Anemia of chroic diseae, IDA 11/17/2013  . Bronchitis, acute 04/12/2012  . Acute asthma exacerbation 03/12/2012  . Acute respiratory failure (HCC) 03/12/2012    Past Surgical History:    Procedure Laterality Date  . CESAREAN SECTION     x 4  . CHOLECYSTECTOMY    . HYSTEROSCOPY W/D&C N/A 02/18/2015   Procedure: DILATATION AND CURETTAGE /HYSTEROSCOPY;  Surgeon: Allie BossierMyra C Dove, MD;  Location: [facility];  Service: Gynecology;  Laterality: N/A;  . TUBAL LIGATION      OB History    Gravida Para Term Preterm AB Living   5 4 4  0 1 3   SAB TAB Ectopic Multiple Live Births   1 0 0 0         Home Medications    Prior to Admission medications   Medication Sig Start Date End Date Taking? Authorizing Provider  acetaminophen (TYLENOL) 500 MG tablet Take 1,000 mg by mouth every 6 (six) hours as needed for mild pain, moderate pain or headache.   Yes [provider]  albuterol (VENTOLIN HFA) 108 (90 Base) MCG/ACT inhaler INHALE 2 PUFFS INTO THE LUNGS EVERY 4 HOURS AS NEEDED FOR WHEEZING OR SHORTNESS OF BREATH 07/16/17  Yes Massie MaroonHollis, Lachina M, FNP  amLODipine (NORVASC) 5 MG tablet Take 1 tablet (5 mg total) by mouth daily. 09/10/16  Yes Calvert Cantorizwan, Saima, MD  budesonide-formoterol (SYMBICORT) 80-4.5 MCG/ACT inhaler Inhale 2 puffs into the lungs 2 (two) times daily. 05/02/17  Yes Massie MaroonHollis, Lachina M, FNP  cetirizine (ZYRTEC) 10 MG tablet Take 1 tablet (10 mg total) by mouth daily. Patient not taking: Reported on 07/27/2017 05/02/17   Massie MaroonHollis, Lachina M, FNP  linaclotide [facility](LINZESS) 72 MCG capsule Take 1 capsule (72 mcg total) by mouth daily before breakfast.  Patient not taking: Reported on 07/27/2017 05/02/17   Massie MaroonHollis, Lachina M, FNP  montelukast (SINGULAIR) 10 MG tablet Take 1 tablet (10 mg total) by mouth at bedtime. Patient not taking: Reported on 07/27/2017 05/02/17   Massie MaroonHollis, Lachina M, FNP    Family History Family History  Problem Relation Age of Onset  . Hypertension Mother   . Stroke Sister   . Seizures Sister     Social History Social History   Tobacco Use  . Smoking status: Former Smoker    Packs/day: 0.75    Types: Cigarettes    Last attempt to quit: 11/17/1988    Years  since quitting: 28.7  . Smokeless tobacco: Never Used  Substance Use Topics  . Alcohol use: No  . Drug use: No     Allergies   Mobic [meloxicam] and Shrimp [shellfish allergy]   Review of Systems Review of Systems  Constitutional: Negative for fever.  HENT: Positive for sore throat and voice change. Negative for ear pain, rhinorrhea and trouble swallowing.   Respiratory: Positive for wheezing. Negative for cough, hemoptysis and sputum production.   Cardiovascular: Negative for chest pain.  Gastrointestinal: Negative for abdominal pain, diarrhea and vomiting.  Genitourinary: Negative for dysuria.  Musculoskeletal: Negative for neck pain.  Skin: Negative for rash.  Neurological: Negative for headaches.  All other systems reviewed and are negative.    Physical Exam Updated Vital Signs BP 135/82 (BP Location: Left Arm)   Pulse 86   Temp 98.1 F (36.7 C) (Oral)   Resp 20   Ht 5\' 6"  (1.676 m)   Wt 95.7 kg (211 lb)   SpO2 99%   BMI 34.06 kg/m   Physical Exam  Constitutional: She is oriented to person, place, and time. She appears well-developed and well-nourished. No distress.  HENT:  Head: Normocephalic and atraumatic.  Nose: Nose normal.  Mouth/Throat: Oropharynx is clear and moist. No oropharyngeal exudate.  Eyes: Conjunctivae are normal. Pupils are equal, round, and reactive to light.  Neck: Normal range of motion. Neck supple. No JVD present. No tracheal deviation present.  No pain with displacement of the trachea  Cardiovascular: Normal rate, regular rhythm, normal heart sounds and intact distal pulses.  Pulmonary/Chest: Effort normal. No stridor. No respiratory distress. She has wheezes. She has no rales. She exhibits no tenderness.  Abdominal: Soft. Bowel sounds are normal. She exhibits no mass. There is no tenderness. There is no rebound and no guarding.  Musculoskeletal: Normal range of motion.  Lymphadenopathy:    She has no cervical adenopathy.    Neurological: She is alert and oriented to person, place, and time.  Skin: Skin is warm and dry. Capillary refill takes less than 2 seconds.  Psychiatric: She has a normal mood and affect.     ED Treatments / Results  Labs (all labs ordered are listed, but only abnormal results are displayed) Labs Reviewed - No data to display  EKG  EKG Interpretation None       Radiology Results for orders placed or performed in visit on 05/02/17  BASIC METABOLIC PANEL WITH GFR  Result Value Ref Range   Glucose, Bld 99 65 - 99 mg/dL   BUN 10 7 - 25 mg/dL   Creat 1.610.65 0.960.50 - 0.451.10 mg/dL   GFR, Est Non African American 105 > OR = 60 mL/min/1.7073m2   GFR, Est African American 122 > OR = 60 mL/min/1.2473m2   BUN/Creatinine Ratio NOT APPLICABLE 6 - 22 (calc)   Sodium 136 135 -  146 mmol/L   Potassium 4.0 3.5 - 5.3 mmol/L   Chloride 103 98 - 110 mmol/L   CO2 25 20 - 32 mmol/L   Calcium 8.5 (L) 8.6 - 10.2 mg/dL  POCT urinalysis dip (device)  Result Value Ref Range   Glucose, UA NEGATIVE NEGATIVE mg/dL   Bilirubin Urine NEGATIVE NEGATIVE   Ketones, ur NEGATIVE NEGATIVE mg/dL   Specific Gravity, Urine 1.015 1.005 - 1.030   Hgb urine dipstick NEGATIVE NEGATIVE   pH 6.0 5.0 - 8.0   Protein, ur NEGATIVE NEGATIVE mg/dL   Urobilinogen, UA 0.2 0.0 - 1.0 mg/dL   Nitrite NEGATIVE NEGATIVE   Leukocytes, UA TRACE (A) NEGATIVE   Dg Neck Soft Tissue  Result Date: 07/27/2017 CLINICAL DATA:  Acute onset of sore throat. EXAM: NECK SOFT TISSUES - 1+ VIEW COMPARISON:  Radiographs of the soft tissues of the neck performed 04/10/2012 FINDINGS: The nasopharynx, oropharynx and hypopharynx are unremarkable. The epiglottis is normal in thickness. The proximal trachea is within normal limits. The prevertebral soft tissues are unremarkable. Anterior osteophytes are seen along the lower cervical spine. No acute osseous abnormalities are seen. The visualized paranasal sinuses and mastoid air cells are well-aerated. The  visualized lung apices are clear. IMPRESSION: Unremarkable radiographs of the soft tissues of the neck. Electronically Signed   By: Roanna Raider M.D.   On: 07/27/2017 06:39   Dg Chest 2 View  Result Date: 07/27/2017 CLINICAL DATA:  46 year old female with shortness of breath and cough and wheezing. EXAM: CHEST  2 VIEW COMPARISON:  Chest radiograph dated 09/06/2016 FINDINGS: shallow inspiration. No focal consolidation, pleural effusion, or pneumothorax. Cardiac silhouette is within normal limits. No acute osseous pathology. IMPRESSION: No active cardiopulmonary disease. Electronically Signed   By: Elgie Collard M.D.   On: 07/27/2017 03:22    Procedures Procedures (including critical care time)  Medications Ordered in ED Medications  albuterol (PROVENTIL) (2.5 MG/3ML) 0.083% nebulizer solution 5 mg (5 mg Nebulization Given 07/27/17 0325)  albuterol (PROVENTIL) (2.5 MG/3ML) 0.083% nebulizer solution 5 mg (5 mg Nebulization Given 07/27/17 0404)  dexamethasone (DECADRON) injection 10 mg (10 mg Intramuscular Given 07/27/17 0629)  albuterol (PROVENTIL) (2.5 MG/3ML) 0.083% nebulizer solution 5 mg (5 mg Nebulization Given 07/27/17 0545)  ipratropium (ATROVENT) nebulizer solution 0.5 mg (0.5 mg Nebulization Given 07/27/17 0545)  loratadine (CLARITIN) tablet 10 mg (10 mg Oral Given 07/27/17 1610)       Final Clinical Impressions(s) / ED Diagnoses   Return for fevers > 100.4 unrelieved by medication, weakness or numbness, stiff neck, intractable vomiting, or diarrhea, abdominal pain, Inability to tolerate liquids or food, cough, altered mental status or any concerns. No signs of systemic illness or infection. The patient is nontoxic-appearing on exam and vital signs are within normal limits.    I have reviewed the triage vital signs and the nursing notes. Pertinent labs &imaging results that were available during my care of the patient were reviewed by me and considered in my medical decision  making (see chart for details).  After history, exam, and medical workup I feel the patient has been appropriately medically screened and is safe for discharge home. Pertinent diagnoses were discussed with the patient. Patient was given return precautions      Idania Desouza, MD 07/27/17 [phone]

## 2017-07-29 MED FILL — !VENTOLIN HFA INHALER: 108 (90 BAS | 16 days supply | Qty: 18 | Fill #0

## 2017-08-02 ENCOUNTER — Ambulatory Visit: Payer: BLUE CROSS/BLUE SHIELD | Admitting: Family Medicine

## 2017-08-05 ENCOUNTER — Ambulatory Visit (INDEPENDENT_AMBULATORY_CARE_PROVIDER_SITE_OTHER): Payer: BLUE CROSS/BLUE SHIELD | Admitting: Family Medicine

## 2017-08-05 ENCOUNTER — Encounter: Payer: Self-pay | Admitting: Family Medicine

## 2017-08-05 VITALS — BP 127/80 | HR 65 | Temp 98.6°F | Resp 16 | Ht 62.0 in | Wt 217.0 lb

## 2017-08-05 DIAGNOSIS — I10 Essential (primary) hypertension: Secondary | ICD-10-CM

## 2017-08-05 DIAGNOSIS — J4520 Mild intermittent asthma, uncomplicated: Secondary | ICD-10-CM | POA: Diagnosis not present

## 2017-08-05 LAB — POCT URINALYSIS DIP (DEVICE)
Bilirubin Urine: NEGATIVE
GLUCOSE, UA: NEGATIVE mg/dL
Hgb urine dipstick: NEGATIVE
Ketones, ur: NEGATIVE mg/dL
LEUKOCYTES UA: NEGATIVE
NITRITE: NEGATIVE
Protein, ur: NEGATIVE mg/dL
SPECIFIC GRAVITY, URINE: 1.02 (ref 1.005–1.030)
UROBILINOGEN UA: 0.2 mg/dL (ref 0.0–1.0)
pH: 7 (ref 5.0–8.0)

## 2017-08-05 MED ORDER — AMLODIPINE BESYLATE 5 MG PO TABS: 5.0000 mg | ORAL_TABLET | Freq: Every day | ORAL | 5 refills | Status: DC

## 2017-08-05 MED ORDER — FLUTICASONE-SALMETEROL 100-50 MCG/DOSE IN AEPB: 1.0000 | INHALATION_SPRAY | Freq: Two times a day (BID) | RESPIRATORY_TRACT | 3 refills | Status: DC

## 2017-08-05 MED ORDER — ALBUTEROL SULFATE HFA 108 (90 BASE) MCG/ACT IN AERS: 1.0000 | INHALATION_SPRAY | Freq: Four times a day (QID) | RESPIRATORY_TRACT | 5 refills | Status: DC | PRN

## 2017-08-05 MED ORDER — MONTELUKAST SODIUM 10 MG PO TABS: 10.0000 mg | ORAL_TABLET | Freq: Every day | ORAL | 11 refills | Status: DC

## 2017-08-05 MED ORDER — BUDESONIDE-FORMOTEROL FUMARATE 80-4.5 MCG/ACT IN AERO: 2.0000 | INHALATION_SPRAY | Freq: Two times a day (BID) | RESPIRATORY_TRACT | 5 refills | Status: DC

## 2017-08-05 NOTE — Patient Instructions (Addendum)
DASH Eating Plan DASH stands for "Dietary Approaches to Stop Hypertension." The DASH eating plan is a healthy eating plan that has been shown to reduce high blood pressure (hypertension). It may also reduce your risk for type 2 diabetes, heart disease, and stroke. The DASH eating plan may also help with weight loss. What are tips for following this plan? General guidelines  Avoid eating more than 2,300 mg (milligrams) of salt (sodium) a day. If you have hypertension, you may need to reduce your sodium intake to 1,500 mg a day.  Limit alcohol intake to no more than 1 drink a day for nonpregnant women and 2 drinks a day for men. One drink equals 12 oz of beer, 5 oz of wine, or 1 oz of hard liquor.  Work with your health care provider to maintain a healthy body weight or to lose weight. Ask what an ideal weight is for you.  Get at least 30 minutes of exercise that causes your heart to beat faster (aerobic exercise) most days of the week. Activities may include walking, swimming, or biking.  Work with your health care provider or diet and nutrition specialist (dietitian) to adjust your eating plan to your individual calorie needs. Reading food labels  Check food labels for the amount of sodium per serving. Choose foods with less than 5 percent of the Daily Value of sodium. Generally, foods with less than 300 mg of sodium per serving fit into this eating plan.  To find whole grains, look for the word "whole" as the first word in the ingredient list. Shopping  Buy products labeled as "low-sodium" or "no salt added."  Buy fresh foods. Avoid canned foods and premade or frozen meals. Cooking  Avoid adding salt when cooking. Use salt-free seasonings or herbs instead of table salt or sea salt. Check with your health care provider or pharmacist before using salt substitutes.  Do not fry foods. Cook foods using healthy methods such as baking, boiling, grilling, and broiling instead.  Cook with  heart-healthy oils, such as olive, canola, soybean, or sunflower oil. Meal planning   Eat a balanced diet that includes: ? 5 or more servings of fruits and vegetables each day. At each meal, try to fill half of your plate with fruits and vegetables. ? Up to 6-8 servings of whole grains each day. ? Less than 6 oz of lean meat, poultry, or fish each day. A 3-oz serving of meat is about the same size as a deck of cards. One egg equals 1 oz. ? 2 servings of low-fat dairy each day. ? A serving of nuts, seeds, or beans 5 times each week. ? Heart-healthy fats. Healthy fats called Omega-3 fatty acids are found in foods such as flaxseeds and coldwater fish, like sardines, salmon, and mackerel.  Limit how much you eat of the following: ? Canned or prepackaged foods. ? Food that is high in trans fat, such as fried foods. ? Food that is high in saturated fat, such as fatty meat. ? Sweets, desserts, sugary drinks, and other foods with added sugar. ? Full-fat dairy products.  Do not salt foods before eating.  Try to eat at least 2 vegetarian meals each week.  Eat more home-cooked food and less restaurant, buffet, and fast food.  When eating at a restaurant, ask that your food be prepared with less salt or no salt, if possible. What foods are recommended? The items listed may not be a complete list. Talk with your dietitian about what   dietary choices are best for you. Grains Whole-grain or whole-wheat bread. Whole-grain or whole-wheat pasta. Brown rice. Oatmeal. Quinoa. Bulgur. Whole-grain and low-sodium cereals. Pita bread. Low-fat, low-sodium crackers. Whole-wheat flour tortillas. Vegetables Fresh or frozen vegetables (raw, steamed, roasted, or grilled). Low-sodium or reduced-sodium tomato and vegetable juice. Low-sodium or reduced-sodium tomato sauce and tomato paste. Low-sodium or reduced-sodium canned vegetables. Fruits All fresh, dried, or frozen fruit. Canned fruit in natural juice (without  added sugar). Meat and other protein foods Skinless chicken or turkey. Ground chicken or turkey. Pork with fat trimmed off. Fish and seafood. Egg whites. Dried beans, peas, or lentils. Unsalted nuts, nut butters, and seeds. Unsalted canned beans. Lean cuts of beef with fat trimmed off. Low-sodium, lean deli meat. Dairy Low-fat (1%) or fat-free (skim) milk. Fat-free, low-fat, or reduced-fat cheeses. Nonfat, low-sodium ricotta or cottage cheese. Low-fat or nonfat yogurt. Low-fat, low-sodium cheese. Fats and oils Soft margarine without trans fats. Vegetable oil. Low-fat, reduced-fat, or light mayonnaise and salad dressings (reduced-sodium). Canola, safflower, olive, soybean, and sunflower oils. Avocado. Seasoning and other foods Herbs. Spices. Seasoning mixes without salt. Unsalted popcorn and pretzels. Fat-free sweets. What foods are not recommended? The items listed may not be a complete list. Talk with your dietitian about what dietary choices are best for you. Grains Baked goods made with fat, such as croissants, muffins, or some breads. Dry pasta or rice meal packs. Vegetables Creamed or fried vegetables. Vegetables in a cheese sauce. Regular canned vegetables (not low-sodium or reduced-sodium). Regular canned tomato sauce and paste (not low-sodium or reduced-sodium). Regular tomato and vegetable juice (not low-sodium or reduced-sodium). Pickles. Olives. Fruits Canned fruit in a light or heavy syrup. Fried fruit. Fruit in cream or butter sauce. Meat and other protein foods Fatty cuts of meat. Ribs. Fried meat. Bacon. Sausage. Bologna and other processed lunch meats. Salami. Fatback. Hotdogs. Bratwurst. Salted nuts and seeds. Canned beans with added salt. Canned or smoked fish. Whole eggs or egg yolks. Chicken or turkey with skin. Dairy Whole or 2% milk, cream, and half-and-half. Whole or full-fat cream cheese. Whole-fat or sweetened yogurt. Full-fat cheese. Nondairy creamers. Whipped toppings.  Processed cheese and cheese spreads. Fats and oils Butter. Stick margarine. Lard. Shortening. Ghee. Bacon fat. Tropical oils, such as coconut, palm kernel, or palm oil. Seasoning and other foods Salted popcorn and pretzels. Onion salt, garlic salt, seasoned salt, table salt, and sea salt. Worcestershire sauce. Tartar sauce. Barbecue sauce. Teriyaki sauce. Soy sauce, including reduced-sodium. Steak sauce. Canned and packaged gravies. Fish sauce. Oyster sauce. Cocktail sauce. Horseradish that you find on the shelf. Ketchup. Mustard. Meat flavorings and tenderizers. Bouillon cubes. Hot sauce and Tabasco sauce. Premade or packaged marinades. Premade or packaged taco seasonings. Relishes. Regular salad dressings. Where to find more information:  National Heart, Lung, and Blood Institute: www.nhlbi.nih.gov  American Heart Association: www.heart.org Summary  The DASH eating plan is a healthy eating plan that has been shown to reduce high blood pressure (hypertension). It may also reduce your risk for type 2 diabetes, heart disease, and stroke.  With the DASH eating plan, you should limit salt (sodium) intake to 2,300 mg a day. If you have hypertension, you may need to reduce your sodium intake to 1,500 mg a day.  When on the DASH eating plan, aim to eat more fresh fruits and vegetables, whole grains, lean proteins, low-fat dairy, and heart-healthy fats.  Work with your health care provider or diet and nutrition specialist (dietitian) to adjust your eating plan to your individual   calorie needs. This information is not intended to replace advice given to you by your health care provider. Make sure you discuss any questions you have with your health care provider. Document Released: 07/05/2011 Document Revised: 07/09/2016 Document Reviewed: 07/09/2016 Elsevier Interactive Patient Education  2018 [employer].   Asthma, Adult Asthma is a condition of the lungs in which the airways tighten and narrow.  Asthma can make it hard to breathe. Asthma cannot be cured, but medicine and lifestyle changes can help control it. Asthma may be started (triggered) by:  Animal skin flakes (dander).  Dust.  Cockroaches.  Pollen.  Mold.  Smoke.  Cleaning products.  Hair sprays or aerosol sprays.  Paint fumes or strong smells.  Cold air, weather changes, and winds.  Crying or laughing hard.  Stress.  Certain medicines or drugs.  Foods, such as dried fruit, potato chips, and sparkling grape juice.  Infections or conditions (colds, flu).  Exercise.  Certain medical conditions or diseases.  Exercise or tiring activities.  Follow these instructions at home:  Take medicine as told by your doctor.  Use a peak flow meter as told by your doctor. A peak flow meter is a tool that measures how well the lungs are working.  Record and keep track of the peak flow meter's readings.  Understand and use the asthma action plan. An asthma action plan is a written plan for taking care of your asthma and treating your attacks.  To help prevent asthma attacks: ? Do not smoke. Stay away from secondhand smoke. ? Change your heating and air conditioning filter often. ? Limit your use of fireplaces and wood stoves. ? Get rid of pests (such as roaches and mice) and their droppings. ? Throw away plants if you see mold on them. ? Clean your floors. Dust regularly. Use cleaning products that do not smell. ? Have someone vacuum when you are not home. Use a vacuum cleaner with a HEPA filter if possible. ? Replace carpet with wood, tile, or vinyl flooring. Carpet can trap animal skin flakes and dust. ? Use allergy-proof pillows, mattress covers, and box spring covers. ? Wash bed sheets and blankets every week in hot water and dry them in a dryer. ? Use blankets that are made of polyester or cotton. ? Clean bathrooms and kitchens with bleach. If possible, have someone repaint the walls in these rooms with  mold-resistant paint. Keep out of the rooms that are being cleaned and painted. ? Wash hands often. Contact a doctor if:  You have make a whistling sound when breaking (wheeze), have shortness of breath, or have a cough even if taking medicine to prevent attacks.  The colored mucus you cough up (sputum) is thicker than usual.  The colored mucus you cough up changes from clear or white to yellow, green, gray, or bloody.  You have problems from the medicine you are taking such as: ? A rash. ? Itching. ? Swelling. ? Trouble breathing.  You need reliever medicines more than 2-3 times a week.  Your peak flow measurement is still at 50-79% of your personal best after following the action plan for 1 hour.  You have a fever. Get help right away if:  You seem to be worse and are not responding to medicine during an asthma attack.  You are short of breath even at rest.  You get short of breath when doing very little activity.  You have trouble eating, drinking, or talking.  You have chest pain.  You have a fast heartbeat.  Your lips or fingernails start to turn blue.  You are light-headed, dizzy, or faint.  Your peak flow is less than 50% of your personal best. This information is not intended to replace advice given to you by your health care provider. Make sure you discuss any questions you have with your health care provider. Document Released: 01/02/2008 Document Revised: 12/22/2015 Document Reviewed: 02/12/2013 Elsevier Interactive Patient Education  2017 [employer]

## 2017-08-10 NOTE — Progress Notes (Signed)
Subjective:    Patient ID: Wendy James, female    DOB: 11/28/1970, 47 y.o.   MRN: [id number]  Wendy James, a 47 year old female presents for a follow up of asthma and hypertension. Wendy James was admitted to inpatient services on 12.29/2019 for an asthma exacerbation. Patient was discharged on oral steroids. She says that symptoms have improved since that time. She says that she has not been awakened by asthma symptoms and has not required albuterol inhaler.    Asthma  There is no chest tightness, cough, frequent throat clearing, hemoptysis, hoarse voice, shortness of breath, sputum production or wheezing. Primary symptoms comments: Patient was recently evaluated in the emergency department and has completed a course of steroids.. This is a recurrent problem. The problem occurs intermittently. The problem has been unchanged. Pertinent negatives include no chest pain, ear congestion, ear pain, fever, headaches, malaise/fatigue, PND, sneezing, sore throat, sweats or trouble swallowing. Her symptoms are aggravated by change in weather and pollen. Her symptoms are alleviated by beta-agonist. She reports minimal improvement on treatment. Her past medical history is significant for asthma.  Hypertension  This is a recurrent problem. The current episode started more than 1 month ago. The problem has been rapidly improving since onset. The problem is controlled. Pertinent negatives include no anxiety, blurred vision, chest pain, headaches, malaise/fatigue, neck pain, orthopnea, palpitations, peripheral edema, PND, shortness of breath or sweats. There are no associated agents to hypertension. Risk factors for coronary artery disease include obesity and sedentary lifestyle. Past treatments include calcium channel blockers. The current treatment provides moderate improvement. Compliance problems: Patient has been out of medication for greater than 1 week.    Past Medical History:  Diagnosis Date  .  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 Asthma   . Seasonal allergies    Social History   Socioeconomic History  . Marital status: Married    Spouse name: Not on file  . Number of children: Not on file  . Years of education: Not on file  . Highest education level: Not on file  Social Needs  . Financial resource strain: Not on file  . Food insecurity - worry: Not on file  . Food insecurity - inability: Not on file  . Transportation needs - medical: Not on file  . Transportation needs - non-medical: Not on file  Occupational History  . Not on file  Tobacco Use  . Smoking status: Former Smoker    Packs/day: 0.75    Types: Cigarettes    Last attempt to quit: 11/17/1988    Years since quitting: 28.7  . Smokeless tobacco: Never Used  Substance and Sexual Activity  . Alcohol use: No  . Drug use: No  . Sexual activity: Yes    Birth control/protection: Surgical  Other Topics Concern  . Not on file  Social History Narrative  . Not on file   Immunization History  Administered Date(s) Administered  . Influenza Split 04/11/2012  . Influenza,inj,Quad PF,6+ Mos 04/30/2015  . Pneumococcal Polysaccharide-23 04/30/2015   Review of Systems  Constitutional: Negative for fever, malaise/fatigue and unexpected weight change.  HENT: Negative for ear pain, hoarse voice, sneezing, sore throat and trouble swallowing.   Eyes: Negative.  Negative for blurred vision.  Respiratory: Negative for cough, hemoptysis, sputum production, shortness of breath and wheezing.   Cardiovascular: Negative.  Negative for chest pain, palpitations, orthopnea and PND.  Gastrointestinal: Positive for constipation.  Endocrine: Negative.   Genitourinary: Negative.  Musculoskeletal: Negative.  Negative for neck pain.  Skin: Negative.   Allergic/Immunologic: Negative.   Neurological: Negative.  Negative for headaches.  Hematological: Negative.   Psychiatric/Behavioral: Negative.  Negative for sleep disturbance and  suicidal ideas.       Objective:   Physical Exam  Constitutional: She is oriented to person, place, and time. She appears well-developed and well-nourished.  HENT:  Head: Normocephalic and atraumatic.  Right Ear: External ear normal.  Left Ear: External ear normal.  Mouth/Throat: Oropharynx is clear and moist.  Eyes: Conjunctivae and EOM are normal. Pupils are equal, round, and reactive to light.  Neck: Normal range of motion. Neck supple.  Cardiovascular: Normal rate, regular rhythm and intact distal pulses.  Pulmonary/Chest: Effort normal.  Abdominal: Soft. Bowel sounds are normal.  Musculoskeletal: Normal range of motion.  Neurological: She is alert and oriented to person, place, and time. She has normal reflexes.  Skin: Skin is warm and dry.  Psychiatric: She has a normal mood and affect. Her behavior is normal. Judgment and thought content normal.      BP 127/80 (BP Location: Left Arm, Patient Position: Sitting, Cuff Size: Large)   Pulse 65   Temp 98.6 F (37 C) (Oral)   Resp 16   Ht 5\' 2"  (1.575 m)   Wt 217 lb (98.4 kg)   LMP 07/13/2017   SpO2 100%   BMI 39.69 kg/m  Assessment & Plan:   Mild intermittent asthma, uncomplicated - albuterol (PROVENTIL HFA;VENTOLIN HFA) 108 (90 Base) MCG/ACT inhaler; Inhale 1-2 puffs into the lungs every 6 (six) hours as needed for wheezing or shortness of breath.  Dispense: 1 Inhaler; Refill: 5 - montelukast (SINGULAIR) 10 MG tablet; Take 1 tablet (10 mg total) by mouth at bedtime.  Dispense: 30 tablet; Refill: 11 - Fluticasone-Salmeterol (ADVAIR) 100-50 MCG/DOSE AEPB; Inhale 1 puff into the lungs 2 (two) times daily.  Dispense: 1 each; Refill: 3  2. Essential hypertension Blood pressure is at goal on current medication regimen Reviewed urinalysis, no proteinuria present  - amLODipine (NORVASC) 5 MG tablet; Take 1 tablet (5 mg total) by mouth daily.  Dispense: 30 tablet; Refill: 5   RTC; 6 months for chronic conditions   Nolon Nations  MSN, FNP-C Patient Care [facility] [address] [place], [place] [address] [phone]

## 2017-09-03 MED FILL — !VENTOLIN HFA INHALER: 108 (90 BAS | 25 days supply | Qty: 18 | Fill #0

## 2017-09-03 MED FILL — ?MONTELUKAST SOD 10 MG TAB: 10 | 30 days supply | Qty: 30 | Fill #0

## 2017-09-03 MED FILL — !ADVAIR 100/50 DISKUS: 100-50 | 30 days supply | Qty: 60 | Fill #0

## 2017-09-03 MED FILL — ?AMLODIPINE BESYLATE 5 MG T: 5 MG | 30 days supply | Qty: 30 | Fill #0

## 2017-09-19 ENCOUNTER — Ambulatory Visit: Payer: BLUE CROSS/BLUE SHIELD | Admitting: Obstetrics & Gynecology

## 2017-09-30 ENCOUNTER — Encounter: Payer: Self-pay | Admitting: Obstetrics & Gynecology

## 2017-09-30 ENCOUNTER — Ambulatory Visit: Payer: BLUE CROSS/BLUE SHIELD | Admitting: Obstetrics & Gynecology

## 2017-09-30 VITALS — BP 134/93 | HR 78 | Wt 216.0 lb

## 2017-09-30 DIAGNOSIS — R7303 Prediabetes: Secondary | ICD-10-CM

## 2017-09-30 DIAGNOSIS — Z124 Encounter for screening for malignant neoplasm of cervix: Secondary | ICD-10-CM

## 2017-09-30 DIAGNOSIS — Z01419 Encounter for gynecological examination (general) (routine) without abnormal findings: Secondary | ICD-10-CM | POA: Diagnosis not present

## 2017-09-30 DIAGNOSIS — Z1151 Encounter for screening for human papillomavirus (HPV): Secondary | ICD-10-CM

## 2017-09-30 MED ORDER — METRONIDAZOLE 500 MG PO TABS: 500.0000 mg | ORAL_TABLET | Freq: Two times a day (BID) | ORAL | 0 refills | Status: DC

## 2017-09-30 NOTE — Progress Notes (Signed)
Subjective:    Wendy James is a 47 y.o. divorced female who presents for an annual exam. The patient has no complaints today. The patient is not currently sexually active. GYN screening history: last pap: was normal. The patient wears seatbelts: yes. The patient participates in regular exercise: yes. Has the patient ever been transfused or tattooed?: no. The patient reports that there is not domestic violence in her life.   Menstrual History: OB History    Gravida Para Term Preterm AB Living   5 4 4  0 1 3   SAB TAB Ectopic Multiple Live Births   1 0 0 0        Menarche age: [age] Patient's last menstrual period was 06/10/2017 (approximate).    The following portions of the patient's history were reviewed and updated as appropriate: allergies, current medications, past family history, past medical history, past social history, past surgical history and problem list.  Review of Systems Pertinent items are noted in HPI.   Fh- breast/gyn cancer + colon cancer - maternal GM, maternal uncle, maternal aunts x 2   Objective:    BP (!) 134/93   Pulse 78   Wt 216 lb (98 kg)   LMP 06/10/2017 (Approximate)   BMI 39.51 kg/m   General Appearance:    Alert, cooperative, no distress, appears stated age  Head:    Normocephalic, without obvious abnormality, atraumatic  Eyes:    PERRL, conjunctiva/corneas clear, EOM's intact, fundi    benign, both eyes  Ears:    Normal TM's and external ear canals, both ears  Nose:   Nares normal, septum midline, mucosa normal, no drainage    or sinus tenderness  Throat:   Lips, mucosa, and tongue normal; teeth and gums normal  Neck:   Supple, symmetrical, trachea midline, no adenopathy;    thyroid:  no enlargement/tenderness/nodules; no carotid   bruit or JVD  Back:     Symmetric, no curvature, ROM normal, no CVA tenderness  Lungs:     Clear to auscultation bilaterally, respirations unlabored  Chest Wall:    No tenderness or deformity   Heart:     Regular rate and rhythm, S1 and S2 normal, no murmur, rub   or gallop  Breast Exam:    No tenderness, masses, or nipple abnormality  Abdomen:     Soft, non-tender, bowel sounds active all four quadrants,    no masses, no organomegaly  Genitalia:    Normal female without lesion, discharge or tenderness, mild atrophy, discharge c/w BV, no masses palpable with bimanual exam     Extremities:   Extremities normal, atraumatic, no cyanosis or edema  Pulses:   2+ and symmetric all extremities  Skin:   Skin color, texture, turgor normal, no rashes or lesions  Lymph nodes:   Cervical, supraclavicular, and axillary nodes normal  Neurologic:   CNII-XII intact, normal strength, sensation and reflexes    throughout  .    Assessment:    Healthy female exam.   Prediabetes   Plan:     Thin prep Pap smear. with cotesting Mammogram [facility] testing Refer to fam med Nutrition counseling Treat with flagyl Rec boric acid for a week q monthly

## 2017-10-01 LAB — CYTOLOGY - PAP
Diagnosis: NEGATIVE
HPV (WINDOPATH): NOT DETECTED

## 2017-10-01 LAB — HEMOGLOBIN A1C
Est. average glucose Bld gHb Est-mCnc: 120 mg/dL
Hgb A1c MFr Bld: 5.8 % — ABNORMAL HIGH (ref 4.8–5.6)

## 2017-10-21 ENCOUNTER — Ambulatory Visit: Payer: BLUE CROSS/BLUE SHIELD | Admitting: Registered"

## 2017-10-30 MED FILL — AMLODIPINE BESYLATE 5 MG TA: 5 | 30 days supply | Qty: 30 | Fill #1

## 2017-10-30 MED FILL — PROAIR HFA 90 MCG INHALER: 108 (90 BAS | 25 days supply | Qty: 9 | Fill #1

## 2017-11-01 MED FILL — SYMBICORT 80-4.5 MCG INH: 80-4.5 | 30 days supply | Qty: 10 | Fill #0

## 2017-12-16 MED FILL — SYMBICORT 80-4.5 MCG INH: 80-4.5 | 30 days supply | Qty: 10 | Fill #1

## 2017-12-16 MED FILL — ALBUTEROL SULFATE HFA 108 (: 108 (90 BAS | 25 days supply | Qty: 18 | Fill #2

## 2018-01-03 ENCOUNTER — Emergency Department (HOSPITAL_COMMUNITY)
Admission: EM | Admit: 2018-01-03 | Discharge: 2018-01-03 | Disposition: A | Discharge status: Home / Self Care | Payer: BLUE CROSS/BLUE SHIELD | Attending: Emergency Medicine | Admitting: Emergency Medicine

## 2018-01-03 ENCOUNTER — Other Ambulatory Visit: Payer: Self-pay

## 2018-01-03 ENCOUNTER — Encounter (HOSPITAL_COMMUNITY): Payer: Self-pay | Admitting: *Deleted

## 2018-01-03 ENCOUNTER — Emergency Department (HOSPITAL_COMMUNITY): Payer: BLUE CROSS/BLUE SHIELD

## 2018-01-03 DIAGNOSIS — I10 Essential (primary) hypertension: Secondary | ICD-10-CM | POA: Insufficient documentation

## 2018-01-03 DIAGNOSIS — Z79899 Other long term (current) drug therapy: Secondary | ICD-10-CM | POA: Diagnosis not present

## 2018-01-03 DIAGNOSIS — J45909 Unspecified asthma, uncomplicated: Secondary | ICD-10-CM | POA: Diagnosis not present

## 2018-01-03 DIAGNOSIS — M79672 Pain in left foot: Secondary | ICD-10-CM | POA: Diagnosis present

## 2018-01-03 DIAGNOSIS — Z87891 Personal history of nicotine dependence: Secondary | ICD-10-CM | POA: Diagnosis not present

## 2018-01-03 DIAGNOSIS — J302 Other seasonal allergic rhinitis: Secondary | ICD-10-CM | POA: Insufficient documentation

## 2018-01-03 DIAGNOSIS — M722 Plantar fascial fibromatosis: Secondary | ICD-10-CM | POA: Insufficient documentation

## 2018-01-03 MED ORDER — TRAMADOL HCL 50 MG PO TABS: 50.0000 mg | ORAL_TABLET | Freq: Four times a day (QID) | ORAL | 0 refills | Status: DC | PRN

## 2018-01-03 NOTE — ED Triage Notes (Signed)
The pt is c/o lt foot pain for one week.  No known injury  2 weeks ago

## 2018-01-03 NOTE — ED Provider Notes (Signed)
Patient placed in Quick Look pathway, seen and evaluated   Chief Complaint: L foot pain  HPI:  47 year old female presents with L heel pain for 1 week. She has not had this before. She wear steel toe boots and tried changing out her shoes and wearing insoles with no relief. She tried Naproxen with no relief. She's had some mild swelling of the lower leg as well. She's never had this problem before.   ROS: +L foot pain  Physical Exam:   Gen: No distress  Neuro: Awake and Alert  Skin: Warm    Focused Exam: Left foot: No obvious swelling or deformity. Tenderness to palpation over the heel. 2+ DP pulse   Initiation of care has begun. The patient has been counseled on the process, plan, and necessity for staying for the completion/evaluation, and the remainder of the medical screening examination    Bethel BornGekas, Kelly Marie, PA-C 01/03/18 1633    Azalia Bilisampos, Kevin, MD 01/03/18 [phone]

## 2018-01-03 NOTE — ED Provider Notes (Addendum)
[facility] Note   CSN: [id number] Arrival date & time: 01/03/18  1554     History   Chief Complaint Chief Complaint  Patient presents with  . foot pain    HPI Wendy James is a 47 y.o. female.  HPI   47 year old female presenting for evaluation of left foot pain.  Patient report for the past 4 to 5 days she has had recurrent pain to her left foot.  She described the pain as a sharp throbbing sensation to the sole forefoot and for the past 2 days report swelling to her foot and pain radiates towards her leg.  Pain worse with prolonged standing or with walking.  Pain is moderate to severe.  He tries changing his shoes and wearing insoles without relief.  She denies any specific injury.  She denies any pain to her right foot.  She denies any numbness or weakness.  No back pain knee pain or hip pain.  Past Medical History:  Diagnosis Date  . 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 Asthma   . Seasonal allergies     Patient Active Problem List   Diagnosis Date Noted  . Influenza A with respiratory manifestations 09/06/2016  . Prediabetes 05/08/2015  . Essential hypertension 05/08/2015  . Mild intermittent asthma 05/06/2015  . Leukocytosis, steroid induced  04/29/2015  . Steroid-induced hyperglycemia 04/29/2015  . Asthma exacerbation 04/29/2015  . Accelerated hypertension 04/28/2015  . Anemia of chroic diseae, IDA 11/17/2013  . Bronchitis, acute 04/12/2012  . Acute asthma exacerbation 03/12/2012  . Acute respiratory failure (HCC) 03/12/2012    Past Surgical History:  Procedure Laterality Date  . CESAREAN SECTION     x 4  . CHOLECYSTECTOMY    . HYSTEROSCOPY W/D&C N/A 02/18/2015   Procedure: DILATATION AND CURETTAGE /HYSTEROSCOPY;  Surgeon: Allie BossierMyra C Dove, MD;  Location: [facility];  Service: Gynecology;  Laterality: N/A;  . TUBAL LIGATION       OB History    Gravida  5   Para  4   Term  4   Preterm  0   AB  1   Living    3     SAB  1   TAB  0   Ectopic  0   Multiple  0   Live Births               Home Medications    Prior to Admission medications   Medication Sig Start Date End Date Taking? Authorizing Provider  albuterol (PROVENTIL HFA;VENTOLIN HFA) 108 (90 Base) MCG/ACT inhaler Inhale 1-2 puffs into the lungs every 6 (six) hours as needed for wheezing or shortness of breath. 08/05/17   Massie MaroonHollis, Lachina M, FNP  amLODipine (NORVASC) 5 MG tablet Take 1 tablet (5 mg total) by mouth daily. 08/05/17   Massie MaroonHollis, Lachina M, FNP  Fluticasone-Salmeterol (ADVAIR) 100-50 MCG/DOSE AEPB Inhale 1 puff into the lungs 2 (two) times daily. 08/05/17   Massie MaroonHollis, Lachina M, FNP  metroNIDAZOLE (FLAGYL) 500 MG tablet Take 1 tablet (500 mg total) by mouth 2 (two) times daily. 09/30/17   Allie Bossierove, Myra C, MD  montelukast (SINGULAIR) 10 MG tablet Take 1 tablet (10 mg total) by mouth at bedtime. 08/05/17   Massie MaroonHollis, Lachina M, FNP    Family History Family History  Problem Relation Age of Onset  . Hypertension Mother   . Stroke Sister   . Seizures Sister     Social History Social  History   Tobacco Use  . Smoking status: Former Smoker    Packs/day: 0.75    Types: Cigarettes    Last attempt to quit: 11/17/1988    Years since quitting: 29.1  . Smokeless tobacco: Never Used  Substance Use Topics  . Alcohol use: No  . Drug use: No     Allergies   Mobic [meloxicam] and Shrimp [shellfish allergy]   Review of Systems Review of Systems  Constitutional: Negative for fever.  Musculoskeletal: Positive for arthralgias.  Neurological: Negative for numbness.     Physical Exam Updated Vital Signs BP (!) 174/105   Pulse 70   Temp 98.4 F (36.9 C)   Resp 16   Ht 5\' 2"  (1.575 m)   Wt 98 kg (216 lb)   LMP 12/12/2017   SpO2 99%   BMI 39.51 kg/m   Physical Exam  Constitutional: She appears well-developed and well-nourished. No distress.  HENT:  Head: Atraumatic.  Eyes: Conjunctivae are normal.  Neck: Neck supple.   Musculoskeletal: She exhibits tenderness (Left foot: Tenderness to the sole foot most significant at the heel on palpation.  No deformity noted.  No bruising, no erythema warmth or edema appreciated.  No tenderness to the dorsum of the foot.  DP pulse palpable with brisk cap refill.  ).  No tenderness to left calf, no edema appreciated, leg compartment soft.  Neurological: She is alert.  Skin: No rash noted.  Psychiatric: She has a normal mood and affect.  Nursing note and vitals reviewed.    ED Treatments / Results  Labs (all labs ordered are listed, but only abnormal results are displayed) Labs Reviewed - No data to display  EKG None  Radiology Dg Foot Complete Left  Result Date: 01/03/2018 CLINICAL DATA:  Left foot pain.  No injury. EXAM: LEFT FOOT - COMPLETE 3+ VIEW COMPARISON:  None. FINDINGS: No acute fracture or dislocation. Mild joint space narrowing and marginal osteophyte formation at the first MTP joint. Remaining joint spaces are preserved. Bone mineralization is normal. Large plantar enthesophyte. Soft tissues are unremarkable. IMPRESSION: Mild first MTP joint osteoarthritis.  No acute osseous abnormality. Electronically Signed   By: Obie Dredge M.D.   On: 01/03/2018 17:33    Procedures Procedures (including critical care time)  Medications Ordered in ED Medications - No data to display   Initial Impression / Assessment and Plan / ED Course  I have reviewed the triage vital signs and the nursing notes.  Pertinent labs & imaging results that were available during my care of the patient were reviewed by me and considered in my medical decision making (see chart for details).     BP (!) 174/105   Pulse 70   Temp 98.4 F (36.9 C)   Resp 16   Ht 5\' 2"  (1.575 m)   Wt 98 kg (216 lb)   LMP 12/12/2017   SpO2 99%   BMI 39.51 kg/m  The patient was noted to be hypertensive today in the emergency department. I have spoken with the patient regarding hypertension  and the need for improved management. I instructed the patient to followup with the Primary care doctor within 4 days to improve the management of the patient's hypertension. I also counseled the patient regarding the signs and symptoms which would require an emergent visit to an emergency department for hypertensive urgency and/or hypertensive emergency. The patient understood the need for improved hypertensive management.   Final Clinical Impressions(s) / ED Diagnoses   Final diagnoses:  Plantar fasciitis, left    ED Discharge Orders        Ordered    traMADol (ULTRAM) 50 MG tablet  Every 6 hours PRN     01/03/18 1835     6:29 PM Patient here with tenderness to the heel of her left foot suggestive of plantar fasciitis.  No evidence concerning for gout or septic joint.  No injury.  X-ray unremarkable.  No signs of cellulitis.  Encourage patient to use NSAIDs, wear orthotic, use tennis ball or cool bottle of water to help soothe the muscle.  Podiatry referral given as needed. In order to decrease risk of narcotic abuse. Pt's record were checked using the [place] Controlled Substance database.     Fayrene Helper, PA-C 01/03/18 1836    Fayrene Helper, PA-C 01/03/18 1836    Tegeler, Canary Brim, MD 01/04/18 [phone]

## 2018-01-13 ENCOUNTER — Ambulatory Visit: Payer: BLUE CROSS/BLUE SHIELD

## 2018-01-13 ENCOUNTER — Ambulatory Visit: Payer: BLUE CROSS/BLUE SHIELD | Admitting: Podiatry

## 2018-01-13 ENCOUNTER — Encounter: Payer: Self-pay | Admitting: Podiatry

## 2018-01-13 DIAGNOSIS — M722 Plantar fascial fibromatosis: Secondary | ICD-10-CM | POA: Diagnosis not present

## 2018-01-13 MED ORDER — TRIAMCINOLONE ACETONIDE 10 MG/ML IJ SUSP
10.0000 mg | Freq: Once | INTRAMUSCULAR | Status: AC
  Administered 2018-01-13: 10 mg

## 2018-01-13 MED ORDER — IBUPROFEN 600 MG PO TABS: 600.0000 mg | ORAL_TABLET | Freq: Three times a day (TID) | ORAL | 0 refills | Status: DC | PRN

## 2018-01-13 NOTE — Patient Instructions (Signed)

## 2018-01-15 ENCOUNTER — Emergency Department (HOSPITAL_COMMUNITY): Payer: BLUE CROSS/BLUE SHIELD

## 2018-01-15 ENCOUNTER — Other Ambulatory Visit: Payer: Self-pay

## 2018-01-15 ENCOUNTER — Encounter (HOSPITAL_COMMUNITY): Payer: Self-pay

## 2018-01-15 ENCOUNTER — Emergency Department (HOSPITAL_COMMUNITY)
Admission: EM | Admit: 2018-01-15 | Discharge: 2018-01-15 | Disposition: A | Discharge status: Home / Self Care | Payer: BLUE CROSS/BLUE SHIELD | Attending: Emergency Medicine | Admitting: Emergency Medicine

## 2018-01-15 DIAGNOSIS — R200 Anesthesia of skin: Secondary | ICD-10-CM | POA: Diagnosis not present

## 2018-01-15 DIAGNOSIS — R0789 Other chest pain: Secondary | ICD-10-CM

## 2018-01-15 DIAGNOSIS — J4520 Mild intermittent asthma, uncomplicated: Secondary | ICD-10-CM | POA: Insufficient documentation

## 2018-01-15 DIAGNOSIS — I10 Essential (primary) hypertension: Secondary | ICD-10-CM | POA: Diagnosis not present

## 2018-01-15 DIAGNOSIS — Z79899 Other long term (current) drug therapy: Secondary | ICD-10-CM | POA: Diagnosis not present

## 2018-01-15 DIAGNOSIS — Z87891 Personal history of nicotine dependence: Secondary | ICD-10-CM | POA: Insufficient documentation

## 2018-01-15 HISTORY — DX: Other enthesopathy of unspecified foot and ankle: M77.50

## 2018-01-15 LAB — BASIC METABOLIC PANEL
ANION GAP: 5 (ref 5–15)
BUN: 12 mg/dL (ref 6–20)
CALCIUM: 8.3 mg/dL — AB (ref 8.9–10.3)
CHLORIDE: 110 mmol/L (ref 101–111)
CO2: 24 mmol/L (ref 22–32)
Creatinine, Ser: 0.74 mg/dL (ref 0.44–1.00)
GFR calc non Af Amer: >60 mL/min (ref >60)
GLUCOSE: 101 mg/dL — AB (ref 65–99)
POTASSIUM: 3.8 mmol/L (ref 3.5–5.1)
Sodium: 139 mmol/L (ref 135–145)

## 2018-01-15 LAB — CBC
HEMATOCRIT: 36.2 % (ref 36.0–46.0)
HEMOGLOBIN: 11.8 g/dL — AB (ref 12.0–15.0)
MCH: 30.3 pg (ref 26.0–34.0)
MCHC: 32.6 g/dL (ref 30.0–36.0)
MCV: 93.1 fL (ref 78.0–100.0)
Platelets: 203 10*3/uL (ref 150–400)
RBC: 3.89 MIL/uL (ref 3.87–5.11)
RDW: 13.4 % (ref 11.5–15.5)
WBC: 3.9 10*3/uL — ABNORMAL LOW (ref 4.0–10.5)

## 2018-01-15 LAB — I-STAT BETA HCG BLOOD, ED (MC, WL, AP ONLY): I-stat hCG, quantitative: <5 m[IU]/mL (ref <5)

## 2018-01-15 LAB — I-STAT TROPONIN, ED
TROPONIN I, POC: 0 ng/mL (ref 0.00–0.08)
TROPONIN I, POC: 0 ng/mL (ref 0.00–0.08)

## 2018-01-15 MED ORDER — METHOCARBAMOL 500 MG PO TABS
1000.0000 mg | ORAL_TABLET | Freq: Once | ORAL | Status: AC
  Administered 2018-01-15: 1000 mg via ORAL
  Filled 2018-01-15: qty 2

## 2018-01-15 MED ORDER — METHOCARBAMOL 500 MG PO TABS: 500.0000 mg | ORAL_TABLET | Freq: Two times a day (BID) | ORAL | 0 refills | Status: DC

## 2018-01-15 MED ORDER — LIDOCAINE 5 % EX PTCH: 1.0000 | MEDICATED_PATCH | CUTANEOUS | 0 refills | Status: DC

## 2018-01-15 MED ORDER — OXYCODONE-ACETAMINOPHEN 5-325 MG PO TABS
1.0000 | ORAL_TABLET | Freq: Once | ORAL | Status: AC
  Administered 2018-01-15: 1 via ORAL
  Filled 2018-01-15: qty 1

## 2018-01-15 NOTE — ED Provider Notes (Signed)
[facility]-EMERGENCY DEPT Provider Note   CSN: [id number] Arrival date & time: 01/15/18  1348     History   Chief Complaint Chief Complaint  Patient presents with  . arm numbness  . Chest Pain    HPI Wendy James is a 47 y.o. female.  HPI   Wendy James is a 47 y.o. female, with a history of asthma and arthritis, presenting to the ED with right-sided chest pain beginning around 12:30 PM today.  States she pulled herself off of the toilet after using the restroom and began to feel sharp pain in the right chest just under the breast.  Pain has improved since onset.  Currently rates it 7/10, constant, worse with movement and palpation, nonradiating.  Also endorses having some tingling in the right arm that has resolved.  Denies shortness of breath, cough, fever/chills, N/V/D, abdominal pain, dizziness, diaphoresis, or any other complaints.    Past Medical History:  Diagnosis Date  . 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 Asthma   . Bone spur of foot   . Seasonal allergies     Patient Active Problem List   Diagnosis Date Noted  . Influenza A with respiratory manifestations 09/06/2016  . Prediabetes 05/08/2015  . Essential hypertension 05/08/2015  . Mild intermittent asthma 05/06/2015  . Leukocytosis, steroid induced  04/29/2015  . Steroid-induced hyperglycemia 04/29/2015  . Asthma exacerbation 04/29/2015  . Accelerated hypertension 04/28/2015  . Anemia of chroic diseae, IDA 11/17/2013  . Bronchitis, acute 04/12/2012  . Acute asthma exacerbation 03/12/2012  . Acute respiratory failure (HCC) 03/12/2012    Past Surgical History:  Procedure Laterality Date  . CESAREAN SECTION     x 4  . CHOLECYSTECTOMY    . HYSTEROSCOPY W/D&C N/A 02/18/2015   Procedure: DILATATION AND CURETTAGE /HYSTEROSCOPY;  Surgeon: Allie Bossier, MD;  Location: [facility];  Service: Gynecology;  Laterality: N/A;  . TUBAL LIGATION       OB History    Gravida  5   Para    4   Term  4   Preterm  0   AB  1   Living  3     SAB  1   TAB  0   Ectopic  0   Multiple  0   Live Births               Home Medications    Prior to Admission medications   Medication Sig Start Date End Date Taking? Authorizing Provider  albuterol (PROVENTIL HFA;VENTOLIN HFA) 108 (90 Base) MCG/ACT inhaler Inhale 1-2 puffs into the lungs every 6 (six) hours as needed for wheezing or shortness of breath. 08/05/17  Yes Massie Maroon, FNP  amLODipine (NORVASC) 5 MG tablet Take 1 tablet (5 mg total) by mouth daily. 08/05/17  Yes Massie Maroon, FNP  ibuprofen (ADVIL,MOTRIN) 600 MG tablet Take 1 tablet (600 mg total) by mouth every 8 (eight) hours as needed. 01/13/18  Yes Vivi Barrack, DPM  SYMBICORT 80-4.5 MCG/ACT inhaler INHALE 2 PUFFS INTO THE LUNGS 2 (TWO) TIMES DAILY. 12/16/17  Yes [provider]  Fluticasone-Salmeterol (ADVAIR) 100-50 MCG/DOSE AEPB Inhale 1 puff into the lungs 2 (two) times daily. Patient not taking: Reported on 01/15/2018 08/05/17   Massie Maroon, FNP  lidocaine (LIDODERM) 5 % Place 1 patch onto the skin daily. Remove & Discard patch within 12 hours or as directed by MD 01/15/18   Harolyn Rutherford  C, PA-C  methocarbamol (ROBAXIN) 500 MG tablet Take 1 tablet (500 mg total) by mouth 2 (two) times daily. 01/15/18   Kimmie Doren C, PA-C  metroNIDAZOLE (FLAGYL) 500 MG tablet Take 1 tablet (500 mg total) by mouth 2 (two) times daily. Patient not taking: Reported on 01/15/2018 09/30/17   Allie Bossier, MD  montelukast (SINGULAIR) 10 MG tablet Take 1 tablet (10 mg total) by mouth at bedtime. Patient not taking: Reported on 01/15/2018 08/05/17   Massie Maroon, FNP  traMADol (ULTRAM) 50 MG tablet Take 1 tablet (50 mg total) by mouth every 6 (six) hours as needed for moderate pain. Patient not taking: Reported on 01/15/2018 01/03/18   Fayrene Helper, PA-C    Family History Family History  Problem Relation Age of Onset  . Hypertension Mother   . Stroke Sister    . Seizures Sister     Social History Social History   Tobacco Use  . Smoking status: Former Smoker    Packs/day: 0.75    Types: Cigarettes    Last attempt to quit: 11/17/1988    Years since quitting: 29.1  . Smokeless tobacco: Never Used  Substance Use Topics  . Alcohol use: No  . Drug use: No     Allergies   Mobic [meloxicam] and Shrimp [shellfish allergy]   Review of Systems Review of Systems  Constitutional: Negative for chills, diaphoresis and fever.  Respiratory: Negative for cough and shortness of breath.   Cardiovascular: Positive for chest pain. Negative for palpitations and leg swelling.  Gastrointestinal: Negative for abdominal pain, diarrhea, nausea and vomiting.  Musculoskeletal: Negative for back pain and neck pain.  Neurological: Negative for dizziness, syncope, weakness, light-headedness and numbness.  All other systems reviewed and are negative.    Physical Exam Updated Vital Signs BP (!) 164/107 (BP Location: Right Arm)   Pulse 77   Temp 99.6 F (37.6 C) (Oral)   Resp 20   Ht 5\' 2"  (1.575 m)   Wt 95.7 kg (211 lb)   LMP 12/12/2017   SpO2 99%   BMI 38.59 kg/m   Physical Exam  Constitutional: She appears well-developed and well-nourished. No distress.  HENT:  Head: Normocephalic and atraumatic.  Eyes: Conjunctivae are normal.  Neck: Neck supple.  Cardiovascular: Normal rate, regular rhythm, normal heart sounds and intact distal pulses.  Pulmonary/Chest: Effort normal and breath sounds normal. No respiratory distress.  Tenderness in the region indicated.  No erythema, bruising, deformity, crepitus, swelling, or instability. Patient shows no increased work of breathing.  Speaks in full sentences without difficulty.    Abdominal: Soft. There is no tenderness. There is no guarding.  Musculoskeletal: She exhibits no edema.  Full range of motion through the cardinal directions of the right shoulder.  Lymphadenopathy:    She has no cervical  adenopathy.  Neurological: She is alert.  Patient denies sensory deficits. Sensation grossly intact to light touch through each of the nerve distributions of the bilateral upper extremities. Abduction and adduction of the fingers intact against resistance. Grip strength equal bilaterally. Supination and pronation intact against resistance. Strength 5/5 through the cardinal directions of the bilateral wrists. Strength 5/5 in the bilateral biceps/triceps. Patient can touch the thumb to each one of the fingertips without difficulty.   Skin: Skin is warm and dry. She is not diaphoretic.  Psychiatric: She has a normal mood and affect. Her behavior is normal.  Nursing note and vitals reviewed.    ED Treatments / Results  Labs (all  labs ordered are listed, but only abnormal results are displayed) Labs Reviewed  BASIC METABOLIC PANEL - Abnormal; Notable for the following components:      Result Value   Glucose, Bld 101 (*)    Calcium 8.3 (*)    All other components within normal limits  CBC - Abnormal; Notable for the following components:   WBC 3.9 (*)    Hemoglobin 11.8 (*)    All other components within normal limits  I-STAT TROPONIN, ED  I-STAT BETA HCG BLOOD, ED ([facility], [facility], [facility] ONLY)  I-STAT TROPONIN, ED    EKG EKG Interpretation  Date/Time:  Wednesday January 15 2018 14:04:46 EDT Ventricular Rate:  79 PR Interval:    QRS Duration: 77 QT Interval:  356 QTC Calculation: 408 R Axis:   -39 Text Interpretation:  Sinus rhythm Left axis deviation Anterior infarct, old No significant change since last tracing Confirmed by Richardean Canal [phone]) on 01/15/2018 6:06:04 PM  Radiology Dg Chest 2 View  Result Date: 01/15/2018 CLINICAL DATA:  Chest pain and shortness of breath EXAM: CHEST - 2 VIEW COMPARISON:  July 27, 2017 FINDINGS: Lungs are clear. The heart size and pulmonary vascularity are normal. No adenopathy. No pneumothorax. No bone lesions. IMPRESSION: No edema or consolidation.  Electronically Signed   By: Bretta Bang III M.D.   On: 01/15/2018 14:29    Procedures Procedures (including critical care time)  Medications Ordered in ED Medications  methocarbamol (ROBAXIN) tablet 1,000 mg (1,000 mg Oral Given 01/15/18 1625)  oxyCODONE-acetaminophen (PERCOCET/ROXICET) 5-325 MG per tablet 1 tablet (1 tablet Oral Given 01/15/18 1625)     Initial Impression / Assessment and Plan / ED Course  I have reviewed the triage vital signs and the nursing notes.  Pertinent labs & imaging results that were available during my care of the patient were reviewed by me and considered in my medical decision making (see chart for details).  Clinical Course as of Jan 15 1805  Wed Jan 15, 2018  1648 Endorses improvement in pain. Patient laying in the bed on her right side talking on the phone.   [SJ]  1759 Patient states she "feels great."    [SJ]    Clinical Course User Index [SJ] Victora Irby C, PA-C    Patient presents with right-sided chest pain.  Reproducible on exam. Low suspicion for ACS. HEART score is 2, indicating low risk for a cardiac event. PERC negative. Delta troponins negative.  Takes ibuprofen without a problem, despite her listed Mobic allergy.  PCP follow-up as needed. The patient was given instructions for home care as well as return precautions. Patient voices understanding of these instructions, accepts the plan, and is comfortable with discharge.    Vitals:   01/15/18 1405 01/15/18 1406 01/15/18 1533 01/15/18 1600  BP: (!) 164/107  (!) 159/84 139/85  Pulse: 77  70   Resp: 20  17 16   Temp: 99.6 F (37.6 C)     TempSrc: Oral     SpO2: 99%  100% 99%  Weight:  95.7 kg (211 lb)    Height:  5\' 2"  (1.575 m)       Final Clinical Impressions(s) / ED Diagnoses   Final diagnoses:  Atypical chest pain    ED Discharge Orders        Ordered    methocarbamol (ROBAXIN) 500 MG tablet  2 times daily     01/15/18 1736    lidocaine (LIDODERM) 5 %  Every 24  hours  01/15/18 1736       Anselm PancoastJoy, Kimmie Berggren C, PA-C 01/15/18 1806    Linwood DibblesKnapp, Jon, MD 01/15/18 2314

## 2018-01-15 NOTE — ED Triage Notes (Addendum)
Patient reports that she has been having pain under the right breast since this AM. Right chest area pain worse with movement. Patient states approx 30 minutes ago she had a sharp pain and then she developed arm numbness which resolved. Patient states she still has some tingling to the right fingers. Patient denies any SOB or diaphoresis.

## 2018-01-15 NOTE — Discharge Instructions (Addendum)
Take it easy, but do not lay around too much as this may make any soreness worse.  Antiinflammatory medications: Take 600 mg of ibuprofen every 6 hours or 440 mg (over the counter dose) to 500 mg (prescription dose) of naproxen every 12 hours for the next 3 days. After this time, these medications may be used as needed for pain. Take these medications with food to avoid upset stomach. Choose only one of these medications, do not take them together.  Tylenol: Should you continue to have additional pain while taking the ibuprofen or naproxen, you may add in tylenol as needed. Your daily total maximum amount of tylenol from all sources should be limited to 4000mg /day for persons without liver problems, or 2000mg /day for those with liver problems. Muscle relaxer: Robaxin is a muscle relaxer and may help loosen stiff muscles. Do not take the Robaxin while driving or performing other dangerous activities.  Lidocaine patches: These are available via either prescription or over-the-counter. The over-the-counter option may be more economical one and are likely just as effective. There are multiple over-the-counter brands, such as Salonpas.  Follow up with a primary care provider for any future management of these complaints.  Your blood pressure was noted to be higher than normal today.  Please follow-up with your primary care provider on this matter.

## 2018-01-20 NOTE — Progress Notes (Signed)
Subjective:   Patient ID: Wendy James, female   DOB: 47 y.o.   MRN: [id number]   HPI 47 year old female presents the office with concerns of left heel pain which is been ongoing for 1 month.  She describes a sharp, throbbing pain in the front of her heel is worse with prolonged activity.  She is tried over-the-counter inserts which helps some she is been rolling a tennis ball and she has been elevating it as well as taking naproxen for pain.  She denies any recent injury or trauma to her feet.  The pain does not wake her up at night.  She has no other concerns.  Review of Systems  All other systems reviewed and are negative.  Past Medical History:  Diagnosis Date  . 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 Asthma   . Bone spur of foot   . Seasonal allergies     Past Surgical History:  Procedure Laterality Date  . CESAREAN SECTION     x 4  . CHOLECYSTECTOMY    . HYSTEROSCOPY W/D&C N/A 02/18/2015   Procedure: DILATATION AND CURETTAGE /HYSTEROSCOPY;  Surgeon: Allie Bossier, MD;  Location: [facility];  Service: Gynecology;  Laterality: N/A;  . TUBAL LIGATION       Current Outpatient Medications:  .  albuterol (PROVENTIL HFA;VENTOLIN HFA) 108 (90 Base) MCG/ACT inhaler, Inhale 1-2 puffs into the lungs every 6 (six) hours as needed for wheezing or shortness of breath., Disp: 1 Inhaler, Rfl: 5 .  amLODipine (NORVASC) 5 MG tablet, Take 1 tablet (5 mg total) by mouth daily., Disp: 30 tablet, Rfl: 5 .  Fluticasone-Salmeterol (ADVAIR) 100-50 MCG/DOSE AEPB, Inhale 1 puff into the lungs 2 (two) times daily. (Patient not taking: Reported on 01/15/2018), Disp: 1 each, Rfl: 3 .  ibuprofen (ADVIL,MOTRIN) 600 MG tablet, Take 1 tablet (600 mg total) by mouth every 8 (eight) hours as needed., Disp: 30 tablet, Rfl: 0 .  lidocaine (LIDODERM) 5 %, Place 1 patch onto the skin daily. Remove & Discard patch within 12 hours or as directed by MD, Disp: 30 patch, Rfl: 0 .  methocarbamol (ROBAXIN) 500 MG tablet, Take 1  tablet (500 mg total) by mouth 2 (two) times daily., Disp: 20 tablet, Rfl: 0 .  metroNIDAZOLE (FLAGYL) 500 MG tablet, Take 1 tablet (500 mg total) by mouth 2 (two) times daily. (Patient not taking: Reported on 01/15/2018), Disp: 14 tablet, Rfl: 0 .  montelukast (SINGULAIR) 10 MG tablet, Take 1 tablet (10 mg total) by mouth at bedtime. (Patient not taking: Reported on 01/15/2018), Disp: 30 tablet, Rfl: 11 .  SYMBICORT 80-4.5 MCG/ACT inhaler, INHALE 2 PUFFS INTO THE LUNGS 2 (TWO) TIMES DAILY., Disp: , Rfl: 5 .  traMADol (ULTRAM) 50 MG tablet, Take 1 tablet (50 mg total) by mouth every 6 (six) hours as needed for moderate pain. (Patient not taking: Reported on 01/15/2018), Disp: 15 tablet, Rfl: 0  Allergies  Allergen Reactions  . Mobic [Meloxicam] Hives    Pt reports she gets real bad hives from Mobic  . Shrimp [Shellfish Allergy] Other (See Comments)    Wheezing.  Patient states she is ok with betadine         Objective:  Physical Exam  General: AAO x3, NAD  Dermatological: Skin is warm, dry and supple bilateral. Nails x 10 are well manicured; remaining integument appears unremarkable at this time. There are no open sores, no preulcerative lesions, no rash or signs of infection present.  Vascular: Dorsalis Pedis artery and Posterior Tibial artery pedal pulses are 2/4 bilateral with immedate capillary fill time. Pedal hair growth present. No varicosities and no lower extremity edema present bilateral. There is no pain with calf compression, swelling, warmth, erythema.   Neruologic: Grossly intact via light touch bilateral. Vibratory intact via tuning fork bilateral. Protective threshold with Semmes Wienstein monofilament intact to all pedal sites bilateral.  Negative Tinel sign.  Musculoskeletal: Tenderness to palpation along the plantar medial tubercle of the calcaneus at the insertion of plantar fascia on the left foot. There is no pain along the course of the plantar fascia within the arch of  the foot. Plantar fascia appears to be intact. There is no pain with lateral compression of the calcaneus or pain with vibratory sensation. There is no pain along the course or insertion of the achilles tendon. No other areas of tenderness to bilateral lower extremities. Muscular strength 5/5 in all groups tested bilateral.  Gait: Unassisted, Nonantalgic.       Assessment:   Left heel pain, plantar fasciitis    Plan:  -Treatment options discussed including all alternatives, risks, and complications -Etiology of symptoms were discussed -X-rays were obtained and reviewed with the patient.  No evidence of acute fracture or stress fracture. -Steroid injection performed.  See procedure note below. -Anti-inflammatories as needed -Plantar fascial brace is dispensed -Stretching, icing daily -Discussed shoe modifications and orthotics  Procedure: Injection Tendon/Ligament Discussed alternatives, risks, complications and verbal consent was obtained.  Location: Left plantar fascia at the glabrous junction; medial approach. Skin Prep: Alcohol. Injectate: 0.5cc 0.5% marcaine plain, 0.5 cc 2% lidocaine plain and, 1 cc kenalog 10. Disposition: Patient tolerated procedure well. Injection site dressed with a band-aid.  Post-injection care was discussed and return precautions discussed.   Vivi BarrackMatthew R Wagoner DPM

## 2018-02-03 ENCOUNTER — Ambulatory Visit: Payer: BLUE CROSS/BLUE SHIELD | Admitting: Family Medicine

## 2018-02-06 ENCOUNTER — Ambulatory Visit: Payer: BLUE CROSS/BLUE SHIELD | Admitting: Podiatry

## 2018-02-14 ENCOUNTER — Encounter: Payer: Self-pay | Admitting: Family Medicine

## 2018-02-14 ENCOUNTER — Ambulatory Visit (INDEPENDENT_AMBULATORY_CARE_PROVIDER_SITE_OTHER): Payer: BLUE CROSS/BLUE SHIELD | Admitting: Family Medicine

## 2018-02-14 VITALS — BP 149/86 | HR 83 | Temp 98.6°F | Resp 16 | Ht 62.0 in | Wt 222.0 lb

## 2018-02-14 DIAGNOSIS — I10 Essential (primary) hypertension: Secondary | ICD-10-CM

## 2018-02-14 DIAGNOSIS — Z23 Encounter for immunization: Secondary | ICD-10-CM

## 2018-02-14 DIAGNOSIS — J4520 Mild intermittent asthma, uncomplicated: Secondary | ICD-10-CM | POA: Diagnosis not present

## 2018-02-14 LAB — POCT URINALYSIS DIPSTICK
Bilirubin, UA: NEGATIVE
Blood, UA: NEGATIVE
Glucose, UA: NEGATIVE
Ketones, UA: NEGATIVE
Leukocytes, UA: NEGATIVE
Nitrite, UA: NEGATIVE
Protein, UA: NEGATIVE
Spec Grav, UA: 1.02 (ref 1.010–1.025)
Urobilinogen, UA: 0.2 E.U./dL
pH, UA: 7.5 (ref 5.0–8.0)

## 2018-02-14 MED ORDER — IBUPROFEN 600 MG PO TABS: 600.0000 mg | ORAL_TABLET | Freq: Three times a day (TID) | ORAL | 0 refills | Status: DC | PRN

## 2018-02-14 MED ORDER — FLUTICASONE-SALMETEROL 100-50 MCG/DOSE IN AEPB: 1.0000 | INHALATION_SPRAY | Freq: Two times a day (BID) | RESPIRATORY_TRACT | 5 refills | Status: DC

## 2018-02-14 MED ORDER — LEVOCETIRIZINE DIHYDROCHLORIDE 5 MG PO TABS: 5.0000 mg | ORAL_TABLET | Freq: Every evening | ORAL | 11 refills | Status: DC

## 2018-02-14 MED ORDER — ALBUTEROL SULFATE HFA 108 (90 BASE) MCG/ACT IN AERS: 1.0000 | INHALATION_SPRAY | Freq: Four times a day (QID) | RESPIRATORY_TRACT | 5 refills | Status: DC | PRN

## 2018-02-14 MED ORDER — SYMBICORT 80-4.5 MCG/ACT IN AERO: INHALATION_SPRAY | RESPIRATORY_TRACT | 5 refills | Status: DC

## 2018-02-14 MED ORDER — AMLODIPINE BESYLATE 5 MG PO TABS: 5.0000 mg | ORAL_TABLET | Freq: Every day | ORAL | 5 refills | Status: DC

## 2018-02-14 NOTE — Progress Notes (Signed)
Subjective   Wendy James 47 y.o. female  [id number]  [id number]  03/15/1971    Chief Complaint  Patient presents with  . Hypertension  . Allergic Reaction     Subjective:    Patient ID: Wendy James, female    DOB: 01/08/1971, 47 y.o.   MRN: [id number]  Wendy James, a 47 year old female presents for a follow up of asthma and hypertension. Seen in the ED in June for atypical chest pain.  She says that symptoms have improved since that time.    Asthma  There is no chest tightness, cough, frequent throat clearing, hemoptysis, hoarse voice, shortness of breath, sputum production or wheezing. Primary symptoms comments: . Her past medical history is significant for asthma. Patient reports taking both advair and symbort Hypertension  This is a recurrent problem. The current episode started more than 1 month ago. The problem has been rapidly improving since onset. The problem is controlled. Pertinent negatives include no anxiety, blurred vision, chest pain, headaches, malaise/fatigue, neck pain, orthopnea, palpitations, peripheral edema, PND, shortness of breath or sweats. There are no associated agents to hypertension. Risk factors for coronary artery disease include obesity and sedentary lifestyle. Past treatments include calcium channel blockers. The current treatment provides moderate improvement.  Past Medical History:  Diagnosis Date  . 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 Asthma   . Bone spur of foot   . Seasonal allergies    Social History   Socioeconomic History  . Marital status: Married    Spouse name: Not on file  . Number of children: Not on file  . Years of education: Not on file  . Highest education level: Not on file  Occupational History  . Not on file  Social Needs  . Financial resource strain: Not on file  . Food insecurity:    Worry: Not on file    Inability: Not on file  . Transportation needs:    Medical: Not on file    Non-medical:  Not on file  Tobacco Use  . Smoking status: Former Smoker    Packs/day: 0.75    Types: Cigarettes    Last attempt to quit: 11/17/1988    Years since quitting: 29.2  . Smokeless tobacco: Never Used  Substance and Sexual Activity  . Alcohol use: No  . Drug use: No  . Sexual activity: Yes    Birth control/protection: Surgical  Lifestyle  . Physical activity:    Days per week: Not on file    Minutes per session: Not on file  . Stress: Not on file  Relationships  . Social connections:    Talks on phone: Not on file    Gets together: Not on file    Attends religious service: Not on file    Active member of club or organization: Not on file    Attends meetings of clubs or organizations: Not on file    Relationship status: Not on file  . Intimate partner violence:    Fear of current or ex partner: Not on file    Emotionally abused: Not on file    Physically abused: Not on file    Forced sexual activity: Not on file  Other Topics Concern  . Not on file  Social History Narrative  . Not on file   Immunization History  Administered Date(s) Administered  . Influenza Split 04/11/2012  . Influenza,inj,Quad PF,6+ Mos 04/30/2015  . Pneumococcal Polysaccharide-23 04/30/2015  . Tdap  02/14/2018   Review of Systems  Constitutional: Negative for fever, malaise/fatigue and unexpected weight change.  HENT: Negative for ear pain, hoarse voice, sneezing, sore throat and trouble swallowing.   Eyes: Negative.  Negative for blurred vision.  Respiratory: Negative for cough, hemoptysis, sputum production, shortness of breath and wheezing.   Cardiovascular: Negative.  Negative for chest pain, palpitations, orthopnea and PND.  Gastrointestinal:negative Endocrine: Negative.   Genitourinary: Negative.   Musculoskeletal: Negative.  Negative for neck pain.  Skin: Negative.   Allergic/Immunologic: Negative.   Neurological: Negative.  Negative for headaches.  Hematological: Negative.    Psychiatric/Behavioral: Negative.  Negative for sleep disturbance and suicidal ideas.       Objective:   Physical Exam  Constitutional: She is oriented to person, place, and time. She appears well-developed and well-nourished.  HENT:  Head: Normocephalic and atraumatic.  Right Ear: External ear normal.  Left Ear: External ear normal.  Mouth/Throat: Oropharynx is clear and moist.  Eyes: Conjunctivae and EOM are normal. Pupils are equal, round, and reactive to light.  Neck: Normal range of motion. Neck supple.  Cardiovascular: Normal rate, regular rhythm and intact distal pulses.  Pulmonary/Chest: Effort normal.  Abdominal: Soft. Bowel sounds are normal.  Musculoskeletal: Normal range of motion.  Neurological: She is alert and oriented to person, place, and time. She has normal reflexes.  Skin: Skin is warm and dry.  Psychiatric: She has a normal mood and affect. Her behavior is normal. Judgment and thought content normal.      BP (!) 149/86 (BP Location: Right Arm, Patient Position: Sitting, Cuff Size: Normal)   Pulse 83   Temp 98.6 F (37 C) (Oral)   Resp 16   Ht 5\' 2"  (1.575 m)   Wt 222 lb (100.7 kg)   LMP 01/17/2018   SpO2 98%   BMI 40.60 kg/m  Assessment & Plan:   1. Essential hypertension  - Urinalysis Dipstick - amLODipine (NORVASC) 5 MG tablet; Take 1 tablet (5 mg total) by mouth daily.  Dispense: 30 tablet; Refill: 5 - Lipid Panel; Future - Comprehensive metabolic panel; Future - TSH; Future - CBC with Differential; Future  2. Mild intermittent asthma, uncomplicated  - albuterol (PROVENTIL HFA;VENTOLIN HFA) 108 (90 Base) MCG/ACT inhaler; Inhale 1-2 puffs into the lungs every 6 (six) hours as needed for wheezing or shortness of breath.  Dispense: 1 Inhaler; Refill: 5 - Fluticasone-Salmeterol (ADVAIR) 100-50 MCG/DOSE AEPB; Inhale 1 puff into the lungs 2 (two) times daily.  Dispense: 1 each; Refill: 5   RTC; 6 months for chronic conditions

## 2018-02-14 NOTE — Patient Instructions (Signed)

## 2018-02-21 ENCOUNTER — Ambulatory Visit: Payer: BLUE CROSS/BLUE SHIELD | Admitting: Podiatry

## 2018-03-06 ENCOUNTER — Encounter: Payer: Self-pay | Admitting: Podiatry

## 2018-03-06 ENCOUNTER — Ambulatory Visit (INDEPENDENT_AMBULATORY_CARE_PROVIDER_SITE_OTHER): Payer: BLUE CROSS/BLUE SHIELD | Admitting: Podiatry

## 2018-03-06 DIAGNOSIS — M722 Plantar fascial fibromatosis: Secondary | ICD-10-CM | POA: Diagnosis not present

## 2018-03-06 MED ORDER — TRIAMCINOLONE ACETONIDE 10 MG/ML IJ SUSP
10.0000 mg | Freq: Once | INTRAMUSCULAR | Status: AC
  Administered 2018-03-06: 10 mg

## 2018-03-11 DIAGNOSIS — M722 Plantar fascial fibromatosis: Secondary | ICD-10-CM | POA: Insufficient documentation

## 2018-03-11 NOTE — Progress Notes (Signed)
Subjective: 47 year old female presents the office today for follow-up evaluation of left heel pain from plantar fasciitis.  She states the injection helped for a couple of days.  The brace was helpful but it did break.  She denies any recent injury or trauma.  Overall she is better but she still having pain. Denies any systemic complaints such as fevers, chills, nausea, vomiting. No acute changes since last appointment, and no other complaints at this time.   Objective: AAO x3, NAD DP/PT pulses palpable bilaterally, CRT less than 3 seconds There is no tenderness palpation along the plantar medial tubercle of the calcaneus at the insertion of the plantar fascia on the left foot.  Equinus is present.  There is no pain with lateral compression of calcaneus.  Achilles tendon, plantar fascia appears to be intact.  Negative Tinel sign.  No other areas of tenderness.  No open lesions or pre-ulcerative lesions.  No pain with calf compression, swelling, warmth, erythema  Assessment: Left heel pain, plantar fasciitis  Plan: -All treatment options discussed with the patient including all alternatives, risks, complications.  -A second steroid injection was performed.  See procedure note below. -Night splint dispensed -Discussed shoe modifications and orthotics -Continue stretching, icing daily. -Patient encouraged to call the office with any questions, concerns, change in symptoms.   Procedure: Injection Tendon/Ligament Discussed alternatives, risks, complications and verbal consent was obtained.  Location: Left plantar fascia at the glabrous junction; medial approach. Skin Prep: Alcohol. Injectate: 0.5cc 0.5% marcaine plain, 0.5 cc 2% lidocaine plain and, 1 cc kenalog 10. Disposition: Patient tolerated procedure well. Injection site dressed with a band-aid.  Post-injection care was discussed and return precautions discussed.   Return in about 3 weeks (around 03/27/2018).  Vivi BarrackMatthew R Kaelob Persky DPM

## 2018-03-17 MED FILL — AMLODIPINE BESYLATE 5 MG TA: 5 | 30 days supply | Qty: 30 | Fill #0

## 2018-03-17 MED FILL — SYMBICORT 80-4.5 MCG INHALE: 80-4.5 | 30 days supply | Qty: 7 | Fill #0

## 2018-03-17 MED FILL — ALBUTEROL SULFATE HFA 108 (: 108 (90 BAS | 25 days supply | Qty: 18 | Fill #0

## 2018-03-23 ENCOUNTER — Other Ambulatory Visit: Payer: Self-pay

## 2018-03-23 ENCOUNTER — Ambulatory Visit (HOSPITAL_COMMUNITY)
Admission: EM | Admit: 2018-03-23 | Discharge: 2018-03-23 | Disposition: A | Discharge status: Home / Self Care | Payer: BLUE CROSS/BLUE SHIELD

## 2018-03-23 ENCOUNTER — Encounter (HOSPITAL_COMMUNITY): Payer: Self-pay | Admitting: Emergency Medicine

## 2018-03-23 DIAGNOSIS — R609 Edema, unspecified: Secondary | ICD-10-CM

## 2018-03-23 NOTE — ED Provider Notes (Signed)
03/23/2018 12:51 PM   DOB: 30-Sep-1970 / MRN: [id number]  SUBJECTIVE:  Wendy James is a 47 y.o. female presenting for dent on the right upper shin that started today upon awakening.  Assoicates pain about the area.  Denies trauma, difficulty with gait.  Has tried nothing. Denies shortness of breath, chest pain.    She is allergic to mobic [meloxicam] and shrimp [shellfish allergy].   She  has a past medical history of Arthritis, Asthma, Bone spur of foot, and Seasonal allergies.    She  reports that she quit smoking about 29 years ago. Her smoking use included cigarettes. She smoked 0.75 packs per day. She has never used smokeless tobacco. She reports that she does not drink alcohol or use drugs. She  reports that she currently engages in sexual activity. She reports using the following method of birth control/protection: Surgical. The patient  has a past surgical history that includes Cholecystectomy; Cesarean section; Tubal ligation; and Hysteroscopy w/D&C (N/A, 02/18/2015).  Her family history includes Hypertension in her mother; Seizures in her sister; Stroke in her sister.  Review of Systems  Constitutional: Negative for diaphoresis.  Respiratory: Negative for cough, hemoptysis, sputum production, shortness of breath and wheezing.   Cardiovascular: Negative for chest pain, orthopnea and leg swelling.  Gastrointestinal: Negative for nausea.  Neurological: Negative for dizziness.    OBJECTIVE:  BP (!) 154/102 (BP Location: Left Arm)   Pulse 66   Temp 98 F (36.7 C) (Oral)   Resp 18   SpO2 99%   Wt Readings from Last 3 Encounters:  02/14/18 222 lb (100.7 kg)  01/15/18 211 lb (95.7 kg)  01/03/18 216 lb (98 kg)   Temp Readings from Last 3 Encounters:  03/23/18 98 F (36.7 C) (Oral)  02/14/18 98.6 F (37 C) (Oral)  01/15/18 99.6 F (37.6 C) (Oral)   BP Readings from Last 3 Encounters:  03/23/18 (!) 154/102  02/14/18 (!) 149/86  01/15/18 (!) 141/92   Pulse Readings from  Last 3 Encounters:  03/23/18 66  02/14/18 83  01/15/18 65    Physical Exam  Constitutional: She is oriented to person, place, and time. She appears well-nourished. No distress.  Eyes: Pupils are equal, round, and reactive to light. EOM are normal.  Cardiovascular: Normal rate.  Pulmonary/Chest: Effort normal.  Abdominal: She exhibits no distension.  Musculoskeletal: She exhibits edema (trace in the lower extremties bilatarally. ).       Legs: Neurological: She is alert and oriented to person, place, and time. No cranial nerve deficit. Gait normal.  Skin: Skin is dry. She is not diaphoretic.  Psychiatric: She has a normal mood and affect.  Vitals reviewed.   No results found for this or any previous visit (from the past 72 hour(s)).  No results found.  ASSESSMENT AND PLAN:   Trace edema - I have explained them mecahnism of edema and that the denting she experienced this morning was most likely secondary to pressure placed to the area, possibly by the other limb.  There are no signs of infection or bony abnormality. Reassurance provided.  Advised she follwo up with her PCP regarding her BP.    Discharge Instructions     Please see your PCP regarding your blood pressure.         The patient is advised to call or return to clinic if she does not see an improvement in symptoms, or to seek the care of the closest emergency department if she worsens with the above  plan.   Deliah BostonMichael Jayzon Taras, MHS, PA-C 03/23/2018 12:51 PM   Ofilia Neaslark, Sheron Robin L, PA-C 03/23/18 1251

## 2018-03-23 NOTE — ED Triage Notes (Signed)
Woke this morning noticing a dent in right lower leg/shin.  Patient says this is painful.    Denies fall or any injury

## 2018-03-23 NOTE — Discharge Instructions (Signed)
Please see your PCP regarding your blood pressure.

## 2018-03-27 ENCOUNTER — Encounter: Payer: BLUE CROSS/BLUE SHIELD | Admitting: Orthotics

## 2018-03-27 ENCOUNTER — Ambulatory Visit: Payer: BLUE CROSS/BLUE SHIELD | Admitting: Podiatry

## 2018-04-01 ENCOUNTER — Encounter: Payer: Self-pay | Admitting: Podiatry

## 2018-04-01 ENCOUNTER — Ambulatory Visit (INDEPENDENT_AMBULATORY_CARE_PROVIDER_SITE_OTHER): Payer: BLUE CROSS/BLUE SHIELD | Admitting: Podiatry

## 2018-04-01 DIAGNOSIS — M722 Plantar fascial fibromatosis: Secondary | ICD-10-CM

## 2018-04-01 MED ORDER — METHYLPREDNISOLONE 4 MG PO TBPK: ORAL_TABLET | ORAL | 0 refills | Status: DC

## 2018-04-02 ENCOUNTER — Encounter (HOSPITAL_COMMUNITY): Payer: Self-pay | Admitting: Emergency Medicine

## 2018-04-02 ENCOUNTER — Other Ambulatory Visit: Payer: Self-pay

## 2018-04-02 ENCOUNTER — Emergency Department (HOSPITAL_COMMUNITY): Payer: BLUE CROSS/BLUE SHIELD

## 2018-04-02 ENCOUNTER — Emergency Department (HOSPITAL_COMMUNITY)
Admission: EM | Admit: 2018-04-02 | Discharge: 2018-04-02 | Disposition: A | Discharge status: Home / Self Care | Payer: BLUE CROSS/BLUE SHIELD | Attending: Emergency Medicine | Admitting: Emergency Medicine

## 2018-04-02 DIAGNOSIS — R0602 Shortness of breath: Secondary | ICD-10-CM | POA: Diagnosis present

## 2018-04-02 DIAGNOSIS — I10 Essential (primary) hypertension: Secondary | ICD-10-CM | POA: Insufficient documentation

## 2018-04-02 DIAGNOSIS — Z79899 Other long term (current) drug therapy: Secondary | ICD-10-CM | POA: Diagnosis not present

## 2018-04-02 DIAGNOSIS — J4531 Mild persistent asthma with (acute) exacerbation: Secondary | ICD-10-CM | POA: Diagnosis not present

## 2018-04-02 DIAGNOSIS — Z87891 Personal history of nicotine dependence: Secondary | ICD-10-CM | POA: Insufficient documentation

## 2018-04-02 DIAGNOSIS — J069 Acute upper respiratory infection, unspecified: Secondary | ICD-10-CM | POA: Insufficient documentation

## 2018-04-02 MED ORDER — IPRATROPIUM BROMIDE 0.02 % IN SOLN
0.5000 mg | Freq: Once | RESPIRATORY_TRACT | Status: AC
  Administered 2018-04-02: 0.5 mg via RESPIRATORY_TRACT
  Filled 2018-04-02: qty 2.5

## 2018-04-02 MED ORDER — BENZONATATE 100 MG PO CAPS: 100.0000 mg | ORAL_CAPSULE | Freq: Three times a day (TID) | ORAL | 0 refills | Status: DC

## 2018-04-02 MED ORDER — PREDNISONE 20 MG PO TABS: 40.0000 mg | ORAL_TABLET | Freq: Every day | ORAL | 0 refills | Status: DC

## 2018-04-02 MED ORDER — ALBUTEROL SULFATE (5 MG/ML) 0.5% IN NEBU: 2.5000 mg | INHALATION_SOLUTION | Freq: Four times a day (QID) | RESPIRATORY_TRACT | 0 refills | Status: DC | PRN

## 2018-04-02 MED ORDER — ALBUTEROL SULFATE (2.5 MG/3ML) 0.083% IN NEBU
5.0000 mg | INHALATION_SOLUTION | Freq: Once | RESPIRATORY_TRACT | Status: AC
  Administered 2018-04-02: 5 mg via RESPIRATORY_TRACT
  Filled 2018-04-02: qty 6

## 2018-04-02 MED ORDER — PREDNISONE 20 MG PO TABS
60.0000 mg | ORAL_TABLET | Freq: Once | ORAL | Status: AC
  Administered 2018-04-02: 60 mg via ORAL
  Filled 2018-04-02: qty 3

## 2018-04-02 MED ORDER — ACETAMINOPHEN 325 MG PO TABS
650.0000 mg | ORAL_TABLET | Freq: Once | ORAL | Status: AC
  Administered 2018-04-02: 650 mg via ORAL
  Filled 2018-04-02: qty 2

## 2018-04-02 MED ORDER — BENZONATATE 100 MG PO CAPS
100.0000 mg | ORAL_CAPSULE | Freq: Once | ORAL | Status: AC
  Administered 2018-04-02: 100 mg via ORAL
  Filled 2018-04-02: qty 1

## 2018-04-02 NOTE — ED Provider Notes (Signed)
[facility] EMERGENCY DEPARTMENT Provider Note   CSN: [id number] Arrival date & time: 04/02/18  0531     History   Chief Complaint Chief Complaint  Patient presents with  . Shortness of Breath  . Emesis    HPI Wendy James is a 47 y.o. female.  Patient with history of asthma presents the emergency department today with 3-day history of wheezing and shortness of breath with cough.  Patient reports having some nasal congestion.  She has had some subjective fevers.  No sore throat or ear pain.  Cough is productive of a clear to yellow sputum.  She has some chest pains only when she is coughing.  No abdominal pain.  Patient has had some posttussive emesis.  No diarrhea or urinary symptoms.  She has been using her albuterol at home.  She reports using her nebulizer 3 times yesterday.  She received additional albuterol treatments upon arrival to the emergency department today which helped her feel somewhat better.  No lower extremity swelling reported.  Onset of symptoms acute.  Course is persistent.  Nothing makes symptoms worse.     Past Medical History:  Diagnosis Date  . 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 Asthma   . Bone spur of foot   . Seasonal allergies     Patient Active Problem List   Diagnosis Date Noted  . Plantar fasciitis, left 03/11/2018  . Influenza A with respiratory manifestations 09/06/2016  . Prediabetes 05/08/2015  . Essential hypertension 05/08/2015  . Mild intermittent asthma 05/06/2015  . Leukocytosis, steroid induced  04/29/2015  . Steroid-induced hyperglycemia 04/29/2015  . Asthma exacerbation 04/29/2015  . Accelerated hypertension 04/28/2015  . Anemia of chroic diseae, IDA 11/17/2013  . Bronchitis, acute 04/12/2012  . Acute asthma exacerbation 03/12/2012  . Acute respiratory failure (HCC) 03/12/2012    Past Surgical History:  Procedure Laterality Date  . CESAREAN SECTION     x 4  . CHOLECYSTECTOMY    . HYSTEROSCOPY W/D&C  N/A 02/18/2015   Procedure: DILATATION AND CURETTAGE /HYSTEROSCOPY;  Surgeon: Allie Bossier, MD;  Location: [facility];  Service: Gynecology;  Laterality: N/A;  . TUBAL LIGATION       OB History    Gravida  5   Para  4   Term  4   Preterm  0   AB  1   Living  3     SAB  1   TAB  0   Ectopic  0   Multiple  0   Live Births               Home Medications    Prior to Admission medications   Medication Sig Start Date End Date Taking? Authorizing Provider  albuterol (PROVENTIL HFA;VENTOLIN HFA) 108 (90 Base) MCG/ACT inhaler Inhale 1-2 puffs into the lungs every 6 (six) hours as needed for wheezing or shortness of breath. 02/14/18   Mike Gip, FNP  amLODipine (NORVASC) 5 MG tablet Take 1 tablet (5 mg total) by mouth daily. 02/14/18   Mike Gip, FNP  ibuprofen (ADVIL,MOTRIN) 600 MG tablet Take 1 tablet (600 mg total) by mouth every 8 (eight) hours as needed. 02/14/18   Mike Gip, FNP  methylPREDNISolone (MEDROL DOSEPAK) 4 MG TBPK tablet Take as directed 04/01/18   Vivi Barrack, DPM  SYMBICORT 80-4.5 MCG/ACT inhaler INHALE 2 PUFFS INTO THE LUNGS 2 (TWO) TIMES DAILY. 02/14/18   Mike Gip, FNP    Family History Family  History  Problem Relation Age of Onset  . Hypertension Mother   . Stroke Sister   . Seizures Sister     Social History Social History   Tobacco Use  . Smoking status: Former Smoker    Packs/day: 0.75    Types: Cigarettes    Last attempt to quit: 11/17/1988    Years since quitting: 29.3  . Smokeless tobacco: Never Used  Substance Use Topics  . Alcohol use: No  . Drug use: No     Allergies   Mobic [meloxicam] and Shrimp [shellfish allergy]   Review of Systems Review of Systems  Constitutional: Negative for fever.  HENT: Positive for congestion. Negative for rhinorrhea and sore throat.   Eyes: Negative for redness.  Respiratory: Positive for cough and wheezing.   Cardiovascular: Negative for chest pain and leg swelling.    Gastrointestinal: Positive for nausea and vomiting (post-tussive). Negative for abdominal pain and diarrhea.  Genitourinary: Negative for dysuria.  Musculoskeletal: Negative for myalgias.  Skin: Negative for rash.  Neurological: Negative for headaches.     Physical Exam Updated Vital Signs BP (!) 156/111 (BP Location: Right Arm)   Pulse 77   Temp 99.3 F (37.4 C) (Oral)   Resp 16   LMP 03/12/2018   SpO2 99%   Physical Exam  Constitutional: She appears well-developed and well-nourished.  HENT:  Head: Normocephalic and atraumatic.  Right Ear: Tympanic membrane, external ear and ear canal normal.  Left Ear: Tympanic membrane, external ear and ear canal normal.  Nose: Mucosal edema present.  Mouth/Throat: Oropharynx is clear and moist. No oropharyngeal exudate, posterior oropharyngeal edema or posterior oropharyngeal erythema.  Eyes: Conjunctivae are normal. Right eye exhibits no discharge. Left eye exhibits no discharge.  Neck: Normal range of motion. Neck supple.  Cardiovascular: Normal rate, regular rhythm and normal heart sounds.  Pulmonary/Chest: Effort normal. She has wheezes (Mild to moderate, scattered, expiratory).  Abdominal: Soft. There is no tenderness. There is no rebound and no guarding.  Neurological: She is alert.  Skin: Skin is warm and dry.  Psychiatric: She has a normal mood and affect.  Nursing note and vitals reviewed.    ED Treatments / Results  Labs (all labs ordered are listed, but only abnormal results are displayed) Labs Reviewed - No data to display  ED ECG REPORT   Date: 04/02/2018  Rate: 83  Rhythm: normal sinus rhythm  QRS Axis: left  Intervals: normal  ST/T Wave abnormalities: nonspecific T wave changes  Conduction Disutrbances:none  Narrative Interpretation:   Old EKG Reviewed: unchanged  I have personally reviewed the EKG tracing and agree with the computerized printout as noted.  Radiology Dg Chest Portable 1 View  Result  Date: 04/02/2018 CLINICAL DATA:  Shortness of breath tonight. History of asthma. EXAM: PORTABLE CHEST 1 VIEW COMPARISON:  01/15/2018 FINDINGS: Shallow inspiration with probable linear atelectasis in the lung bases. No airspace disease or consolidation. No blunting of costophrenic angles. No pneumothorax. Mediastinal contours appear intact. Heart size and pulmonary vascularity are normal. IMPRESSION: Shallow inspiration with linear atelectasis in the lung bases. Electronically Signed   By: Burman Nieves M.D.   On: 04/02/2018 06:23    Procedures Procedures (including critical care time)  Medications Ordered in ED Medications  albuterol (PROVENTIL) (2.5 MG/3ML) 0.083% nebulizer solution 5 mg (5 mg Nebulization Given by Other 04/02/18 0551)  albuterol (PROVENTIL) (2.5 MG/3ML) 0.083% nebulizer solution 5 mg (5 mg Nebulization Given 04/02/18 0602)  albuterol (PROVENTIL) (2.5 MG/3ML) 0.083% nebulizer solution 5  mg (5 mg Nebulization Given 04/02/18 0703)  ipratropium (ATROVENT) nebulizer solution 0.5 mg (0.5 mg Nebulization Given 04/02/18 0703)  predniSONE (DELTASONE) tablet 60 mg (60 mg Oral Given 04/02/18 0701)  acetaminophen (TYLENOL) tablet 650 mg (650 mg Oral Given 04/02/18 0937)  benzonatate (TESSALON) capsule 100 mg (100 mg Oral Given 04/02/18 0938)  albuterol (PROVENTIL) (2.5 MG/3ML) 0.083% nebulizer solution 5 mg (5 mg Nebulization Given 04/02/18 0939)     Initial Impression / Assessment and Plan / ED Course  I have reviewed the triage vital signs and the nursing notes.  Pertinent labs & imaging results that were available during my care of the patient were reviewed by me and considered in my medical decision making (see chart for details).     Patient seen and examined.  Additional breathing treatment ordered.  Will hold on labs at this time.  She may need them if she does not improve.  EKG reviewed.   Vital signs reviewed and are as follows: BP (!) 156/111 (BP Location: Right Arm)   Pulse 77    Temp 99.3 F (37.4 C) (Oral)   Resp 16   LMP 03/12/2018   SpO2 99%   7:59 AM Wheezing resolved after treatment. Pt up on side of bed. States she still feels about the same. O2 sat 94-96%. Will ambulate and ensure no desaturation.   9:21 AM Pt rechecked. Additional wheezing noted, however no respiratory distress.  Will give additional breathing treatment.  Patient request medication for cough and headache.  Tylenol and Tessalon ordered.  Anticipate discharge to home after treatment.  10:27 AM patient rechecked.  She is improved.  We will discharged home with prednisone, Tessalon, additional albuterol for her nebulizer machine.  Encouraged use of nebulizer every 4 hours for the next 24 hours.  Encourage PCP follow-up in 2 days.  Patient encouraged to return to the emergency department with uncontrolled symptoms, worsening shortness of breath, fevers, new symptoms or other concerns.  Patient verbalizes understanding and agrees with this plan.  Final Clinical Impressions(s) / ED Diagnoses   Final diagnoses:  Mild persistent asthma with exacerbation  Upper respiratory tract infection, unspecified type   Patient with asthma exacerbation in setting of mild upper respiratory tract infection.  Checks x-ray performed today and is negative for pneumonia.  No indications for antibiotics at this time.  Patient has ambulated without desaturation and her oxygen level.  No persistent tachycardia.  No signs or symptoms of heart failure, DVT/PE, ACS.  ED Discharge Orders         Ordered    predniSONE (DELTASONE) 20 MG tablet  Daily     04/02/18 1026    benzonatate (TESSALON) 100 MG capsule  Every 8 hours     04/02/18 1026    albuterol (PROVENTIL) (5 MG/ML) 0.5% nebulizer solution  Every 6 hours PRN     04/02/18 1026           Renne Crigler, PA-C 04/02/18 1030    Horton, Mayer Masker, MD 04/05/18 2316

## 2018-04-02 NOTE — ED Notes (Signed)
O2 @ 97 while ambulating

## 2018-04-02 NOTE — Discharge Instructions (Signed)
Please read and follow all provided instructions.  Your diagnoses today include:  1. Mild persistent asthma with exacerbation   2. Upper respiratory tract infection, unspecified type     Tests performed today include:  Chest x-ray - no pneumonia  Vital signs. See below for your results today.   Medications prescribed:   Prednisone - steroid medicine   It is best to take this medication in the morning to prevent sleeping problems. If you are diabetic, monitor your blood sugar closely and stop taking Prednisone if blood sugar is over 300. Take with food to prevent stomach upset.    Albuterol inhaler - medication that opens up your airway  Use inhaler as follows: 1-2 puffs with spacer every 4 hours as needed for wheezing, cough, or shortness of breath.    Tessalon Perles - cough suppressant medication  Take any prescribed medications only as directed.  Home care instructions:  Follow any educational materials contained in this packet.  Follow-up instructions: Please follow-up with your primary care provider in the next 2 days for further evaluation of your symptoms and management of your asthma.  Return instructions:   Please return to the Emergency Department if you experience worsening symptoms.  Please return with worsening wheezing, shortness of breath, or difficulty breathing.  Return with persistent fever above 101F.   Please return if you have any other emergent concerns.  Additional Information:  Your vital signs today were: BP (!) 172/104    Pulse 87    Temp 99.3 F (37.4 C) (Oral)    Resp 19    LMP 03/12/2018    SpO2 96%  If your blood pressure (BP) was elevated above 135/85 this visit, please have this repeated by your doctor within one month. --------------

## 2018-04-03 ENCOUNTER — Encounter: Payer: Self-pay | Admitting: Podiatry

## 2018-04-06 ENCOUNTER — Emergency Department (HOSPITAL_COMMUNITY): Payer: BLUE CROSS/BLUE SHIELD

## 2018-04-06 ENCOUNTER — Observation Stay (HOSPITAL_COMMUNITY)
Admission: EM | Admit: 2018-04-06 | Discharge: 2018-04-07 | Disposition: A | Discharge status: Home / Self Care | Payer: BLUE CROSS/BLUE SHIELD | Attending: Internal Medicine | Admitting: Internal Medicine

## 2018-04-06 ENCOUNTER — Encounter (HOSPITAL_COMMUNITY): Payer: Self-pay | Admitting: *Deleted

## 2018-04-06 ENCOUNTER — Other Ambulatory Visit: Payer: Self-pay

## 2018-04-06 DIAGNOSIS — Z87891 Personal history of nicotine dependence: Secondary | ICD-10-CM | POA: Insufficient documentation

## 2018-04-06 DIAGNOSIS — Z79899 Other long term (current) drug therapy: Secondary | ICD-10-CM | POA: Insufficient documentation

## 2018-04-06 DIAGNOSIS — J4531 Mild persistent asthma with (acute) exacerbation: Secondary | ICD-10-CM | POA: Diagnosis present

## 2018-04-06 DIAGNOSIS — I10 Essential (primary) hypertension: Secondary | ICD-10-CM | POA: Diagnosis not present

## 2018-04-06 DIAGNOSIS — D649 Anemia, unspecified: Secondary | ICD-10-CM | POA: Diagnosis present

## 2018-04-06 DIAGNOSIS — J45901 Unspecified asthma with (acute) exacerbation: Secondary | ICD-10-CM | POA: Diagnosis present

## 2018-04-06 DIAGNOSIS — Z888 Allergy status to other drugs, medicaments and biological substances status: Secondary | ICD-10-CM | POA: Diagnosis not present

## 2018-04-06 DIAGNOSIS — J4541 Moderate persistent asthma with (acute) exacerbation: Secondary | ICD-10-CM | POA: Diagnosis not present

## 2018-04-06 LAB — CBC WITH DIFFERENTIAL/PLATELET
Abs Immature Granulocytes: 0 10*3/uL (ref 0.0–0.1)
Basophils Absolute: 0 10*3/uL (ref 0.0–0.1)
Basophils Relative: 0 %
EOS ABS: 0.3 10*3/uL (ref 0.0–0.7)
Eosinophils Relative: 4 %
HEMATOCRIT: 37.5 % (ref 36.0–46.0)
HEMOGLOBIN: 11.9 g/dL — AB (ref 12.0–15.0)
Immature Granulocytes: 0 %
LYMPHS ABS: 2.5 10*3/uL (ref 0.7–4.0)
LYMPHS PCT: 38 %
MCH: 30.1 pg (ref 26.0–34.0)
MCHC: 31.7 g/dL (ref 30.0–36.0)
MCV: 94.9 fL (ref 78.0–100.0)
MONO ABS: 0.5 10*3/uL (ref 0.1–1.0)
Monocytes Relative: 8 %
Neutro Abs: 3.2 10*3/uL (ref 1.7–7.7)
Neutrophils Relative %: 50 %
Platelets: 188 10*3/uL (ref 150–400)
RBC: 3.95 MIL/uL (ref 3.87–5.11)
RDW: 13.2 % (ref 11.5–15.5)
WBC: 6.5 10*3/uL (ref 4.0–10.5)

## 2018-04-06 LAB — RESPIRATORY PANEL BY PCR
Adenovirus: NOT DETECTED
BORDETELLA PERTUSSIS-RVPCR: NOT DETECTED
CHLAMYDOPHILA PNEUMONIAE-RVPPCR: NOT DETECTED
CORONAVIRUS 229E-RVPPCR: NOT DETECTED
Coronavirus HKU1: NOT DETECTED
Coronavirus NL63: NOT DETECTED
Coronavirus OC43: NOT DETECTED
Influenza A: NOT DETECTED
Influenza B: NOT DETECTED
MYCOPLASMA PNEUMONIAE-RVPPCR: NOT DETECTED
Metapneumovirus: NOT DETECTED
PARAINFLUENZA VIRUS 1-RVPPCR: NOT DETECTED
Parainfluenza Virus 2: NOT DETECTED
Parainfluenza Virus 3: NOT DETECTED
Parainfluenza Virus 4: NOT DETECTED
RESPIRATORY SYNCYTIAL VIRUS-RVPPCR: NOT DETECTED
Rhinovirus / Enterovirus: NOT DETECTED

## 2018-04-06 LAB — HIV ANTIBODY (ROUTINE TESTING W REFLEX): HIV SCREEN 4TH GENERATION: NONREACTIVE

## 2018-04-06 LAB — BASIC METABOLIC PANEL
Anion gap: 9 (ref 5–15)
BUN: 5 mg/dL — AB (ref 6–20)
CALCIUM: 8.6 mg/dL — AB (ref 8.9–10.3)
CO2: 25 mmol/L (ref 22–32)
CREATININE: 0.63 mg/dL (ref 0.44–1.00)
Chloride: 105 mmol/L (ref 98–111)
GFR calc Af Amer: >60 mL/min (ref >60)
GFR calc non Af Amer: >60 mL/min (ref >60)
Glucose, Bld: 115 mg/dL — ABNORMAL HIGH (ref 70–99)
Potassium: 3.6 mmol/L (ref 3.5–5.1)
SODIUM: 139 mmol/L (ref 135–145)

## 2018-04-06 LAB — HCG, QUANTITATIVE, PREGNANCY: hCG, Beta Chain, Quant, S: 4 m[IU]/mL (ref <5)

## 2018-04-06 MED ORDER — AZITHROMYCIN 500 MG PO TABS
500.0000 mg | ORAL_TABLET | Freq: Every day | ORAL | Status: AC
  Administered 2018-04-06: 500 mg via ORAL
  Filled 2018-04-06: qty 1

## 2018-04-06 MED ORDER — IPRATROPIUM-ALBUTEROL 0.5-2.5 (3) MG/3ML IN SOLN
3.0000 mL | RESPIRATORY_TRACT | Status: DC
  Administered 2018-04-06: 3 mL via RESPIRATORY_TRACT
  Filled 2018-04-06: qty 3

## 2018-04-06 MED ORDER — ONDANSETRON HCL 4 MG/2ML IJ SOLN: 4.0000 mg | Freq: Three times a day (TID) | INTRAMUSCULAR | Status: DC | PRN

## 2018-04-06 MED ORDER — METHYLPREDNISOLONE SODIUM SUCC 40 MG IJ SOLR
40.0000 mg | Freq: Three times a day (TID) | INTRAMUSCULAR | Status: DC
  Administered 2018-04-06 – 2018-04-07 (×4): 40 mg via INTRAVENOUS
  Filled 2018-04-06 (×4): qty 1

## 2018-04-06 MED ORDER — IPRATROPIUM-ALBUTEROL 0.5-2.5 (3) MG/3ML IN SOLN
3.0000 mL | Freq: Two times a day (BID) | RESPIRATORY_TRACT | Status: DC
  Administered 2018-04-06: 3 mL via RESPIRATORY_TRACT
  Filled 2018-04-06 (×2): qty 3

## 2018-04-06 MED ORDER — ALBUTEROL SULFATE (2.5 MG/3ML) 0.083% IN NEBU: 2.5000 mg | INHALATION_SOLUTION | RESPIRATORY_TRACT | Status: DC | PRN

## 2018-04-06 MED ORDER — ZOLPIDEM TARTRATE 5 MG PO TABS
5.0000 mg | ORAL_TABLET | Freq: Every evening | ORAL | Status: DC | PRN
  Administered 2018-04-06: 5 mg via ORAL
  Filled 2018-04-06: qty 1

## 2018-04-06 MED ORDER — IPRATROPIUM-ALBUTEROL 0.5-2.5 (3) MG/3ML IN SOLN
3.0000 mL | Freq: Once | RESPIRATORY_TRACT | Status: AC
  Administered 2018-04-06: 3 mL via RESPIRATORY_TRACT
  Filled 2018-04-06: qty 3

## 2018-04-06 MED ORDER — DIPHENHYDRAMINE HCL 50 MG/ML IJ SOLN
25.0000 mg | Freq: Once | INTRAMUSCULAR | Status: AC
  Administered 2018-04-06: 25 mg via INTRAVENOUS
  Filled 2018-04-06: qty 1

## 2018-04-06 MED ORDER — CLONAZEPAM 0.5 MG PO TABS
0.5000 mg | ORAL_TABLET | Freq: Once | ORAL | Status: AC
  Administered 2018-04-06: 0.5 mg via ORAL
  Filled 2018-04-06: qty 1

## 2018-04-06 MED ORDER — IPRATROPIUM BROMIDE 0.02 % IN SOLN
0.5000 mg | Freq: Once | RESPIRATORY_TRACT | Status: AC
Start: 2018-04-06 — End: 2018-04-06
  Administered 2018-04-06: 0.5 mg via RESPIRATORY_TRACT

## 2018-04-06 MED ORDER — MELATONIN 3 MG PO TABS
6.0000 mg | ORAL_TABLET | Freq: Every day | ORAL | Status: DC
  Administered 2018-04-06: 6 mg via ORAL
  Filled 2018-04-06: qty 2

## 2018-04-06 MED ORDER — DEXAMETHASONE SODIUM PHOSPHATE 10 MG/ML IJ SOLN
10.0000 mg | Freq: Once | INTRAMUSCULAR | Status: AC
  Administered 2018-04-06: 10 mg via INTRAVENOUS
  Filled 2018-04-06: qty 1

## 2018-04-06 MED ORDER — AZITHROMYCIN 500 MG PO TABS
250.0000 mg | ORAL_TABLET | Freq: Every day | ORAL | Status: DC
  Administered 2018-04-07: 250 mg via ORAL
  Filled 2018-04-06: qty 1

## 2018-04-06 MED ORDER — AMLODIPINE BESYLATE 5 MG PO TABS
5.0000 mg | ORAL_TABLET | Freq: Every day | ORAL | Status: DC
  Administered 2018-04-06 – 2018-04-07 (×2): 5 mg via ORAL
  Filled 2018-04-06 (×2): qty 1

## 2018-04-06 MED ORDER — DM-GUAIFENESIN ER 30-600 MG PO TB12
1.0000 | ORAL_TABLET | Freq: Two times a day (BID) | ORAL | Status: DC | PRN
  Administered 2018-04-06: 1 via ORAL
  Filled 2018-04-06: qty 1

## 2018-04-06 MED ORDER — ACETAMINOPHEN 325 MG PO TABS
650.0000 mg | ORAL_TABLET | Freq: Four times a day (QID) | ORAL | Status: DC | PRN
  Administered 2018-04-06 (×3): 650 mg via ORAL
  Filled 2018-04-06 (×3): qty 2

## 2018-04-06 MED ORDER — ALBUTEROL SULFATE (2.5 MG/3ML) 0.083% IN NEBU
5.0000 mg | INHALATION_SOLUTION | Freq: Once | RESPIRATORY_TRACT | Status: AC
  Administered 2018-04-06: 5 mg via RESPIRATORY_TRACT

## 2018-04-06 MED ORDER — ENOXAPARIN SODIUM 40 MG/0.4ML ~~LOC~~ SOLN
40.0000 mg | SUBCUTANEOUS | Status: DC
  Administered 2018-04-06: 40 mg via SUBCUTANEOUS
  Filled 2018-04-06: qty 0.4

## 2018-04-06 MED ORDER — ALBUTEROL (5 MG/ML) CONTINUOUS INHALATION SOLN
15.0000 mg/h | INHALATION_SOLUTION | Freq: Once | RESPIRATORY_TRACT | Status: AC
  Administered 2018-04-06: 15 mg/h via RESPIRATORY_TRACT
  Filled 2018-04-06: qty 20

## 2018-04-06 MED ORDER — MAGNESIUM SULFATE IN D5W 1-5 GM/100ML-% IV SOLN
1.0000 g | Freq: Once | INTRAVENOUS | Status: AC
  Administered 2018-04-06: 1 g via INTRAVENOUS
  Filled 2018-04-06: qty 100

## 2018-04-06 MED ORDER — HYDRALAZINE HCL 20 MG/ML IJ SOLN: 5.0000 mg | INTRAMUSCULAR | Status: DC | PRN

## 2018-04-06 MED ORDER — DIPHENHYDRAMINE HCL 25 MG PO CAPS: 25.0000 mg | ORAL_CAPSULE | Freq: Every evening | ORAL | Status: DC | PRN

## 2018-04-06 MED ORDER — MELATONIN 5 MG PO TABS
5.0000 mg | ORAL_TABLET | Freq: Every day | ORAL | Status: DC
  Filled 2018-04-06: qty 1

## 2018-04-06 NOTE — ED Notes (Signed)
While ambulating, pt reported increased generalized weakness, had a coughing spell upon returning to sitting. O2 sat remained at 95% throughout the duration of ambulation, pt reported increased difficulty with movement.

## 2018-04-06 NOTE — H&P (Addendum)
History and Physical    Wendy James STM:[id number] DOB: [birth year] DOA: 04/06/2018  Referring MD/NP/PA:   PCP: Mike Gip, FNP   Patient coming from:  The patient is coming from home.  At baseline, pt is independent for most of ADL.   Chief Complaint: Shortness of breath, cough, wheezing  HPI: Wendy James is a 47 y.o. female with medical history significant of hypertension, asthma, anemia, seasonal allergy, who presents with shortness breath, cough and wheezing.  Patient states that she has been having shortness of breath for about a week, which has progressively worsened in the past several days. It is associated with cough and wheezing.  She coughs up clear, sometimes yellow-colored sputum.  She denies chest pain at rest, but states that coughing induced some chest pain.  She has subjective fever and chills. She was seen and evaluated here on 04/02/18.  She was sent home with prednisone. She states that she continues to have worsening shortness of breath, cough and wheezing. She has taken some over-the-counter cold medications with no relief.  Patient denies nausea, vomiting, diarrhea, abdominal pain, symptoms of UTI or unilateral weakness.  ED Course: pt was found to have WBC 6.5, electrolytes renal function okay, temperature 99.3, heart rate 76, RR 17, oxygen saturation at the lower 90s.  Chest x-ray showed shallow inspiration without infiltration.  Patient was given 10 mg of Decadron in ED. Patient is placed on telemetry bed for observation.  Review of Systems:   General: Has a subjective fevers, chills, no body weight gain, has fatigue HEENT: no blurry vision, hearing changes or sore throat Respiratory:  dyspnea, coughing, wheezing CV: no chest pain, no palpitations GI: no nausea, vomiting, abdominal pain, diarrhea, constipation GU: no dysuria, burning on urination, increased urinary frequency, hematuria  Ext: no leg edema Neuro: no unilateral weakness, numbness, or  tingling, no vision change or hearing loss Skin: no rash, no skin tear. MSK: No muscle spasm, no deformity, no limitation of range of movement in spin Heme: No easy bruising.  Travel history: No recent long distant travel.  Allergy:  Allergies  Allergen Reactions  . Mobic [Meloxicam] Hives    Pt reports she gets real bad hives from Mobic  . Shrimp [Shellfish Allergy] Other (See Comments)    Wheezing.  Patient states she is ok with betadine    Past Medical History:  Diagnosis Date  . 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 Asthma   . Bone spur of foot   . Seasonal allergies     Past Surgical History:  Procedure Laterality Date  . CESAREAN SECTION     x 4  . CHOLECYSTECTOMY    . HYSTEROSCOPY W/D&C N/A 02/18/2015   Procedure: DILATATION AND CURETTAGE /HYSTEROSCOPY;  Surgeon: Allie Bossier, MD;  Location: [facility];  Service: Gynecology;  Laterality: N/A;  . TUBAL LIGATION      Social History:  reports that she quit smoking about 29 years ago. Her smoking use included cigarettes. She smoked 0.75 packs per day. She has never used smokeless tobacco. She reports that she does not drink alcohol or use drugs.  Family History:  Family History  Problem Relation Age of Onset  . Hypertension Mother   . Stroke Sister   . Seizures Sister      Prior to Admission medications   Medication Sig Start Date End Date Taking? Authorizing Provider  albuterol (PROVENTIL HFA;VENTOLIN HFA) 108 (90 Base) MCG/ACT inhaler Inhale 1-2 puffs into the lungs every 6 (  six) hours as needed for wheezing or shortness of breath. 02/14/18  Yes Mike Gip, FNP  albuterol (PROVENTIL) (5 MG/ML) 0.5% nebulizer solution Take 0.5 mLs (2.5 mg total) by nebulization every 6 (six) hours as needed for wheezing or shortness of breath. 04/02/18  Yes Renne Crigler, PA-C  amLODipine (NORVASC) 5 MG tablet Take 1 tablet (5 mg total) by mouth daily. 02/14/18  Yes Mike Gip, FNP  benzonatate (TESSALON) 100 MG capsule Take 1 capsule  (100 mg total) by mouth every 8 (eight) hours. 04/02/18  Yes Renne Crigler, PA-C  ibuprofen (ADVIL,MOTRIN) 200 MG tablet Take 400 mg by mouth as needed for moderate pain.   Yes [provider]  SYMBICORT 80-4.5 MCG/ACT inhaler INHALE 2 PUFFS INTO THE LUNGS 2 (TWO) TIMES DAILY. Patient taking differently: Inhale 2 puffs into the lungs 2 (two) times daily.  02/14/18  Yes Mike Gip, FNP    Physical Exam: Vitals:   04/06/18 0045 04/06/18 0100 04/06/18 0101 04/06/18 0115  BP:      Pulse: 76 76  76  Resp: 14 16  17   Temp:      SpO2: 97% 98% 100% 100%  Weight:      Height:       General: Not in acute distress HEENT:       Eyes: PERRL, EOMI, no scleral icterus.       ENT: No discharge from the ears and nose, no pharynx injection, no tonsillar enlargement.        Neck: No JVD, no bruit, no mass felt. Heme: No neck lymph node enlargement. Cardiac: S1/S2, RRR, No murmurs, No gallops or rubs. Respiratory: Has decreased air movement and wheezing bilaterally. GI: Soft, nondistended, nontender, no rebound pain, no organomegaly, BS present. GU: No hematuria Ext: No pitting leg edema bilaterally. 2+DP/PT pulse bilaterally. Musculoskeletal: No joint deformities, No joint redness or warmth, no limitation of ROM in spin. Skin: No rashes.  Neuro: Alert, oriented X3, cranial nerves II-XII grossly intact, moves all extremities normally. Psych: Patient is not psychotic, no suicidal or hemocidal ideation.  Labs on Admission: I have personally reviewed following labs and imaging studies  CBC: Recent Labs  Lab 04/06/18 0047  WBC 6.5  NEUTROABS 3.2  HGB 11.9*  HCT 37.5  MCV 94.9  PLT 188   Basic Metabolic Panel: Recent Labs  Lab 04/06/18 0047  NA 139  K 3.6  CL 105  CO2 25  GLUCOSE 115*  BUN 5*  CREATININE 0.63  CALCIUM 8.6*   GFR: Estimated Creatinine Clearance: 90.2 mL/min (by C-G formula based on SCr of 0.63 mg/dL). Liver Function Tests: No results for input(s): AST,  ALT, ALKPHOS, BILITOT, PROT, ALBUMIN in the last 168 hours. No results for input(s): LIPASE, AMYLASE in the last 168 hours. No results for input(s): AMMONIA in the last 168 hours. Coagulation Profile: No results for input(s): INR, PROTIME in the last 168 hours. Cardiac Enzymes: No results for input(s): CKTOTAL, CKMB, CKMBINDEX, TROPONINI in the last 168 hours. BNP (last 3 results) No results for input(s): PROBNP in the last 8760 hours. HbA1C: No results for input(s): HGBA1C in the last 72 hours. CBG: No results for input(s): GLUCAP in the last 168 hours. Lipid Profile: No results for input(s): CHOL, HDL, LDLCALC, TRIG, CHOLHDL, LDLDIRECT in the last 72 hours. Thyroid Function Tests: No results for input(s): TSH, T4TOTAL, FREET4, T3FREE, THYROIDAB in the last 72 hours. Anemia Panel: No results for input(s): VITAMINB12, FOLATE, FERRITIN, TIBC, IRON, RETICCTPCT in the last 72 hours.  Urine analysis:    Component Value Date/Time   COLORURINE YELLOW 08/23/2016 0758   APPEARANCEUR CLOUDY (A) 08/23/2016 0758   LABSPEC 1.020 08/05/2017 1145   PHURINE 7.0 08/05/2017 1145   GLUCOSEU NEGATIVE 08/05/2017 1145   HGBUR NEGATIVE 08/05/2017 1145   BILIRUBINUR neg 02/14/2018 1502   KETONESUR NEGATIVE 08/05/2017 1145   PROTEINUR Negative 02/14/2018 1502   PROTEINUR NEGATIVE 08/05/2017 1145   UROBILINOGEN 0.2 02/14/2018 1502   UROBILINOGEN 0.2 08/05/2017 1145   NITRITE neg 02/14/2018 1502   NITRITE NEGATIVE 08/05/2017 1145   LEUKOCYTESUR Negative 02/14/2018 1502   Sepsis Labs: @LABRCNTIP (procalcitonin:4,lacticidven:4) )No results found for this or any previous visit (from the past 240 hour(s)).   Radiological Exams on Admission: Dg Chest Portable 1 View  Result Date: 04/06/2018 CLINICAL DATA:  Difficulty breathing since Wednesday. Patient works in dust. Audible wheezes and coughing. EXAM: PORTABLE CHEST 1 VIEW COMPARISON:  04/02/2018 FINDINGS: Heart size and pulmonary vascularity are normal.  Slightly shallow inspiration with linear atelectasis in the lung bases. No airspace disease or consolidation. No blunting of costophrenic angles. No pneumothorax. Mediastinal contours appear intact. IMPRESSION: Shallow inspiration with linear atelectasis in the lung bases. Electronically Signed   By: Burman Nieves M.D.   On: 04/06/2018 00:58     EKG: Independently reviewed.  Sinus rhythm, QTC 426, LAD, low voltage, anteroseptal infarction pattern, poor R wave progression  Assessment/Plan Active Problems:   Acute asthma exacerbation   Anemia of chroic diseae, IDA   Essential hypertension   Acute asthma exacerbation: Failed outpatient steroid treatment.  No infiltration on chest x-ray, but has subjective fever and productive cough.  -will place on tele bed for obs -Nebulizers: scheduled Duoneb and prn albuterol -Give 1 g of magnesium sulfate -Solu-Medrol 40 mg IV tid -Z pak due to subjective fever and productive cough -Mucinex for cough  -Follow up blood culture x2, sputum culture, respiratory virus panel -Nasal cannula oxygen as needed to maintain O2 saturation 93% or greater  Anemia of chroic diseae, IDA: Hgb stable -f/u by CBC  Essential hypertension: -IV hydralazine as needed -Continue home amlodipine    DVT ppx: SQ Lovenox Code Status: Full code Family Communication: None at bed side. Disposition Plan:  Anticipate discharge back to previous home environment Consults called: none Admission status:  medical floor/obs       Date of Service 04/06/2018    Lorretta Harp [facility] Pager [phone]  If 7PM-7AM, please contact night-coverage [email or web address] Password [facility] 04/06/2018, 4:17 AM

## 2018-04-06 NOTE — Progress Notes (Signed)
Patient admitted this morning with hypertension and acute exacerbation of asthma.  She is feeling slightly better but still having some wheezing.  To continue treatment as outlined in H&P.  She is requesting something for sleep will prescribe melatonin 5 mg p.o. nightly.

## 2018-04-06 NOTE — Progress Notes (Addendum)
Pt upset bc she is can not have sleep medication.Pt c/o of head hurting bc she has not slept in the past "2 days".  Explained to the pt that doctors do not prescribe medication for sleep during the day. Pt states "this is crazy" and that [name] doesn't "do this". I talked to MD Willette Pa in person and explained the pt's situation. MD prescribed PRN Melatonin at bedtime. Pt is still upset and states she has asked family member to bring in "something from home" to help her sleep. Pt educated on hospital policy that she can not take home medications while admitted.   Notified charge RN of pt statement and she talked to the pt. Pt was reassured and pt verbalized understanding.   Notified MD Willette Pa via amion, of pt being anxious over not sleeping for 2 days. MD called back and put in for a one time order for Klonopin.   Wendy James

## 2018-04-06 NOTE — ED Triage Notes (Signed)
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  More difficulty breathing tonight audible wheezes coughing  hhn at home not working

## 2018-04-06 NOTE — ED Provider Notes (Signed)
[facility] EMERGENCY DEPARTMENT Provider Note   CSN: [id number] Arrival date & time: 04/06/18  0004     History   Chief Complaint Chief Complaint  Patient presents with  . Asthma    HPI Wendy James is a 47 y.o. female.  HPI  This is a 47 year old female with a history of asthma who presents with shortness of breath.  Patient reports she has not felt well since Tuesday.  She has had increasing shortness of breath refractory to her albuterol.  She reports a nonproductive cough and chills.  No documented fevers.  She was seen and evaluated here on September 4.  She was sent home with prednisone.  She had improvement of symptoms with DuoNeb.  She reports both prior hospitalizations and to patient.  She states that she feels like her throat feels congested.  She has taken some over-the-counter cold medications with no relief.  Level 5 caveat for acuity of condition  Past Medical History:  Diagnosis Date  . 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 Asthma   . Bone spur of foot   . Seasonal allergies     Patient Active Problem List   Diagnosis Date Noted  . Plantar fasciitis, left 03/11/2018  . Influenza A with respiratory manifestations 09/06/2016  . Prediabetes 05/08/2015  . Essential hypertension 05/08/2015  . Mild intermittent asthma 05/06/2015  . Leukocytosis, steroid induced  04/29/2015  . Steroid-induced hyperglycemia 04/29/2015  . Asthma exacerbation 04/29/2015  . Accelerated hypertension 04/28/2015  . Anemia of chroic diseae, IDA 11/17/2013  . Bronchitis, acute 04/12/2012  . Acute asthma exacerbation 03/12/2012  . Acute respiratory failure (HCC) 03/12/2012    Past Surgical History:  Procedure Laterality Date  . CESAREAN SECTION     x 4  . CHOLECYSTECTOMY    . HYSTEROSCOPY W/D&C N/A 02/18/2015   Procedure: DILATATION AND CURETTAGE /HYSTEROSCOPY;  Surgeon: Allie Bossier, MD;  Location: [facility];  Service: Gynecology;  Laterality: N/A;  . TUBAL  LIGATION       OB History    Gravida  5   Para  4   Term  4   Preterm  0   AB  1   Living  3     SAB  1   TAB  0   Ectopic  0   Multiple  0   Live Births               Home Medications    Prior to Admission medications   Medication Sig Start Date End Date Taking? Authorizing Provider  albuterol (PROVENTIL HFA;VENTOLIN HFA) 108 (90 Base) MCG/ACT inhaler Inhale 1-2 puffs into the lungs every 6 (six) hours as needed for wheezing or shortness of breath. 02/14/18  Yes Mike Gip, FNP  albuterol (PROVENTIL) (5 MG/ML) 0.5% nebulizer solution Take 0.5 mLs (2.5 mg total) by nebulization every 6 (six) hours as needed for wheezing or shortness of breath. 04/02/18  Yes Renne Crigler, PA-C  amLODipine (NORVASC) 5 MG tablet Take 1 tablet (5 mg total) by mouth daily. 02/14/18  Yes Mike Gip, FNP  benzonatate (TESSALON) 100 MG capsule Take 1 capsule (100 mg total) by mouth every 8 (eight) hours. 04/02/18  Yes Renne Crigler, PA-C  ibuprofen (ADVIL,MOTRIN) 200 MG tablet Take 400 mg by mouth as needed for moderate pain.   Yes [provider]  SYMBICORT 80-4.5 MCG/ACT inhaler INHALE 2 PUFFS INTO THE LUNGS 2 (TWO) TIMES DAILY. Patient taking differently: Inhale 2  puffs into the lungs 2 (two) times daily.  02/14/18  Yes Mike Gip, FNP    Family History Family History  Problem Relation Age of Onset  . Hypertension Mother   . Stroke Sister   . Seizures Sister     Social History Social History   Tobacco Use  . Smoking status: Former Smoker    Packs/day: 0.75    Types: Cigarettes    Last attempt to quit: 11/17/1988    Years since quitting: 29.4  . Smokeless tobacco: Never Used  Substance Use Topics  . Alcohol use: No  . Drug use: No     Allergies   Mobic [meloxicam] and Shrimp [shellfish allergy]   Review of Systems Review of Systems  Unable to perform ROS: Acuity of condition  Constitutional: Positive for chills. Negative for fever.  Respiratory:  Positive for shortness of breath.   Cardiovascular: Negative for chest pain and leg swelling.     Physical Exam Updated Vital Signs BP (!) 163/91 (BP Location: Right Arm)   Pulse 76   Temp 99.3 F (37.4 C)   Resp 17   Ht 1.549 m (5\' 1" )   Wt 96.2 kg   LMP 03/12/2018 Comment: pt shielded  SpO2 100%   BMI 40.06 kg/m   Physical Exam  Constitutional: She is oriented to person, place, and time. She appears well-developed and well-nourished.  Overweight  HENT:  Head: Normocephalic and atraumatic.  Eyes: Pupils are equal, round, and reactive to light.  Cardiovascular: Normal rate, regular rhythm and normal heart sounds.  Pulmonary/Chest: She is in respiratory distress. She has wheezes.  Increased respiratory rate, speaking in short sentences, wheezing in all lung fields with fair air movement  Abdominal: Soft. Bowel sounds are normal. There is no tenderness.  Musculoskeletal: She exhibits no edema.  Neurological: She is alert and oriented to person, place, and time.  Skin: Skin is warm and dry.  Psychiatric: She has a normal mood and affect.  Nursing note and vitals reviewed.    ED Treatments / Results  Labs (all labs ordered are listed, but only abnormal results are displayed) Labs Reviewed  CBC WITH DIFFERENTIAL/PLATELET - Abnormal; Notable for the following components:      Result Value   Hemoglobin 11.9 (*)    All other components within normal limits  BASIC METABOLIC PANEL - Abnormal; Notable for the following components:   Glucose, Bld 115 (*)    BUN 5 (*)    Calcium 8.6 (*)    All other components within normal limits    EKG EKG Interpretation  Date/Time:  Sunday April 06 2018 00:19:00 EDT Ventricular Rate:  77 PR Interval:    QRS Duration: 91 QT Interval:  376 QTC Calculation: 426 R Axis:   -62 Text Interpretation:  Sinus rhythm Left anterior fascicular block Anterior infarct, old Minimal ST elevation, lateral leads Baseline wander in lead(s) II aVR  aVF V4 V5 V6 No significant change since last tracing Confirmed by Ross Marcus ([id number]) on 04/06/2018 1:28:59 AM   Radiology Dg Chest Portable 1 View  Result Date: 04/06/2018 CLINICAL DATA:  Difficulty breathing since Wednesday. Patient works in dust. Audible wheezes and coughing. EXAM: PORTABLE CHEST 1 VIEW COMPARISON:  04/02/2018 FINDINGS: Heart size and pulmonary vascularity are normal. Slightly shallow inspiration with linear atelectasis in the lung bases. No airspace disease or consolidation. No blunting of costophrenic angles. No pneumothorax. Mediastinal contours appear intact. IMPRESSION: Shallow inspiration with linear atelectasis in the lung bases. Electronically Signed  By: Burman Nieves M.D.   On: 04/06/2018 00:58    Procedures Procedures (including critical care time)  CRITICAL CARE Performed by: Shon Baton   Total critical care time: 40 minutes  Critical care time was exclusive of separately billable procedures and treating other patients.  Critical care was necessary to treat or prevent imminent or life-threatening deterioration.  Critical care was time spent personally by me on the following activities: development of treatment plan with patient and/or surrogate as well as nursing, discussions with consultants, evaluation of patient's response to treatment, examination of patient, obtaining history from patient or surrogate, ordering and performing treatments and interventions, ordering and review of laboratory studies, ordering and review of radiographic studies, pulse oximetry and re-evaluation of patient's condition.   Medications Ordered in ED Medications  ipratropium-albuterol (DUONEB) 0.5-2.5 (3) MG/3ML nebulizer solution 3 mL (has no administration in time range)  albuterol (PROVENTIL) (2.5 MG/3ML) 0.083% nebulizer solution 5 mg (5 mg Nebulization Given 04/06/18 0025)  ipratropium (ATROVENT) nebulizer solution 0.5 mg (0.5 mg Nebulization Given 04/06/18  0025)  dexamethasone (DECADRON) injection 10 mg (10 mg Intravenous Given 04/06/18 0025)  diphenhydrAMINE (BENADRYL) injection 25 mg (25 mg Intravenous Given 04/06/18 0057)  albuterol (PROVENTIL,VENTOLIN) solution continuous neb (15 mg/hr Nebulization Given by Other 04/06/18 0057)     Initial Impression / Assessment and Plan / ED Course  I have reviewed the triage vital signs and the nursing notes.  Pertinent labs & imaging results that were available during my care of the patient were reviewed by me and considered in my medical decision making (see chart for details).  Clinical Course as of Apr 06 340  Wynelle Link Apr 06, 2018  0234 Improved after continuous DuoNeb; however, O2 sats 91 to 92% on room air.  She appears more comfortable.  Will ambulate with pulse ox.   [CH]  [phone] She was able to ambulate and maintain pulse ox in the low 90s.  However, had it tachypnea, continued wheeze, and coughing spells.  Given recent evaluation and trial of home steroids without improvement, feel she would benefit from hospitalization.   [CH]    Clinical Course User Index [CH] Horton, Mayer Masker, MD    Presents with shortness of breath and cough.  Was seen and evaluated several days ago.  Reports compliance with prednisone at home.  She is overall nontoxic-appearing.  Afebrile.  She does have persistent wheezing with fair air movement and significant cough.  Patient was started on a continuous DuoNeb.  O2 sats in the low 90s.  Chest x-ray shows no evidence of pneumothorax or pneumonia.  EKG is without ischemia.  On recheck after 1 hour continuous DuoNeb, patient is resting comfortably.  She does continue to have some wheeze.  See clinical course above.  While she is not hypoxic, she continues to have significant wheeze and coughing spells.  Feel she would benefit from hospitalization and frequent nebulization.  She was given IM Decadron.  Final Clinical Impressions(s) / ED Diagnoses   Final diagnoses:  Mild  persistent asthma with exacerbation    ED Discharge Orders    None       Shon Baton, MD 04/06/18 [phone]

## 2018-04-07 ENCOUNTER — Encounter (HOSPITAL_COMMUNITY): Payer: Self-pay

## 2018-04-07 ENCOUNTER — Encounter (HOSPITAL_COMMUNITY): Payer: Self-pay | Admitting: Internal Medicine

## 2018-04-07 DIAGNOSIS — J4541 Moderate persistent asthma with (acute) exacerbation: Secondary | ICD-10-CM

## 2018-04-07 DIAGNOSIS — I10 Essential (primary) hypertension: Secondary | ICD-10-CM | POA: Diagnosis not present

## 2018-04-07 MED ORDER — PANTOPRAZOLE SODIUM 20 MG PO TBEC: 20.0000 mg | DELAYED_RELEASE_TABLET | Freq: Every day | ORAL | 1 refills | Status: DC

## 2018-04-07 MED ORDER — DM-GUAIFENESIN ER 30-600 MG PO TB12: 1.0000 | ORAL_TABLET | Freq: Two times a day (BID) | ORAL | 0 refills | Status: AC | PRN

## 2018-04-07 MED ORDER — AZITHROMYCIN 250 MG PO TABS: 250.0000 mg | ORAL_TABLET | Freq: Every day | ORAL | 0 refills | Status: AC

## 2018-04-07 MED ORDER — PREDNISONE 20 MG PO TABS
40.0000 mg | ORAL_TABLET | Freq: Every day | ORAL | Status: DC
  Administered 2018-04-07: 40 mg via ORAL
  Filled 2018-04-07: qty 2

## 2018-04-07 MED ORDER — PREDNISONE 20 MG PO TABS: 40.0000 mg | ORAL_TABLET | Freq: Every day | ORAL | 0 refills | Status: AC

## 2018-04-07 NOTE — Discharge Summary (Signed)
Physician Discharge Summary  Wendy James ZOX:[id number] DOB: 12-07-1970 DOA: 04/06/2018  PCP: Mike Gip, FNP  Admit date: 04/06/2018 Discharge date: 04/07/2018  Admitted From: Home Disposition: Home  Recommendations for Outpatient Follow-up:  1. Follow up with PCP Mike Gip, NP in 3 to 4 days 2. Please obtain referral from Mr. Douglas for evaluation by pulmonary medicine regarding your asthma 3. PCP please evaluate patient for gastroesophageal reflux disease worsening her asthma.  I have started her on a proton pump inhibitor at discharge. 4. Check blood pressure at follow-up appointment and consider adding hydrochlorothiazide or an additional blood pressure medication if patient's blood pressure remains elevated when she is off prednisone.  Home Health: No Equipment/Devices:   Discharge Condition: Stable/improved CODE STATUS: Full code Diet recommendation: Heart healthy/regular  Brief/Interim Summary: Wendy James is a 47 y.o. female with medical history significant of hypertension, asthma, anemia, seasonal allergy, who presents with shortness breath, cough and wheezing.  Patient states that she has been having shortness of breath for about a week, which has progressively worsened in the past several days. It is associated with cough and wheezing.  She coughs up clear, sometimes yellow-colored sputum.  She denies chest pain at rest, but states that coughing induced some chest pain.  She has subjective fever and chills.She was seen and evaluated here on 04/02/18. She was sent home with prednisone. She states that she continues to have worsening shortness of breath, cough and wheezing. She has taken some over-the-counter cold medications with no relief.  Patient denies nausea, vomiting, diarrhea, abdominal pain, symptoms of UTI or unilateral weakness.   Patient was placed in observation and Solu-Medrol was continued.  She has improved overnight and has had significant decrease  in baseline shortness of breath at rest wheezing is now absent.  She states she still feels a little wheezy when she ambulates but her sats do not drop.  I am concerned the patient may have some gastroesophageal reflux disease given her body habitus.  This may be bringing on some reflux causing upper airway irritation and possible wheezing.  Have started her on Protonix 20 mg p.o. daily at discharge.  Would like the patient to follow-up with the pulmonary physician in the next 4 to 6 weeks for evaluation of her asthma.  Ask her primary care Mike Gip, NP to arrange that.  Follow-up with Mr. Riley Lam in the next 3 to 4 days.  Also while in the hospital her blood pressure has been elevated.  She did receive her Norvasc.  I recast Mr. Riley Lam check her blood pressure when she is off prednisone to ensure that it has improved if not she may require hydrochlorothiazide or some additional blood pressure management when seen in follow-up.  Patient has reached maximal benefit of hospitalization.  Discharge diagnosis, prognosis, plans, follow-up, medications and treatments discussed with the patient(or responsible party) and is in agreement with the plans as described.  Patient is stable for discharge.  Discharge Diagnoses:  Principal Problem:   Acute asthma exacerbation Active Problems:   Essential hypertension   Anemia of chroic diseae, IDA    Discharge Instructions  Discharge Instructions    Call MD for:  difficulty breathing, headache or visual disturbances   Complete by:  As directed    Diet - low sodium heart healthy   Complete by:  As directed    Discharge instructions   Complete by:  As directed    Complete all antibiotics as prescribed. Complete prednisone prescription as prescribed. Make and  keep a follow-up appointment with Dr. Riley Lam your primary care physician in the next 3 to 4 days. Make and keep a follow-up appointment with pulmonary medicine in the next 4 to 6 weeks (lung  specialist)   Increase activity slowly   Complete by:  As directed      Allergies as of 04/07/2018      Reactions   Mobic [meloxicam] Hives   Pt reports she gets real bad hives from Mobic   Shrimp [shellfish Allergy] Other (See Comments)   Wheezing.  Patient states she is ok with betadine      Medication List    TAKE these medications   albuterol 108 (90 Base) MCG/ACT inhaler Commonly known as:  PROVENTIL HFA;VENTOLIN HFA Inhale 1-2 puffs into the lungs every 6 (six) hours as needed for wheezing or shortness of breath.   albuterol (5 MG/ML) 0.5% nebulizer solution Commonly known as:  PROVENTIL Take 0.5 mLs (2.5 mg total) by nebulization every 6 (six) hours as needed for wheezing or shortness of breath.   amLODipine 5 MG tablet Commonly known as:  NORVASC Take 1 tablet (5 mg total) by mouth daily.   azithromycin 250 MG tablet Commonly known as:  ZITHROMAX Take 1 tablet (250 mg total) by mouth daily for 3 days. Start taking on:  04/08/2018   benzonatate 100 MG capsule Commonly known as:  TESSALON Take 1 capsule (100 mg total) by mouth every 8 (eight) hours.   dextromethorphan-guaiFENesin 30-600 MG 12hr tablet Commonly known as:  MUCINEX DM Take 1 tablet by mouth 2 (two) times daily as needed for up to 5 days for cough.   ibuprofen 200 MG tablet Commonly known as:  ADVIL,MOTRIN Take 400 mg by mouth as needed for moderate pain.   pantoprazole 20 MG tablet Commonly known as:  PROTONIX Take 1 tablet (20 mg total) by mouth daily.   predniSONE 20 MG tablet Commonly known as:  DELTASONE Take 2 tablets (40 mg total) by mouth daily with breakfast for 3 days. Start taking on:  04/08/2018   SYMBICORT 80-4.5 MCG/ACT inhaler Generic drug:  budesonide-formoterol INHALE 2 PUFFS INTO THE LUNGS 2 (TWO) TIMES DAILY. What changed:    how much to take  how to take this  when to take this  additional instructions      Follow-up Information    Mike Gip, FNP. Schedule  an appointment as soon as possible for a visit in 3 day(s).   Specialty:  Family Medicine Why:  Make an appointment to be seen in the next 3 to 4 days Contact information: [address] [place] [address] [phone]          Allergies  Allergen Reactions  . Mobic [Meloxicam] Hives    Pt reports she gets real bad hives from Mobic  . Shrimp [Shellfish Allergy] Other (See Comments)    Wheezing.  Patient states she is ok with betadine    Consultations:  None   Procedures/Studies: Dg Chest Portable 1 View  Result Date: 04/06/2018 CLINICAL DATA:  Difficulty breathing since Wednesday. Patient works in dust. Audible wheezes and coughing. EXAM: PORTABLE CHEST 1 VIEW COMPARISON:  04/02/2018 FINDINGS: Heart size and pulmonary vascularity are normal. Slightly shallow inspiration with linear atelectasis in the lung bases. No airspace disease or consolidation. No blunting of costophrenic angles. No pneumothorax. Mediastinal contours appear intact. IMPRESSION: Shallow inspiration with linear atelectasis in the lung bases. Electronically Signed   By: Burman Nieves M.D.   On:  04/06/2018 00:58   Dg Chest Portable 1 View  Result Date: 04/02/2018 CLINICAL DATA:  Shortness of breath tonight. History of asthma. EXAM: PORTABLE CHEST 1 VIEW COMPARISON:  01/15/2018 FINDINGS: Shallow inspiration with probable linear atelectasis in the lung bases. No airspace disease or consolidation. No blunting of costophrenic angles. No pneumothorax. Mediastinal contours appear intact. Heart size and pulmonary vascularity are normal. IMPRESSION: Shallow inspiration with linear atelectasis in the lung bases. Electronically Signed   By: Burman Nieves M.D.   On: 04/02/2018 06:23     Subjective: Patient feeling better still complains of "Wheeziness" when ambulating but sats are remaining normal.  Discharge Exam: Vitals:   04/07/18 0453 04/07/18 0816  BP: (!) 162/93 (!) 151/96  Pulse: 72 71  Resp: 20  18  Temp: 98.1 F (36.7 C) 98.6 F (37 C)  SpO2: 96% 97%   Vitals:   04/06/18 1726 04/06/18 2053 04/07/18 0453 04/07/18 0816  BP: (!) 155/90 (!) 147/93 (!) 162/93 (!) 151/96  Pulse: 86 80 72 71  Resp: 18 20 20 18   Temp: 97.9 F (36.6 C) 97.9 F (36.6 C) 98.1 F (36.7 C) 98.6 F (37 C)  TempSrc: Oral Oral Oral Oral  SpO2: 98% 93% 96% 97%  Weight:  96.1 kg    Height:        General: Pt is alert, awake, not in acute distress Cardiovascular: RRR, S1/S2 +, no rubs, no gallops Respiratory: CTA bilaterally, no wheezing, no rhonchi, coarse breath sounds Abdominal: Soft, NT, ND, bowel sounds + Extremities: no edema, no cyanosis    The results of significant diagnostics from this hospitalization (including imaging, microbiology, ancillary and laboratory) are listed below for reference.     Microbiology: Recent Results (from the past 240 hour(s))  Respiratory Panel by PCR     Status: None   Collection Time: 04/06/18  4:07 AM  Result Value Ref Range Status   Adenovirus NOT DETECTED NOT DETECTED Final   Coronavirus 229E NOT DETECTED NOT DETECTED Final   Coronavirus HKU1 NOT DETECTED NOT DETECTED Final   Coronavirus NL63 NOT DETECTED NOT DETECTED Final   Coronavirus OC43 NOT DETECTED NOT DETECTED Final   Metapneumovirus NOT DETECTED NOT DETECTED Final   Rhinovirus / Enterovirus NOT DETECTED NOT DETECTED Final   Influenza A NOT DETECTED NOT DETECTED Final   Influenza B NOT DETECTED NOT DETECTED Final   Parainfluenza Virus 1 NOT DETECTED NOT DETECTED Final   Parainfluenza Virus 2 NOT DETECTED NOT DETECTED Final   Parainfluenza Virus 3 NOT DETECTED NOT DETECTED Final   Parainfluenza Virus 4 NOT DETECTED NOT DETECTED Final   Respiratory Syncytial Virus NOT DETECTED NOT DETECTED Final   Bordetella pertussis NOT DETECTED NOT DETECTED Final   Chlamydophila pneumoniae NOT DETECTED NOT DETECTED Final   Mycoplasma pneumoniae NOT DETECTED NOT DETECTED Final    Comment: Performed at  [facility], [address] [place], [place] [address]  Culture, blood (routine x 2) Call MD if unable to obtain prior to antibiotics being given     Status: None (Preliminary result)   Collection Time: 04/06/18  4:12 AM  Result Value Ref Range Status   Specimen Description BLOOD LEFT HAND  Final   Special Requests   Final    BOTTLES DRAWN AEROBIC AND ANAEROBIC Blood Culture adequate volume   Culture   Final    NO GROWTH 1 DAY Performed at [facility], [address], [place], [place] [address]    Report Status PENDING  Incomplete  Culture, blood (routine x 2) Call MD if unable to obtain prior to antibiotics being given     Status: None (Preliminary result)   Collection Time: 04/06/18  4:26 AM  Result Value Ref Range Status   Specimen Description BLOOD RIGHT HAND  Final   Special Requests   Final    BOTTLES DRAWN AEROBIC AND ANAEROBIC Blood Culture results may not be optimal due to an excessive volume of blood received in culture bottles   Culture   Final    NO GROWTH 1 DAY Performed at [facility], [address], [place], [place] [address]    Report Status PENDING  Incomplete     Labs: Basic Metabolic Panel: Recent Labs  Lab 04/06/18 0047  NA 139  K 3.6  CL 105  CO2 25  GLUCOSE 115*  BUN 5*  CREATININE 0.63  CALCIUM 8.6*   CBC: Recent Labs  Lab 04/06/18 0047  WBC 6.5  NEUTROABS 3.2  HGB 11.9*  HCT 37.5  MCV 94.9  PLT 188   Urinalysis    Component Value Date/Time   COLORURINE YELLOW 08/23/2016 0758   APPEARANCEUR CLOUDY (A) 08/23/2016 0758   LABSPEC 1.020 08/05/2017 1145   PHURINE 7.0 08/05/2017 1145   GLUCOSEU NEGATIVE 08/05/2017 1145   HGBUR NEGATIVE 08/05/2017 1145   BILIRUBINUR neg 02/14/2018 1502   KETONESUR NEGATIVE 08/05/2017 1145   PROTEINUR Negative 02/14/2018 1502   PROTEINUR NEGATIVE 08/05/2017 1145   UROBILINOGEN 0.2 02/14/2018 1502   UROBILINOGEN 0.2 08/05/2017 1145   NITRITE neg 02/14/2018 1502   NITRITE NEGATIVE  08/05/2017 1145   LEUKOCYTESUR Negative 02/14/2018 1502   Microbiology Recent Results (from the past 240 hour(s))  Respiratory Panel by PCR     Status: None   Collection Time: 04/06/18  4:07 AM  Result Value Ref Range Status   Adenovirus NOT DETECTED NOT DETECTED Final   Coronavirus 229E NOT DETECTED NOT DETECTED Final   Coronavirus HKU1 NOT DETECTED NOT DETECTED Final   Coronavirus NL63 NOT DETECTED NOT DETECTED Final   Coronavirus OC43 NOT DETECTED NOT DETECTED Final   Metapneumovirus NOT DETECTED NOT DETECTED Final   Rhinovirus / Enterovirus NOT DETECTED NOT DETECTED Final   Influenza A NOT DETECTED NOT DETECTED Final   Influenza B NOT DETECTED NOT DETECTED Final   Parainfluenza Virus 1 NOT DETECTED NOT DETECTED Final   Parainfluenza Virus 2 NOT DETECTED NOT DETECTED Final   Parainfluenza Virus 3 NOT DETECTED NOT DETECTED Final   Parainfluenza Virus 4 NOT DETECTED NOT DETECTED Final   Respiratory Syncytial Virus NOT DETECTED NOT DETECTED Final   Bordetella pertussis NOT DETECTED NOT DETECTED Final   Chlamydophila pneumoniae NOT DETECTED NOT DETECTED Final   Mycoplasma pneumoniae NOT DETECTED NOT DETECTED Final    Comment: Performed at [facility], [address], [place], [place] [address]  Culture, blood (routine x 2) Call MD if unable to obtain prior to antibiotics being given     Status: None (Preliminary result)   Collection Time: 04/06/18  4:12 AM  Result Value Ref Range Status   Specimen Description BLOOD LEFT HAND  Final   Special Requests   Final    BOTTLES DRAWN AEROBIC AND ANAEROBIC Blood Culture adequate volume   Culture   Final    NO GROWTH 1 DAY Performed at [facility], [address] [place], [place] [address]    Report Status PENDING  Incomplete  Culture, blood (routine x 2) Call MD  if unable to obtain prior to antibiotics being given     Status: None (Preliminary result)   Collection Time: 04/06/18  4:26 AM  Result Value Ref Range Status    Specimen Description BLOOD RIGHT HAND  Final   Special Requests   Final    BOTTLES DRAWN AEROBIC AND ANAEROBIC Blood Culture results may not be optimal due to an excessive volume of blood received in culture bottles   Culture   Final    NO GROWTH 1 DAY Performed at [facility], [address], [place], [place] [address]    Report Status PENDING  Incomplete     Time coordinating discharge: 42 minutes  SIGNED:   Lahoma Crocker, MD  FACP [facility] 04/07/2018, 9:35 AM Pager   If 7PM-7AM, please contact night-coverage [email or web address] Password [email or web address]

## 2018-04-07 NOTE — Progress Notes (Signed)
Wendy James to be D/C'd Home per MD order.  Discussed prescriptions and follow up appointments with the patient. Prescriptions given to patient, medication list explained in detail. Pt verbalized understanding.  Allergies as of 04/07/2018      Reactions   Mobic [meloxicam] Hives   Pt reports she gets real bad hives from Mobic   Shrimp [shellfish Allergy] Other (See Comments)   Wheezing.  Patient states she is ok with betadine      Medication List    TAKE these medications   albuterol 108 (90 Base) MCG/ACT inhaler Commonly known as:  PROVENTIL HFA;VENTOLIN HFA Inhale 1-2 puffs into the lungs every 6 (six) hours as needed for wheezing or shortness of breath.   albuterol (5 MG/ML) 0.5% nebulizer solution Commonly known as:  PROVENTIL Take 0.5 mLs (2.5 mg total) by nebulization every 6 (six) hours as needed for wheezing or shortness of breath.   amLODipine 5 MG tablet Commonly known as:  NORVASC Take 1 tablet (5 mg total) by mouth daily.   azithromycin 250 MG tablet Commonly known as:  ZITHROMAX Take 1 tablet (250 mg total) by mouth daily for 3 days. Start taking on:  04/08/2018   benzonatate 100 MG capsule Commonly known as:  TESSALON Take 1 capsule (100 mg total) by mouth every 8 (eight) hours.   dextromethorphan-guaiFENesin 30-600 MG 12hr tablet Commonly known as:  MUCINEX DM Take 1 tablet by mouth 2 (two) times daily as needed for up to 5 days for cough.   ibuprofen 200 MG tablet Commonly known as:  ADVIL,MOTRIN Take 400 mg by mouth as needed for moderate pain.   pantoprazole 20 MG tablet Commonly known as:  PROTONIX Take 1 tablet (20 mg total) by mouth daily.   predniSONE 20 MG tablet Commonly known as:  DELTASONE Take 2 tablets (40 mg total) by mouth daily with breakfast for 3 days. Start taking on:  04/08/2018   SYMBICORT 80-4.5 MCG/ACT inhaler Generic drug:  budesonide-formoterol INHALE 2 PUFFS INTO THE LUNGS 2 (TWO) TIMES DAILY. What changed:    how much  to take  how to take this  when to take this  additional instructions       Vitals:   04/07/18 0453 04/07/18 0816  BP: (!) 162/93 (!) 151/96  Pulse: 72 71  Resp: 20 18  Temp: 98.1 F (36.7 C) 98.6 F (37 C)  SpO2: 96% 97%    Skin clean, dry and intact without evidence of skin break down, no evidence of skin tears noted. IV catheter discontinued intact. Site without signs and symptoms of complications. Dressing and pressure applied. Pt denies pain at this time. No complaints noted.  An After Visit Summary was printed and given to the patient. Patient escorted via WC, and D/C home via private auto.

## 2018-04-07 NOTE — Progress Notes (Signed)
Subjective: 47 year old female presents the office today for follow-up evaluation of left heel pain from plantar fasciitis.  Overall she states that the injection did help mildly but she still getting pain to the heel.  She says the pain is intermittent.  No recent injury or falls.  Overall she has no other concerns today.  Objective: AAO x3, NAD DP/PT pulses palpable bilaterally, CRT less than 3 seconds There is tenderness palpation along the plantar medial tubercle of the calcaneus at the insertion of the plantar fascia on the left foot.  Equinus is present.  There is no pain with lateral compression of calcaneus.  Achilles tendon, plantar fascia appears to be intact.  Negative Tinel sign.  No other areas of tenderness.  No open lesions or pre-ulcerative lesions.  No pain with calf compression, swelling, warmth, erythema  Assessment: Left heel pain, plantar fasciitis  Plan: -All treatment options discussed with the patient including all alternatives, risks, complications.  -Today the Medrol Dosepak was prescribed.  Pulmonary continue stretching, icing exercises daily as well as continuing the night splint.  She is going to see Raiford Noble today for orthotics.  Vivi Barrack DPM

## 2018-04-11 LAB — CULTURE, BLOOD (ROUTINE X 2)
CULTURE: NO GROWTH
Culture: NO GROWTH
Special Requests: ADEQUATE

## 2018-04-29 ENCOUNTER — Ambulatory Visit: Payer: BLUE CROSS/BLUE SHIELD | Admitting: Podiatry

## 2018-05-02 MED FILL — !VENTOLIN HFA INHALER: 108 (90 BAS | 25 days supply | Qty: 18 | Fill #1

## 2018-05-19 ENCOUNTER — Ambulatory Visit: Payer: BLUE CROSS/BLUE SHIELD | Admitting: Family Medicine

## 2018-05-20 ENCOUNTER — Telehealth: Payer: Self-pay

## 2018-05-20 NOTE — Telephone Encounter (Signed)
Called to verify appointment for 05/21/2018. NO answer left a message of appointment time and if unable to get to call back to reschedule. Thanks!  

## 2018-05-21 ENCOUNTER — Ambulatory Visit (INDEPENDENT_AMBULATORY_CARE_PROVIDER_SITE_OTHER): Payer: BLUE CROSS/BLUE SHIELD | Admitting: Family Medicine

## 2018-05-21 ENCOUNTER — Encounter: Payer: Self-pay | Admitting: Family Medicine

## 2018-05-21 VITALS — BP 149/95 | HR 69 | Temp 98.8°F | Resp 16 | Ht 62.0 in | Wt 222.0 lb

## 2018-05-21 DIAGNOSIS — N912 Amenorrhea, unspecified: Secondary | ICD-10-CM | POA: Diagnosis not present

## 2018-05-21 DIAGNOSIS — J4520 Mild intermittent asthma, uncomplicated: Secondary | ICD-10-CM

## 2018-05-21 DIAGNOSIS — I10 Essential (primary) hypertension: Secondary | ICD-10-CM

## 2018-05-21 LAB — POCT URINALYSIS DIPSTICK
Bilirubin, UA: NEGATIVE
Blood, UA: NEGATIVE
Glucose, UA: NEGATIVE
Ketones, UA: NEGATIVE
Leukocytes, UA: NEGATIVE
Nitrite, UA: NEGATIVE
Protein, UA: POSITIVE — AB
Spec Grav, UA: 1.025 (ref 1.010–1.025)
Urobilinogen, UA: 0.2 E.U./dL
pH, UA: 6 (ref 5.0–8.0)

## 2018-05-21 LAB — POCT URINE PREGNANCY: Preg Test, Ur: NEGATIVE

## 2018-05-21 MED ORDER — GUAIFENESIN-CODEINE 100-10 MG/5ML PO SOLN: 5.0000 mL | Freq: Every day | ORAL | 0 refills | Status: DC

## 2018-05-21 MED ORDER — IPRATROPIUM-ALBUTEROL 0.5-2.5 (3) MG/3ML IN SOLN: 3.0000 mL | Freq: Four times a day (QID) | RESPIRATORY_TRACT | 11 refills | Status: DC | PRN

## 2018-05-21 MED ORDER — AMLODIPINE BESYLATE 5 MG PO TABS: 5.0000 mg | ORAL_TABLET | Freq: Every day | ORAL | 5 refills | Status: DC

## 2018-05-21 MED ORDER — IPRATROPIUM BROMIDE 0.02 % IN SOLN
0.5000 mg | Freq: Once | RESPIRATORY_TRACT | Status: AC
  Administered 2018-05-21: 0.5 mg via RESPIRATORY_TRACT

## 2018-05-21 MED ORDER — PROMETHAZINE-DM 6.25-15 MG/5ML PO SYRP: 5.0000 mL | ORAL_SOLUTION | Freq: Every evening | ORAL | 0 refills | Status: DC | PRN

## 2018-05-21 MED ORDER — ALBUTEROL SULFATE (2.5 MG/3ML) 0.083% IN NEBU
2.5000 mg | INHALATION_SOLUTION | Freq: Once | RESPIRATORY_TRACT | Status: AC
  Administered 2018-05-21: 2.5 mg via RESPIRATORY_TRACT

## 2018-05-21 NOTE — Patient Instructions (Addendum)
Place appropriate patient instructions regarding hypertension here.  DASH Eating Plan DASH stands for "Dietary Approaches to Stop Hypertension." The DASH eating plan is a healthy eating plan that has been shown to reduce high blood pressure (hypertension). It may also reduce your risk for type 2 diabetes, heart disease, and stroke. The DASH eating plan may also help with weight loss. What are tips for following this plan? General guidelines  Avoid eating more than 2,300 mg (milligrams) of salt (sodium) a day. If you have hypertension, you may need to reduce your sodium intake to 1,500 mg a day.  Limit alcohol intake to no more than 1 drink a day for nonpregnant women and 2 drinks a day for men. One drink equals 12 oz of beer, 5 oz of wine, or 1 oz of hard liquor.  Work with your health care provider to maintain a healthy body weight or to lose weight. Ask what an ideal weight is for you.  Get at least 30 minutes of exercise that causes your heart to beat faster (aerobic exercise) most days of the week. Activities may include walking, swimming, or biking.  Work with your health care provider or diet and nutrition specialist (dietitian) to adjust your eating plan to your individual calorie needs. Reading food labels  Check food labels for the amount of sodium per serving. Choose foods with less than 5 percent of the Daily Value of sodium. Generally, foods with less than 300 mg of sodium per serving fit into this eating plan.  To find whole grains, look for the word "whole" as the first word in the ingredient list. Shopping  Buy products labeled as "low-sodium" or "no salt added."  Buy fresh foods. Avoid canned foods and premade or frozen meals. Cooking  Avoid adding salt when cooking. Use salt-free seasonings or herbs instead of table salt or sea salt. Check with your health care provider or pharmacist before using salt substitutes.  Do not fry foods. Cook foods using healthy methods  such as baking, boiling, grilling, and broiling instead.  Cook with heart-healthy oils, such as olive, canola, soybean, or sunflower oil. Meal planning   Eat a balanced diet that includes: ? 5 or more servings of fruits and vegetables each day. At each meal, try to fill half of your plate with fruits and vegetables. ? Up to 6-8 servings of whole grains each day. ? Less than 6 oz of lean meat, poultry, or fish each day. A 3-oz serving of meat is about the same size as a deck of cards. One egg equals 1 oz. ? 2 servings of low-fat dairy each day. ? A serving of nuts, seeds, or beans 5 times each week. ? Heart-healthy fats. Healthy fats called Omega-3 fatty acids are found in foods such as flaxseeds and coldwater fish, like sardines, salmon, and mackerel.  Limit how much you eat of the following: ? Canned or prepackaged foods. ? Food that is high in trans fat, such as fried foods. ? Food that is high in saturated fat, such as fatty meat. ? Sweets, desserts, sugary drinks, and other foods with added sugar. ? Full-fat dairy products.  Do not salt foods before eating.  Try to eat at least 2 vegetarian meals each week.  Eat more home-cooked food and less restaurant, buffet, and fast food.  When eating at a restaurant, ask that your food be prepared with less salt or no salt, if possible. What foods are recommended? The items listed may not be a  complete list. Talk with your dietitian about what dietary choices are best for you. Grains Whole-grain or whole-wheat bread. Whole-grain or whole-wheat pasta. Brown rice. Orpah Cobb. Bulgur. Whole-grain and low-sodium cereals. Pita bread. Low-fat, low-sodium crackers. Whole-wheat flour tortillas. Vegetables Fresh or frozen vegetables (raw, steamed, roasted, or grilled). Low-sodium or reduced-sodium tomato and vegetable juice. Low-sodium or reduced-sodium tomato sauce and tomato paste. Low-sodium or reduced-sodium canned vegetables. Fruits All  fresh, dried, or frozen fruit. Canned fruit in natural juice (without added sugar). Meat and other protein foods Skinless chicken or [place]. Ground chicken or [place]. Pork with fat trimmed off. Fish and seafood. Egg whites. Dried beans, peas, or lentils. Unsalted nuts, nut butters, and seeds. Unsalted canned beans. Lean cuts of beef with fat trimmed off. Low-sodium, lean deli meat. Dairy Low-fat (1%) or fat-free (skim) milk. Fat-free, low-fat, or reduced-fat cheeses. Nonfat, low-sodium ricotta or cottage cheese. Low-fat or nonfat yogurt. Low-fat, low-sodium cheese. Fats and oils Soft margarine without trans fats. Vegetable oil. Low-fat, reduced-fat, or light mayonnaise and salad dressings (reduced-sodium). Canola, safflower, olive, soybean, and sunflower oils. Avocado. Seasoning and other foods Herbs. Spices. Seasoning mixes without salt. Unsalted popcorn and pretzels. Fat-free sweets. What foods are not recommended? The items listed may not be a complete list. Talk with your dietitian about what dietary choices are best for you. Grains Baked goods made with fat, such as croissants, muffins, or some breads. Dry pasta or rice meal packs. Vegetables Creamed or fried vegetables. Vegetables in a cheese sauce. Regular canned vegetables (not low-sodium or reduced-sodium). Regular canned tomato sauce and paste (not low-sodium or reduced-sodium). Regular tomato and vegetable juice (not low-sodium or reduced-sodium). Rosita Fire. Olives. Fruits Canned fruit in a light or heavy syrup. Fried fruit. Fruit in cream or butter sauce. Meat and other protein foods Fatty cuts of meat. Ribs. Fried meat. Tomasa Blase. Sausage. Bologna and other processed lunch meats. Salami. Fatback. Hotdogs. Bratwurst. Salted nuts and seeds. Canned beans with added salt. Canned or smoked fish. Whole eggs or egg yolks. Chicken or [place] with skin. Dairy Whole or 2% milk, cream, and half-and-half. Whole or full-fat cream cheese. Whole-fat or  sweetened yogurt. Full-fat cheese. Nondairy creamers. Whipped toppings. Processed cheese and cheese spreads. Fats and oils Butter. Stick margarine. Lard. Shortening. Ghee. Bacon fat. Tropical oils, such as coconut, palm kernel, or palm oil. Seasoning and other foods Salted popcorn and pretzels. Onion salt, garlic salt, seasoned salt, table salt, and sea salt. Worcestershire sauce. Tartar sauce. Barbecue sauce. Teriyaki sauce. Soy sauce, including reduced-sodium. Steak sauce. Canned and packaged gravies. Fish sauce. Oyster sauce. Cocktail sauce. Horseradish that you find on the shelf. Ketchup. Mustard. Meat flavorings and tenderizers. Bouillon cubes. Hot sauce and Tabasco sauce. Premade or packaged marinades. Premade or packaged taco seasonings. Relishes. Regular salad dressings. Where to find more information:  National Heart, Lung, and Blood Institute: PopSteam.is  American Heart Association: www.heart.org Summary  The DASH eating plan is a healthy eating plan that has been shown to reduce high blood pressure (hypertension). It may also reduce your risk for type 2 diabetes, heart disease, and stroke.  With the DASH eating plan, you should limit salt (sodium) intake to 2,300 mg a day. If you have hypertension, you may need to reduce your sodium intake to 1,500 mg a day.  When on the DASH eating plan, aim to eat more fresh fruits and vegetables, whole grains, lean proteins, low-fat dairy, and heart-healthy fats.  Work with your health care provider or diet and nutrition specialist (dietitian)  to adjust your eating plan to your individual calorie needs. This information is not intended to replace advice given to you by your health care provider. Make sure you discuss any questions you have with your health care provider. Document Released: 07/05/2011 Document Revised: 07/09/2016 Document Reviewed: 07/09/2016 Elsevier Interactive Patient Education  2018 [employer].   Hypertension Hypertension is another name for high blood pressure. High blood pressure forces your heart to work harder to pump blood. This can cause problems over time. There are two numbers in a blood pressure reading. There is a top number (systolic) over a bottom number (diastolic). It is best to have a blood pressure below 120/80. Healthy choices can help lower your blood pressure. You may need medicine to help lower your blood pressure if:  Your blood pressure cannot be lowered with healthy choices.  Your blood pressure is higher than 130/80.  Follow these instructions at home: Eating and drinking  If directed, follow the DASH eating plan. This diet includes: ? Filling half of your plate at each meal with fruits and vegetables. ? Filling one quarter of your plate at each meal with whole grains. Whole grains include whole wheat pasta, brown rice, and whole grain bread. ? Eating or drinking low-fat dairy products, such as skim milk or low-fat yogurt. ? Filling one quarter of your plate at each meal with low-fat (lean) proteins. Low-fat proteins include fish, skinless chicken, eggs, beans, and tofu. ? Avoiding fatty meat, cured and processed meat, or chicken with skin. ? Avoiding premade or processed food.  Eat less than 1,500 mg of salt (sodium) a day.  Limit alcohol use to no more than 1 drink a day for nonpregnant women and 2 drinks a day for men. One drink equals 12 oz of beer, 5 oz of wine, or 1 oz of hard liquor. Lifestyle  Work with your doctor to stay at a healthy weight or to lose weight. Ask your doctor what the best weight is for you.  Get at least 30 minutes of exercise that causes your heart to beat faster (aerobic exercise) most days of the week. This may include walking, swimming, or biking.  Get at least 30 minutes of exercise that strengthens your muscles (resistance exercise) at least 3 days a week. This may include lifting weights or pilates.  Do not use  any products that contain nicotine or tobacco. This includes cigarettes and e-cigarettes. If you need help quitting, ask your doctor.  Check your blood pressure at home as told by your doctor.  Keep all follow-up visits as told by your doctor. This is important. Medicines  Take over-the-counter and prescription medicines only as told by your doctor. Follow directions carefully.  Do not skip doses of blood pressure medicine. The medicine does not work as well if you skip doses. Skipping doses also puts you at risk for problems.  Ask your doctor about side effects or reactions to medicines that you should watch for. Contact a doctor if:  You think you are having a reaction to the medicine you are taking.  You have headaches that keep coming back (recurring).  You feel dizzy.  You have swelling in your ankles.  You have trouble with your vision. Get help right away if:  You get a very bad headache.  You start to feel confused.  You feel weak or numb.  You feel faint.  You get very bad pain in your: ? Chest. ? Belly (abdomen).  You throw up (  vomit) more than once.  You have trouble breathing. Summary  Hypertension is another name for high blood pressure.  Making healthy choices can help lower blood pressure. If your blood pressure cannot be controlled with healthy choices, you may need to take medicine. This information is not intended to replace advice given to you by your health care provider. Make sure you discuss any questions you have with your health care provider. Document Released: 01/02/2008 Document Revised: 06/13/2016 Document Reviewed: 06/13/2016 Elsevier Interactive Patient Education  [employer].

## 2018-05-21 NOTE — Progress Notes (Signed)
Patient [facility]  Provider: Lanae Boast, FNP   Hypertension Follow Up Visit  SUBJECTIVE:  Wendy James is a 47 y.o. female who  has a past medical history of Arthritis, Asthma, Bone spur of foot, and Seasonal allergies. .  Patient presents for a follow up. Patient states the she has not had a period in 3 months. Would like pregnancy testing.  Patient with hx of asthma. Has cough and chest congestion. Does not have medications for nebulizer machine at the present time. Has increased inhaler use. Coughing keeping patient awake at night.   Current Outpatient Medications  Medication Sig Dispense Refill  . albuterol (PROVENTIL HFA;VENTOLIN HFA) 108 (90 Base) MCG/ACT inhaler Inhale 1-2 puffs into the lungs every 6 (six) hours as needed for wheezing or shortness of breath. 1 Inhaler 5  . albuterol (PROVENTIL) (5 MG/ML) 0.5% nebulizer solution Take 0.5 mLs (2.5 mg total) by nebulization every 6 (six) hours as needed for wheezing or shortness of breath. 20 mL 0  . amLODipine (NORVASC) 5 MG tablet Take 1 tablet (5 mg total) by mouth daily. 30 tablet 5  . ibuprofen (ADVIL,MOTRIN) 200 MG tablet Take 400 mg by mouth as needed for moderate pain.    . SYMBICORT 80-4.5 MCG/ACT inhaler INHALE 2 PUFFS INTO THE LUNGS 2 (TWO) TIMES DAILY. (Patient taking differently: Inhale 2 puffs into the lungs 2 (two) times daily. ) 1 Inhaler 5  . benzonatate (TESSALON) 100 MG capsule Take 1 capsule (100 mg total) by mouth every 8 (eight) hours. (Patient not taking: Reported on 05/21/2018) 15 capsule 0  . pantoprazole (PROTONIX) 20 MG tablet Take 1 tablet (20 mg total) by mouth daily. (Patient not taking: Reported on 05/21/2018) 30 tablet 1   No current facility-administered medications for this visit.     Recent Results (from the past 2160 hour(s))  CBC with Differential     Status: Abnormal   Collection Time: 04/06/18 12:47 AM  Result Value Ref Range   WBC 6.5  4.0 - 10.5 K/uL   RBC 3.95 3.87 - 5.11 MIL/uL   Hemoglobin 11.9 (L) 12.0 - 15.0 g/dL   HCT 37.5 36.0 - 46.0 %   MCV 94.9 78.0 - 100.0 fL   MCH 30.1 26.0 - 34.0 pg   MCHC 31.7 30.0 - 36.0 g/dL   RDW 13.2 11.5 - 15.5 %   Platelets 188 150 - 400 K/uL   Neutrophils Relative % 50 %   Neutro Abs 3.2 1.7 - 7.7 K/uL   Lymphocytes Relative 38 %   Lymphs Abs 2.5 0.7 - 4.0 K/uL   Monocytes Relative 8 %   Monocytes Absolute 0.5 0.1 - 1.0 K/uL   Eosinophils Relative 4 %   Eosinophils Absolute 0.3 0.0 - 0.7 K/uL   Basophils Relative 0 %   Basophils Absolute 0.0 0.0 - 0.1 K/uL   Immature Granulocytes 0 %   Abs Immature Granulocytes 0.0 0.0 - 0.1 K/uL    Comment: Performed at [facility], [address] [place], [place] [address]  Basic metabolic panel     Status: Abnormal   Collection Time: 04/06/18 12:47 AM  Result Value Ref Range   Sodium 139 135 - 145 mmol/L   Potassium 3.6 3.5 - 5.1 mmol/L   Chloride 105 98 - 111 mmol/L   CO2 25 22 - 32 mmol/L   Glucose, Bld 115 (H) 70 - 99 mg/dL   BUN 5 (L) 6 - 20 mg/dL  Creatinine, Ser 0.63 0.44 - 1.00 mg/dL   Calcium 8.6 (L) 8.9 - 10.3 mg/dL   GFR calc non Af Amer >60 >60 mL/min   GFR calc Af Amer >60 >60 mL/min    Comment: (NOTE) The eGFR has been calculated using the CKD EPI equation. This calculation has not been validated in all clinical situations. eGFR's persistently <60 mL/min signify possible Chronic Kidney Disease.    Anion gap 9 5 - 15    Comment: Performed at [facility] [address], [place], [place] [address]  Respiratory Panel by PCR     Status: None   Collection Time: 04/06/18  4:07 AM  Result Value Ref Range   Adenovirus NOT DETECTED NOT DETECTED   Coronavirus 229E NOT DETECTED NOT DETECTED   Coronavirus HKU1 NOT DETECTED NOT DETECTED   Coronavirus NL63 NOT DETECTED NOT DETECTED   Coronavirus OC43 NOT DETECTED NOT DETECTED   Metapneumovirus NOT DETECTED NOT DETECTED   Rhinovirus / Enterovirus NOT DETECTED  NOT DETECTED   Influenza A NOT DETECTED NOT DETECTED   Influenza B NOT DETECTED NOT DETECTED   Parainfluenza Virus 1 NOT DETECTED NOT DETECTED   Parainfluenza Virus 2 NOT DETECTED NOT DETECTED   Parainfluenza Virus 3 NOT DETECTED NOT DETECTED   Parainfluenza Virus 4 NOT DETECTED NOT DETECTED   Respiratory Syncytial Virus NOT DETECTED NOT DETECTED   Bordetella pertussis NOT DETECTED NOT DETECTED   Chlamydophila pneumoniae NOT DETECTED NOT DETECTED   Mycoplasma pneumoniae NOT DETECTED NOT DETECTED    Comment: Performed at [facility] [address] [place], [place] [address]  hCG, quantitative, pregnancy     Status: None   Collection Time: 04/06/18  4:12 AM  Result Value Ref Range   hCG, Beta Chain, Quant, S 4 <5 mIU/mL    Comment:          GEST. AGE      CONC.  (mIU/mL)   <=1 WEEK        5 - 50     2 WEEKS       50 - 500     3 WEEKS       100 - 10,000     4 WEEKS     1,000 - 30,000     5 WEEKS     3,500 - 115,000   6-8 WEEKS     12,000 - 270,000    12 WEEKS     15,000 - 220,000        FEMALE AND NON-PREGNANT FEMALE:     LESS THAN 5 mIU/mL Performed at [facility], [place] [address], [place], [place] [address]   Culture, blood (routine x 2) Call MD if unable to obtain prior to antibiotics being given     Status: None   Collection Time: 04/06/18  4:12 AM  Result Value Ref Range   Specimen Description BLOOD LEFT HAND    Special Requests      BOTTLES DRAWN AEROBIC AND ANAEROBIC Blood Culture adequate volume   Culture      NO GROWTH 5 DAYS Performed at [facility], [place] [address] [place], [place] [address]    Report Status 04/11/2018 FINAL   HIV antibody (Routine Screening)     Status: None   Collection Time: 04/06/18  4:12 AM  Result Value Ref Range   HIV Screen 4th Generation wRfx Non Reactive Non Reactive    Comment: (NOTE) Performed At: [facility] [address]  [place], [place] [id number] Rush Farmer MD [id number]   Culture, blood  (routine x 2) Call MD if unable to obtain prior to antibiotics being given     Status: None   Collection Time: 04/06/18  4:26 AM  Result Value Ref Range   Specimen Description BLOOD RIGHT HAND    Special Requests      BOTTLES DRAWN AEROBIC AND ANAEROBIC Blood Culture results may not be optimal due to an excessive volume of blood received in culture bottles   Culture      NO GROWTH 5 DAYS Performed at [facility] [address] [place], [place] [address]    Report Status 04/11/2018 FINAL     Hypertension ROS: Review of Systems  Constitutional: Negative.   HENT: Negative.   Eyes: Negative.   Respiratory: Positive for cough and wheezing.   Cardiovascular: Negative.   Gastrointestinal: Negative.   Genitourinary: Negative.   Musculoskeletal: Negative.   Skin: Negative.   Neurological: Negative.   Psychiatric/Behavioral: Negative.      OBJECTIVE:   BP (!) 149/95 (BP Location: Right Arm, Patient Position: Sitting, Cuff Size: Large)   Pulse 69   Temp 98.8 F (37.1 C) (Oral)   Resp 16   Ht _0  (1.575 m)   Wt 222 lb (100.7 kg)   SpO2 100%   BMI 40.60 kg/m   Physical Exam  Constitutional: She is oriented to person, place, and time and well-developed, well-nourished, and in no distress. No distress.  HENT:  Head: Normocephalic and atraumatic.  Eyes: Pupils are equal, round, and reactive to light. Conjunctivae and EOM are normal.  Neck: Normal range of motion. Neck supple.  Cardiovascular: Normal rate, regular rhythm and intact distal pulses. Exam reveals no gallop and no friction rub.  No murmur heard. Pulmonary/Chest: Effort normal. No respiratory distress. She has wheezes (throughout all lung fields. ).  Abdominal: Soft. Bowel sounds are normal. There is no tenderness.  Musculoskeletal: Normal range of motion. She exhibits no edema or tenderness.  Lymphadenopathy:    She has no cervical adenopathy.  Neurological: She is alert and oriented to person, place, and  time. Gait normal.  Skin: Skin is warm and dry.  Psychiatric: Mood, memory, affect and judgment normal.  Nursing note and vitals reviewed.    ASSESSMENT/PLAN:   1. Essential hypertension Pending labs. Will adjust medications accordingly.     - Comprehensive metabolic panel - CBC - Urinalysis Dipstick - amLODipine (NORVASC) 5 MG tablet; Take 1 tablet (5 mg total) by mouth daily.  Dispense: 30 tablet; Refill: 5 - Comprehensive metabolic panel  2. Amenorrhea Discussed possibility of menopause. Urine HCG is negative.  - POCT urine pregnancy  3. Mild intermittent asthma without complication Neb treatment in the office today. Medications ordered for home use. Mucinex and increase in water recommended to thin mucus.  - albuterol (PROVENTIL) (2.5 MG/3ML) 0.083% nebulizer solution 2.5 mg - ipratropium (ATROVENT) nebulizer solution 0.5 mg - ipratropium-albuterol (DUONEB) 0.5-2.5 (3) MG/3ML SOLN; Take 3 mLs by nebulization every 6 (six) hours as needed.  Dispense: 360 mL; Refill: 11 - guaiFENesin-codeine 100-10 MG/5ML syrup; Take 5 mLs by mouth at bedtime.  Dispense: 120 mL; Refill: 0    The patient is asked to make an attempt to improve diet and exercise patterns to aid in medical management of this problem.  Return to care as scheduled and prn. Patient verbalized understanding and agreed with plan of care.    Ms. Doug Sou. Nathaneil Canary, FNP-BC Patient  [facility] [address] [place], [place] [address] [phone]

## 2018-05-22 LAB — COMPREHENSIVE METABOLIC PANEL
ALT: 38 IU/L — ABNORMAL HIGH (ref 0–32)
AST: 26 IU/L (ref 0–40)
Albumin/Globulin Ratio: 1.6 (ref 1.2–2.2)
Albumin: 4.1 g/dL (ref 3.5–5.5)
Alkaline Phosphatase: 77 IU/L (ref 39–117)
BUN/Creatinine Ratio: 15 (ref 9–23)
BUN: 12 mg/dL (ref 6–24)
Bilirubin Total: 0.4 mg/dL (ref 0.0–1.2)
CO2: 25 mmol/L (ref 20–29)
Calcium: 9.1 mg/dL (ref 8.7–10.2)
Chloride: 105 mmol/L (ref 96–106)
Creatinine, Ser: 0.78 mg/dL (ref 0.57–1.00)
GFR calc Af Amer: 103 mL/min/{1.73_m2} (ref >59)
GFR calc non Af Amer: 90 mL/min/{1.73_m2} (ref >59)
Globulin, Total: 2.5 g/dL (ref 1.5–4.5)
Glucose: 95 mg/dL (ref 65–99)
Potassium: 3.8 mmol/L (ref 3.5–5.2)
Sodium: 144 mmol/L (ref 134–144)
Total Protein: 6.6 g/dL (ref 6.0–8.5)

## 2018-05-22 LAB — CBC
Hematocrit: 37.4 % (ref 34.0–46.6)
Hemoglobin: 12.2 g/dL (ref 11.1–15.9)
MCH: 29.3 pg (ref 26.6–33.0)
MCHC: 32.6 g/dL (ref 31.5–35.7)
MCV: 90 fL (ref 79–97)
Platelets: 225 10*3/uL (ref 150–450)
RBC: 4.16 x10E6/uL (ref 3.77–5.28)
RDW: 12.7 % (ref 12.3–15.4)
WBC: 4.6 10*3/uL (ref 3.4–10.8)

## 2018-06-02 MED FILL — IPRAT-ALBUT 0.5-3(2.5) MG/3: 0.5-2.5 (3) | 15 days supply | Qty: 180 | Fill #0

## 2018-06-02 MED FILL — ALBUTEROL SULFATE HFA 108 (: 108 (90 BAS | 25 days supply | Qty: 18 | Fill #2

## 2018-06-02 MED FILL — AMLODIPINE BESYLATE 5 MG TA: 5 | 30 days supply | Qty: 30 | Fill #1

## 2018-06-09 ENCOUNTER — Encounter: Payer: Self-pay | Admitting: Podiatry

## 2018-06-09 ENCOUNTER — Ambulatory Visit: Payer: BLUE CROSS/BLUE SHIELD | Admitting: Podiatry

## 2018-06-09 DIAGNOSIS — M7732 Calcaneal spur, left foot: Secondary | ICD-10-CM

## 2018-06-09 DIAGNOSIS — M722 Plantar fascial fibromatosis: Secondary | ICD-10-CM | POA: Diagnosis not present

## 2018-06-09 MED ORDER — TRAMADOL HCL 50 MG PO TABS: 50.0000 mg | ORAL_TABLET | Freq: Two times a day (BID) | ORAL | 0 refills | Status: DC

## 2018-06-09 NOTE — Patient Instructions (Signed)
Tramadol extended release tablets or capsules What is this medicine? TRAMADOL (TRA ma dole) is a pain reliever. It is used to treat moderate to severe pain in adults. This medicine may be used for other purposes; ask your health care provider or pharmacist if you have questions. COMMON BRAND NAME(S): ConZip, Ryzolt, Ultram ER What should I tell my health care provider before I take this medicine? They need to know if you have any of these conditions: -brain tumor -drink more than 3 alcohol-containing drinks per day -drug abuse or addiction -head injury -kidney disease or problems going to the bathroom -liver disease -lung disease, asthma, or breathing problems -seizures or epilepsy -an unusual or allergic reaction to tramadol, codeine, other medicines, foods, dyes, or preservatives -pregnant or trying to get pregnant -breast-feeding How should I use this medicine? Take this medicine by mouth with a glass of water. Follow the directions on the prescription label. Do not cut, crush or chew this medicine. Take this medicine the same way each day, either with or without food. If it upsets your stomach, take it with food. Take your medicine at regular intervals. Do not take it more often than directed. Do not stop taking except on your doctor's advice. A special MedGuide will be given to you by the pharmacist with each prescription and refill. Be sure to read this information carefully each time. Talk to your pediatrician regarding the use of this medicine in children. This medicine is not approved for use in children. Overdosage: If you think you have taken too much of this medicine contact a poison control center or emergency room at once. NOTE: This medicine is only for you. Do not share this medicine with others. What if I miss a dose? If you miss a dose, take it as soon as you can. If it is almost time for your next dose, take only that dose. Do not take double or extra doses. What may  interact with this medicine? Do not take this medication with any of the following medicines: -MAOIs like Carbex, Eldepryl, Marplan, Nardil, and Parnate This medicine may also interact with the following medications: -alcohol -antihistamines for allergy, cough and cold -certain medicines for anxiety or sleep -certain medicines for depression like amitriptyline, fluoxetine, sertraline -certain medicines for migraine headache like almotriptan, eletriptan, frovatriptan, naratriptan, rizatriptan, sumatriptan, zolmitriptan -certain medicines for seizures like carbamazepine, oxcarbazepine, phenobarbital, primidone -certain medicines that treat or prevent blood clots like warfarin -digoxin -furazolidone -general anesthetics like halothane, isoflurane, methoxyflurane, propofol -linezolid -local anesthetics like lidocaine, pramoxine, tetracaine -medicines that relax muscles for surgery -other narcotic medicines for pain or cough -phenothiazines like chlorpromazine, mesoridazine, prochlorperazine, thioridazine -procarbazine This list may not describe all possible interactions. Give your health care provider a list of all the medicines, herbs, non-prescription drugs, or dietary supplements you use. Also tell them if you smoke, drink alcohol, or use illegal drugs. Some items may interact with your medicine. What should I watch for while using this medicine? Tell your doctor or health care professional if your pain does not go away, if it gets worse, or if you have new or a different type of pain. You may develop tolerance to the medicine. Tolerance means that you will need a higher dose of the medicine for pain relief. Tolerance is normal and is expected if you take this medicine for a long time. Do not suddenly stop taking your medicine because you may develop a severe reaction. Your body becomes used to the medicine. This does NOT  mean you are addicted. Addiction is a behavior related to getting and  using a drug for a non-medical reason. If you have pain, you have a medical reason to take pain medicine. Your doctor will tell you how much medicine to take. If your doctor wants you to stop the medicine, the dose will be slowly lowered over time to avoid any side effects. There are different types of narcotic medicines (opiates). If you take more than one type at the same time or if you are taking another medicine that also causes drowsiness, you may have more side effects. Give your health care provider a list of all medicines you use. Your doctor will tell you how much medicine to take. Do not take more medicine than directed. Call emergency for help if you have problems breathing or unusual sleepiness. You may get drowsy or dizzy. Do not drive, use machinery, or do anything that needs mental alertness until you know how this medicine affects you. Do not stand or sit up quickly, especially if you are an older patient. This reduces the risk of dizzy or fainting spells. Alcohol can increase or decrease the effects of this medicine. Avoid alcoholic drinks. You may have constipation. Try to have a bowel movement at least every 2 to 3 days. If you do not have a bowel movement for 3 days, call your doctor or health care professional. Your mouth may get dry. Chewing sugarless gum or sucking hard candy, and drinking plenty of water may help. Contact your doctor if the problem does not go away or is severe. What side effects may I notice from receiving this medicine? Side effects that you should report to your doctor or health care professional as soon as possible: -allergic reactions like skin rash, itching or hives, swelling of the face, lips, or tongue -breathing problems -confusion -seizures -signs and symptoms of low blood pressure like dizziness; feeling faint or lightheaded, falls; unusually weak or tired -trouble passing urine or change in the amount of urine Side effects that usually do not require  medical attention (report to your doctor or health care professional if they continue or are bothersome): -constipation -dry mouth -nausea, vomiting -tiredness This list may not describe all possible side effects. Call your doctor for medical advice about side effects. You may report side effects to FDA at 1-800-FDA-1088. Where should I keep my medicine? Keep out of the reach of children. This medicine may cause accidental overdose and death if it taken by other adults, children, or pets. Mix any unused medicine with a substance like cat litter or coffee grounds. Then throw the medicine away in a sealed container like a sealed bag or a coffee can with a lid. Do not use the medicine after the expiration date. Store at room temperature between 15 and 30 degrees C (59 and 86 degrees F). Keep container tightly closed. NOTE: This sheet is a summary. It may not cover all possible information. If you have questions about this medicine, talk to your doctor, pharmacist, or health care provider.  2018 Elsevier/Gold Standard (2015-04-10 09:05:35)

## 2018-06-10 ENCOUNTER — Telehealth: Payer: Self-pay | Admitting: *Deleted

## 2018-06-10 DIAGNOSIS — T148XXA Other injury of unspecified body region, initial encounter: Secondary | ICD-10-CM

## 2018-06-10 DIAGNOSIS — M722 Plantar fascial fibromatosis: Secondary | ICD-10-CM

## 2018-06-10 NOTE — Telephone Encounter (Signed)
-----   Message from Vivi BarrackMatthew R Wagoner, DPM sent at 06/09/2018  2:25 PM EST ----- Can you please order an MRI to rule out a plantar fascial tear? This is for pre-op planning. Thanks.

## 2018-06-10 NOTE — Telephone Encounter (Signed)
Orders to J. Quintana, RN for pre-cert and faxed to [facility]. 

## 2018-06-11 DIAGNOSIS — M7732 Calcaneal spur, left foot: Secondary | ICD-10-CM | POA: Insufficient documentation

## 2018-06-11 NOTE — Progress Notes (Signed)
Subjective: 47 year old female presents the office today for concerns and continued pain of her left heel.  She said the night splint has helped some overall she is having pain.  The inserts is not been helping and the Medrol Dosepak did not help.  She states that due to the pain she does not mail to work some.  She wants to consider surgery.Denies any systemic complaints such as fevers, chills, nausea, vomiting. No acute changes since last appointment, and no other complaints at this time.   Objective: AAO x3, NAD DP/PT pulses palpable bilaterally, CRT less than 3 seconds There is continuation of tenderness palpation on the plantar medial tubercle of the calcaneus at the insertion of the plantar fascia on the left foot.  Plantar fascial appears to be intact.  No pain with lateral compression.  Also some tenderness on the central aspect of the plantar heel.  No open lesions or pre-ulcerative lesions.  No pain with calf compression, swelling, warmth, erythema  Assessment: 47 year old female chronic left heel pain, plantar fasciitis, bone spur  Plan: -All treatment options discussed with the patient including all alternatives, risks, complications.  -X-rays that she had done previously were reviewed.  Inferior calcaneal spurring is present.  At this time we discussed surgical intervention including heel spur resection as well as endoscopic plantar fascial release and PRP injection.  She was to plan on doing this in December when the tentatively schedule her for December 30 however I want to go and get an MRI to rule out any tear prior to surgery.  This was ordered today.  We will see her back prior to surgery for surgical consent. -Patient encouraged to call the office with any questions, concerns, change in symptoms.   Vivi BarrackMatthew R Wagoner DPM

## 2018-06-15 ENCOUNTER — Ambulatory Visit (HOSPITAL_COMMUNITY)
Admission: RE | Admit: 2018-06-15 | Discharge: 2018-06-15 | Disposition: A | Discharge status: Home / Self Care | Payer: BLUE CROSS/BLUE SHIELD | Source: Ambulatory Visit | Attending: Podiatry | Admitting: Podiatry

## 2018-06-15 DIAGNOSIS — M722 Plantar fascial fibromatosis: Secondary | ICD-10-CM | POA: Insufficient documentation

## 2018-06-17 ENCOUNTER — Telehealth: Payer: Self-pay | Admitting: *Deleted

## 2018-06-17 NOTE — Telephone Encounter (Signed)
I informed pt of Dr. Gabriel RungWagoner's review of results and orders, and reminded pt of 07/25/2018 12:45pm appt.

## 2018-06-17 NOTE — Telephone Encounter (Signed)
-----   Message from Vivi BarrackMatthew R Wagoner, DPM sent at 06/16/2018  5:35 PM EST ----- Val- please let her know that the MRI did show plantar fasciitis but no other pathology. Can proceed as scheduled with [facility]. She should have a pre-op appointment scheduled. Thanks.

## 2018-07-09 MED FILL — ALBUTEROL SULFATE HFA 108 (: 108 (90 BAS | 25 days supply | Qty: 18 | Fill #3

## 2018-07-09 MED FILL — AMLODIPINE BESYLATE 5 MG TA: 5 | 30 days supply | Qty: 30 | Fill #2

## 2018-07-25 ENCOUNTER — Encounter: Payer: Self-pay | Admitting: Podiatry

## 2018-07-25 ENCOUNTER — Ambulatory Visit: Payer: BLUE CROSS/BLUE SHIELD | Admitting: Podiatry

## 2018-07-25 DIAGNOSIS — M722 Plantar fascial fibromatosis: Secondary | ICD-10-CM | POA: Diagnosis not present

## 2018-07-25 DIAGNOSIS — T148XXA Other injury of unspecified body region, initial encounter: Secondary | ICD-10-CM

## 2018-07-25 DIAGNOSIS — M7732 Calcaneal spur, left foot: Secondary | ICD-10-CM | POA: Diagnosis not present

## 2018-07-25 MED ORDER — CEPHALEXIN 500 MG PO CAPS: 500.0000 mg | ORAL_CAPSULE | Freq: Three times a day (TID) | ORAL | 0 refills | Status: DC

## 2018-07-25 MED ORDER — PROMETHAZINE HCL 25 MG PO TABS: 25.0000 mg | ORAL_TABLET | Freq: Three times a day (TID) | ORAL | 0 refills | Status: DC | PRN

## 2018-07-25 MED ORDER — OXYCODONE-ACETAMINOPHEN 5-325 MG PO TABS: 1.0000 | ORAL_TABLET | Freq: Four times a day (QID) | ORAL | 0 refills | Status: DC | PRN

## 2018-07-25 NOTE — Patient Instructions (Signed)
Pre-Operative Instructions  Congratulations, you have decided to take an important step towards improving your quality of life.  You can be assured that the doctors and staff at [facility] will be with you every step of the way.  Here are some important things you should know:  1. Plan to be at the surgery center/hospital at least 1 (one) hour prior to your scheduled time, unless otherwise directed by the surgical center/hospital staff.  You must have a responsible adult accompany you, remain during the surgery and drive you home.  Make sure you have directions to the surgical center/hospital to ensure you arrive on time. 2. If you are having surgery at [facility] or [facility] hospitals, you will need a copy of your medical history and physical form from your family physician within one month prior to the date of surgery. We will give you a form for your primary physician to complete.  3. We make every effort to accommodate the date you request for surgery.  However, there are times where surgery dates or times have to be moved.  We will contact you as soon as possible if a change in schedule is required.   4. No aspirin/ibuprofen for one week before surgery.  If you are on aspirin, any non-steroidal anti-inflammatory medications (Mobic, Aleve, Ibuprofen) should not be taken seven (7) days prior to your surgery.  You make take Tylenol for pain prior to surgery.  5. Medications - If you are taking daily heart and blood pressure medications, seizure, reflux, allergy, asthma, anxiety, pain or diabetes medications, make sure you notify the surgery center/hospital before the day of surgery so they can tell you which medications you should take or avoid the day of surgery. 6. No food or drink after midnight the night before surgery unless directed otherwise by surgical center/hospital staff. 7. No alcoholic beverages 24-hours prior to surgery.  No smoking 24-hours prior or 24-hours after  surgery. 8. Wear loose pants or shorts. They should be loose enough to fit over bandages, boots, and casts. 9. Don't wear slip-on shoes. Sneakers are preferred. 10. Bring your boot with you to the surgery center/hospital.  Also bring crutches or a walker if your physician has prescribed it for you.  If you do not have this equipment, it will be provided for you after surgery. 11. If you have not been contacted by the surgery center/hospital by the day before your surgery, call to confirm the date and time of your surgery. 12. Leave-time from work may vary depending on the type of surgery you have.  Appropriate arrangements should be made prior to surgery with your employer. 13. Prescriptions will be provided immediately following surgery by your doctor.  Fill these as soon as possible after surgery and take the medication as directed. Pain medications will not be refilled on weekends and must be approved by the doctor. 14. Remove nail polish on the operative foot and avoid getting pedicures prior to surgery. 15. Wash the night before surgery.  The night before surgery wash the foot and leg well with water and the antibacterial soap provided. Be sure to pay special attention to beneath the toenails and in between the toes.  Wash for at least three (3) minutes. Rinse thoroughly with water and dry well with a towel.  Perform this wash unless told not to do so by your physician.  Enclosed: 1 Ice pack (please put in freezer the night before surgery)   1 Hibiclens skin cleaner     Pre-op instructions  If you have any questions regarding the instructions, please do not hesitate to call our office.  [place]: [address], [place], [place] [address] -- [phone]  [place]: [address] [place], [place] [address] -- [phone]  [place]: [address]  [place], [place] [address] -- [phone]  [place]: [address], [address], [place],  [address] -- [phone]  Website:  [email or web address] 

## 2018-07-28 ENCOUNTER — Encounter: Payer: Self-pay | Admitting: Podiatry

## 2018-07-28 DIAGNOSIS — M722 Plantar fascial fibromatosis: Secondary | ICD-10-CM | POA: Diagnosis not present

## 2018-07-28 DIAGNOSIS — M7732 Calcaneal spur, left foot: Secondary | ICD-10-CM | POA: Diagnosis not present

## 2018-07-28 NOTE — Progress Notes (Signed)
Subjective: [age] year old female presents the office today for concerns and continued pain of her left heel.  She presents today for surgical consultation.  She states that she continues to have quite a bit of pain to the bottom of her left heel and she wants to proceed with surgery that is scheduled for Monday.  She is attempted numerous conservative treatments without any significant resolution.  She has no other concerns today. Denies any systemic complaints such as fevers, chills, nausea, vomiting. No acute changes since last appointment, and no other complaints at this time.   Objective: AAO x3, NAD DP/PT pulses palpable bilaterally, CRT less than 3 seconds There is continuation of tenderness palpation on the plantar medial tubercle of the calcaneus at the insertion of the plantar fascia on the left foot.  Plantar fascial appears to be intact.  No pain with lateral compression.  Also some tenderness on the central aspect of the plantar heel.  No open lesions or pre-ulcerative lesions.  No pain with calf compression, swelling, warmth, erythema  Assessment: [age] year old female chronic left heel pain, plantar fasciitis, bone spur  Plan: -All treatment options discussed with the patient including all alternatives, risks, complications.  -I again reviewed the x-rays with her and we discussed both conservative as well as surgical treatment options.  We also discussed the MRI.  At this time we discussed surgical intervention including his heel spur resection.  She is having clinical symptoms on the plantar fascia therefore discussed with her endoscopic plantar fascial release.  Also PRP injection. -The incision placement as well as the postoperative course was discussed with the patient. I discussed risks of the surgery which include, but not limited to, infection, bleeding, pain, swelling, need for further surgery, delayed or nonhealing, painful or ugly scar, numbness or sensation changes, over/under  correction, recurrence, transfer lesions, further deformity, hardware failure, DVT/PE, loss of toe/foot. Patient understands these risks and wishes to proceed with surgery. The surgical consent was reviewed with the patient all 3 pages were signed. No promises or guarantees were given to the outcome of the procedure. All questions were answered to the best of my ability. Before the surgery the patient was encouraged to call the office if there is any further questions. [facility]. -CAM boot dispensed for postop use  Vivi BarrackMatthew R Canesha Tesfaye DPM

## 2018-07-29 ENCOUNTER — Telehealth: Payer: Self-pay | Admitting: Podiatry

## 2018-07-29 ENCOUNTER — Telehealth: Payer: Self-pay | Admitting: *Deleted

## 2018-07-29 DIAGNOSIS — M722 Plantar fascial fibromatosis: Secondary | ICD-10-CM

## 2018-07-29 DIAGNOSIS — Z9889 Other specified postprocedural states: Secondary | ICD-10-CM

## 2018-07-29 NOTE — Telephone Encounter (Signed)
Orders faxed to [facility] and emailed to A. Baron Hamperrotter.

## 2018-07-29 NOTE — Telephone Encounter (Signed)
-----   Message from Lanney GinsLisa R Cox, [name] sent at 07/28/2018  2:05 PM EST ----- Elvina SidleHey, Dr Ardelle AntonWagoner stated that he would like to have a knee scooter for the patient due to patient just had surgery today. Thanks Misty StanleyLisa

## 2018-07-29 NOTE — Telephone Encounter (Signed)
Pt had surgery yesterday and the doctor was going to order a knee scooter, following up to see when she will receive it. Pt also stated she is in a lot of pain today.  Please give pt a call with update on scooter.

## 2018-07-29 NOTE — Telephone Encounter (Signed)
Faxed the requested information to [facility].

## 2018-07-29 NOTE — Telephone Encounter (Signed)
I called pt and she states she is not able to get up to call concerning the knee scooter. Pt states she is having sharp pain in the ankle. I told pt that she should continue the ice and percocet and if tolerates should take ibuprofen as the package instructs in between the dosing of the percocet. Pt states understanding.

## 2018-07-29 NOTE — Telephone Encounter (Signed)
Melissa - [facility] transferred to Medical Supply department, Liborio NixonJanice states orders have been received for the knee scooter and pt will be contacted today.

## 2018-07-29 NOTE — Telephone Encounter (Signed)
Marylene LandAngela - [facility] requested surgery note, demographics to be faxed to her, pt was not one of theirs.

## 2018-07-31 ENCOUNTER — Telehealth: Payer: Self-pay | Admitting: *Deleted

## 2018-07-31 NOTE — Telephone Encounter (Signed)
Called and spoke with the patient and the patient is doing well with a pain scale of 7 and there is no fever or chills and no nausea and I have elevated and iced my foot and I have taken my pain medicine and ibuprofen in between and I did have my son pick up my knee scooter today at 1:30 pm and if any concerns or questions to call the [place] office at [phone]. Misty Stanley

## 2018-08-04 ENCOUNTER — Encounter: Payer: Self-pay | Admitting: Podiatry

## 2018-08-04 ENCOUNTER — Ambulatory Visit (INDEPENDENT_AMBULATORY_CARE_PROVIDER_SITE_OTHER): Payer: Self-pay | Admitting: Podiatry

## 2018-08-04 ENCOUNTER — Ambulatory Visit (INDEPENDENT_AMBULATORY_CARE_PROVIDER_SITE_OTHER): Payer: BLUE CROSS/BLUE SHIELD

## 2018-08-04 DIAGNOSIS — Z9889 Other specified postprocedural states: Secondary | ICD-10-CM

## 2018-08-04 DIAGNOSIS — M722 Plantar fascial fibromatosis: Secondary | ICD-10-CM

## 2018-08-04 MED ORDER — IBUPROFEN 800 MG PO TABS: 800.0000 mg | ORAL_TABLET | Freq: Three times a day (TID) | ORAL | 0 refills | Status: DC | PRN

## 2018-08-04 MED ORDER — OXYCODONE-ACETAMINOPHEN 5-325 MG PO TABS: 1.0000 | ORAL_TABLET | Freq: Four times a day (QID) | ORAL | 0 refills | Status: DC | PRN

## 2018-08-12 NOTE — Progress Notes (Signed)
Subjective: Wendy James is a 48 y.o. is seen today in office s/p left foot endoscopic plantar fascial release, heel spur resection, PRP injection preformed on 07/28/2018.  Overall she states she is doing well and her pain is better controlled.  She is been in the cam boot this is been transitioned off her foot as much as possible.  Denies any systemic complaints such as fevers, chills, nausea, vomiting. No calf pain, chest pain, shortness of breath.   Objective: General: No acute distress, AAOx3  DP/PT pulses palpable 2/4, CRT < 3 sec to all digits.  Protective sensation intact. Motor function intact.  LEFT foot: Incision is well coapted without any evidence of dehiscence and sutures are intact. There is no surrounding erythema, ascending cellulitis, fluctuance, crepitus, malodor, drainage/purulence. There is mild edema around the surgical site. There is mild pain along the surgical site.  Incisions are healing well any signs of infection or dehiscence. No other areas of tenderness to bilateral lower extremities.  No other open lesions or pre-ulcerative lesions.  No pain with calf compression, swelling, warmth, erythema.   Assessment and Plan:  Status post left foot surgery, doing well with no complications   -Treatment options discussed including all alternatives, risks, and complications -X-rays were obtained and reviewed.  I did show her the x-rays today which I did show there is still residual bone spur.  Clinically during the surgery I was able to palpate the prominent area which was shaved down.  She understands this is still evident on x-ray however. -Overall she is doing well.  Antibiotic ointment and a bandage was applied.  Keep the dressing clean, dry, intact. -Remain in cam boot.  As she is feeling better she can start to transition to weightbearing in the cam boot. -Ice/elevation -Pain medication as needed. -Monitor for any clinical signs or symptoms of infection and DVT/PE and  directed to call the office immediately should any occur or go to the ER. -Follow-up as scheduled or sooner if any problems arise. In the meantime, encouraged to call the office with any questions, concerns, change in symptoms.   Ovid Curd, DPM

## 2018-08-14 ENCOUNTER — Ambulatory Visit (INDEPENDENT_AMBULATORY_CARE_PROVIDER_SITE_OTHER): Payer: Self-pay | Admitting: Podiatry

## 2018-08-14 DIAGNOSIS — M722 Plantar fascial fibromatosis: Secondary | ICD-10-CM

## 2018-08-14 DIAGNOSIS — T148XXA Other injury of unspecified body region, initial encounter: Secondary | ICD-10-CM

## 2018-08-14 DIAGNOSIS — Z9889 Other specified postprocedural states: Secondary | ICD-10-CM

## 2018-08-17 NOTE — Progress Notes (Signed)
Subjective: Wendy James is a 48 y.o. is seen today in office s/p left foot endoscopic plantar fascial release, heel spur resection, PRP injection preformed on 07/28/2018.  She presents today walking in the cam boot without crutches.  She states that when she is walking she is putting majority of pain to the toes and trend avoid pressure to the foot as it is difficult to put pressure to the heel.  She denies any recent injury or trauma.  She presents today for suture removal.  She has no other concerns today. Denies any systemic complaints such as fevers, chills, nausea, vomiting. No calf pain, chest pain, shortness of breath.   Objective: General: No acute distress, AAOx3  DP/PT pulses palpable 2/4, CRT < 3 sec to all digits.  Protective sensation intact. Motor function intact.  LEFT foot: Incision is well coapted without any evidence of dehiscence and sutures are intact.  There is still some mild swelling to the plantar aspect the heel and tenderness palpation on the surgical sites.  No pain or blood pressure calcaneus.  No pain to the heel posteriorly.  No other areas of tenderness. No other areas of tenderness to bilateral lower extremities.  No other open lesions or pre-ulcerative lesions.  No pain with calf compression, swelling, warmth, erythema.   Assessment and Plan:  Status post left foot surgery, doing well with no complications   -Treatment options discussed including all alternatives, risks, and complications -Sutures removed without any complications after removal incision remained well coapted.  Antibiotic ointment and a bandage was applied.  She can start to shower but I want her to apply a similar dressing. -Continue with the cam boot but I want her to walk heel-to-toe as normal as possible.  Continue with stretching, icing exercises daily. -Ice/elevation -Pain medication as needed. -Monitor for any clinical signs or symptoms of infection and DVT/PE and directed to call the  office immediately should any occur or go to the ER. -Follow-up as scheduled or sooner if any problems arise. In the meantime, encouraged to call the office with any questions, concerns, change in symptoms.   *Likely start physical therapy next appointment  Ovid Curd, DPM

## 2018-08-21 ENCOUNTER — Ambulatory Visit: Payer: BLUE CROSS/BLUE SHIELD | Admitting: Family Medicine

## 2018-08-21 MED FILL — ALBUTEROL SULFATE HFA 108 (: 108 (90 BAS | 25 days supply | Qty: 18 | Fill #4

## 2018-08-21 MED FILL — AMLODIPINE BESYLATE 5 MG TA: 5 | 30 days supply | Qty: 30 | Fill #3

## 2018-08-25 ENCOUNTER — Encounter: Payer: Self-pay | Admitting: Podiatry

## 2018-08-25 ENCOUNTER — Ambulatory Visit (INDEPENDENT_AMBULATORY_CARE_PROVIDER_SITE_OTHER): Payer: BLUE CROSS/BLUE SHIELD | Admitting: Podiatry

## 2018-08-25 VITALS — BP 138/87 | HR 77 | Temp 97.5°F | Resp 16

## 2018-08-25 DIAGNOSIS — M722 Plantar fascial fibromatosis: Secondary | ICD-10-CM

## 2018-08-25 NOTE — Patient Instructions (Signed)

## 2018-08-25 NOTE — Progress Notes (Signed)
Subjective: Wendy James is a 48 y.o. is seen today in office s/p left foot endoscopic plantar fascial release, heel spur resection, PRP injection preformed on 07/28/2018.  Overall she states that she is getting better but does experience intermittent burning pain.  She is got to where she is walking in the cam boot but she does not bear full weight on the heel.  During the day she states that she gets up when she does activities but after is on her feet for some time she started to notice the discomfort.  She denies any recent injury or trauma.  She presents today by herself.  She has no other concerns today.  Denies any systemic complaints such as fevers, chills, nausea, vomiting. No calf pain, chest pain, shortness of breath.   Objective: General: No acute distress, AAOx3  DP/PT pulses palpable 2/4, CRT < 3 sec to all digits.  Protective sensation intact. Motor function intact.  LEFT foot: Incision is well coapted without any evidence of dehiscence and scars are formed.  There is mild edema to the surgical site.  There is decreased tenderness palpation of the heel.  There is no pain with compression of calcaneus.  No pain on the Achilles tendon.  No other areas of tenderness identified today.  No other open lesions or pre-ulcerative lesions.  No pain with calf compression, swelling, warmth, erythema.   Assessment and Plan:  Status post left foot surgery, doing well with no complications   -Treatment options discussed including all alternatives, risks, and complications -Overall she is doing better.  I want her to continue with some home rehab exercises and a referral to physical therapy.  A prescription control physical therapy was written today.  Continue cam boot for now as she progresses with physical therapy she can transition to regular shoe.  Continue to ice the area as well.  She is getting more burning pain to her feet I prescribed gabapentin 100 mg at nighttime and she can titrate up  as needed but monitor for side effects we discussed.  Refilled ibuprofen as well.  Vivi Barrack DPM

## 2018-08-27 ENCOUNTER — Telehealth: Payer: Self-pay | Admitting: Podiatry

## 2018-08-27 MED ORDER — GABAPENTIN 100 MG PO CAPS: 100.0000 mg | ORAL_CAPSULE | Freq: Every day | ORAL | 3 refills | Status: DC

## 2018-08-27 MED ORDER — IBUPROFEN 800 MG PO TABS: 800.0000 mg | ORAL_TABLET | Freq: Three times a day (TID) | ORAL | 0 refills | Status: DC | PRN

## 2018-08-27 NOTE — Addendum Note (Signed)
Addended by: Alphia Kava D on: 08/27/2018 05:28 PM   Modules accepted: Orders

## 2018-08-27 NOTE — Telephone Encounter (Signed)
I called pt and she states Dr. Ardelle Anton did not send the ibuprofen and gapapentin to her pharmacy. I asked pt if she needed a refill of the gabapentin. Pt states yes. I reviewed LOV 08/25/2018 and Dr. Ardelle Anton had wanted pt to continue both medications. I refilled as ordered.

## 2018-08-27 NOTE — Telephone Encounter (Signed)
I'm a pt of Dr. Gabriel Rung. If someone could please call me back.

## 2018-09-15 ENCOUNTER — Encounter: Payer: BLUE CROSS/BLUE SHIELD | Admitting: Podiatry

## 2018-09-17 ENCOUNTER — Telehealth: Payer: Self-pay | Admitting: *Deleted

## 2018-09-17 ENCOUNTER — Telehealth: Payer: Self-pay | Admitting: Podiatry

## 2018-09-17 NOTE — Telephone Encounter (Signed)
I'm a pt of Dr. Gabriel Rung. Can someone please give me a call back.

## 2018-09-17 NOTE — Telephone Encounter (Signed)
I called pt, she asked for an appt with Dr. Ardelle Anton to discuss return to work after her surgery.

## 2018-09-17 NOTE — Telephone Encounter (Signed)
Called and spoke with patient and stated that I was calling to check on patient due to patient missed appointment with Dr Ardelle Anton for a post op visit and patient stated that she was so sorry she missed appointment but went to Physical therapy and is doing good and does have a follow up next Thursday and I stated to call the office if any concerns or questions. Misty Stanley

## 2018-09-25 ENCOUNTER — Encounter: Payer: Self-pay | Admitting: Podiatry

## 2018-09-25 ENCOUNTER — Ambulatory Visit (INDEPENDENT_AMBULATORY_CARE_PROVIDER_SITE_OTHER): Payer: BLUE CROSS/BLUE SHIELD | Admitting: Podiatry

## 2018-09-25 DIAGNOSIS — M722 Plantar fascial fibromatosis: Secondary | ICD-10-CM

## 2018-09-25 DIAGNOSIS — Z9889 Other specified postprocedural states: Secondary | ICD-10-CM

## 2018-09-25 MED ORDER — IBUPROFEN 800 MG PO TABS: 800.0000 mg | ORAL_TABLET | Freq: Three times a day (TID) | ORAL | 0 refills | Status: DC | PRN

## 2018-09-25 MED FILL — ALBUTEROL SULFATE HFA 108 (: 108 (90 BAS | 25 days supply | Qty: 18 | Fill #5

## 2018-09-25 MED FILL — AMLODIPINE BESYLATE 5 MG TA: 5 | 30 days supply | Qty: 30 | Fill #4

## 2018-09-25 NOTE — Progress Notes (Signed)
Subjective: Wendy James is a 48 y.o. is seen today in office s/p left foot endoscopic plantar fascial release, heel spur resection, PRP injection preformed on 07/28/2018.  Overall she states that doing well and she is ready go back to work.  She states that overall she is doing well she not having any significant pain.  States she is having a little tenderness but overall she is improving.  She presents today wearing a regular shoe.  She states this feels substantially better than it did prior to surgery. Denies any systemic complaints such as fevers, chills, nausea, vomiting. No calf pain, chest pain, shortness of breath.   Objective: General: No acute distress, AAOx3  DP/PT pulses palpable 2/4, CRT < 3 sec to all digits.  Protective sensation intact. Motor function intact.  LEFT foot: Incision is well coapted without any evidence of dehiscence and scars are formed.  At this time there is no area of tenderness in the surgical site appears to be healed.  There is minimal edema there is no erythema or warmth.  Overall she is doing better. No other open lesions or pre-ulcerative lesions.  No pain with calf compression, swelling, warmth, erythema.   Assessment and Plan:  Status post left foot surgery, doing well with no complications   -Treatment options discussed including all alternatives, risks, and complications -She has made great progress since I saw her last and she is continue with physical therapy.  She went a regular shoe.  She is getting back to work on March 9 I think this is doable no restrictions for her to go for sit at work as well as ice at work for about 3 weeks after returning to work as needed.  Also dispensed a gel heel cup for her take pressure off of the heel when she is wearing steel toed shoes.  Return in about 4 weeks (around 10/23/2018).  Vivi Barrack DPM

## 2018-10-20 ENCOUNTER — Other Ambulatory Visit: Payer: Self-pay | Admitting: Podiatry

## 2018-10-20 ENCOUNTER — Telehealth: Payer: Self-pay | Admitting: Podiatry

## 2018-10-20 MED ORDER — TRAMADOL HCL 50 MG PO TABS: 50.0000 mg | ORAL_TABLET | Freq: Two times a day (BID) | ORAL | 0 refills | Status: DC

## 2018-10-20 NOTE — Telephone Encounter (Signed)
Left message informing pt Dr. Ardelle Anton had prescribed Tramadol to take at bedtime when working and could take as directed when not working.

## 2018-10-20 NOTE — Telephone Encounter (Signed)
Patient called requesting refill of oxycodone. Pt states that after being on her foot for 11hr shift at work she is experiencing severe pain in foot. Please give pt a call

## 2018-10-20 NOTE — Telephone Encounter (Signed)
Pt called to reply for a message she got about medication refill. Stated she is unable to receive calls at work and "will try to answer this time" Please call when possible.

## 2018-10-20 NOTE — Telephone Encounter (Signed)
Pt states she is walking and standing at work, has swelling and pain, is able to rest, and elevate on occasion at work and can take the ibuprofen at work but needs something more at bedtime. I instructed pt to continue to rest and ice for the episodes and I would message Dr. Ardelle Anton.

## 2018-10-20 NOTE — Telephone Encounter (Signed)
I sent tramadol to the pharmacy for her. Please reiterate that she cannot take pain medication when working.

## 2018-10-20 NOTE — Telephone Encounter (Signed)
Left message requesting a call to discuss comfort measures and that I would discuss with Dr. Ardelle Anton possibility of ordering pain medication for bedtime, but not to be used during work.

## 2018-10-21 NOTE — Telephone Encounter (Signed)
Left message that tramadol was called to [facility].

## 2018-10-29 ENCOUNTER — Telehealth: Payer: Self-pay

## 2018-10-29 ENCOUNTER — Other Ambulatory Visit: Payer: Self-pay

## 2018-10-29 ENCOUNTER — Ambulatory Visit (INDEPENDENT_AMBULATORY_CARE_PROVIDER_SITE_OTHER): Payer: BLUE CROSS/BLUE SHIELD | Admitting: Family Medicine

## 2018-10-29 DIAGNOSIS — J4520 Mild intermittent asthma, uncomplicated: Secondary | ICD-10-CM

## 2018-10-29 MED ORDER — ALBUTEROL SULFATE (5 MG/ML) 0.5% IN NEBU: 2.5000 mg | INHALATION_SOLUTION | Freq: Four times a day (QID) | RESPIRATORY_TRACT | 0 refills | Status: DC | PRN

## 2018-10-29 MED ORDER — MONTELUKAST SODIUM 10 MG PO TABS: 10.0000 mg | ORAL_TABLET | Freq: Every day | ORAL | 3 refills | Status: DC

## 2018-10-29 MED ORDER — ALBUTEROL SULFATE HFA 108 (90 BASE) MCG/ACT IN AERS: 1.0000 | INHALATION_SPRAY | Freq: Four times a day (QID) | RESPIRATORY_TRACT | 0 refills | Status: DC | PRN

## 2018-10-29 MED ORDER — PREDNISONE 10 MG (21) PO TBPK: ORAL_TABLET | ORAL | 0 refills | Status: DC

## 2018-10-29 NOTE — Progress Notes (Signed)
  [facility]  Virtual Visit via Telephone Note  I connected with Sondi Gribbon on 10/29/18 at  3:20 PM EDT by telephone and verified that I am speaking with the correct person using two identifiers.   I discussed the limitations, risks, security and privacy concerns of performing an evaluation and management service by telephone and the availability of in person appointments. I also discussed with the patient that there may be a patient responsible charge related to this service. The patient expressed understanding and agreed to proceed.   History of Present Illness: Wendy James  has a past medical history of Arthritis, Asthma, Bone spur of foot, and Seasonal allergies. She states that she has not taken her allergy medications recently. She is having mild wheezing and cough. Denies fevers, chills or night sweats. History of asthma.      Observations/Objective: Patient with regular voice tone, rate and rhythm. Speaking calmly and is in no apparent distress.   Assessment and Plan: 1. Mild intermittent asthma without complication - predniSONE (STERAPRED UNI-PAK 21 TAB) 10 MG (21) TBPK tablet; Take as directed  Dispense: 21 tablet; Refill: 0 - montelukast (SINGULAIR) 10 MG tablet; Take 1 tablet (10 mg total) by mouth at bedtime.  Dispense: 30 tablet; Refill: 3      Follow Up Instructions:   We discussed hand washing, using hand sanitizer when soap and water are not available, only going out when absolutely necessary, and social distancing. Explained to patient that she is immunocompromised and will need to take precautions during this time.   I discussed the assessment and treatment plan with the patient. The patient was provided an opportunity to ask questions and all were answered. The patient agreed with the plan and demonstrated an understanding of the instructions.   The patient was advised to call back or seek an in-person  evaluation if the symptoms worsen or if the condition fails to improve as anticipated.  I provided 10 minutes of non-face-to-face time during this encounter.  Ms. Andr L. Riley Lam, FNP-BC [facility] [address] [place], [place] [address] [phone]

## 2018-10-29 NOTE — Telephone Encounter (Signed)
Medication sent to pharmacy  

## 2018-11-03 MED FILL — ALBUTEROL SUL 2.5 MG/3 ML S: (2.5 MG/3ML | 6 days supply | Qty: 75 | Fill #0

## 2018-11-17 ENCOUNTER — Encounter: Payer: Self-pay | Admitting: Family Medicine

## 2018-11-19 ENCOUNTER — Telehealth: Payer: Self-pay

## 2018-11-19 DIAGNOSIS — J4520 Mild intermittent asthma, uncomplicated: Secondary | ICD-10-CM

## 2018-11-19 MED ORDER — ALBUTEROL SULFATE HFA 108 (90 BASE) MCG/ACT IN AERS: 1.0000 | INHALATION_SPRAY | Freq: Four times a day (QID) | RESPIRATORY_TRACT | 0 refills | Status: DC | PRN

## 2018-11-19 MED ORDER — SYMBICORT 80-4.5 MCG/ACT IN AERO: INHALATION_SPRAY | RESPIRATORY_TRACT | 5 refills | Status: DC

## 2018-11-19 MED FILL — SYMBICORT 80-4.5 MCG INH: 80-4.5 | 30 days supply | Qty: 10 | Fill #0

## 2018-11-19 MED FILL — ALBUTEROL SULFATE HFA 108 (: 108 (90 BAS | 25 days supply | Qty: 18 | Fill #0

## 2018-11-19 NOTE — Telephone Encounter (Signed)
[facility] Thanks!  

## 2018-11-20 ENCOUNTER — Other Ambulatory Visit: Payer: Self-pay | Admitting: Family Medicine

## 2018-11-20 DIAGNOSIS — J4520 Mild intermittent asthma, uncomplicated: Secondary | ICD-10-CM

## 2018-12-03 ENCOUNTER — Encounter: Payer: Self-pay | Admitting: Family Medicine

## 2018-12-03 ENCOUNTER — Other Ambulatory Visit: Payer: Self-pay

## 2018-12-03 ENCOUNTER — Ambulatory Visit (INDEPENDENT_AMBULATORY_CARE_PROVIDER_SITE_OTHER): Payer: BLUE CROSS/BLUE SHIELD | Admitting: Family Medicine

## 2018-12-03 VITALS — BP 136/90 | HR 74 | Temp 99.1°F | Ht 62.0 in | Wt 223.0 lb

## 2018-12-03 DIAGNOSIS — H66002 Acute suppurative otitis media without spontaneous rupture of ear drum, left ear: Secondary | ICD-10-CM | POA: Diagnosis not present

## 2018-12-03 DIAGNOSIS — I10 Essential (primary) hypertension: Secondary | ICD-10-CM

## 2018-12-03 DIAGNOSIS — H6123 Impacted cerumen, bilateral: Secondary | ICD-10-CM | POA: Diagnosis not present

## 2018-12-03 DIAGNOSIS — J4520 Mild intermittent asthma, uncomplicated: Secondary | ICD-10-CM

## 2018-12-03 DIAGNOSIS — J3489 Other specified disorders of nose and nasal sinuses: Secondary | ICD-10-CM | POA: Diagnosis not present

## 2018-12-03 DIAGNOSIS — H9202 Otalgia, left ear: Secondary | ICD-10-CM | POA: Diagnosis not present

## 2018-12-03 DIAGNOSIS — H6690 Otitis media, unspecified, unspecified ear: Secondary | ICD-10-CM

## 2018-12-03 MED ORDER — AMOXICILLIN-POT CLAVULANATE 875-125 MG PO TABS: 1.0000 | ORAL_TABLET | Freq: Two times a day (BID) | ORAL | 0 refills | Status: AC

## 2018-12-03 MED ORDER — AMOXICILLIN-POT CLAVULANATE 875-125 MG PO TABS: 1.0000 | ORAL_TABLET | Freq: Two times a day (BID) | ORAL | 0 refills | Status: DC

## 2018-12-03 NOTE — Progress Notes (Signed)
[facility]   Sick Visit  Subjective:  Patient ID: Wendy James, female    DOB: [birth year]  Age: 49 y.o. MRN: [id number]  CC:  Chief Complaint  Patient presents with  . Sinusitis  . Ear Pain    HPI Wendy James is a 48 year old female who presents for Sick Visit today.   Past Medical History:  Diagnosis Date  . 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 Asthma   . Bone spur of foot   . Seasonal allergies    Current Status: Since her last office visit, she is doing well with no complaints. She is currently a patient of Debby Bud, NP at our office. She has c/o sinus pain and left ear pain X 3 days now. She has not used any medication to treat her symptoms. She has occasional cough, which she r/t Asthma.   She denies fevers, chills, fatigue, recent infections, weight loss, and night sweats. She has not had any headaches, visual changes, dizziness, and falls. No chest pain, and heart palpitations. reported. No reports of GI problems such as nausea, vomiting, diarrhea, and constipation. She has no reports of blood in stools, dysuria and hematuria. No depression or anxiety reported.  Past Surgical History:  Procedure Laterality Date  . CESAREAN SECTION     x 4  . CHOLECYSTECTOMY    . HYSTEROSCOPY W/D&C N/A 02/18/2015   Procedure: DILATATION AND CURETTAGE /HYSTEROSCOPY;  Surgeon: Allie Bossier, MD;  Location: [facility];  Service: Gynecology;  Laterality: N/A;  . TUBAL LIGATION      Family History  Problem Relation Age of Onset  . Hypertension Mother   . Stroke Sister   . Seizures Sister     Social History   Socioeconomic History  . Marital status: Married    Spouse name: Not on file  . Number of children: Not on file  . Years of education: Not on file  . Highest education level: Not on file  Occupational History  . Not on file  Social Needs  . Financial resource strain: Not on file  . Food insecurity:    Worry: Not on file   Inability: Not on file  . Transportation needs:    Medical: Not on file    Non-medical: Not on file  Tobacco Use  . Smoking status: Former Smoker    Packs/day: 0.75    Types: Cigarettes    Last attempt to quit: 11/17/1988    Years since quitting: 30.0  . Smokeless tobacco: Never Used  Substance and Sexual Activity  . Alcohol use: No  . Drug use: No  . Sexual activity: Yes    Birth control/protection: Surgical  Lifestyle  . Physical activity:    Days per week: Not on file    Minutes per session: Not on file  . Stress: Not on file  Relationships  . Social connections:    Talks on phone: Not on file    Gets together: Not on file    Attends religious service: Not on file    Active member of club or organization: Not on file    Attends meetings of clubs or organizations: Not on file    Relationship status: Not on file  . Intimate partner violence:    Fear of current or ex partner: Not on file    Emotionally abused: Not on file    Physically abused: Not on file    Forced  sexual activity: Not on file  Other Topics Concern  . Not on file  Social History Narrative  . Not on file    Outpatient Medications Prior to Visit  Medication Sig Dispense Refill  . albuterol (PROVENTIL) (5 MG/ML) 0.5% nebulizer solution Take 0.5 mLs (2.5 mg total) by nebulization every 6 (six) hours as needed for wheezing or shortness of breath. 20 mL 0  . albuterol (VENTOLIN HFA) 108 (90 Base) MCG/ACT inhaler INHALE 1 TO 2 PUFFS INTO THE LUNGS EVERY 6 HOURS AS NEEDED FOR WHEEZING OR SHORTNESS OF BREATH 6.7 g 3  . amLODipine (NORVASC) 5 MG tablet Take 1 tablet (5 mg total) by mouth daily. 30 tablet 5  . gabapentin (NEURONTIN) 100 MG capsule Take 1 capsule (100 mg total) by mouth at bedtime. 30 capsule 3  . ibuprofen (ADVIL,MOTRIN) 800 MG tablet Take 1 tablet (800 mg total) by mouth every 8 (eight) hours as needed. 30 tablet 0  . montelukast (SINGULAIR) 10 MG tablet Take 1 tablet (10 mg total) by mouth at  bedtime. 30 tablet 3  . SYMBICORT 80-4.5 MCG/ACT inhaler INHALE 2 PUFFS INTO THE LUNGS 2 (TWO) TIMES DAILY. 1 Inhaler 5  . pantoprazole (PROTONIX) 20 MG tablet Take 1 tablet (20 mg total) by mouth daily. 30 tablet 1  . promethazine (PHENERGAN) 25 MG tablet Take 1 tablet (25 mg total) by mouth every 8 (eight) hours as needed for nausea or vomiting. 20 tablet 0  . ipratropium-albuterol (DUONEB) 0.5-2.5 (3) MG/3ML SOLN Take 3 mLs by nebulization every 6 (six) hours as needed. (Patient not taking: Reported on 12/03/2018) 360 mL 11  . benzonatate (TESSALON) 100 MG capsule Take 1 capsule (100 mg total) by mouth every 8 (eight) hours. (Patient not taking: Reported on 12/03/2018) 15 capsule 0  . cephALEXin (KEFLEX) 500 MG capsule Take 1 capsule (500 mg total) by mouth 3 (three) times daily. (Patient not taking: Reported on 12/03/2018) 21 capsule 0  . guaiFENesin-codeine 100-10 MG/5ML syrup Take 5 mLs by mouth at bedtime. (Patient not taking: Reported on 12/03/2018) 120 mL 0  . oxyCODONE-acetaminophen (PERCOCET/ROXICET) 5-325 MG tablet Take 1-2 tablets by mouth every 6 (six) hours as needed for severe pain. (Patient not taking: Reported on 12/03/2018) 15 tablet 0  . predniSONE (STERAPRED UNI-PAK 21 TAB) 10 MG (21) TBPK tablet Take as directed (Patient not taking: Reported on 12/03/2018) 21 tablet 0  . traMADol (ULTRAM) 50 MG tablet Take 1 tablet (50 mg total) by mouth 2 (two) times daily. (Patient not taking: Reported on 12/03/2018) 10 tablet 0   No facility-administered medications prior to visit.     Allergies  Allergen Reactions  . Mobic [Meloxicam] Hives    Pt reports she gets real bad hives from Mobic  . Shrimp [Shellfish Allergy] Other (See Comments)    Wheezing.  Patient states she is ok with betadine    ROS Review of Systems  Constitutional: Negative.   HENT: Positive for ear pain and sinus pain.        Left ear tenderness  Eyes: Negative.   Respiratory: Positive for cough (occasional) and shortness of  breath (Occasional ).   Cardiovascular: Negative.   Gastrointestinal: Negative.   Endocrine: Negative.   Genitourinary: Negative.   Skin: Negative.   Allergic/Immunologic: Negative.   Neurological: Negative.   Hematological: Negative.   Psychiatric/Behavioral: Negative.       Objective:    Physical Exam  Constitutional: She is oriented to person, place, and time. She appears well-developed and  well-nourished.  HENT:  Erythema, swelling.  Bilateral ear lavage today.  Neck: Normal range of motion. Neck supple.  Cardiovascular: Normal rate, regular rhythm, normal heart sounds and intact distal pulses.  Pulmonary/Chest: Effort normal and breath sounds normal.  Abdominal: Soft. Bowel sounds are normal.  Musculoskeletal: Normal range of motion.  Neurological: She is alert and oriented to person, place, and time. She has normal reflexes.  Skin: Skin is warm and dry.  Psychiatric: She has a normal mood and affect. Her behavior is normal. Judgment and thought content normal.  Nursing note and vitals reviewed.   BP 136/90 (BP Location: Left Arm, Patient Position: Sitting, Cuff Size: Large)   Pulse 74   Temp 99.1 F (37.3 C)   Ht 5\' 2"  (1.575 m)   Wt 223 lb (101.2 kg)   SpO2 96%   BMI 40.79 kg/m  Wt Readings from Last 3 Encounters:  12/03/18 223 lb (101.2 kg)  05/21/18 222 lb (100.7 kg)  04/06/18 211 lb 13.8 oz (96.1 kg)     Health Maintenance Due  Topic Date Due  . MAMMOGRAM  11/04/2018  . COLONOSCOPY  11/04/2018    There are no preventive care reminders to display for this patient.  Lab Results  Component Value Date   TSH 1.149 05/06/2015   Lab Results  Component Value Date   WBC 4.6 05/21/2018   HGB 12.2 05/21/2018   HCT 37.4 05/21/2018   MCV 90 05/21/2018   PLT 225 05/21/2018   Lab Results  Component Value Date   NA 144 05/21/2018   K 3.8 05/21/2018   CO2 25 05/21/2018   GLUCOSE 95 05/21/2018   BUN 12 05/21/2018   CREATININE 0.78 05/21/2018    BILITOT 0.4 05/21/2018   ALKPHOS 77 05/21/2018   AST 26 05/21/2018   ALT 38 (H) 05/21/2018   PROT 6.6 05/21/2018   ALBUMIN 4.1 05/21/2018   CALCIUM 9.1 05/21/2018   ANIONGAP 9 04/06/2018   Lab Results  Component Value Date   CHOL 186 01/05/2013   Lab Results  Component Value Date   HDL 67 01/05/2013   Lab Results  Component Value Date   LDLCALC 99 01/05/2013   Lab Results  Component Value Date   TRIG 99 01/05/2013   Lab Results  Component Value Date   CHOLHDL 2.8 01/05/2013   Lab Results  Component Value Date   HGBA1C 5.8 (H) 09/30/2017      Assessment & Plan:   1. Bilateral impacted cerumen Earwax removed successfully with no complaints or concerns. Patient tolerated procedure well. Inner ear canal mild redness noted post procedure. Patient to place mineral oil onto cotton ball and insert into his ear canal weekly, to possibly prevent future incidents of earwax buildup.  - Ear wax removal  2. Sinus pressure  3. Acute ear pain, left  4. Acute suppurative otitis media of left ear without spontaneous rupture of tympanic membrane, recurrence not specified  5. Essential hypertension Blood pressure is stable today.  6. Mild intermittent asthma, uncomplicated  7. Acute otitis media, unspecified otitis media type We will initiate Augmentin today.  - amoxicillin-clavulanate (AUGMENTIN) 875-125 MG tablet; Take 1 tablet by mouth 2 (two) times daily for 7 days.  Dispense: 14 tablet; Refill: 0  8. Follow up Keep follow up appointment with Debby Bud, NP.    Problem List Items Addressed This Visit      Cardiovascular and Mediastinum   Essential hypertension    Other Visit Diagnoses    Bilateral  impacted cerumen    -  Primary   Relevant Orders   Ear wax removal   Sinus pressure       Acute ear pain, left       Acute suppurative otitis media of left ear without spontaneous rupture of tympanic membrane, recurrence not specified       Relevant Medications    amoxicillin-clavulanate (AUGMENTIN) 875-125 MG tablet   Mild intermittent asthma, uncomplicated       Acute otitis media, unspecified otitis media type       Relevant Medications   amoxicillin-clavulanate (AUGMENTIN) 875-125 MG tablet      Meds ordered this encounter  Medications  . DISCONTD: amoxicillin-clavulanate (AUGMENTIN) 875-125 MG tablet    Sig: Take 1 tablet by mouth 2 (two) times daily for 7 days.    Dispense:  14 tablet    Refill:  0  . amoxicillin-clavulanate (AUGMENTIN) 875-125 MG tablet    Sig: Take 1 tablet by mouth 2 (two) times daily for 7 days.    Dispense:  14 tablet    Refill:  0    Follow-up: No follow-ups on file.    Kallie LocksNatalie M Mardelle Pandolfi, FNP

## 2018-12-03 NOTE — Patient Instructions (Signed)
Amoxicillin; Clavulanic Acid tablets What is this medicine? AMOXICILLIN; CLAVULANIC ACID (a mox i SIL in; KLAV yoo lan ic AS id) is a penicillin antibiotic. It is used to treat certain kinds of bacterial infections. It will not work for colds, flu, or other viral infections. This medicine may be used for other purposes; ask your health care provider or pharmacist if you have questions. COMMON BRAND NAME(S): Augmentin What should I tell my health care provider before I take this medicine? They need to know if you have any of these conditions: -bowel disease, like colitis -kidney disease -liver disease -mononucleosis -an unusual or allergic reaction to amoxicillin, penicillin, cephalosporin, other antibiotics, clavulanic acid, other medicines, foods, dyes, or preservatives -pregnant or trying to get pregnant -breast-feeding How should I use this medicine? Take this medicine by mouth with a full glass of water. Follow the directions on the prescription label. Take at the start of a meal. Do not crush or chew. If the tablet has a score line, you may cut it in half at the score line for easier swallowing. Take your medicine at regular intervals. Do not take your medicine more often than directed. Take all of your medicine as directed even if you think you are better. Do not skip doses or stop your medicine early. Talk to your pediatrician regarding the use of this medicine in children. Special care may be needed. Overdosage: If you think you have taken too much of this medicine contact a poison control center or emergency room at once. NOTE: This medicine is only for you. Do not share this medicine with others. What if I miss a dose? If you miss a dose, take it as soon as you can. If it is almost time for your next dose, take only that dose. Do not take double or extra doses. What may interact with this medicine? -allopurinol -anticoagulants -birth control pills -methotrexate -probenecid This  list may not describe all possible interactions. Give your health care provider a list of all the medicines, herbs, non-prescription drugs, or dietary supplements you use. Also tell them if you smoke, drink alcohol, or use illegal drugs. Some items may interact with your medicine. What should I watch for while using this medicine? Tell your doctor or health care professional if your symptoms do not improve. Do not treat diarrhea with over the counter products. Contact your doctor if you have diarrhea that lasts more than 2 days or if it is severe and watery. If you have diabetes, you may get a false-positive result for sugar in your urine. Check with your doctor or health care professional. Birth control pills may not work properly while you are taking this medicine. Talk to your doctor about using an extra method of birth control. What side effects may I notice from receiving this medicine? Side effects that you should report to your doctor or health care professional as soon as possible: -allergic reactions like skin rash, itching or hives, swelling of the face, lips, or tongue -breathing problems -dark urine -fever or chills, sore throat -redness, blistering, peeling or loosening of the skin, including inside the mouth -seizures -trouble passing urine or change in the amount of urine -unusual bleeding, bruising -unusually weak or tired -white patches or sores in the mouth or throat Side effects that usually do not require medical attention (report to your doctor or health care professional if they continue or are bothersome): -diarrhea -dizziness -headache -nausea, vomiting -stomach upset -vaginal or anal irritation This list may   not describe all possible side effects. Call your doctor for medical advice about side effects. You may report side effects to FDA at 1-800-FDA-1088. Where should I keep my medicine? Keep out of the reach of children. Store at room temperature below 25 degrees  C (77 degrees F). Keep container tightly closed. Throw away any unused medicine after the expiration date. NOTE: This sheet is a summary. It may not cover all possible information. If you have questions about this medicine, talk to your doctor, pharmacist, or health care provider.  2019 Elsevier/Gold Standard (2007-10-09 12:04:30) Otitis Media, Adult  Otitis media means that the middle ear is red and swollen (inflamed) and full of fluid. The condition usually goes away on its own. Follow these instructions at home:  Take over-the-counter and prescription medicines only as told by your doctor.  If you were prescribed an antibiotic medicine, take it as told by your doctor. Do not stop taking the antibiotic even if you start to feel better.  Keep all follow-up visits as told by your doctor. This is important. Contact a doctor if:  You have bleeding from your nose.  There is a lump on your neck.  You are not getting better in 5 days.  You feel worse instead of better. Get help right away if:  You have pain that is not helped with medicine.  You have swelling, redness, or pain around your ear.  You get a stiff neck.  You cannot move part of your face (paralyzed).  You notice that the bone behind your ear hurts when you touch it.  You get a very bad headache. Summary  Otitis media means that the middle ear is red, swollen, and full of fluid.  This condition usually goes away on its own. In some cases, treatment may be needed.  If you were prescribed an antibiotic medicine, take it as told by your doctor. This information is not intended to replace advice given to you by your health care provider. Make sure you discuss any questions you have with your health care provider. Document Released: 01/02/2008 Document Revised: 08/06/2016 Document Reviewed: 08/06/2016 Elsevier Interactive Patient Education  2019 Else[employer] Inc[employer]  

## 2018-12-04 DIAGNOSIS — H9202 Otalgia, left ear: Secondary | ICD-10-CM | POA: Insufficient documentation

## 2018-12-04 DIAGNOSIS — H6690 Otitis media, unspecified, unspecified ear: Secondary | ICD-10-CM | POA: Insufficient documentation

## 2018-12-05 ENCOUNTER — Ambulatory Visit: Payer: BLUE CROSS/BLUE SHIELD | Admitting: Family Medicine

## 2019-01-13 ENCOUNTER — Other Ambulatory Visit: Payer: Self-pay

## 2019-01-13 ENCOUNTER — Telehealth: Payer: Self-pay | Admitting: Family Medicine

## 2019-01-13 DIAGNOSIS — J4520 Mild intermittent asthma, uncomplicated: Secondary | ICD-10-CM

## 2019-01-13 MED ORDER — ALBUTEROL SULFATE HFA 108 (90 BASE) MCG/ACT IN AERS: INHALATION_SPRAY | RESPIRATORY_TRACT | 3 refills | Status: DC

## 2019-01-13 MED ORDER — SYMBICORT 80-4.5 MCG/ACT IN AERO: INHALATION_SPRAY | RESPIRATORY_TRACT | 5 refills | Status: DC

## 2019-01-13 NOTE — Telephone Encounter (Signed)
Left a detail vm  

## 2019-01-22 ENCOUNTER — Other Ambulatory Visit: Payer: Self-pay | Admitting: Family Medicine

## 2019-01-22 DIAGNOSIS — J4520 Mild intermittent asthma, uncomplicated: Secondary | ICD-10-CM

## 2019-01-22 MED FILL — ALBUTEROL SULFATE HFA 108 (: 108 (90 BAS | 18 days supply | Qty: 18 | Fill #0

## 2019-02-06 ENCOUNTER — Other Ambulatory Visit: Payer: Self-pay | Admitting: Family Medicine

## 2019-02-06 DIAGNOSIS — J4520 Mild intermittent asthma, uncomplicated: Secondary | ICD-10-CM

## 2019-02-06 MED FILL — SYMBICORT 80-4.5 MCG INH: 80-4.5 | 30 days supply | Qty: 10 | Fill #1

## 2019-02-12 ENCOUNTER — Ambulatory Visit (INDEPENDENT_AMBULATORY_CARE_PROVIDER_SITE_OTHER): Payer: BC Managed Care – PPO | Admitting: Podiatry

## 2019-02-12 ENCOUNTER — Encounter: Payer: Self-pay | Admitting: Podiatry

## 2019-02-12 ENCOUNTER — Encounter

## 2019-02-12 ENCOUNTER — Ambulatory Visit (INDEPENDENT_AMBULATORY_CARE_PROVIDER_SITE_OTHER): Payer: BC Managed Care – PPO

## 2019-02-12 ENCOUNTER — Other Ambulatory Visit: Payer: Self-pay

## 2019-02-12 VITALS — Temp 98.3°F

## 2019-02-12 DIAGNOSIS — M722 Plantar fascial fibromatosis: Secondary | ICD-10-CM

## 2019-02-12 DIAGNOSIS — M7732 Calcaneal spur, left foot: Secondary | ICD-10-CM

## 2019-02-12 MED ORDER — IBUPROFEN 800 MG PO TABS: 800.0000 mg | ORAL_TABLET | Freq: Three times a day (TID) | ORAL | 0 refills | Status: DC | PRN

## 2019-02-12 NOTE — Progress Notes (Signed)
Subjective: 48 year old female presents the office today for concerns of her left heel hurting.  She states after the surgery she was doing great but she is been working longer hours at work and she is on concrete floors with steel toe shoes and she gets pain in the bottom of the heel.  She also states her morning she started to get more stiffness and discomfort in the morning to get better with activity.  She denies recent injury or trauma.  No numbness or tingling.  No radiating pain.  No weakness. Denies any systemic complaints such as fevers, chills, nausea, vomiting. No acute changes since last appointment, and no other complaints at this time.   Objective: AAO x3, NAD DP/PT pulses palpable bilaterally, CRT less than 3 seconds On today's exam there is no tenderness palpation on the plantar medial tubercle of the calcaneus at the insertion of the plantar fascia.  Plantar fascia.  No pain with lateral compression of the calcaneus.  No pain in the Achilles tendon.  Tenderness.  Intact.  No edema, erythema.  Negative Tinel sign. No open lesions or pre-ulcerative lesions.  No pain with calf compression, swelling, warmth, erythema  Assessment: Recurrent left foot plantar fasciitis  Plan: -All treatment options discussed with the patient including all alternatives, risks, complications.  -X-rays were obtained reviewed.  Inferior calcaneal spurs present.  No evidence of acute fracture -To be wanted to talk about the steel toed shoes.  She is going to look at getting a Red wing shoe.  Continue stretching, icing daily.  New night splint was dispensed.  Prescribed ibuprofen 800 mg to take as needed. -Patient encouraged to call the office with any questions, concerns, change in symptoms.    Trula Slade DPM

## 2019-02-12 NOTE — Patient Instructions (Signed)

## 2019-03-09 ENCOUNTER — Encounter: Payer: Self-pay | Admitting: Podiatry

## 2019-03-09 ENCOUNTER — Ambulatory Visit (INDEPENDENT_AMBULATORY_CARE_PROVIDER_SITE_OTHER): Payer: BC Managed Care – PPO | Admitting: Podiatry

## 2019-03-09 ENCOUNTER — Other Ambulatory Visit: Payer: Self-pay

## 2019-03-09 VITALS — Temp 97.2°F

## 2019-03-09 DIAGNOSIS — M722 Plantar fascial fibromatosis: Secondary | ICD-10-CM

## 2019-03-09 MED ORDER — IBUPROFEN 800 MG PO TABS: 800.0000 mg | ORAL_TABLET | Freq: Three times a day (TID) | ORAL | 0 refills | Status: DC | PRN

## 2019-03-09 MED ORDER — TRAMADOL HCL 50 MG PO TABS: 50.0000 mg | ORAL_TABLET | Freq: Three times a day (TID) | ORAL | 0 refills | Status: AC | PRN

## 2019-03-09 NOTE — Patient Instructions (Signed)
Tramadol tablets What is this medicine? TRAMADOL (TRA ma dole) is a pain reliever. It is used to treat moderate to severe pain in adults. This medicine may be used for other purposes; ask your health care provider or pharmacist if you have questions. COMMON BRAND NAME(S): Ultram What should I tell my health care provider before I take this medicine? They need to know if you have any of these conditions:  brain tumor  depression  drug abuse or addiction  head injury  if you frequently drink alcohol containing drinks  kidney disease or trouble passing urine  liver disease  lung disease, asthma, or breathing problems  seizures or epilepsy  suicidal thoughts, plans, or attempt; a previous suicide attempt by you or a family member  an unusual or allergic reaction to tramadol, codeine, other medicines, foods, dyes, or preservatives  pregnant or trying to get pregnant  breast-feeding How should I use this medicine? Take this medicine by mouth with a full glass of water. Follow the directions on the prescription label. You can take it with or without food. If it upsets your stomach, take it with food. Do not take your medicine more often than directed. A special MedGuide will be given to you by the pharmacist with each prescription and refill. Be sure to read this information carefully each time. Talk to your pediatrician regarding the use of this medicine in children. Special care may be needed. Overdosage: If you think you have taken too much of this medicine contact a poison control center or emergency room at once. NOTE: This medicine is only for you. Do not share this medicine with others. What if I miss a dose? If you miss a dose, take it as soon as you can. If it is almost time for your next dose, take only that dose. Do not take double or extra doses. What may interact with this medicine? Do not take this medication with any of the following medicines:  MAOIs like Carbex,  Eldepryl, Marplan, Nardil, and Parnate This medicine may also interact with the following medications:  alcohol  antihistamines for allergy, cough and cold  certain medicines for anxiety or sleep  certain medicines for depression like amitriptyline, fluoxetine, sertraline  certain medicines for migraine headache like almotriptan, eletriptan, frovatriptan, naratriptan, rizatriptan, sumatriptan, zolmitriptan  certain medicines for seizures like carbamazepine, oxcarbazepine, phenobarbital, primidone  certain medicines that treat or prevent blood clots like warfarin  digoxin  furazolidone  general anesthetics like halothane, isoflurane, methoxyflurane, propofol  linezolid  local anesthetics like lidocaine, pramoxine, tetracaine  medicines that relax muscles for surgery  other narcotic medicines for pain or cough  phenothiazines like chlorpromazine, mesoridazine, prochlorperazine, thioridazine  procarbazine This list may not describe all possible interactions. Give your health care provider a list of all the medicines, herbs, non-prescription drugs, or dietary supplements you use. Also tell them if you smoke, drink alcohol, or use illegal drugs. Some items may interact with your medicine. What should I watch for while using this medicine? Tell your doctor or health care provider if your pain does not go away, if it gets worse, or if you have new or a different type of pain. You may develop tolerance to the medicine. Tolerance means that you will need a higher dose of the medicine for pain relief. Tolerance is normal and is expected if you take this medicine for a long time. This medicine may cause serious skin reactions. They can happen weeks to months after starting the medicine. Contact   your health care provider right away if you notice fevers or flu-like symptoms with a rash. The rash may be red or purple and then turn into blisters or peeling of the skin. Or, you might notice a  red rash with swelling of the face, lips or lymph nodes in your neck or under your arms. Do not suddenly stop taking your medicine because you may develop a severe reaction. Your body becomes used to the medicine. This does NOT mean you are addicted. Addiction is a behavior related to getting and using a drug for a non-medical reason. If you have pain, you have a medical reason to take pain medicine. Your doctor will tell you how much medicine to take. If your doctor wants you to stop the medicine, the dose will be slowly lowered over time to avoid any side effects. There are different types of narcotic medicines (opiates). If you take more than one type at the same time or if you are taking another medicine that also causes drowsiness, you may have more side effects. Give your health care provider a list of all medicines you use. Your doctor will tell you how much medicine to take. Do not take more medicine than directed. Call emergency for help if you have problems breathing or unusual sleepiness. You may get drowsy or dizzy. Do not drive, use machinery, or do anything that needs mental alertness until you know how this medicine affects you. Do not stand or sit up quickly, especially if you are an older patient. This reduces the risk of dizzy or fainting spells. Alcohol can increase or decrease the effects of this medicine. Avoid alcoholic drinks. You may have constipation. Try to have a bowel movement at least every 2 to 3 days. If you do not have a bowel movement for 3 days, call your doctor or health care provider. Your mouth may get dry. Chewing sugarless gum or sucking hard candy, and drinking plenty of water may help. Contact your doctor if the problem does not go away or is severe. What side effects may I notice from receiving this medicine? Side effects that you should report to your doctor or health care professional as soon as possible:  allergic reactions like skin rash, itching or hives,  swelling of the face, lips, or tongue  breathing problems  confusion  redness, blistering, peeling or loosening of the skin, including inside the mouth  seizures  signs and symptoms of low blood pressure like dizziness; feeling faint or lightheaded, falls; unusually weak or tired  trouble passing urine or change in the amount of urine Side effects that usually do not require medical attention (report to your doctor or health care professional if they continue or are bothersome):  constipation  dry mouth  nausea, vomiting  tiredness This list may not describe all possible side effects. Call your doctor for medical advice about side effects. You may report side effects to FDA at 1-800-FDA-1088. Where should I keep my medicine? Keep out of the reach of children. This medicine may cause accidental overdose and death if it taken by other adults, children, or pets. Mix any unused medicine with a substance like cat litter or coffee grounds. Then throw the medicine away in a sealed container like a sealed bag or a coffee can with a lid. Do not use the medicine after the expiration date. Store at room temperature between 15 and 30 degrees C (59 and 86 degrees F). NOTE: This sheet is a summary. It may   not cover all possible information. If you have questions about this medicine, talk to your doctor, pharmacist, or health care provider.  2020 Elsevier/Gold Standard (2018-10-24 15:56:48)  

## 2019-03-11 ENCOUNTER — Telehealth: Payer: Self-pay

## 2019-03-11 NOTE — Telephone Encounter (Signed)
Called, no answer. Left a message for patient to call back. Thanks!  

## 2019-03-11 NOTE — Progress Notes (Signed)
Subjective: 48 year old female presents the office today for follow-up evaluation left heel pain, plantar fasciitis.  She said that she did recently change her shoes and this is been very helpful for her.  She states that she also has been taking tramadol occasionally for pain.  Overall she is doing significantly better.  She is asking for a new brace today as well. Denies any systemic complaints such as fevers, chills, nausea, vomiting. No acute changes since last appointment, and no other complaints at this time.   Objective: AAO x3, NAD DP/PT pulses palpable bilaterally, CRT less than 3 seconds There is currently no tenderness palpation on the plantar medial tubercle of the calcaneus insertion upon the fascia.  The surgical site is well-healed.  No pain with lateral compression of calcaneus.  No edema, erythema.  Negative Tinel sign. No open lesions or pre-ulcerative lesions.  No pain with calf compression, swelling, warmth, erythema  Assessment: Resolving left foot plantar fasciitis  Plan: -All treatment options discussed with the patient including all alternatives, risks, complications.  -Overall she is doing much better.  Continue with shoe modification discussed orthotics.  Plantar fascial brace was dispensed.  Ibuprofen as needed refilled.  Prescribed tramadol for pain discussed she cannot work while taking pain medication.  I want her to wean off of needing tramadol.  Trula Slade DPM

## 2019-03-17 ENCOUNTER — Other Ambulatory Visit: Payer: Self-pay

## 2019-03-17 ENCOUNTER — Encounter: Payer: Self-pay | Admitting: Family Medicine

## 2019-03-17 ENCOUNTER — Ambulatory Visit (INDEPENDENT_AMBULATORY_CARE_PROVIDER_SITE_OTHER): Payer: BLUE CROSS/BLUE SHIELD | Admitting: Family Medicine

## 2019-03-17 VITALS — BP 140/94 | HR 73 | Temp 99.0°F | Resp 16 | Ht 62.0 in | Wt 220.0 lb

## 2019-03-17 DIAGNOSIS — R739 Hyperglycemia, unspecified: Secondary | ICD-10-CM

## 2019-03-17 DIAGNOSIS — J3089 Other allergic rhinitis: Secondary | ICD-10-CM | POA: Diagnosis not present

## 2019-03-17 DIAGNOSIS — G4709 Other insomnia: Secondary | ICD-10-CM

## 2019-03-17 DIAGNOSIS — I10 Essential (primary) hypertension: Secondary | ICD-10-CM

## 2019-03-17 LAB — POCT URINALYSIS DIPSTICK
Bilirubin, UA: NEGATIVE
Blood, UA: NEGATIVE
Glucose, UA: NEGATIVE
Ketones, UA: NEGATIVE
Leukocytes, UA: NEGATIVE
Nitrite, UA: NEGATIVE
Protein, UA: POSITIVE — AB
Spec Grav, UA: 1.02 (ref 1.010–1.025)
Urobilinogen, UA: 1 E.U./dL
pH, UA: 7 (ref 5.0–8.0)

## 2019-03-17 LAB — POCT GLYCOSYLATED HEMOGLOBIN (HGB A1C): Hemoglobin A1C: 5.6 % (ref 4.0–5.6)

## 2019-03-17 MED ORDER — ZOLPIDEM TARTRATE 5 MG PO TABS: 5.0000 mg | ORAL_TABLET | Freq: Every evening | ORAL | 1 refills | Status: DC | PRN

## 2019-03-17 MED ORDER — AMLODIPINE BESYLATE 5 MG PO TABS: 5.0000 mg | ORAL_TABLET | Freq: Every day | ORAL | 5 refills | Status: DC

## 2019-03-17 MED ORDER — CETIRIZINE HCL 10 MG PO TABS: 10.0000 mg | ORAL_TABLET | Freq: Every day | ORAL | 11 refills | Status: DC

## 2019-03-17 NOTE — Progress Notes (Signed)
Established Patient Office Visit  Subjective:  Patient ID: Wendy James, female    DOB: 07/05/1971  Age: 48 y.o. MRN: [id number]  CC:  Chief Complaint  Patient presents with  . Insomnia    unable to fall asleep   . Allergies    singular  not helping allergies any longer   . 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  She has previously been evaluated here for asthma.  Haywood LassoLynette states that her asthma has been controlled over the past year.  She has continued Symbicort daily without interruption.  Also, albuterol hand-held and/or nebulizer as needed.  She is not been hospitalized for asthma exacerbation over the past 6 months.  Patient continues to take Singulair daily.  However, she states that symptoms of allergic rhinitis have increased.  She endorses runny nose, eye itching, and itching to throat.  Patient has been taking Benadryl OTC consistently without sustained relief.  She denies headache, sore throat, shortness of breath, wheezing, chest pain, persistent cough, ear pain, or sinusitis.   Patient is also complaining of anxiety.  Patient states that her mother recently had hip surgery and she has been her primary caretaker.  She also states that she has been working a full-time job consistently.  She reports the following symptoms of anxiety: Feelings of losing control, and racing thoughts.  She also endorses insomnia.  She has difficulty falling asleep and staying asleep.  Patient has not been treated for anxiety in the past.  She denies symptoms of depression.  She also denies suicidal or homicidal ideations.  Symptoms of anxiety and insomnia have been present for greater than 1 month.  Patient also has a history of essential hypertension.  She has been taking amlodipine  consistently without interruption.  Haywood LassoLynette does not check blood pressures at home.  She is currently not exercising or adherent to a low-salt diet.  She currently denies chest pain, shortness of breath, heart palpitations, bilateral lower extremity edema, blurred vision, or dizziness.  Patient's cardiovascular risk factors include, obesity and former smoker.  Past Medical History:  Diagnosis Date  . 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 Asthma   . Bone spur of foot   . Seasonal allergies     Past Surgical History:  Procedure Laterality Date  . CESAREAN SECTION     x 4  . CHOLECYSTECTOMY    . HEEL SPUR EXCISION     08/2017  . HYSTEROSCOPY W/D&C N/A 02/18/2015   Procedure: DILATATION AND CURETTAGE /HYSTEROSCOPY;  Surgeon: Allie BossierMyra C Dove, MD;  Location: [facility];  Service: Gynecology;  Laterality: N/A;  . TUBAL LIGATION      Family History  Problem Relation Age of Onset  . Hypertension Mother   . Stroke Sister   . Seizures Sister     Social History   Socioeconomic History  . Marital status: Married    Spouse name: Not on file  . Number of children: Not on file  . Years of education: Not on file  . Highest education level: Not on file  Occupational History  . Not on file  Social Needs  . Financial resource strain: Not on file  . Food insecurity    Worry: Not on file    Inability: Not on  file  . Transportation needs    Medical: Not on file    Non-medical: Not on file  Tobacco Use  . Smoking status: Former Smoker    Packs/day: 0.75    Types: Cigarettes    Quit date: 11/17/1988    Years since quitting: 30.3  . Smokeless tobacco: Never Used  Substance and Sexual Activity  . Alcohol use: No  . Drug use: No  . Sexual activity: Yes    Birth control/protection: Surgical  Lifestyle  . Physical activity    Days per week: Not on file    Minutes per session: Not on file  . Stress: Not on file  Relationships  . Social Musicianconnections    Talks on phone: Not on file    Gets together:  Not on file    Attends religious service: Not on file    Active member of club or organization: Not on file    Attends meetings of clubs or organizations: Not on file    Relationship status: Not on file  . Intimate partner violence    Fear of current or ex partner: Not on file    Emotionally abused: Not on file    Physically abused: Not on file    Forced sexual activity: Not on file  Other Topics Concern  . Not on file  Social History Narrative  . Not on file    Outpatient Medications Prior to Visit  Medication Sig Dispense Refill  . albuterol (PROVENTIL) (5 MG/ML) 0.5% nebulizer solution Take 0.5 mLs (2.5 mg total) by nebulization every 6 (six) hours as needed for wheezing or shortness of breath. 20 mL 0  . albuterol (VENTOLIN HFA) 108 (90 Base) MCG/ACT inhaler INHALE 1-2 PUFFS INTO THE LUNGS EVERY 6 (SIX) HOURS AS NEEDED FOR WHEEZING OR SHORTNESS OF BREATH. 18 g 0  . amLODipine (NORVASC) 5 MG tablet Take 1 tablet (5 mg total) by mouth daily. 30 tablet 5  . ibuprofen (ADVIL) 800 MG tablet Take 1 tablet (800 mg total) by mouth every 8 (eight) hours as needed. 30 tablet 0  . montelukast (SINGULAIR) 10 MG tablet Take 1 tablet (10 mg total) by mouth at bedtime. 30 tablet 3  . SYMBICORT 80-4.5 MCG/ACT inhaler INHALE 2 PUFFS INTO THE LUNGS 2 (TWO) TIMES DAILY. 1 Inhaler 5  . gabapentin (NEURONTIN) 100 MG capsule Take 1 capsule (100 mg total) by mouth at bedtime. (Patient not taking: Reported on 03/17/2019) 30 capsule 3  . ipratropium-albuterol (DUONEB) 0.5-2.5 (3) MG/3ML SOLN Take 3 mLs by nebulization every 6 (six) hours as needed. (Patient not taking: Reported on 12/03/2018) 360 mL 11   No facility-administered medications prior to visit.     Allergies  Allergen Reactions  . Mobic [Meloxicam] Hives    Pt reports she gets real bad hives from Mobic  . Shrimp [Shellfish Allergy] Other (See Comments)    Wheezing.  Patient states she is ok with betadine    ROS Review of Systems   Constitutional: Negative for fatigue.  HENT: Negative.   Respiratory: Negative for chest tightness.   Cardiovascular: Negative for chest pain.  Genitourinary: Negative.   Neurological: Negative.   Psychiatric/Behavioral: Negative.       Objective:    Physical Exam  Constitutional: She is oriented to person, place, and time. She appears well-developed and well-nourished.  Eyes: Pupils are equal, round, and reactive to light.  Neck: Normal range of motion.  Cardiovascular: Normal rate and regular rhythm.  Pulmonary/Chest: Effort normal.  Abdominal:  Soft. Bowel sounds are normal.  Musculoskeletal: Normal range of motion.  Neurological: She is alert and oriented to person, place, and time.  Skin: Skin is warm and dry.  Psychiatric: She has a normal mood and affect. Her behavior is normal. Judgment and thought content normal.    BP (!) 140/94 (BP Location: Left Arm, Patient Position: Sitting, Cuff Size: Large)   Pulse 73   Temp 99 F (37.2 C) (Oral)   Resp 16   Ht 5\' 2"  (1.575 m)   Wt 220 lb (99.8 kg)   SpO2 99%   BMI 40.24 kg/m  Wt Readings from Last 3 Encounters:  03/17/19 220 lb (99.8 kg)  12/03/18 223 lb (101.2 kg)  05/21/18 222 lb (100.7 kg)     Health Maintenance Due  Topic Date Due  . MAMMOGRAM  11/04/2018  . COLONOSCOPY  11/04/2018    There are no preventive care reminders to display for this patient.  Lab Results  Component Value Date   TSH 1.149 05/06/2015   Lab Results  Component Value Date   WBC 4.6 05/21/2018   HGB 12.2 05/21/2018   HCT 37.4 05/21/2018   MCV 90 05/21/2018   PLT 225 05/21/2018   Lab Results  Component Value Date   NA 144 05/21/2018   K 3.8 05/21/2018   CO2 25 05/21/2018   GLUCOSE 95 05/21/2018   BUN 12 05/21/2018   CREATININE 0.78 05/21/2018   BILITOT 0.4 05/21/2018   ALKPHOS 77 05/21/2018   AST 26 05/21/2018   ALT 38 (H) 05/21/2018   PROT 6.6 05/21/2018   ALBUMIN 4.1 05/21/2018   CALCIUM 9.1 05/21/2018   ANIONGAP  9 04/06/2018   Lab Results  Component Value Date   CHOL 186 01/05/2013   Lab Results  Component Value Date   HDL 67 01/05/2013   Lab Results  Component Value Date   LDLCALC 99 01/05/2013   Lab Results  Component Value Date   TRIG 99 01/05/2013   Lab Results  Component Value Date   CHOLHDL 2.8 01/05/2013   Lab Results  Component Value Date   HGBA1C 5.8 (H) 09/30/2017      Assessment & Plan:   Problem List Items Addressed This Visit      Cardiovascular and Mediastinum   Essential hypertension   Relevant Medications   amLODipine (NORVASC) 5 MG tablet   Other Relevant Orders   Basic Metabolic Panel (Completed)   Urinalysis Dipstick (Completed)    Other Visit Diagnoses    Non-seasonal allergic rhinitis, unspecified trigger    -  Primary   Relevant Medications   cetirizine (ZYRTEC) 10 MG tablet   Other insomnia       Relevant Medications   zolpidem (AMBIEN) 5 MG tablet   Hyperglycemia       Relevant Orders   HgB A1c (Completed)    Essential hypertension - Continue medication, monitor blood pressure at home. Continue DASH diet. Reminder to go to the ER if any CP, SOB, nausea, dizziness, severe HA, changes vision/speech, left arm numbness and tingling and jaw pain.  - amLODipine (NORVASC) 5 MG tablet; Take 1 tablet (5 mg total) by mouth daily.  Dispense: 30 tablet; Refill: 5 - Basic Metabolic Panel - Urinalysis Dipstick  Non-seasonal allergic rhinitis, unspecified trigger Discussed symptoms of allergic rhinitis at length.  Patient will continue Singulair 10 mg daily.  Also will add Zyrtec 10 mg daily.  Recommended patient discontinue OTC Benadryl. - cetirizine (ZYRTEC) 10 MG tablet; Take 1  tablet (10 mg total) by mouth daily.  Dispense: 30 tablet; Refill: 11  Other insomnia We will start trial of Ambien 5 mg at bedtime as needed for sleep.  Discussed insomnia at length. Practice good sleep hygiene.  Stick to a sleep schedule, even on weekends. Exercise is  great, but not too late in the day Avoid alcoholic drinks before bed Avoid large meals and beverages late before bed Don't take naps after 3 pm. Keep power naps less than 1 hour.  Relax before bed.  Take a hot bath before bed.  Have a good sleeping environment. Get rid of anything in your bedroom that might distract you from sleep.  Adopt good sleeping posture.   - zolpidem (AMBIEN) 5 MG tablet; Take 1 tablet (5 mg total) by mouth at bedtime as needed for sleep.  Dispense: 30 tablet; Refill: 1  Hyperglycemia Hemoglobin A1c 5.6.  Patient advised to start a low carbohydrate diet divided over 5-6 small meals throughout the day to assist with weight gain and glycemic control.  Will recheck hemoglobin A1c in 1 year. - HgB A1c Follow-up:  Patient will follow-up in 3 months for anxiety or as needed.  Nolon NationsLachina Moore Lucyann Romano  APRN, MSN, FNP-C Patient Care [facility] [address]  [place], [place] [id number] [phone]

## 2019-03-17 NOTE — Patient Instructions (Addendum)
Asthma, Adult  Asthma is a long-term (chronic) condition in which the airways get tight and narrow. The airways are the breathing passages that lead from the nose and mouth down into the lungs. A person with asthma will have times when symptoms get worse. These are called asthma attacks. They can cause coughing, whistling sounds when you breathe (wheezing), shortness of breath, and chest pain. They can make it hard to breathe. There is no cure for asthma, but medicines and lifestyle changes can help control it. There are many things that can bring on an asthma attack or make asthma symptoms worse (triggers). Common triggers include:  Mold.  Dust.  Cigarette smoke.  Cockroaches.  Things that can cause allergy symptoms (allergens). These include animal skin flakes (dander) and pollen from trees or grass.  Things that pollute the air. These may include household cleaners, wood smoke, smog, or chemical odors.  Cold air, weather changes, and wind.  Crying or laughing hard.  Stress.  Certain medicines or drugs.  Certain foods such as dried fruit, potato chips, and grape juice.  Infections, such as a cold or the flu.  Certain medical conditions or diseases.  Exercise or tiring activities. Asthma may be treated with medicines and by staying away from the things that cause asthma attacks. Types of medicines may include:  Controller medicines. These help prevent asthma symptoms. They are usually taken every day.  Fast-acting reliever or rescue medicines. These quickly relieve asthma symptoms. They are used as needed and provide short-term relief.  Allergy medicines if your attacks are brought on by allergens.  Medicines to help control the body's defense (immune) system. Follow these instructions at home: Avoiding triggers in your home  Change your heating and air conditioning filter often.  Limit your use of fireplaces and wood stoves.  Get rid of pests (such as roaches and  mice) and their droppings.  Throw away plants if you see mold on them.  Clean your floors. Dust regularly. Use cleaning products that do not smell.  Have someone vacuum when you are not home. Use a vacuum cleaner with a HEPA filter if possible.  Replace carpet with wood, tile, or vinyl flooring. Carpet can trap animal skin flakes and dust.  Use allergy-proof pillows, mattress covers, and box spring covers.  Wash bed sheets and blankets every week in hot water. Dry them in a dryer.  Keep your bedroom free of any triggers.  Avoid pets and keep windows closed when things that cause allergy symptoms are in the air.  Use blankets that are made of polyester or cotton.  Clean bathrooms and kitchens with bleach. If possible, have someone repaint the walls in these rooms with mold-resistant paint. Keep out of the rooms that are being cleaned and painted.  Wash your hands often with soap and water. If soap and water are not available, use hand sanitizer.  Do not allow anyone to smoke in your home. General instructions  Take over-the-counter and prescription medicines only as told by your doctor. ? Talk with your doctor if you have questions about how or when to take your medicines. ? Make note if you need to use your medicines more often than usual.  Do not use any products that contain nicotine or tobacco, such as cigarettes and e-cigarettes. If you need help quitting, ask your doctor.  Stay away from secondhand smoke.  Avoid doing things outdoors when allergen counts are high and when air quality is low.  Wear a ski mask   when doing outdoor activities in the winter. The mask should cover your nose and mouth. Exercise indoors on cold days if you can.  Warm up before you exercise. Take time to cool down after exercise.  Use a peak flow meter as told by your doctor. A peak flow meter is a tool that measures how well the lungs are working.  Keep track of the peak flow meter's readings.  Write them down.  Follow your asthma action plan. This is a written plan for taking care of your asthma and treating your attacks.  Make sure you get all the shots (vaccines) that your doctor recommends. Ask your doctor about a flu shot and a pneumonia shot.  Keep all follow-up visits as told by your doctor. This is important. Contact a doctor if:  You have wheezing, shortness of breath, or a cough even while taking medicine to prevent attacks.  The mucus you cough up (sputum) is thicker than usual.  The mucus you cough up changes from clear or white to yellow, green, gray, or bloody.  You have problems from the medicine you are taking, such as: ? A rash. ? Itching. ? Swelling. ? Trouble breathing.  You need reliever medicines more than 2-3 times a week.  Your peak flow reading is still at 50-79% of your personal best after following the action plan for 1 hour.  You have a fever. Get help right away if:  You seem to be worse and are not responding to medicine during an asthma attack.  You are short of breath even at rest.  You get short of breath when doing very little activity.  You have trouble eating, drinking, or talking.  You have chest pain or tightness.  You have a fast heartbeat.  Your lips or fingernails start to turn blue.  You are light-headed or dizzy, or you faint.  Your peak flow is less than 50% of your personal best.  You feel too tired to breathe normally. Summary  Asthma is a long-term (chronic) condition in which the airways get tight and narrow. An asthma attack can make it hard to breathe.  Asthma cannot be cured, but medicines and lifestyle changes can help control it.  Make sure you understand how to avoid triggers and how and when to use your medicines. This information is not intended to replace advice given to you by your health care provider. Make sure you discuss any questions you have with your health care provider. Document  Released: 01/02/2008 Document Revised: 09/18/2018 Document Reviewed: 08/20/2016 Elsevier Patient Education  2020 Elsevier Inc.  Insomnia Insomnia is a sleep disorder that makes it difficult to fall asleep or stay asleep. Insomnia can cause fatigue, low energy, difficulty concentrating, mood swings, and poor performance at work or school. There are three different ways to classify insomnia:  Difficulty falling asleep.  Difficulty staying asleep.  Waking up too early in the morning. Any type of insomnia can be long-term (chronic) or short-term (acute). Both are common. Short-term insomnia usually lasts for three months or less. Chronic insomnia occurs at least three times a week for longer than three months. What are the causes? Insomnia may be caused by another condition, situation, or substance, such as:  Anxiety.  Certain medicines.  Gastroesophageal reflux disease (GERD) or other gastrointestinal conditions.  Asthma or other breathing conditions.  Restless legs syndrome, sleep apnea, or other sleep disorders.  Chronic pain.  Menopause.  Stroke.  Abuse of alcohol, tobacco, or illegal drugs.  Mental health conditions, such as depression.  Caffeine.  Neurological disorders, such as Alzheimer's disease.  An overactive thyroid (hyperthyroidism). Sometimes, the cause of insomnia may not be known. What increases the risk? Risk factors for insomnia include:  Gender. Women are affected more often than men.  Age. Insomnia is more common as you get older.  Stress.  Lack of exercise.  Irregular work schedule or working night shifts.  Traveling between different time zones.  Certain medical and mental health conditions. What are the signs or symptoms? If you have insomnia, the main symptom is having trouble falling asleep or having trouble staying asleep. This may lead to other symptoms, such as:  Feeling fatigued or having low energy.  Feeling nervous about going  to sleep.  Not feeling rested in the morning.  Having trouble concentrating.  Feeling irritable, anxious, or depressed. How is this diagnosed? This condition may be diagnosed based on:  Your symptoms and medical history. Your health care provider may ask about: ? Your sleep habits. ? Any medical conditions you have. ? Your mental health.  A physical exam. How is this treated? Treatment for insomnia depends on the cause. Treatment may focus on treating an underlying condition that is causing insomnia. Treatment may also include:  Medicines to help you sleep.  Counseling or therapy.  Lifestyle adjustments to help you sleep better. Follow these instructions at home: Eating and drinking   Limit or avoid alcohol, caffeinated beverages, and cigarettes, especially close to bedtime. These can disrupt your sleep.  Do not eat a large meal or eat spicy foods right before bedtime. This can lead to digestive discomfort that can make it hard for you to sleep. Sleep habits   Keep a sleep diary to help you and your health care provider figure out what could be causing your insomnia. Write down: ? When you sleep. ? When you wake up during the night. ? How well you sleep. ? How rested you feel the next day. ? Any side effects of medicines you are taking. ? What you eat and drink.  Make your bedroom a dark, comfortable place where it is easy to fall asleep. ? Put up shades or blackout curtains to block light from outside. ? Use a white noise machine to block noise. ? Keep the temperature cool.  Limit screen use before bedtime. This includes: ? Watching TV. ? Using your smartphone, tablet, or computer.  Stick to a routine that includes going to bed and waking up at the same times every day and night. This can help you fall asleep faster. Consider making a quiet activity, such as reading, part of your nighttime routine.  Try to avoid taking naps during the day so that you sleep better  at night.  Get out of bed if you are still awake after 15 minutes of trying to sleep. Keep the lights down, but try reading or doing a quiet activity. When you feel sleepy, go back to bed. General instructions  Take over-the-counter and prescription medicines only as told by your health care provider.  Exercise regularly, as told by your health care provider. Avoid exercise starting several hours before bedtime.  Use relaxation techniques to manage stress. Ask your health care provider to suggest some techniques that may work well for you. These may include: ? Breathing exercises. ? Routines to release muscle tension. ? Visualizing peaceful scenes.  Make sure that you drive carefully. Avoid driving if you feel very sleepy.  Keep all follow-up visits  as told by your health care provider. This is important. Contact a health care provider if:  You are tired throughout the day.  You have trouble in your daily routine due to sleepiness.  You continue to have sleep problems, or your sleep problems get worse. Get help right away if:  You have serious thoughts about hurting yourself or someone else. If you ever feel like you may hurt yourself or others, or have thoughts about taking your own life, get help right away. You can go to your nearest emergency department or call:  Your local emergency services (911 in the [place]  A suicide crisis helpline, such as the National Suicide Prevention Lifeline at 940 730 [address]001-978 499 7506. This is open 24 hours a day. Summary  Insomnia is a sleep disorder that makes it difficult to fall asleep or stay asleep.  Insomnia can be long-term (chronic) or short-term (acute).  Treatment for insomnia depends on the cause. Treatment may focus on treating an underlying condition that is causing insomnia.  Keep a sleep diary to help you and your health care provider figure out what could be causing your insomnia. This information is not intended to replace advice  given to you by your health care provider. Make sure you discuss any questions you have with your health care provider. Document Released: 07/13/2000 Document Revised: 06/28/2017 Document Reviewed: 04/25/2017 Elsevier Patient Education  2020 [employer]

## 2019-03-18 ENCOUNTER — Telehealth: Payer: Self-pay | Admitting: Family Medicine

## 2019-03-18 LAB — BASIC METABOLIC PANEL
BUN/Creatinine Ratio: 16 (ref 9–23)
BUN: 11 mg/dL (ref 6–24)
CO2: 26 mmol/L (ref 20–29)
Calcium: 9.1 mg/dL (ref 8.7–10.2)
Chloride: 104 mmol/L (ref 96–106)
Creatinine, Ser: 0.69 mg/dL (ref 0.57–1.00)
GFR calc Af Amer: 117 mL/min/{1.73_m2} (ref >59)
GFR calc non Af Amer: 102 mL/min/{1.73_m2} (ref >59)
Glucose: 102 mg/dL — ABNORMAL HIGH (ref 65–99)
Potassium: 4.1 mmol/L (ref 3.5–5.2)
Sodium: 142 mmol/L (ref 134–144)

## 2019-03-18 NOTE — Telephone Encounter (Signed)
Called, no answer. Left a message that dosage can not be adjusted at this time, she needs to take medication for a few days and get it in system to see how it works. Asked to call back if any questions. Thanks!

## 2019-03-18 NOTE — Telephone Encounter (Signed)
-----   Message from Dorena Dew, [place] sent at 03/18/2019 11:55 AM EDT ----- Regarding: Medication change Please inform patient that she started medication less than 24 hours ago. Please take medication for a few days to actually get it in her system. I will not up the dose prior to that time.   Thanks,   Donia Pounds  APRN, MSN, FNP-C Patient [place] [address] [place], [place] [address] [phone]

## 2019-03-20 ENCOUNTER — Telehealth: Payer: Self-pay

## 2019-03-20 NOTE — Telephone Encounter (Signed)
Called and left a message that all labs are within normal range and that she should eat a heart healthy diet over 5 to 6 small meals daily, increase water intake. Asked that she add 150 minutes of low impact exercise a week. Asked that she keep next follow up appointment. Thanks!

## 2019-03-20 NOTE — Telephone Encounter (Signed)
-----   Message from Dorena Dew, [place] sent at 03/20/2019  6:45 AM EDT ----- Regarding: lab results Please inform patient that all laboratory values are within a normal range. Recommend a heart healthy diet divided over 5-6 small meals throughout the day, also increase water intake. Add 150 minutes of low impact exercises (walking, bicycling, light weights) as discussed. Follow up in 3 months as scheduled.   Donia Pounds  APRN, MSN, FNP-C Patient [place] [address] [place], [place] [address] [phone]

## 2019-03-22 ENCOUNTER — Other Ambulatory Visit: Payer: Self-pay | Admitting: Podiatry

## 2019-03-24 NOTE — Telephone Encounter (Signed)
Can you see how she is doing? Hopefully she is good with just the ibuprofen.

## 2019-03-25 NOTE — Telephone Encounter (Signed)
I called pt she states she gets some relief with the ibuprofen, but has started some exercises and the tramadal really helps and she only uses it after work.

## 2019-03-25 NOTE — Telephone Encounter (Signed)
Can you see how she is doing? Hopefully she is good with just the ibuprofen.  

## 2019-04-08 ENCOUNTER — Other Ambulatory Visit: Payer: Self-pay

## 2019-04-08 ENCOUNTER — Emergency Department (HOSPITAL_COMMUNITY): Payer: BC Managed Care – PPO

## 2019-04-08 ENCOUNTER — Observation Stay (HOSPITAL_COMMUNITY)
Admission: EM | Admit: 2019-04-08 | Discharge: 2019-04-10 | Disposition: A | Discharge status: Home / Self Care | Payer: BC Managed Care – PPO | Attending: Internal Medicine | Admitting: Internal Medicine

## 2019-04-08 ENCOUNTER — Encounter (HOSPITAL_COMMUNITY): Payer: Self-pay | Admitting: Obstetrics and Gynecology

## 2019-04-08 ENCOUNTER — Encounter (HOSPITAL_COMMUNITY): Payer: Self-pay | Admitting: *Deleted

## 2019-04-08 ENCOUNTER — Encounter (HOSPITAL_COMMUNITY): Payer: Self-pay

## 2019-04-08 DIAGNOSIS — J4541 Moderate persistent asthma with (acute) exacerbation: Secondary | ICD-10-CM

## 2019-04-08 DIAGNOSIS — R7303 Prediabetes: Secondary | ICD-10-CM | POA: Insufficient documentation

## 2019-04-08 DIAGNOSIS — Z7951 Long term (current) use of inhaled steroids: Secondary | ICD-10-CM | POA: Insufficient documentation

## 2019-04-08 DIAGNOSIS — K219 Gastro-esophageal reflux disease without esophagitis: Secondary | ICD-10-CM | POA: Diagnosis not present

## 2019-04-08 DIAGNOSIS — I10 Essential (primary) hypertension: Secondary | ICD-10-CM | POA: Diagnosis not present

## 2019-04-08 DIAGNOSIS — Z8249 Family history of ischemic heart disease and other diseases of the circulatory system: Secondary | ICD-10-CM | POA: Diagnosis not present

## 2019-04-08 DIAGNOSIS — M170 Bilateral primary osteoarthritis of knee: Secondary | ICD-10-CM | POA: Insufficient documentation

## 2019-04-08 DIAGNOSIS — Z0184 Encounter for antibody response examination: Secondary | ICD-10-CM | POA: Diagnosis not present

## 2019-04-08 DIAGNOSIS — Z87891 Personal history of nicotine dependence: Secondary | ICD-10-CM | POA: Insufficient documentation

## 2019-04-08 DIAGNOSIS — Z888 Allergy status to other drugs, medicaments and biological substances status: Secondary | ICD-10-CM | POA: Insufficient documentation

## 2019-04-08 DIAGNOSIS — J4551 Severe persistent asthma with (acute) exacerbation: Principal | ICD-10-CM | POA: Insufficient documentation

## 2019-04-08 DIAGNOSIS — D72829 Elevated white blood cell count, unspecified: Secondary | ICD-10-CM | POA: Insufficient documentation

## 2019-04-08 DIAGNOSIS — Z20828 Contact with and (suspected) exposure to other viral communicable diseases: Secondary | ICD-10-CM | POA: Diagnosis not present

## 2019-04-08 DIAGNOSIS — J45901 Unspecified asthma with (acute) exacerbation: Secondary | ICD-10-CM | POA: Diagnosis present

## 2019-04-08 DIAGNOSIS — Z79899 Other long term (current) drug therapy: Secondary | ICD-10-CM | POA: Diagnosis not present

## 2019-04-08 DIAGNOSIS — Z6841 Body Mass Index (BMI) 40.0 and over, adult: Secondary | ICD-10-CM | POA: Insufficient documentation

## 2019-04-08 DIAGNOSIS — Z791 Long term (current) use of non-steroidal anti-inflammatories (NSAID): Secondary | ICD-10-CM | POA: Insufficient documentation

## 2019-04-08 LAB — CBC WITH DIFFERENTIAL/PLATELET
Abs Immature Granulocytes: 0.02 10*3/uL (ref 0.00–0.07)
Basophils Absolute: 0 10*3/uL (ref 0.0–0.1)
Basophils Relative: 1 %
Eosinophils Absolute: 0.2 10*3/uL (ref 0.0–0.5)
Eosinophils Relative: 6 %
HCT: 41.4 % (ref 36.0–46.0)
Hemoglobin: 13.1 g/dL (ref 12.0–15.0)
Immature Granulocytes: 1 %
Lymphocytes Relative: 39 %
Lymphs Abs: 1.5 10*3/uL (ref 0.7–4.0)
MCH: 29.8 pg (ref 26.0–34.0)
MCHC: 31.6 g/dL (ref 30.0–36.0)
MCV: 94.1 fL (ref 80.0–100.0)
Monocytes Absolute: 0.5 10*3/uL (ref 0.1–1.0)
Monocytes Relative: 14 %
Neutro Abs: 1.5 10*3/uL — ABNORMAL LOW (ref 1.7–7.7)
Neutrophils Relative %: 39 %
Platelets: 193 10*3/uL (ref 150–400)
RBC: 4.4 MIL/uL (ref 3.87–5.11)
RDW: 13.2 % (ref 11.5–15.5)
WBC: 3.7 10*3/uL — ABNORMAL LOW (ref 4.0–10.5)
nRBC: 0 % (ref 0.0–0.2)

## 2019-04-08 LAB — BLOOD GAS, VENOUS
Acid-base deficit: 0.1 mmol/L (ref 0.0–2.0)
Bicarbonate: 23.5 mmol/L (ref 20.0–28.0)
O2 Saturation: 98.5 %
Patient temperature: 98.6
pCO2, Ven: 36.8 mmHg — ABNORMAL LOW (ref 44.0–60.0)
pH, Ven: 7.422 (ref 7.250–7.430)
pO2, Ven: 126 mmHg — ABNORMAL HIGH (ref 32.0–45.0)

## 2019-04-08 LAB — BASIC METABOLIC PANEL
Anion gap: 9 (ref 5–15)
BUN: 14 mg/dL (ref 6–20)
CO2: 27 mmol/L (ref 22–32)
Calcium: 9.1 mg/dL (ref 8.9–10.3)
Chloride: 106 mmol/L (ref 98–111)
Creatinine, Ser: 0.7 mg/dL (ref 0.44–1.00)
GFR calc Af Amer: >60 mL/min (ref >60)
GFR calc non Af Amer: >60 mL/min (ref >60)
Glucose, Bld: 98 mg/dL (ref 70–99)
Potassium: 4.3 mmol/L (ref 3.5–5.1)
Sodium: 142 mmol/L (ref 135–145)

## 2019-04-08 LAB — SARS CORONAVIRUS 2 BY RT PCR (HOSPITAL ORDER, PERFORMED IN ~~LOC~~ HOSPITAL LAB): SARS Coronavirus 2: NEGATIVE

## 2019-04-08 MED ORDER — PREDNISONE 20 MG PO TABS
60.0000 mg | ORAL_TABLET | Freq: Once | ORAL | Status: AC
  Administered 2019-04-08: 60 mg via ORAL
  Filled 2019-04-08: qty 3

## 2019-04-08 MED ORDER — LORATADINE 10 MG PO TABS
10.0000 mg | ORAL_TABLET | Freq: Every day | ORAL | Status: DC
  Administered 2019-04-09 – 2019-04-10 (×3): 10 mg via ORAL
  Filled 2019-04-08 (×3): qty 1

## 2019-04-08 MED ORDER — IPRATROPIUM BROMIDE HFA 17 MCG/ACT IN AERS
2.0000 | INHALATION_SPRAY | RESPIRATORY_TRACT | Status: AC
  Administered 2019-04-08 (×3): 2 via RESPIRATORY_TRACT
  Filled 2019-04-08: qty 12.9

## 2019-04-08 MED ORDER — ONDANSETRON HCL 4 MG PO TABS: 4.0000 mg | ORAL_TABLET | Freq: Four times a day (QID) | ORAL | Status: DC | PRN

## 2019-04-08 MED ORDER — IPRATROPIUM-ALBUTEROL 0.5-2.5 (3) MG/3ML IN SOLN
3.0000 mL | Freq: Four times a day (QID) | RESPIRATORY_TRACT | Status: DC
  Administered 2019-04-08 – 2019-04-09 (×4): 3 mL via RESPIRATORY_TRACT
  Filled 2019-04-08 (×4): qty 3

## 2019-04-08 MED ORDER — PREDNISONE 20 MG PO TABS
40.0000 mg | ORAL_TABLET | Freq: Every day | ORAL | Status: DC
  Administered 2019-04-10: 40 mg via ORAL
  Filled 2019-04-08: qty 2

## 2019-04-08 MED ORDER — METHYLPREDNISOLONE SODIUM SUCC 125 MG IJ SOLR
60.0000 mg | Freq: Two times a day (BID) | INTRAMUSCULAR | Status: AC
  Administered 2019-04-09 (×2): 60 mg via INTRAVENOUS
  Filled 2019-04-08 (×2): qty 2

## 2019-04-08 MED ORDER — ONDANSETRON HCL 4 MG/2ML IJ SOLN: 4.0000 mg | Freq: Four times a day (QID) | INTRAMUSCULAR | Status: DC | PRN

## 2019-04-08 MED ORDER — AEROCHAMBER Z-STAT PLUS/MEDIUM MISC
1.0000 | Freq: Once | Status: DC
  Filled 2019-04-08 (×2): qty 1

## 2019-04-08 MED ORDER — SODIUM CHLORIDE 0.9 % IV SOLN
INTRAVENOUS | Status: AC
  Administered 2019-04-09: via INTRAVENOUS

## 2019-04-08 MED ORDER — ACETAMINOPHEN 650 MG RE SUPP: 650.0000 mg | Freq: Four times a day (QID) | RECTAL | Status: DC | PRN

## 2019-04-08 MED ORDER — ALBUTEROL (5 MG/ML) CONTINUOUS INHALATION SOLN
10.0000 mg/h | INHALATION_SOLUTION | Freq: Once | RESPIRATORY_TRACT | Status: AC
  Administered 2019-04-08: 10 mg/h via RESPIRATORY_TRACT
  Filled 2019-04-08: qty 20

## 2019-04-08 MED ORDER — ALBUTEROL SULFATE (2.5 MG/3ML) 0.083% IN NEBU: 2.5000 mg | INHALATION_SOLUTION | RESPIRATORY_TRACT | Status: DC | PRN

## 2019-04-08 MED ORDER — AMLODIPINE BESYLATE 5 MG PO TABS
5.0000 mg | ORAL_TABLET | Freq: Every day | ORAL | Status: DC
  Administered 2019-04-09 – 2019-04-10 (×2): 5 mg via ORAL
  Filled 2019-04-08 (×2): qty 1

## 2019-04-08 MED ORDER — ACETAMINOPHEN 325 MG PO TABS
650.0000 mg | ORAL_TABLET | Freq: Four times a day (QID) | ORAL | Status: DC | PRN
  Administered 2019-04-08: 650 mg via ORAL
  Filled 2019-04-08: qty 2

## 2019-04-08 MED ORDER — ALBUTEROL SULFATE HFA 108 (90 BASE) MCG/ACT IN AERS
8.0000 | INHALATION_SPRAY | RESPIRATORY_TRACT | Status: AC
  Administered 2019-04-08 (×3): 8 via RESPIRATORY_TRACT
  Filled 2019-04-08: qty 6.7

## 2019-04-08 MED ORDER — ALBUTEROL SULFATE HFA 108 (90 BASE) MCG/ACT IN AERS: 8.0000 | INHALATION_SPRAY | Freq: Once | RESPIRATORY_TRACT | Status: DC

## 2019-04-08 MED ORDER — ACETAMINOPHEN 325 MG PO TABS: 650.0000 mg | ORAL_TABLET | Freq: Four times a day (QID) | ORAL | Status: DC | PRN

## 2019-04-08 MED ORDER — MAGNESIUM SULFATE 2 GM/50ML IV SOLN
2.0000 g | Freq: Once | INTRAVENOUS | Status: AC
  Administered 2019-04-08: 2 g via INTRAVENOUS
  Filled 2019-04-08: qty 50

## 2019-04-08 MED ORDER — MOMETASONE FURO-FORMOTEROL FUM 100-5 MCG/ACT IN AERO
2.0000 | INHALATION_SPRAY | Freq: Two times a day (BID) | RESPIRATORY_TRACT | Status: DC
  Administered 2019-04-09 (×2): 2 via RESPIRATORY_TRACT
  Filled 2019-04-08: qty 8.8

## 2019-04-08 MED ORDER — ALBUTEROL (5 MG/ML) CONTINUOUS INHALATION SOLN
10.0000 mg/h | INHALATION_SOLUTION | Freq: Once | RESPIRATORY_TRACT | Status: DC
  Filled 2019-04-08: qty 20

## 2019-04-08 MED ORDER — ENOXAPARIN SODIUM 40 MG/0.4ML ~~LOC~~ SOLN
40.0000 mg | Freq: Every day | SUBCUTANEOUS | Status: DC
  Administered 2019-04-09 (×2): 40 mg via SUBCUTANEOUS
  Filled 2019-04-08 (×2): qty 0.4

## 2019-04-08 MED ORDER — ZOLPIDEM TARTRATE 5 MG PO TABS
5.0000 mg | ORAL_TABLET | Freq: Once | ORAL | Status: AC
  Administered 2019-04-09: 5 mg via ORAL
  Filled 2019-04-08: qty 1

## 2019-04-08 MED ORDER — HYDROCODONE-ACETAMINOPHEN 5-325 MG PO TABS
1.0000 | ORAL_TABLET | ORAL | Status: DC | PRN
  Administered 2019-04-09: 2 via ORAL
  Filled 2019-04-08: qty 2

## 2019-04-08 NOTE — ED Notes (Signed)
Patient has had 2 sandwiches today along with graham crackers and peanut butter.

## 2019-04-08 NOTE — ED Triage Notes (Signed)
Patient reports she has asthma and SOB with exertion. Expiratory wheezing heard in upper lungs. Patient reports she is concerned about pneumonia. Pt reports the changing weather always causes her asthma distress.  Patient denies N/V/D Patient reports chest wall pain with excess coughing

## 2019-04-08 NOTE — Plan of Care (Signed)
POC initiated 

## 2019-04-08 NOTE — ED Provider Notes (Signed)
[facility]-EMERGENCY DEPT Provider Note   CSN: [id number] Arrival date & time: 04/08/19  1054     History   Chief Complaint Chief Complaint  Patient presents with   Shortness of Breath   Asthma    HPI Wendy James is a 48 y.o. female.     Patient with history of asthma presents with complaint of asthma exacerbation.  Patient has been having symptoms for the past 4 days.  She has been using home albuterol inhaler and nebulizer as well as Symbicort.  She denies any fevers.  Cough is productive of clear sputum.  No nausea, vomiting, or diarrhea.  Symptoms are similar to previous asthma exacerbations.  She has been hospitalized in the past for similar symptoms.  No concerning COVID contacts. States 2 previous negative tests.      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   Asthma    Bone spur of foot    Seasonal allergies     Patient Active Problem List   Diagnosis Date Noted   Acute otitis media 12/04/2018   Acute ear pain, left 12/04/2018   Heel spur, left 06/11/2018   Plantar fasciitis, left 03/11/2018   Influenza A with respiratory manifestations 09/06/2016   Prediabetes 05/08/2015   Essential hypertension 05/08/2015   Mild intermittent asthma 05/06/2015   Leukocytosis, steroid induced  04/29/2015   Steroid-induced hyperglycemia 04/29/2015   Accelerated hypertension 04/28/2015   Anemia of chroic diseae, IDA 11/17/2013   Bronchitis, acute 04/12/2012   Acute asthma exacerbation 03/12/2012   Acute respiratory failure (HCC) 03/12/2012    Past Surgical History:  Procedure Laterality Date   CESAREAN SECTION     x 4   CHOLECYSTECTOMY     HEEL SPUR EXCISION     08/2017   HYSTEROSCOPY W/D&C N/A 02/18/2015   Procedure: DILATATION AND CURETTAGE /HYSTEROSCOPY;  Surgeon: Allie BossierMyra C Dove, MD;  Location: [facility];  Service: Gynecology;  Laterality: N/A;   TUBAL LIGATION       OB History    Gravida  5   Para  4   Term  4   Preterm  0   AB  1   Living  3     SAB  1   TAB  0   Ectopic  0   Multiple  0   Live Births               Home Medications    Prior to Admission medications   Medication Sig Start Date End Date Taking? Authorizing Provider  albuterol (PROVENTIL) (5 MG/ML) 0.5% nebulizer solution Take 0.5 mLs (2.5 mg total) by nebulization every 6 (six) hours as needed for wheezing or shortness of breath. 10/29/18   Mike Gipouglas, Andre, FNP  albuterol (VENTOLIN HFA) 108 (90 Base) MCG/ACT inhaler INHALE 1-2 PUFFS INTO THE LUNGS EVERY 6 (SIX) HOURS AS NEEDED FOR WHEEZING OR SHORTNESS OF BREATH. 02/06/19   Mike Gipouglas, Andre, FNP  amLODipine (NORVASC) 5 MG tablet Take 1 tablet (5 mg total) by mouth daily. 03/17/19   Massie MaroonHollis, Lachina M, FNP  cetirizine (ZYRTEC) 10 MG tablet Take 1 tablet (10 mg total) by mouth daily. 03/17/19   Massie MaroonHollis, Lachina M, FNP  gabapentin (NEURONTIN) 100 MG capsule Take 1 capsule (100 mg total) by mouth at bedtime. Patient not taking: Reported on 03/17/2019 08/27/18   Vivi BarrackWagoner, Matthew R, DPM  ibuprofen (ADVIL) 800 MG tablet Take 1 tablet (800 mg total) by mouth  every 8 (eight) hours as needed. 03/09/19   Vivi Barrack, DPM  ipratropium-albuterol (DUONEB) 0.5-2.5 (3) MG/3ML SOLN Take 3 mLs by nebulization every 6 (six) hours as needed. Patient not taking: Reported on 12/03/2018 05/21/18   Mike Gip, FNP  montelukast (SINGULAIR) 10 MG tablet Take 1 tablet (10 mg total) by mouth at bedtime. 10/29/18   Mike Gip, FNP  SYMBICORT 80-4.5 MCG/ACT inhaler INHALE 2 PUFFS INTO THE LUNGS 2 (TWO) TIMES DAILY. 01/13/19   Kallie Locks, FNP  traMADol (ULTRAM) 50 MG tablet TAKE 1 TABLET(50 MG) BY MOUTH TWICE DAILY 03/25/19   Vivi Barrack, DPM  zolpidem (AMBIEN) 5 MG tablet Take 1 tablet (5 mg total) by mouth at bedtime as needed for sleep. 03/17/19   Massie Maroon, FNP    Family History Family History  Problem Relation Age of Onset    Hypertension Mother    Stroke Sister    Seizures Sister     Social History Social History   Tobacco Use   Smoking status: Former Smoker    Packs/day: 0.75    Types: Cigarettes    Quit date: 11/17/1988    Years since quitting: 30.4   Smokeless tobacco: Never Used  Substance Use Topics   Alcohol use: No   Drug use: No     Allergies   Mobic [meloxicam] and Shrimp [shellfish allergy]   Review of Systems Review of Systems  Constitutional: Negative for fever.  HENT: Negative for rhinorrhea and sore throat.   Eyes: Negative for redness.  Respiratory: Positive for cough, shortness of breath and wheezing.   Cardiovascular: Negative for chest pain.  Gastrointestinal: Negative for abdominal pain, diarrhea, nausea and vomiting.  Genitourinary: Negative for dysuria.  Musculoskeletal: Negative for myalgias.  Skin: Negative for rash.  Neurological: Negative for headaches.     Physical Exam Updated Vital Signs BP (!) 146/93 (BP Location: Right Arm)    Pulse 77    Temp 98.9 F (37.2 C) (Oral)    Resp 19    Ht 5\' 2"  (1.575 m)    Wt 99.8 kg    SpO2 98%    BMI 40.24 kg/m   Physical Exam Vitals signs and nursing note reviewed.  Constitutional:      Appearance: She is well-developed.  HENT:     Head: Normocephalic and atraumatic.  Eyes:     General:        Right eye: No discharge.        Left eye: No discharge.     Conjunctiva/sclera: Conjunctivae normal.  Neck:     Musculoskeletal: Normal range of motion and neck supple.  Cardiovascular:     Rate and Rhythm: Normal rate and regular rhythm.     Heart sounds: Normal heart sounds.  Pulmonary:     Effort: Tachypnea and accessory muscle usage present.     Breath sounds: Examination of the right-upper field reveals wheezing. Examination of the left-upper field reveals wheezing. Examination of the right-middle field reveals decreased breath sounds and wheezing. Examination of the left-middle field reveals decreased breath  sounds and wheezing. Examination of the right-lower field reveals decreased breath sounds and wheezing. Examination of the left-lower field reveals decreased breath sounds and wheezing. Decreased breath sounds and wheezing (moderate) present.  Abdominal:     Palpations: Abdomen is soft.     Tenderness: There is no abdominal tenderness.  Skin:    General: Skin is warm and dry.  Neurological:     Mental Status: She  is alert.      ED Treatments / Results  Labs (all labs ordered are listed, but only abnormal results are displayed) Labs Reviewed  CBC WITH DIFFERENTIAL/PLATELET - Abnormal; Notable for the following components:      Result Value   WBC 3.7 (*)    Neutro Abs 1.5 (*)    All other components within normal limits  SARS CORONAVIRUS 2 (HOSPITAL ORDER, [facility])  BASIC METABOLIC PANEL    EKG EKG Interpretation  Date/Time:  Wednesday April 08 2019 11:15:40 EDT Ventricular Rate:  73 PR Interval:    QRS Duration: 76 QT Interval:  374 QTC Calculation: 415 R Axis:   -32 Text Interpretation:  Sinus rhythm Left axis deviation Anterior infarct, old since last tracing no significant change Confirmed by Daleen Bo [phone]) on 04/08/2019 11:41:58 AM   Radiology Dg Chest Portable 1 View  Result Date: 04/08/2019 CLINICAL DATA:  Shortness of breath.  Expiratory wheezing. EXAM: PORTABLE CHEST 1 VIEW COMPARISON:  04/06/2018 FINDINGS: The heart size and mediastinal contours are within normal limits. Both lungs are clear. The visualized skeletal structures are unremarkable. IMPRESSION: Normal exam. Electronically Signed   By: Lorriane Shire M.D.   On: 04/08/2019 12:03    Procedures Procedures (including critical care time)  Medications Ordered in ED Medications  aerochamber Z-Stat Plus/medium 1 each (1 each Other Refused 04/08/19 1207)  albuterol (VENTOLIN HFA) 108 (90 Base) MCG/ACT inhaler 8 puff (8 puffs Inhalation Given 04/08/19 1403)  ipratropium  (ATROVENT HFA) inhaler 2 puff (2 puffs Inhalation Given 04/08/19 1403)  predniSONE (DELTASONE) tablet 60 mg (60 mg Oral Given 04/08/19 1205)  magnesium sulfate IVPB 2 g 50 mL (0 g Intravenous Stopped 04/08/19 1304)  albuterol (PROVENTIL,VENTOLIN) solution continuous neb (10 mg/hr Nebulization Given 04/08/19 1651)     Initial Impression / Assessment and Plan / ED Course  I have reviewed the triage vital signs and the nursing notes.  Pertinent labs & imaging results that were available during my care of the patient were reviewed by me and considered in my medical decision making (see chart for details).        Patient seen and examined. Labs ordered. Will attempt albuterol HFA. Patient could need hospitalization given severity of symptoms.  Will go ahead and obtain labs and coronavirus testing.  Patient with normal oxygen saturation.  Vital signs reviewed and are as follows: BP (!) 146/93 (BP Location: Right Arm)    Pulse 77    Temp 98.9 F (37.2 C) (Oral)    Resp 19    Ht 5\' 2"  (1.575 m)    Wt 99.8 kg    SpO2 98%    BMI 40.24 kg/m   Patient rechecked after 3 albuterol inhaler treatments.  Wheezing is louder with better air movement however patient is still not to her baseline.  COVID testing is negative.  Will place patient on hour-long nebulizer treatment.  Will reassess her after administration.  If she is clinically improved enough to go home, will be discharged with steroid burst.  If not, she will need to be admitted to the hospital for treatment of status asthmaticus.  Handoff to [place] PA-C at shift change.   Final Clinical Impressions(s) / ED Diagnoses   Final diagnoses:  Severe persistent asthma with exacerbation    ED Discharge Orders    None       Carlisle Cater, PA-C 04/08/19 1720    Daleen Bo, MD 04/09/19 1735

## 2019-04-08 NOTE — ED Provider Notes (Signed)
  Physical Exam  BP (!) 147/85   Pulse 81   Temp 98.9 F (37.2 C) (Oral)   Resp (!) 27   Ht 5\' 2"  (1.575 m)   Wt 99.8 kg   SpO2 96%   BMI 40.24 kg/m   Physical Exam  ED Course/Procedures     Procedures  MDM  Patient care assumed from [place]. PA at shift change, please see his note for a full HPI. Briefly, patient with a prior history of asthma presents with shortness of breath x3 days.  No fevers, does have a productive cough with clear sputum.  She has similar admissions due to asthma exacerbation.  She reports her last admission was 2 years ago, she has not been intubated for any asthma exacerbation.  She was covid tested today, covid negative. Patient received mag, steroids, nebulizer treatment for 1 hour.  Plan is to reassess patient prior to disposition.  6:37 PM patient reassessed by me, continues to have wheezing, decrease lung sounds along lower lung fields. She is speaking in short sentences. Oxygen level is maintained. Feel that is not safe for patient to be discharge home due to being tachypnea and without any improvement after treatments in the ED. Will place call for hospital admission.  6:52 PM Spoke to Dr. Roel Cluck hospitalist who will admit patient for further management. Appreciate her help.     Portions of this note were generated with [employer]. Dictation errors may occur despite best attempts at proofreading.          Janeece Fitting, PA-C 04/08/19 Randol Kern    Virgel Manifold, MD 04/12/19 1248

## 2019-04-08 NOTE — H&P (Signed)
Wendy James FAO:[id number] DOB: 08/19/1970 DOA: 04/08/2019     PCP: Mike Gipouglas, Andre, FNP   Outpatient Specialists:     Pulmonary unsure about the name    Patient arrived to ER on 04/08/19 at 1054  Patient coming from: home Lives   With family    Chief Complaint  Chief Complaint  Patient presents with  . Shortness of Breath  . Asthma    HPI: Wendy James is a 48 y.o. female with medical history significant of asthma, HTN, GERD  anemia and seasonal allergy  Presented with worsening shortness of breath and wheezing consistent with her prior asthma exacerbations.  She is remained getting worse the past few days.  No associated nausea vomiting no fevers or chills.  Chest pain consistent with asthma worse with coughing  With known history of asthma and regular admissions around end of summer secondary to exacerbation of asthma due to seasonal allergies.  No prior need for intubation.  On home nebulizers and inhalers.  Last admission for the same was exactly a year ago on 8th September 2019  Reports sinus drainage and pressure   Infectious risk factors:  Reports shortness of breath, dry cough, chest pain,  In  ER RAPID COVID TEST NEGATIVE   Lab Results  Component Value Date   SARSCOV2NAA NEGATIVE 04/08/2019    Regarding pertinent Chronic problems:      HTN on  NOrvasc    Morbid obesity-   BMI Readings from Last 1 Encounters:  04/08/19 40.24 kg/m       Asthma -  controlled on home inhalers/ nebs                         Prior admission  1 y ago                       No  history of intubation       While in ER:  Treatment for asthma exacerbation was initiated including magnesium Solu-Medrol patient tested negative for COVID and was administered albuterol nebulizer with some response but still continues to be somewhat short of breath unable to speak in full sentences and continues to wheeze at which point hospitalist was called for admission  The following Work  up has been ordered so far:  Orders Placed This Encounter  Procedures  . SARS Coronavirus 2 [facility](Hospital order, Performed [facility]) Nasopharyngeal Nasopharyngeal Swab  . DG Chest Portable 1 View  . CBC with Differential/Platelet  . Basic metabolic panel  . Blood [facility] ([facility], not [facility])  . If O2 Sat <94% administer O2 at 2 liters/minute via nasal cannula  . Consult to hospitalist  ALL PATIENTS BEING ADMITTED/HAVING PROCEDURES NEED COVID-19 SCREENING 9854  . Pulse oximetry, continuous  . ED EKG  . EKG 12-Lead   Following Medications were ordered in ER: Medications  aerochamber Z-Stat Plus/medium 1 each (1 each Other Refused 04/08/19 1207)  albuterol (VENTOLIN HFA) 108 (90 Base) MCG/ACT inhaler 8 puff (8 puffs Inhalation Given 04/08/19 1403)  ipratropium (ATROVENT HFA) inhaler 2 puff (2 puffs Inhalation Given 04/08/19 1403)  predniSONE (DELTASONE) tablet 60 mg (60 mg Oral Given 04/08/19 1205)  magnesium sulfate IVPB 2 g 50 mL (0 g Intravenous Stopped 04/08/19 1304)  albuterol (PROVENTIL,VENTOLIN) solution continuous neb (10 mg/hr Nebulization Given 04/08/19 1651)        Consult Orders  (From admission, onward)  Start     Ordered   04/08/19 1835  Consult to hospitalist  ALL PATIENTS BEING ADMITTED/HAVING PROCEDURES NEED COVID-19 SCREENING 9854  Once    Comments: ALL PATIENTS BEING ADMITTED/HAVING PROCEDURES NEED COVID-19 SCREENING 9854  Provider:  (Not yet assigned)  Question Answer Comment  Place call to: [facility]   Reason for Consult Admit      04/08/19 1834          Significant initial  Findings: Abnormal Labs Reviewed  CBC WITH DIFFERENTIAL/PLATELET - Abnormal; Notable for the following components:      Result Value   WBC 3.7 (*)    Neutro Abs 1.5 (*)    All other components within normal limits     Otherwise labs showing:    Recent Labs  Lab 04/08/19 1054  NA 142  K 4.3  CO2 27  GLUCOSE 98  BUN 14  CREATININE 0.70   CALCIUM 9.1    Cr    stable,    Lab Results  Component Value Date   CREATININE 0.70 04/08/2019   CREATININE 0.69 03/17/2019   CREATININE 0.78 05/21/2018    No results for input(s): AST, ALT, ALKPHOS, BILITOT, PROT, ALBUMIN in the last 168 hours. Lab Results  Component Value Date   CALCIUM 9.1 04/08/2019    WBC      Component Value Date/Time   WBC 3.7 (L) 04/08/2019 1054   ANC    Component Value Date/Time   NEUTROABS 1.5 (L) 04/08/2019 1054     Plt: Lab Results  Component Value Date   PLT 193 04/08/2019     Lactic Acid, Venous    Component Value Date/Time   LATICACIDVEN 1.54 09/06/2016 1845   COVID-19 Labs     Lab Results  Component Value Date   SARSCOV2NAA NEGATIVE 04/08/2019    Venous  Blood Gas result:  PH 7.422  pCO2 36.8 ;    ABG    Component Value Date/Time   PHART 7.424 03/12/2012 0607   PCO2ART 36.3 03/12/2012 0607   PO2ART 98.4 03/12/2012 0607   HCO3 24.6 (H) 09/14/2014 0610   TCO2 20 09/14/2014 0658   ACIDBASEDEF 0.2 03/12/2012 0607   O2SAT 90.3 09/14/2014 0610     HG/HCT  Stable,     Component Value Date/Time   HGB 13.1 04/08/2019 1054   HGB 12.2 05/21/2018 1600   HCT 41.4 04/08/2019 1054   HCT 37.4 05/21/2018 1600     ECG: Ordered Personally reviewed by me showing: HR : 73 Rhythm:  NSR,   no evidence of ischemic changes L axis deviation QTC 415   DM  labs:  HbA1C: Recent Labs    03/17/19 1002  HGBA1C 5.6        UA not ordered      Ordered  CXR -  NON acute   ED Triage Vitals  Enc Vitals Group     BP 04/08/19 1115 (!) 146/93     Pulse Rate 04/08/19 1115 77     Resp 04/08/19 1115 19     Temp 04/08/19 1115 98.9 F (37.2 C)     Temp Source 04/08/19 1115 Oral     SpO2 04/08/19 1115 98 %     Weight 04/08/19 1117 220 lb (99.8 kg)     Height 04/08/19 1117 5\' 2"  (1.575 m)     Head Circumference --      Peak Flow --      Pain Score 04/08/19 1116 6  Pain Loc --      Pain Edu? --      Excl. in [place]? --    TMAX(24)@       Latest  Blood pressure (!) 162/74, pulse (!) 102, temperature 98.9 F (37.2 C), temperature source Oral, resp. rate (!) 25, height 5\' 2"  (1.575 m), weight 99.8 kg, SpO2 100 %.    Hospitalist was called for admission for asthma exaserbation   Review of Systems:    Pertinent positives include:   dyspnea on exertion, shortness of breath at  restnon-productive cough,  wheezing. Constitutional:  No weight loss, night sweats, Fevers, chills, fatigue, weight loss  HEENT:  No headaches, Difficulty swallowing,Tooth/dental problems,Sore throat,  No sneezing, itching, ear ache, nasal congestion, post nasal drip,  Cardio-vascular:  No chest pain, Orthopnea, PND, anasarca, dizziness, palpitations.no Bilateral lower extremity swelling  GI:  No heartburn, indigestion, abdominal pain, nausea, vomiting, diarrhea, change in bowel habits, loss of appetite, melena, blood in stool, hematemesis Resp:    No excess mucus, no productive cough, No  No coughing up of blood.No change in color of mucus.  Skin:  no rash or lesions. No jaundice GU:  no dysuria, change in color of urine, no urgency or frequency. No straining to urinate.  No flank pain.  Musculoskeletal:  No joint pain or no joint swelling. No decreased range of motion. No back pain.  Psych:  No change in mood or affect. No depression or anxiety. No memory loss.  Neuro: no localizing neurological complaints, no tingling, no weakness, no double vision, no gait abnormality, no slurred speech, no confusion  All systems reviewed and apart from [place] all are negative  Past Medical History:   Past Medical History:  Diagnosis Date  . 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 Asthma   . Bone spur of foot   . Seasonal allergies      Past Surgical History:  Procedure Laterality Date  . CESAREAN SECTION     x 4  . CHOLECYSTECTOMY    . HEEL SPUR EXCISION     08/2017  . HYSTEROSCOPY W/D&C N/A 02/18/2015   Procedure: DILATATION AND  CURETTAGE /HYSTEROSCOPY;  Surgeon: Emily Filbert, MD;  Location: [facility];  Service: Gynecology;  Laterality: N/A;  . TUBAL LIGATION      Social History:  Ambulatory   independently       reports that she quit smoking about 30 years ago. Her smoking use included cigarettes. She smoked 0.75 packs per day. She has never used smokeless tobacco. She reports that she does not drink alcohol or use drugs.   Family History:   Family History  Problem Relation Age of Onset  . Hypertension Mother   . Stroke Sister   . Seizures Sister     Allergies: Allergies  Allergen Reactions  . Mobic [Meloxicam] Hives    Pt reports she gets real bad hives from Mobic  . Shrimp [Shellfish Allergy] Other (See Comments)    Wheezing.  Patient states she is ok with betadine     Prior to Admission medications   Medication Sig Start Date End Date Taking? Authorizing Provider  albuterol (PROVENTIL) (5 MG/ML) 0.5% nebulizer solution Take 0.5 mLs (2.5 mg total) by nebulization every 6 (six) hours as needed for wheezing or shortness of breath. 10/29/18  Yes Lanae Boast, FNP  albuterol (VENTOLIN HFA) 108 (90 Base) MCG/ACT inhaler INHALE 1-2 PUFFS INTO THE LUNGS EVERY 6 (SIX) HOURS AS NEEDED FOR WHEEZING OR  SHORTNESS OF BREATH. Patient taking differently: Inhale 1-2 puffs into the lungs every 6 (six) hours as needed for wheezing or shortness of breath.  02/06/19  Yes Mike Gip, FNP  amLODipine (NORVASC) 5 MG tablet Take 1 tablet (5 mg total) by mouth daily. 03/17/19  Yes Massie Maroon, FNP  amoxicillin-clavulanate (AUGMENTIN) 875-125 MG tablet Take 1 tablet by mouth 2 (two) times daily. 04/07/19  Yes [provider]  cetirizine (ZYRTEC) 10 MG tablet Take 1 tablet (10 mg total) by mouth daily. 03/17/19  Yes Massie Maroon, FNP  ibuprofen (ADVIL) 800 MG tablet Take 1 tablet (800 mg total) by mouth every 8 (eight) hours as needed. Patient taking differently: Take 800 mg by mouth every 8 (eight) hours as  needed for moderate pain.  03/09/19  Yes Vivi Barrack, DPM  SYMBICORT 80-4.5 MCG/ACT inhaler INHALE 2 PUFFS INTO THE LUNGS 2 (TWO) TIMES DAILY. Patient taking differently: Inhale 2 puffs into the lungs 2 (two) times daily.  01/13/19  Yes Kallie Locks, FNP  zolpidem (AMBIEN) 5 MG tablet Take 1 tablet (5 mg total) by mouth at bedtime as needed for sleep. 03/17/19  Yes Massie Maroon, FNP  gabapentin (NEURONTIN) 100 MG capsule Take 1 capsule (100 mg total) by mouth at bedtime. Patient not taking: Reported on 03/17/2019 08/27/18   Vivi Barrack, DPM  ipratropium-albuterol (DUONEB) 0.5-2.5 (3) MG/3ML SOLN Take 3 mLs by nebulization every 6 (six) hours as needed. Patient not taking: Reported on 04/08/2019 05/21/18   Mike Gip, FNP  montelukast (SINGULAIR) 10 MG tablet Take 1 tablet (10 mg total) by mouth at bedtime. Patient not taking: Reported on 04/08/2019 10/29/18   Mike Gip, FNP  traMADol (ULTRAM) 50 MG tablet TAKE 1 TABLET(50 MG) BY MOUTH TWICE DAILY Patient not taking: Reported on 04/08/2019 03/25/19   Vivi Barrack, DPM   Physical Exam: Blood pressure (!) 162/74, pulse (!) 102, temperature 98.9 F (37.2 C), temperature source Oral, resp. rate (!) 25, height 5\' 2"  (1.575 m), weight 99.8 kg, SpO2 100 %. 1. General:  in No  Acute distress    Chronically ill  -appearing 2. Psychological: Alert and   Oriented 3. Head/ENT:     Dry Mucous Membranes                          Head Non traumatic, neck supple                            Poor Dentition 4. SKIN:   decreased Skin turgor,  Skin clean Dry and intact no rash 5. Heart: Regular rate and rhythm no  Murmur, no Rub or gallop 6. Lungs: some wheezes or crackles   7. Abdomen: Soft,  non-tender, Non distended   obese   bowel sounds present 8. Lower extremities: no clubbing, cyanosis, no  edema 9. Neurologically Grossly intact, moving all 4 extremities equally  10. MSK: Normal range of motion   All other LABS:     Recent  Labs  Lab 04/08/19 1054  WBC 3.7*  NEUTROABS 1.5*  HGB 13.1  HCT 41.4  MCV 94.1  PLT 193     Recent Labs  Lab 04/08/19 1054  NA 142  K 4.3  CL 106  CO2 27  GLUCOSE 98  BUN 14  CREATININE 0.70  CALCIUM 9.1     No results for input(s): AST, ALT, ALKPHOS, BILITOT, PROT,  ALBUMIN in the last 168 hours.     Cultures:    Component Value Date/Time   SDES BLOOD RIGHT HAND 04/06/2018 0426   SPECREQUEST  04/06/2018 0426    BOTTLES DRAWN AEROBIC AND ANAEROBIC Blood Culture results may not be optimal due to an excessive volume of blood received in culture bottles   CULT  04/06/2018 0426    NO GROWTH 5 DAYS Performed at [facility], [address], [place], [place] [address]    REPTSTATUS 04/11/2018 FINAL 04/06/2018 0426     Radiological Exams on Admission: Dg Chest Portable 1 View  Result Date: 04/08/2019 CLINICAL DATA:  Shortness of breath.  Expiratory wheezing. EXAM: PORTABLE CHEST 1 VIEW COMPARISON:  04/06/2018 FINDINGS: The heart size and mediastinal contours are within normal limits. Both lungs are clear. The visualized skeletal structures are unremarkable. IMPRESSION: Normal exam. Electronically Signed   By: Francene Boyers M.D.   On: 04/08/2019 12:03    Chart has been reviewed    Assessment/Plan   48 y.o. female with medical history significant of asthma, HTN, GERD  anemia and seasonal allergy   Admitted for asthma exacerbation  Present on Admission: . Acute asthma exacerbation -    Will initiate: Steroid taper   - Albuterol PRN, - scheduled duoneb,  -  Breo or Dulera at discharge   -  Mucinex.  Titrate O2 to saturation >90%. Follow patients respiratory status.  Order respiratory panel    VBG - unremarkable . Essential hypertension -conitnue norvasc     Other plan as per orders.  DVT prophylaxis:    Lovenox     Code Status:  FULL CODE   as per patient  I had personally discussed CODE STATUS with patient    Family Communication:   Family not  at  Bedside   Disposition Plan:    To home once workup is complete and patient is stable    Consults called: none  Admission status:  ED Disposition    ED Disposition Condition Comment   Admit  The patient appears reasonably stabilized for admission considering the current resources, flow, and capabilities available in the ED at this time, and I doubt any other [facility] requiring further screening and/or treatment in the ED prior to admission is  present.       Obs     Level of care       SDU tele indefinitely please discontinue once patient no longer qualifies  Precautions:   Droplet,   No active isolations  PPE: Used by the provider:   P100  eye Goggles,  Gloves  Metzli Pollick 04/08/2019, 8:37 PM    [facility]     after 2 AM please page floor coverage PA If 7AM-7PM, please contact the day team taking care of the patient using [email or web address]

## 2019-04-08 NOTE — ED Notes (Signed)
Pt given third sandwich, soup and soda today because pt was still stating she was "starving".

## 2019-04-09 ENCOUNTER — Other Ambulatory Visit: Payer: Self-pay | Admitting: Family Medicine

## 2019-04-09 ENCOUNTER — Telehealth: Payer: Self-pay

## 2019-04-09 DIAGNOSIS — J4541 Moderate persistent asthma with (acute) exacerbation: Secondary | ICD-10-CM | POA: Diagnosis not present

## 2019-04-09 DIAGNOSIS — I10 Essential (primary) hypertension: Secondary | ICD-10-CM | POA: Diagnosis not present

## 2019-04-09 DIAGNOSIS — G4709 Other insomnia: Secondary | ICD-10-CM

## 2019-04-09 LAB — RESPIRATORY PANEL BY PCR

## 2019-04-09 LAB — COMPREHENSIVE METABOLIC PANEL
ALT: 31 U/L (ref 0–44)
AST: 25 U/L (ref 15–41)
Albumin: 4 g/dL (ref 3.5–5.0)
Alkaline Phosphatase: 72 U/L (ref 38–126)
Anion gap: 10 (ref 5–15)
BUN: 12 mg/dL (ref 6–20)
CO2: 21 mmol/L — ABNORMAL LOW (ref 22–32)
Calcium: 8.5 mg/dL — ABNORMAL LOW (ref 8.9–10.3)
Chloride: 108 mmol/L (ref 98–111)
Creatinine, Ser: 0.49 mg/dL (ref 0.44–1.00)
GFR calc Af Amer: >60 mL/min (ref >60)
GFR calc non Af Amer: >60 mL/min (ref >60)
Glucose, Bld: 153 mg/dL — ABNORMAL HIGH (ref 70–99)
Potassium: 3.9 mmol/L (ref 3.5–5.1)
Sodium: 139 mmol/L (ref 135–145)
Total Bilirubin: 0.6 mg/dL (ref 0.3–1.2)
Total Protein: 7.6 g/dL (ref 6.5–8.1)

## 2019-04-09 LAB — CBC
HCT: 39.8 % (ref 36.0–46.0)
Hemoglobin: 12.8 g/dL (ref 12.0–15.0)
MCH: 30 pg (ref 26.0–34.0)
MCHC: 32.2 g/dL (ref 30.0–36.0)
MCV: 93.4 fL (ref 80.0–100.0)
Platelets: 208 10*3/uL (ref 150–400)
RBC: 4.26 MIL/uL (ref 3.87–5.11)
RDW: 13.4 % (ref 11.5–15.5)
WBC: 6.8 10*3/uL (ref 4.0–10.5)
nRBC: 0 % (ref 0.0–0.2)

## 2019-04-09 LAB — TSH: TSH: 0.165 u[IU]/mL — ABNORMAL LOW (ref 0.350–4.500)

## 2019-04-09 LAB — MAGNESIUM: Magnesium: 2.1 mg/dL (ref 1.7–2.4)

## 2019-04-09 LAB — HIV ANTIBODY (ROUTINE TESTING W REFLEX): HIV Screen 4th Generation wRfx: NONREACTIVE

## 2019-04-09 LAB — PHOSPHORUS: Phosphorus: 1.6 mg/dL — ABNORMAL LOW (ref 2.5–4.6)

## 2019-04-09 MED ORDER — IPRATROPIUM BROMIDE HFA 17 MCG/ACT IN AERS
2.0000 | INHALATION_SPRAY | RESPIRATORY_TRACT | Status: DC | PRN
  Filled 2019-04-09: qty 12.9

## 2019-04-09 MED ORDER — IBUPROFEN 200 MG PO TABS
400.0000 mg | ORAL_TABLET | Freq: Four times a day (QID) | ORAL | Status: DC | PRN
  Administered 2019-04-09 (×2): 400 mg via ORAL
  Filled 2019-04-09 (×2): qty 2

## 2019-04-09 MED ORDER — ALBUTEROL SULFATE (2.5 MG/3ML) 0.083% IN NEBU: 2.5000 mg | INHALATION_SOLUTION | RESPIRATORY_TRACT | Status: DC | PRN

## 2019-04-09 MED ORDER — ZOLPIDEM TARTRATE 5 MG PO TABS
5.0000 mg | ORAL_TABLET | Freq: Every evening | ORAL | Status: DC | PRN
  Administered 2019-04-09: 5 mg via ORAL
  Filled 2019-04-09: qty 1

## 2019-04-09 MED ORDER — ZOLPIDEM TARTRATE 5 MG PO TABS: 5.0000 mg | ORAL_TABLET | Freq: Every evening | ORAL | 1 refills | Status: DC | PRN

## 2019-04-09 MED ORDER — ADULT MULTIVITAMIN W/MINERALS CH
1.0000 | ORAL_TABLET | Freq: Every day | ORAL | Status: DC
  Administered 2019-04-09 – 2019-04-10 (×2): 1 via ORAL
  Filled 2019-04-09 (×2): qty 1

## 2019-04-09 MED ORDER — HYDRALAZINE HCL 20 MG/ML IJ SOLN: 5.0000 mg | Freq: Four times a day (QID) | INTRAMUSCULAR | Status: DC | PRN

## 2019-04-09 MED ORDER — PRO-STAT SUGAR FREE PO LIQD
30.0000 mL | Freq: Two times a day (BID) | ORAL | Status: DC
  Administered 2019-04-09 (×2): 30 mL via ORAL
  Filled 2019-04-09 (×3): qty 30

## 2019-04-09 MED ORDER — IPRATROPIUM BROMIDE HFA 17 MCG/ACT IN AERS: 2.0000 | INHALATION_SPRAY | RESPIRATORY_TRACT | Status: DC

## 2019-04-09 MED ORDER — IPRATROPIUM-ALBUTEROL 0.5-2.5 (3) MG/3ML IN SOLN: 3.0000 mL | Freq: Four times a day (QID) | RESPIRATORY_TRACT | Status: DC | PRN

## 2019-04-09 MED ORDER — ALBUTEROL SULFATE HFA 108 (90 BASE) MCG/ACT IN AERS
2.0000 | INHALATION_SPRAY | RESPIRATORY_TRACT | Status: DC | PRN
  Filled 2019-04-09: qty 6.7

## 2019-04-09 MED ORDER — BENZONATATE 100 MG PO CAPS
100.0000 mg | ORAL_CAPSULE | Freq: Three times a day (TID) | ORAL | Status: DC | PRN
  Administered 2019-04-09 (×2): 100 mg via ORAL
  Filled 2019-04-09 (×2): qty 1

## 2019-04-09 NOTE — Telephone Encounter (Signed)
[place],  Refill request for [facility]. Please advise. Thanks!

## 2019-04-09 NOTE — Evaluation (Signed)
Occupational Therapy Evaluation Patient Details Name: Wendy MeringLynette Epting MRN: [id number] DOB: 02/25/1971 Today's Date: 04/09/2019    History of Present Illness 48 y.o.femalewith medical history significant of asthma, HTN, GERD. Presented with worsening shortness of breath and wheezing consistent with her prior asthma exacerbations.     Clinical Impression   Pt admitted with the above diagnoses and presents with below problem list. PTA pt independent with ADLs, works full-time. Pt at/close to baseline with ADLs currently. Discussed energy conservation strategies. No further acute OT needs indicated. OT signing off.    Follow Up Recommendations  No OT follow up    Equipment Recommendations  None recommended by OT    Recommendations for Other Services       Precautions / Restrictions        Mobility Bed Mobility Overal bed mobility: Independent                Transfers Overall transfer level: Independent                    Balance Overall balance assessment: No apparent balance deficits (not formally assessed)                                         ADL either performed or assessed with clinical judgement   ADL Overall ADL's : Independent                                       General ADL Comments: Pt walking in the room, toilet transfer, reports she washed up earlier. Some DOE after several minutes of OOB activity but O2 100 on RA. Educated on energy conservation.     Vision         Perception     Praxis      Pertinent Vitals/Pain Pain Assessment: No/denies pain     Hand Dominance     Extremity/Trunk Assessment Upper Extremity Assessment Upper Extremity Assessment: Overall WFL for tasks assessed   Lower Extremity Assessment Lower Extremity Assessment: Overall WFL for tasks assessed   Cervical / Trunk Assessment Cervical / Trunk Assessment: Normal   Communication Communication Communication: No  difficulties   Cognition Arousal/Alertness: Awake/alert Behavior During Therapy: WFL for tasks assessed/performed Overall Cognitive Status: Within Functional Limits for tasks assessed                                     General Comments       Exercises     Shoulder Instructions      Home Living Family/patient expects to be discharged to:: Private residence Living Arrangements: Spouse/significant other     Home Access: Level entry     Home Layout: One level     Bathroom Shower/Tub: Runner, broadcasting/film/videoWalk-in shower         Home Equipment: None          Prior Functioning/Environment Level of Independence: Independent        Comments: builds Industrial/product designergas tanks, works full-time; has modifications to job in place        OT Problem List:        OT Treatment/Interventions:      OT Goals(Current goals can be found in the care plan section) Acute Rehab  OT Goals Patient Stated Goal: home tomorrow, get a good night sleep  OT Frequency:     Barriers to D/C:            Co-evaluation              AM-PAC OT "6 Clicks" Daily Activity     Outcome Measure Help from another person eating meals?: None Help from another person taking care of personal grooming?: None Help from another person toileting, which includes using toliet, bedpan, or urinal?: None Help from another person bathing (including washing, rinsing, drying)?: None Help from another person to put on and taking off regular upper body clothing?: None Help from another person to put on and taking off regular lower body clothing?: None 6 Click Score: 24   End of Session    Activity Tolerance: Patient tolerated treatment well(some DOE after several minutes OOB activity, O2 100 on RA) Patient left: in bed;with call bell/phone within reach  OT Visit Diagnosis: Other (comment)(impaired cardiopulmonary status)                Time: 2774-1287 OT Time Calculation (min): 11 min Charges:  OT General Charges $OT  Visit: 1 Visit OT Evaluation $OT Eval Low Complexity: 1 Low  Tyrone Schimke, OT Acute Rehabilitation Services Pager: [phone] Office: [phone]   Hortencia Pilar 04/09/2019, 11:06 AM

## 2019-04-09 NOTE — Progress Notes (Signed)
PT Cancellation Note / Screen  Patient Details Name: Wendy James MRN: [id number] DOB: 1971/01/23   Cancelled Treatment:    Reason Eval/Treat Not Completed: PT screened, no needs identified, will sign off Per RN, pt independent in room.  OT has seen and reports no skilled PT needs at this time.  PT to sign off.   Saxton Chain,KATHrine E 04/09/2019, 11:14 AM  Carmelia Bake, PT, DPT Acute Rehabilitation Services Office: [phone] Pager: [phone]

## 2019-04-09 NOTE — Progress Notes (Signed)
Meds ordered this encounter  Medications  . zolpidem (AMBIEN) 5 MG tablet    Sig: Take 1 tablet (5 mg total) by mouth at bedtime as needed for sleep.    Dispense:  30 tablet    Refill:  1    Order Specific Question:   Supervising Provider    Answer:   JEGEDE, OLUGBEMIGA E [[id number]]    Wendy Moore Hollis  APRN, MSN, FNP-C [facility] [address]  [place], [place] [address] [phone]  

## 2019-04-09 NOTE — TOC Initial Note (Signed)
Transition of Care [facility]) - Initial/Assessment Note    Patient Details  Name: Wendy James MRN: [id number] Date of Birth: 13-Sep-1970  [facility] CM/SW Contact:    Purcell Mouton, RN Phone Number: 04/09/2019, 12:31 PM  Clinical Narrative:                 Will continue to follow for discharge needs.   Expected Discharge Plan: Home/Self Care     Patient Goals and CMS Choice Patient states their goals for this hospitalization and ongoing recovery are:: To get better and return home.      Expected Discharge Plan and Services Expected Discharge Plan: Home/Self Care   Discharge Planning Services: CM Consult   Living arrangements for the past 2 months: Single Family Home Expected Discharge Date: 04/10/19                                    Prior Living Arrangements/Services Living arrangements for the past 2 months: Single Family Home Lives with:: Spouse Patient language and need for interpreter reviewed:: No Do you feel safe going back to the place where you live?: Yes               Activities of Daily Living Home Assistive Devices/Equipment: Nebulizer ADL Screening (condition at time of admission) Patient's cognitive ability adequate to safely complete daily activities?: Yes Is the patient deaf or have difficulty hearing?: No Does the patient have difficulty seeing, even when wearing glasses/contacts?: No Does the patient have difficulty concentrating, remembering, or making decisions?: No Patient able to express need for assistance with ADLs?: Yes Does the patient have difficulty dressing or bathing?: No Independently performs ADLs?: Yes (appropriate for developmental age) Does the patient have difficulty walking or climbing stairs?: No Weakness of Legs: None Weakness of Arms/Hands: None  Permission Sought/Granted Permission sought to share information with : Case Manager                Emotional Assessment Appearance:: Appears  stated age   Affect (typically observed): Accepting Orientation: : Oriented to Self, Oriented to Place, Oriented to  Time, Oriented to Situation      Admission diagnosis:  Severe persistent asthma with exacerbation [J45.51] Asthma exacerbation [J45.901] Patient Active Problem List   Diagnosis Date Noted  . Asthma exacerbation 04/08/2019  . Acute otitis media 12/04/2018  . Acute ear pain, left 12/04/2018  . Heel spur, left 06/11/2018  . Plantar fasciitis, left 03/11/2018  . Influenza A with respiratory manifestations 09/06/2016  . Prediabetes 05/08/2015  . Essential hypertension 05/08/2015  . Mild intermittent asthma 05/06/2015  . Leukocytosis, steroid induced  04/29/2015  . Steroid-induced hyperglycemia 04/29/2015  . Accelerated hypertension 04/28/2015  . Anemia of chroic diseae, IDA 11/17/2013  . Bronchitis, acute 04/12/2012  . Acute asthma exacerbation 03/12/2012  . Acute respiratory failure ([place]) 03/12/2012   PCP:  Lanae Boast, [place] Pharmacy:   [facility] [place], [place] - [address] [place] [address] Phone: [phone] Fax: [place], [address] [place] [address] Phone: [phone] Fax: [phone]     Social Determinants of Health (SDOH) Interventions    Readmission Risk Interventions No flowsheet data found.

## 2019-04-09 NOTE — Progress Notes (Signed)
Initial Nutrition Assessment  DOCUMENTATION CODES:   Morbid obesity  INTERVENTION:  - will order 30 mL Prostat BID, each supplement provides 100 kcal and 15 grams of protein. - will order daily multivitamin with minerals. - continue to encourage PO intakes.    NUTRITION DIAGNOSIS:   Increased nutrient needs related to acute illness(asthma exacerbation) as evidenced by estimated needs.  GOAL:   Patient will meet greater than or equal to 90% of their needs  MONITOR:   PO intake, Supplement acceptance, Labs, Weight trends  REASON FOR ASSESSMENT:   Consult Assessment of nutrition requirement/status  ASSESSMENT:   48 y.o. female with medical history significant of asthma, HTN, GERD, anemia, and seasonal allergies. She presented to the ED on 9/9 d/t worsening SOB and wheezing which were consistent with her prior asthma exacerbations. Chest pain consistent with asthma worsening and also cough.  Patient reports good appetite and intakes at home PTA and since admission; no recent changes. She denies any difficulties with chewing or swallowing pertaining to SOB associated with asthma/asthma exacerbation. She denies any recent weight changes. Per chart review, current weight is 220 lb and weight on 5/6 was 223 lb. This indicates 3 lb weight loss in 4 months.    Labs reviewed; Ca: 8.5 mg/dl, Phos: 1.6 mg/dl. Medications reviewed; 2 g IV Mg sulfate x1 run 9/10, 60 mg solu-medrol x2 doses 9/10, 40 mg deltasone/day x4 days starting 9/11.     NUTRITION - FOCUSED PHYSICAL EXAM:  completed; no muscle and no fat wasting.   Diet Order:   Diet Order            Diet regular Room service appropriate? Yes; Fluid consistency: Thin  Diet effective now              EDUCATION NEEDS:   No education needs have been identified at this time  Skin:  Skin Assessment: Reviewed RN Assessment  Last BM:  9/10  Height:   Ht Readings from Last 1 Encounters:  04/08/19 5\' 2"  (1.575 m)     Weight:   Wt Readings from Last 1 Encounters:  04/08/19 99.8 kg    Ideal Body Weight:  50 kg  BMI:  Body mass index is 40.24 kg/m.  Estimated Nutritional Needs:   Kcal:  1900-2100 kcal  Protein:  95-105 grams  Fluid:  >/= 1.9 L/day     Jarome Matin, MS, RD, LDN, [facility] Inpatient Clinical Dietitian Pager # [phone] After hours/weekend pager # [phone]

## 2019-04-09 NOTE — Progress Notes (Signed)
Patient ID: Wendy MeringLynette Tusing, female   DOB: 08/20/1970, 48 y.o.   MRN: [id number]                                                                PROGRESS NOTE                                                                                                                                                                                                             Patient Demographics:    Wendy James, is a 48 y.o. female, DOB - 11/12/1970, WUJ:[id number]  Admit date - 04/08/2019   Admitting Physician Therisa DoyneAnastassia Doutova, MD  Outpatient Primary MD for the patient is Mike Gipouglas, Andre, FNP  LOS - 0  Outpatient Specialists:    Chief Complaint  Patient presents with  . Shortness of Breath  . Asthma       Brief Narrative   48 y.o. female with medical history significant of asthma, HTN, GERD  anemia and seasonal allergy  Presented with worsening shortness of breath and wheezing consistent with her prior asthma exacerbations.  She is remained getting worse the past few days.  No associated nausea vomiting no fevers or chills.  Chest pain consistent with asthma worse with coughing  With known history of asthma and regular admissions around end of summer secondary to exacerbation of asthma due to seasonal allergies.  No prior need for intubation.  On home nebulizers and inhalers.  Last admission for the same was exactly a year ago on 8th September 2019  Reports sinus drainage and pressure   Subjective:    Wendy MeringLynette Cater today has better breathing today. Less wheezing.  Afebrile.   slight headache , pt requesting ibuprofen.  No chest pain, No abdominal pain - No Nausea, No new weakness tingling or numbness, No Cough    Assessment  & Plan :    Active Problems:   Acute asthma exacerbation   Essential hypertension   Asthma exacerbation   48 y.o. female with medical history significant of asthma, HTN, GERD  anemia and seasonal allergy   Admitted for asthma exacerbation  Present on  Admission: . Acute asthma exacerbation -     -Solumedrol-> prednisone 40mg  po qday - Albuterol PRN, - scheduled duoneb, -  Mucinex.   . Essential hypertension -conitnue norvasc  Code Status :  FULL CODE  Family Communication  :  w patient,   Disposition Plan  : home tomorrow  Barriers For Discharge :   Consults  :  none  Procedures  : none  DVT Prophylaxis  :  Lovenox - SCD  Lab Results  Component Value Date   PLT 208 04/09/2019    Anti-infectives (From admission, onward)   None        Objective:   Vitals:   04/08/19 1730 04/08/19 1800 04/08/19 2306 04/09/19 0316  BP: (!) 162/74 (!) 151/80    Pulse: (!) 102 93  87  Resp: (!) 25 18  16   Temp:      TempSrc:      SpO2: 100% 96% 96% 98%  Weight:      Height:        Wt Readings from Last 3 Encounters:  04/08/19 99.8 kg  03/17/19 99.8 kg  12/03/18 101.2 kg     Intake/Output Summary (Last 24 hours) at 04/09/2019 0753 Last data filed at 04/09/2019 0600 Gross per 24 hour  Intake 458.05 ml  Output -  Net 458.05 ml     Physical Exam  Awake Alert, Oriented X 3, No new F.N deficits, Normal affect [place].AT,PERRAL Supple Neck,No JVD, No cervical lymphadenopathy appriciated.  Symmetrical Chest wall movement, Good air movement bilaterally, slight exp wheezing, no crackles.  RRR,No Gallops,Rubs or new Murmurs, No Parasternal Heave +ve B.Sounds, Abd Soft, No tenderness, No organomegaly appriciated, No rebound - guarding or rigidity. No Cyanosis, Clubbing or edema, No new Rash or bruise      Data Review:    CBC Recent Labs  Lab 04/08/19 1054 04/09/19 0415  WBC 3.7* 6.8  HGB 13.1 12.8  HCT 41.4 39.8  PLT 193 208  MCV 94.1 93.4  MCH 29.8 30.0  MCHC 31.6 32.2  RDW 13.2 13.4  LYMPHSABS 1.5  --   MONOABS 0.5  --   EOSABS 0.2  --   BASOSABS 0.0  --     Chemistries  Recent Labs  Lab 04/08/19 1054 04/09/19 0415  NA 142 139  K 4.3 3.9  CL 106 108  CO2 27 21*  GLUCOSE 98 153*  BUN 14 12   CREATININE 0.70 0.49  CALCIUM 9.1 8.5*  MG  --  2.1  AST  --  25  ALT  --  31  ALKPHOS  --  72  BILITOT  --  0.6   ------------------------------------------------------------------------------------------------------------------ No results for input(s): CHOL, HDL, LDLCALC, TRIG, CHOLHDL, LDLDIRECT in the last 72 hours.  Lab Results  Component Value Date   HGBA1C 5.6 03/17/2019   ------------------------------------------------------------------------------------------------------------------ Recent Labs    04/09/19 0415  TSH 0.165*   ------------------------------------------------------------------------------------------------------------------ No results for input(s): VITAMINB12, FOLATE, FERRITIN, TIBC, IRON, RETICCTPCT in the last 72 hours.  Coagulation profile No results for input(s): INR, PROTIME in the last 168 hours.  No results for input(s): DDIMER in the last 72 hours.  Cardiac Enzymes No results for input(s): CKMB, TROPONINI, MYOGLOBIN in the last 168 hours.  Invalid input(s): CK ------------------------------------------------------------------------------------------------------------------ No results found for: BNP  Inpatient Medications  Scheduled Meds: . aerochamber Z-Stat Plus/medium  1 each Other Once  . albuterol  10 mg/hr Nebulization Once  . amLODipine  5 mg Oral Daily  . enoxaparin (LOVENOX) injection  40 mg Subcutaneous QHS  . ipratropium-albuterol  3 mL Nebulization Q6H  . loratadine  10 mg Oral Daily  . methylPREDNISolone (SOLU-MEDROL) injection  60 mg Intravenous Q12H  Followed by  . [START ON 04/10/2019] predniSONE  40 mg Oral Q breakfast  . mometasone-formoterol  2 puff Inhalation BID   Continuous Infusions: . sodium chloride 75 mL/hr at 04/09/19 0025   PRN Meds:.acetaminophen **OR** acetaminophen, acetaminophen, albuterol, HYDROcodone-acetaminophen, ibuprofen, ondansetron **OR** ondansetron (ZOFRAN) IV, zolpidem  Micro Results  Recent Results (from the past 240 hour(s))  SARS Coronavirus 2 [facility](Hospital order, Performed in [facility] hospital lab) Nasopharyngeal Nasopharyngeal Swab     Status: None   Collection Time: 04/08/19  2:47 PM   Specimen: Nasopharyngeal Swab  Result Value Ref Range Status   SARS Coronavirus 2 NEGATIVE NEGATIVE Final    Comment: (NOTE) If result is NEGATIVE SARS-CoV-2 target nucleic acids are NOT DETECTED. The SARS-CoV-2 RNA is generally detectable in upper and lower  respiratory specimens during the acute phase of infection. The lowest  concentration of SARS-CoV-2 viral copies this assay can detect is 250  copies / mL. A negative result does not preclude SARS-CoV-2 infection  and should not be used as the sole basis for treatment or other  patient management decisions.  A negative result may occur with  improper specimen collection / handling, submission of specimen other  than nasopharyngeal swab, presence of viral mutation(s) within the  areas targeted by this assay, and inadequate number of viral copies  (<250 copies / mL). A negative result must be combined with clinical  observations, patient history, and epidemiological information. If result is POSITIVE SARS-CoV-2 target nucleic acids are DETECTED. The SARS-CoV-2 RNA is generally detectable in upper and lower  respiratory specimens dur ing the acute phase of infection.  Positive  results are indicative of active infection with SARS-CoV-2.  Clinical  correlation with patient history and other diagnostic information is  necessary to determine patient infection status.  Positive results do  not rule out bacterial infection or co-infection with other viruses. If result is PRESUMPTIVE POSTIVE SARS-CoV-2 nucleic acids MAY BE PRESENT.   A presumptive positive result was obtained on the submitted specimen  and confirmed on repeat testing.  While 2019 novel coronavirus  (SARS-CoV-2) nucleic acids may be present in the submitted sample   additional confirmatory testing may be necessary for epidemiological  and / or clinical management purposes  to differentiate between  SARS-CoV-2 and other Sarbecovirus currently known to infect humans.  If clinically indicated additional testing with an alternate test  methodology [phone] is advised. The SARS-CoV-2 RNA is generally  detectable in upper and lower respiratory sp ecimens during the acute  phase of infection. The expected result is Negative. Fact Sheet for Patients:  BoilerBrush.com.cyhttps://www.fda.gov/media/136312/download Fact Sheet for Healthcare Providers: https://pope.com/https://www.fda.gov/media/136313/download This test is not yet approved or cleared by the [place] FDA and has been authorized for detection and/or diagnosis of SARS-CoV-2 by FDA under an Emergency Use Authorization (EUA).  This EUA will remain in effect (meaning this test can be used) for the duration of the COVID-19 declaration under Section 564(b)(1) of the Act, 21 U.S.C. section 360bbb-3(b)(1), unless the authorization is terminated or revoked sooner. Performed at [facility], [address] [place], [place] [address]   Respiratory Panel by PCR     Status: Abnormal   Collection Time: 04/08/19  7:51 PM   Specimen: Nasopharyngeal Swab; Respiratory  Result Value Ref Range Status   Adenovirus NOT DETECTED NOT DETECTED Corrected   Coronavirus 229E NOT DETECTED NOT DETECTED Corrected    Comment: (NOTE) The Coronavirus on the Respiratory Panel, DOES NOT test for the novel  Coronavirus (2019 nCoV) CORRECTED ON 09/10 AT 0707: PREVIOUSLY REPORTED AS NOT DETECTED    Coronavirus HKU1 NOT DETECTED NOT DETECTED Corrected   Coronavirus NL63 NOT DETECTED NOT DETECTED Corrected   Coronavirus OC43 NOT DETECTED NOT DETECTED Corrected   Metapneumovirus NOT DETECTED NOT DETECTED Corrected   Rhinovirus / Enterovirus DETECTED (A) NOT DETECTED Corrected   Influenza A NOT DETECTED NOT DETECTED Corrected   Influenza  B NOT DETECTED NOT DETECTED Corrected   Parainfluenza Virus 1 NOT DETECTED NOT DETECTED Corrected   Parainfluenza Virus 2 NOT DETECTED NOT DETECTED Corrected   Parainfluenza Virus 3 NOT DETECTED NOT DETECTED Corrected   Parainfluenza Virus 4 NOT DETECTED NOT DETECTED Corrected   Respiratory Syncytial Virus NOT DETECTED NOT DETECTED Corrected   Bordetella pertussis NOT DETECTED NOT DETECTED Corrected   Chlamydophila pneumoniae NOT DETECTED NOT DETECTED Corrected   Mycoplasma pneumoniae NOT DETECTED NOT DETECTED Corrected    Comment: Performed at [facility], [address], [place], [place] [address]    Radiology Reports Dg Chest Portable 1 View  Result Date: 04/08/2019 CLINICAL DATA:  Shortness of breath.  Expiratory wheezing. EXAM: PORTABLE CHEST 1 VIEW COMPARISON:  04/06/2018 FINDINGS: The heart size and mediastinal contours are within normal limits. Both lungs are clear. The visualized skeletal structures are unremarkable. IMPRESSION: Normal exam. Electronically Signed   By: Francene Boyers M.D.   On: 04/08/2019 12:03    Time Spent in minutes  30   Pearson Grippe M.D on 04/09/2019 at 7:53 AM  Between 7am to 7pm - Pager - [phone]    After 7pm go to [email or web address] - password [facility]  [facility] -  Office  [phone]

## 2019-04-10 DIAGNOSIS — J4541 Moderate persistent asthma with (acute) exacerbation: Secondary | ICD-10-CM | POA: Diagnosis not present

## 2019-04-10 DIAGNOSIS — I10 Essential (primary) hypertension: Secondary | ICD-10-CM | POA: Diagnosis not present

## 2019-04-10 LAB — COMPREHENSIVE METABOLIC PANEL
ALT: 26 U/L (ref 0–44)
AST: 18 U/L (ref 15–41)
Albumin: 3.5 g/dL (ref 3.5–5.0)
Alkaline Phosphatase: 67 U/L (ref 38–126)
Anion gap: 9 (ref 5–15)
BUN: 15 mg/dL (ref 6–20)
CO2: 26 mmol/L (ref 22–32)
Calcium: 8.7 mg/dL — ABNORMAL LOW (ref 8.9–10.3)
Chloride: 106 mmol/L (ref 98–111)
Creatinine, Ser: 0.63 mg/dL (ref 0.44–1.00)
GFR calc Af Amer: >60 mL/min (ref >60)
GFR calc non Af Amer: >60 mL/min (ref >60)
Glucose, Bld: 146 mg/dL — ABNORMAL HIGH (ref 70–99)
Potassium: 4 mmol/L (ref 3.5–5.1)
Sodium: 141 mmol/L (ref 135–145)
Total Bilirubin: 0.6 mg/dL (ref 0.3–1.2)
Total Protein: 6.6 g/dL (ref 6.5–8.1)

## 2019-04-10 LAB — CBC
HCT: 37.6 % (ref 36.0–46.0)
Hemoglobin: 11.8 g/dL — ABNORMAL LOW (ref 12.0–15.0)
MCH: 30.1 pg (ref 26.0–34.0)
MCHC: 31.4 g/dL (ref 30.0–36.0)
MCV: 95.9 fL (ref 80.0–100.0)
Platelets: 174 10*3/uL (ref 150–400)
RBC: 3.92 MIL/uL (ref 3.87–5.11)
RDW: 13.7 % (ref 11.5–15.5)
WBC: 14.2 10*3/uL — ABNORMAL HIGH (ref 4.0–10.5)
nRBC: 0 % (ref 0.0–0.2)

## 2019-04-10 MED ORDER — PREDNISONE 10 MG PO TABS: ORAL_TABLET | ORAL | 0 refills | Status: DC

## 2019-04-10 MED ORDER — PRO-STAT SUGAR FREE PO LIQD: 30.0000 mL | Freq: Two times a day (BID) | ORAL | 0 refills | Status: DC

## 2019-04-10 MED ORDER — AEROCHAMBER Z-STAT PLUS/MEDIUM MISC: 2 refills | Status: DC

## 2019-04-10 MED ORDER — BENZONATATE 100 MG PO CAPS: 100.0000 mg | ORAL_CAPSULE | Freq: Three times a day (TID) | ORAL | 0 refills | Status: DC | PRN

## 2019-04-10 NOTE — Discharge Summary (Signed)
Patient ID: Wendy James MRN: [id number] DOB/AGE: [birth year] 48 y.o.  Admit date: 04/08/2019 Discharge date: 04/10/2019  Primary Care Physician:  Lanae Boast, FNP  Discharge Diagnoses:   Present on Admission: . Acute asthma exacerbation . Essential hypertension . Asthma exacerbation   Consults:  None    Acute asthma exacerbation  Prednisone taper 60mg  po qday x 2 days then 50mg  po qday x 2 days then 40mg  po qday x 2 days then 30mg  po qday x2 days then 20mg  po qday x 2 days then 10mg  po qday x 2 days Cont Symbicort 2puff bid Cont Duoneb 1 neb q6h prn  Cont Albuterol HFA 1-2 puff q6h prn  Please resume singulair 10mg  po qday Cont antihistamine OTC  Tessalon Pearls 100mg  po tid prn cough  Leukocytosis likely secondary to steroid use Check cbc in 2 weeks.   Essential hypertension -conitnue norvasc   Discharge Medications: Allergies as of 04/10/2019      Reactions   Mobic [meloxicam] Hives   Pt reports she gets real bad hives from Mobic   Shrimp [shellfish Allergy] Other (See Comments)   Wheezing.  Patient states she is ok with betadine      Medication List    STOP taking these medications   amoxicillin-clavulanate 875-125 MG tablet Commonly known as: AUGMENTIN   gabapentin 100 MG capsule Commonly known as: NEURONTIN   traMADol 50 MG tablet Commonly known as: ULTRAM     TAKE these medications   aerochamber Z-Stat Plus/medium inhaler Use as instructed   albuterol 108 (90 Base) MCG/ACT inhaler Commonly known as: VENTOLIN HFA INHALE 1-2 PUFFS INTO THE LUNGS EVERY 6 (SIX) HOURS AS NEEDED FOR WHEEZING OR SHORTNESS OF BREATH. What changed:   See the new instructions.  Another medication with the same name was removed. Continue taking this medication, and follow the directions you see here.   amLODipine 5 MG tablet Commonly known as: NORVASC Take 1 tablet (5 mg total) by mouth daily.   benzonatate 100 MG capsule Commonly known as: TESSALON Take 1  capsule (100 mg total) by mouth 3 (three) times daily as needed for cough.   cetirizine 10 MG tablet Commonly known as: ZYRTEC Take 1 tablet (10 mg total) by mouth daily.   feeding supplement (PRO-STAT SUGAR FREE 64) Liqd Take 30 mLs by mouth 2 (two) times daily.   ibuprofen 800 MG tablet Commonly known as: ADVIL Take 1 tablet (800 mg total) by mouth every 8 (eight) hours as needed. What changed: reasons to take this   ipratropium-albuterol 0.5-2.5 (3) MG/3ML Soln Commonly known as: DUONEB Take 3 mLs by nebulization every 6 (six) hours as needed.   montelukast 10 MG tablet Commonly known as: SINGULAIR Take 1 tablet (10 mg total) by mouth at bedtime.   predniSONE 10 MG tablet Commonly known as: DELTASONE 60mg  po qday x 2 days then 50mg  po qday x 2 days then 40mg  po qday x 2 days then 30mg  po qday x 2 days then 20mg  po qday x 2 days then 10mg  po qday x 2 days.   Symbicort 80-4.5 MCG/ACT inhaler Generic drug: budesonide-formoterol INHALE 2 PUFFS INTO THE LUNGS 2 (TWO) TIMES DAILY. What changed:   how much to take  how to take this  when to take this  additional instructions   zolpidem 5 MG tablet Commonly known as: AMBIEN Take 1 tablet (5 mg total) by mouth at bedtime as needed for sleep.        Brief H and P:  48 y.o.femalewith medical history significant of asthma, HTN, GERD anemia and seasonal allergy  Presented withworsening shortness of breath and wheezing consistent with her prior asthma exacerbations. She is remained getting worse the past few days. No associated nausea vomiting no fevers or chills. Chest pain consistent with asthma worse with coughing  With known history of asthma and regular admissions around end of summer secondary to exacerbation of asthma due to seasonal allergies. No prior need for intubation. On home nebulizers and inhalers. Last admission for the same was exactly a year ago on 8thSeptember 2019  Reports sinus drainage and  pressure  Hospital Course:   Pt was started on solumedrol 60mg  iv bid, and then transitioned over to prednisone orally.  Pt continued on symbicort [facility]) 2puff bid, and albuterol/ atrovent nebulizer treatments prn .  Pt already felt much better yesterday , and feels almost to her baseline today. Pt has slight leukocytosis today but is on steroids.  Pt has been afebrile.  No productive cough.  Pt wishes to be discharged home today.     Active Problems:   Acute asthma exacerbation   Essential hypertension   Asthma exacerbation   Day of Discharge BP (!) 177/111 (BP Location: Right Arm)   Pulse 98   Temp 98.2 F (36.8 C)   Resp (!) 24   Ht 5\' 2"  (1.575 m)   Wt 99.8 kg   SpO2 98%   BMI 40.24 kg/m   Physical Exam: General: Alert and awake oriented x3 not in any acute distress. HEENT: anicteric sclera, pupils reactive to light and accommodation CVS: S1-S2 clear no murmur rubs or gallops Chest: faint wheezing , no rales or rhonchi Abdomen: soft nontender, nondistended, normal bowel sounds, no organomegaly Extremities: no cyanosis, clubbing or edema noted bilaterally Neuro: Cranial nerves II-XII intact, no focal neurological deficits   The results of significant diagnostics from this hospitalization (including imaging, microbiology, ancillary and laboratory) are listed below for reference.    LAB RESULTS: Basic Metabolic Panel: Recent Labs  Lab 04/09/19 0415 04/10/19 0404  NA 139 141  K 3.9 4.0  CL 108 106  CO2 21* 26  GLUCOSE 153* 146*  BUN 12 15  CREATININE 0.49 0.63  CALCIUM 8.5* 8.7*  MG 2.1  --   PHOS 1.6*  --    Liver Function Tests: Recent Labs  Lab 04/09/19 0415 04/10/19 0404  AST 25 18  ALT 31 26  ALKPHOS 72 67  BILITOT 0.6 0.6  PROT 7.6 6.6  ALBUMIN 4.0 3.5   No results for input(s): LIPASE, AMYLASE in the last 168 hours. No results for input(s): AMMONIA in the last 168 hours. CBC: Recent Labs  Lab 04/08/19 1054 04/09/19 0415 04/10/19 0404   WBC 3.7* 6.8 14.2*  NEUTROABS 1.5*  --   --   HGB 13.1 12.8 11.8*  HCT 41.4 39.8 37.6  MCV 94.1 93.4 95.9  PLT 193 208 174   Cardiac Enzymes: No results for input(s): CKTOTAL, CKMB, CKMBINDEX, TROPONINI in the last 168 hours. BNP: Invalid input(s): POCBNP CBG: No results for input(s): GLUCAP in the last 168 hours.  Significant Diagnostic Studies:  Dg Chest Portable 1 View  Result Date: 04/08/2019 CLINICAL DATA:  Shortness of breath.  Expiratory wheezing. EXAM: PORTABLE CHEST 1 VIEW COMPARISON:  04/06/2018 FINDINGS: The heart size and mediastinal contours are within normal limits. Both lungs are clear. The visualized skeletal structures are unremarkable. IMPRESSION: Normal exam. Electronically Signed   By: Francene Boyers M.D.   On:  04/08/2019 12:03     Disposition and Follow-up: please f/u with pcp in 2 weeks for asthma   DISPOSITION:   home  DIET: regular  ACTIVITY: as tolerated  TESTS THAT NEED FOLLOW-UP Check cbc in 2 weeks.   DISCHARGE FOLLOW-UP Follow-up Information    Mike GipDouglas, Andre, FNP Follow up in 2 week(s).   Specialty: Family Medicine Contact information: [address] [place] [address] [phone]           Time spent on Discharge: 35 minutes  Signed:   Pearson GrippeJames Athene Schuhmacher M.D. [facility] 04/10/2019, 5:52 AM Pager: [phone]    If 7PM-7AM, please contact night-coverage [email or web address] Password [email or web address]

## 2019-04-20 ENCOUNTER — Encounter: Payer: Self-pay | Admitting: Podiatry

## 2019-04-20 ENCOUNTER — Other Ambulatory Visit: Payer: Self-pay

## 2019-04-20 ENCOUNTER — Ambulatory Visit (INDEPENDENT_AMBULATORY_CARE_PROVIDER_SITE_OTHER): Payer: BC Managed Care – PPO | Admitting: Podiatry

## 2019-04-20 DIAGNOSIS — M722 Plantar fascial fibromatosis: Secondary | ICD-10-CM

## 2019-04-20 MED ORDER — IBUPROFEN 800 MG PO TABS: 800.0000 mg | ORAL_TABLET | Freq: Three times a day (TID) | ORAL | 0 refills | Status: DC | PRN

## 2019-04-21 NOTE — Progress Notes (Signed)
Subjective: 48 year old female presents the office today for follow-up evaluation left heel pain, plantar fasciitis.  Overall she states she is doing better she not had any significant discomfort.  She takes ibuprofen as needed after she is been working 12 hours on her feet all day she gets an occasional discomfort but otherwise she is been doing well. Denies any systemic complaints such as fevers, chills, nausea, vomiting. No acute changes since last appointment, and no other complaints at this time.   Objective: AAO x3, NAD DP/PT pulses palpable bilaterally, CRT less than 3 seconds There is currently no tenderness palpation on the plantar medial tubercle of the calcaneus insertion upon the fascia.  The surgical site is well-healed.  No pain with lateral compression of calcaneus.  No edema, erythema.  Negative Tinel sign. No open lesions or pre-ulcerative lesions.  No pain with calf compression, swelling, warmth, erythema  Assessment: Resolving left foot plantar fasciitis-currently without symptoms  Plan: -All treatment options discussed with the patient including all alternatives, risks, complications.  -I want her to continue with wearing supportive shoes.  We discussed resting, icing daily still.  Continue arch supports inside shoes.  She can take ibuprofen as needed.  I will see her back as needed.  She is in agreement.  Return if symptoms worsen or fail to improve.  Trula Slade DPM

## 2019-05-07 ENCOUNTER — Telehealth: Payer: Self-pay | Admitting: *Deleted

## 2019-05-07 ENCOUNTER — Other Ambulatory Visit: Payer: Self-pay | Admitting: *Deleted

## 2019-05-07 MED ORDER — IBUPROFEN 800 MG PO TABS: 800.0000 mg | ORAL_TABLET | Freq: Three times a day (TID) | ORAL | 0 refills | Status: DC | PRN

## 2019-05-07 NOTE — Telephone Encounter (Signed)
Per Dr Jacqualyn Posey, was ok to refill the ibuprofen 800 mg, 30 tablets with no refills. Wendy James

## 2019-05-07 NOTE — Addendum Note (Signed)
Addended by: Cranford Mon R on: 05/07/2019 10:34 AM   Modules accepted: Orders

## 2019-05-20 ENCOUNTER — Other Ambulatory Visit: Payer: Self-pay | Admitting: Podiatry

## 2019-05-22 ENCOUNTER — Telehealth: Payer: Self-pay | Admitting: *Deleted

## 2019-05-22 NOTE — Telephone Encounter (Signed)
Called patient and left a message stating that we got a refill from the pharmacy for ibuprofen and just wanted to see if patient wanted a refill and to call the [place] office at [phone]. Lattie Haw

## 2019-06-23 ENCOUNTER — Ambulatory Visit: Payer: BLUE CROSS/BLUE SHIELD | Admitting: Family Medicine

## 2019-06-30 ENCOUNTER — Other Ambulatory Visit: Payer: Self-pay | Admitting: Family Medicine

## 2019-06-30 DIAGNOSIS — J4520 Mild intermittent asthma, uncomplicated: Secondary | ICD-10-CM

## 2019-07-07 ENCOUNTER — Ambulatory Visit: Payer: BC Managed Care – PPO | Admitting: Family Medicine

## 2019-07-13 ENCOUNTER — Other Ambulatory Visit: Payer: Self-pay | Admitting: Family Medicine

## 2019-07-13 ENCOUNTER — Telehealth: Payer: Self-pay

## 2019-07-13 DIAGNOSIS — G4709 Other insomnia: Secondary | ICD-10-CM

## 2019-07-13 MED ORDER — ZOLPIDEM TARTRATE 5 MG PO TABS: 5.0000 mg | ORAL_TABLET | Freq: Every evening | ORAL | 1 refills | Status: DC | PRN

## 2019-07-13 NOTE — Progress Notes (Signed)
Meds ordered this encounter  Medications  . zolpidem (AMBIEN) 5 MG tablet    Sig: Take 1 tablet (5 mg total) by mouth at bedtime as needed for sleep.    Dispense:  30 tablet    Refill:  1    Order Specific Question:   Supervising Provider    Answer:   JEGEDE, OLUGBEMIGA E [[id number]]    Verena Shawgo Moore Jenene Kauffmann  APRN, MSN, FNP-C [facility] [address]  [place], [place] [address] [phone]  

## 2019-07-13 NOTE — Telephone Encounter (Signed)
Refill request for zolpidem. Send to [facility] [address] [facility]. Please advise.

## 2019-08-07 ENCOUNTER — Ambulatory Visit: Payer: BC Managed Care – PPO | Admitting: Podiatry

## 2019-08-08 ENCOUNTER — Other Ambulatory Visit: Payer: Self-pay

## 2019-08-08 ENCOUNTER — Emergency Department (HOSPITAL_COMMUNITY)
Admission: EM | Admit: 2019-08-08 | Discharge: 2019-08-08 | Disposition: A | Discharge status: Home / Self Care | Payer: BC Managed Care – PPO | Attending: Emergency Medicine | Admitting: Emergency Medicine

## 2019-08-08 ENCOUNTER — Encounter (HOSPITAL_COMMUNITY): Payer: Self-pay | Admitting: Emergency Medicine

## 2019-08-08 DIAGNOSIS — Z5321 Procedure and treatment not carried out due to patient leaving prior to being seen by health care provider: Secondary | ICD-10-CM | POA: Diagnosis not present

## 2019-08-08 DIAGNOSIS — M25512 Pain in left shoulder: Secondary | ICD-10-CM | POA: Diagnosis not present

## 2019-08-08 NOTE — ED Triage Notes (Signed)
Pt c/o left shoulder and left arm pains since Wed. Denies injuries.

## 2019-08-09 ENCOUNTER — Encounter (HOSPITAL_COMMUNITY): Payer: Self-pay | Admitting: Emergency Medicine

## 2019-08-09 ENCOUNTER — Other Ambulatory Visit: Payer: Self-pay

## 2019-08-09 ENCOUNTER — Emergency Department (HOSPITAL_COMMUNITY)
Admission: EM | Admit: 2019-08-09 | Discharge: 2019-08-09 | Disposition: A | Discharge status: Home / Self Care | Payer: BC Managed Care – PPO | Attending: Emergency Medicine | Admitting: Emergency Medicine

## 2019-08-09 DIAGNOSIS — S161XXA Strain of muscle, fascia and tendon at neck level, initial encounter: Secondary | ICD-10-CM | POA: Insufficient documentation

## 2019-08-09 DIAGNOSIS — M5412 Radiculopathy, cervical region: Secondary | ICD-10-CM

## 2019-08-09 DIAGNOSIS — Z87891 Personal history of nicotine dependence: Secondary | ICD-10-CM | POA: Insufficient documentation

## 2019-08-09 DIAGNOSIS — Z79899 Other long term (current) drug therapy: Secondary | ICD-10-CM | POA: Insufficient documentation

## 2019-08-09 DIAGNOSIS — Y929 Unspecified place or not applicable: Secondary | ICD-10-CM | POA: Diagnosis not present

## 2019-08-09 DIAGNOSIS — Y999 Unspecified external cause status: Secondary | ICD-10-CM | POA: Diagnosis not present

## 2019-08-09 DIAGNOSIS — X501XXA Overexertion from prolonged static or awkward postures, initial encounter: Secondary | ICD-10-CM | POA: Diagnosis not present

## 2019-08-09 DIAGNOSIS — I10 Essential (primary) hypertension: Secondary | ICD-10-CM | POA: Diagnosis not present

## 2019-08-09 DIAGNOSIS — J45909 Unspecified asthma, uncomplicated: Secondary | ICD-10-CM | POA: Insufficient documentation

## 2019-08-09 DIAGNOSIS — Y939 Activity, unspecified: Secondary | ICD-10-CM | POA: Diagnosis not present

## 2019-08-09 DIAGNOSIS — S199XXA Unspecified injury of neck, initial encounter: Secondary | ICD-10-CM | POA: Diagnosis present

## 2019-08-09 MED ORDER — CYCLOBENZAPRINE HCL 10 MG PO TABS: 10.0000 mg | ORAL_TABLET | Freq: Every day | ORAL | 0 refills | Status: DC

## 2019-08-09 MED ORDER — NAPROXEN 500 MG PO TABS: 500.0000 mg | ORAL_TABLET | Freq: Two times a day (BID) | ORAL | 0 refills | Status: DC

## 2019-08-09 NOTE — ED Provider Notes (Addendum)
[facility] EMERGENCY DEPARTMENT Provider Note   CSN: [id number] Arrival date & time: 08/09/19  1017     History Chief Complaint  Patient presents with  . Neck Pain  . Arm Pain    Wendy James is a 49 y.o. female with PMH significant for arthritis symptoms to the ED with left-sided neck discomfort that will often cause a sharp, tingling radiation down left arm into the fingers.  She reports this has been going on for approximately 1 month, but she has been noticing it more recently prompting her to come into the ER for evaluation.  She believes that it is related to how she is sleeping at night and has recently purchased a new bed and pillows, with no significant relief.  She suspects that it is musculoskeletal and has been doing trapezius shrugs in an effort to remedy her discomfort.  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  She reports that she feels uncomfortable driving because it is hard for her to look to the right without eliciting discomfort.  She denies any fevers or chills, history of IVDA or malignancy, extremity numbness or weakness, trauma, recent illness, chest pain or shortness of breath, headache or dizziness, or other symptoms.  HPI     Past Medical History:  Diagnosis Date  . 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 Asthma   . Bone spur of foot   . Seasonal allergies     Patient Active Problem List   Diagnosis Date Noted  . Asthma exacerbation 04/08/2019  . Acute otitis media 12/04/2018  . Acute ear pain, left 12/04/2018  . Heel spur, left 06/11/2018  . Plantar fasciitis, left 03/11/2018  . Influenza A with respiratory manifestations 09/06/2016  . Prediabetes 05/08/2015  . Essential hypertension 05/08/2015  . Mild intermittent asthma 05/06/2015  . Leukocytosis, steroid induced  04/29/2015  . Steroid-induced hyperglycemia 04/29/2015  . Accelerated hypertension 04/28/2015  . Anemia of chroic diseae, IDA  11/17/2013  . Bronchitis, acute 04/12/2012  . Acute asthma exacerbation 03/12/2012  . Acute respiratory failure (HCC) 03/12/2012    Past Surgical History:  Procedure Laterality Date  . CESAREAN SECTION     x 4  . CHOLECYSTECTOMY    . HEEL SPUR EXCISION     08/2017  . HYSTEROSCOPY WITH D & C N/A 02/18/2015   Procedure: DILATATION AND CURETTAGE /HYSTEROSCOPY;  Surgeon: Allie Bossier, MD;  Location: [facility];  Service: Gynecology;  Laterality: N/A;  . TUBAL LIGATION       OB History    Gravida  5   Para  4   Term  4   Preterm  0   AB  1   Living  3     SAB  1   TAB  0   Ectopic  0   Multiple  0   Live Births              Family History  Problem Relation Age of Onset  . Hypertension Mother   . Stroke Sister   . Seizures Sister     Social History   Tobacco Use  . Smoking status: Former Smoker    Packs/day: 0.75    Types: Cigarettes    Quit date: 11/17/1988    Years since quitting: 30.7  . Smokeless tobacco: Never Used  Substance Use Topics  . Alcohol use: No  . Drug use: No  Home Medications Prior to Admission medications   Medication Sig Start Date End Date Taking? Authorizing Provider  albuterol (VENTOLIN HFA) 108 (90 Base) MCG/ACT inhaler INHALE 1 TO 2 PUFFS INTO THE LUNGS EVERY 6 HOURS AS NEEDED FOR WHEEZING OR SHORTNESS OF BREATH 06/30/19   Kallie Locks, FNP  Amino Acids-Protein Hydrolys (FEEDING SUPPLEMENT, PRO-STAT SUGAR FREE 64,) LIQD Take 30 mLs by mouth 2 (two) times daily. 04/10/19   Pearson Grippe, MD  amLODipine (NORVASC) 5 MG tablet Take 1 tablet (5 mg total) by mouth daily. 03/17/19   Massie Maroon, FNP  benzonatate (TESSALON) 100 MG capsule Take 1 capsule (100 mg total) by mouth 3 (three) times daily as needed for cough. 04/10/19   Pearson Grippe, MD  cetirizine (ZYRTEC) 10 MG tablet Take 1 tablet (10 mg total) by mouth daily. 03/17/19   Massie Maroon, FNP  cyclobenzaprine (FLEXERIL) 10 MG tablet Take 1 tablet (10 mg total) by mouth  at bedtime. 08/09/19   Lorelee New, PA-C  ibuprofen (ADVIL) 800 MG tablet TAKE 1 TABLET(800 MG) BY MOUTH EVERY 8 HOURS AS NEEDED 05/20/19   Vivi Barrack, DPM  ipratropium-albuterol (DUONEB) 0.5-2.5 (3) MG/3ML SOLN Take 3 mLs by nebulization every 6 (six) hours as needed. Patient not taking: Reported on 04/08/2019 05/21/18   Mike Gip, FNP  montelukast (SINGULAIR) 10 MG tablet Take 1 tablet (10 mg total) by mouth at bedtime. Patient not taking: Reported on 04/08/2019 10/29/18   Mike Gip, FNP  naproxen (NAPROSYN) 500 MG tablet Take 1 tablet (500 mg total) by mouth 2 (two) times daily. 08/09/19   Lorelee New, PA-C  Spacer/Aero-Holding Chambers (AEROCHAMBER Z-STAT PLUS/MEDIUM) inhaler Use as instructed 04/10/19   Pearson Grippe, MD  SYMBICORT 80-4.5 MCG/ACT inhaler INHALE 2 PUFFS INTO THE LUNGS 2 (TWO) TIMES DAILY. Patient taking differently: Inhale 2 puffs into the lungs 2 (two) times daily.  01/13/19   Kallie Locks, FNP  zolpidem (AMBIEN) 5 MG tablet Take 1 tablet (5 mg total) by mouth at bedtime as needed for sleep. 07/13/19   Massie Maroon, FNP    Allergies    Mobic [meloxicam] and Shrimp [shellfish allergy]  Review of Systems   Review of Systems  Physical Exam Updated Vital Signs BP (!) 151/101   Pulse 80   Temp 98.3 F (36.8 C) (Oral)   Resp 18   SpO2 99%   Physical Exam Vitals and nursing note reviewed. Exam conducted with a chaperone present.  Constitutional:      Appearance: Normal appearance.  HENT:     Head: Normocephalic and atraumatic.  Eyes:     General: No scleral icterus.    Conjunctiva/sclera: Conjunctivae normal.  Neck:     Comments: ROM fully intact.  Left-sided trapezial tenderness when turning head and contralateral direction.  Significant left-sided trapezial tenderness to palpation.  Affected area feels tense.  No significant cervical midline TTP.  No overlying skin changes. Cardiovascular:     Rate and Rhythm: Normal rate and regular  rhythm.     Pulses: Normal pulses.     Heart sounds: Normal heart sounds.  Pulmonary:     Effort: Pulmonary effort is normal.     Breath sounds: Normal breath sounds.  Musculoskeletal:     Comments: Left arm: ROM and strength fully intact.  Radial, ulnar, and median nerves assessed and are fully intact.  Cap refill and distal pulses intact.  No swelling appreciated.  Skin:    General: Skin is dry.  Neurological:     General: No focal deficit present.     Mental Status: She is alert and oriented to person, place, and time.     GCS: GCS eye subscore is 4. GCS verbal subscore is 5. GCS motor subscore is 6.     Cranial Nerves: No cranial nerve deficit.     Sensory: No sensory deficit.     Motor: No weakness.     Coordination: Coordination normal.  Psychiatric:        Mood and Affect: Mood normal.        Behavior: Behavior normal.        Thought Content: Thought content normal.     ED Results / Procedures / Treatments   Labs (all labs ordered are listed, but only abnormal results are displayed) Labs Reviewed - No data to display  EKG None  Radiology No results found.  Procedures Procedures (including critical care time)  Medications Ordered in ED Medications - No data to display  ED Course  I have reviewed the triage vital signs and the nursing notes.  Pertinent labs & imaging results that were available during my care of the patient were reviewed by me and considered in my medical decision making (see chart for details).    MDM Rules/Calculators/A&P                      Patient's history and physical exam is consistent with cervical radiculopathy, likely secondary to degenerative disc disease.  She has a history of osteoarthritis.  Her left-sided trapezial discomfort worse with glancing contralateral direction is significant for a cervical strain, as well.  Her physical exam is otherwise benign and there is no evidence of diminished strength or numbness.  She denies any  chest pain or shortness of breath and given tenderness reproducible on physical exam, appears to be musculoskeletal etiology.  Will prescribe patient naproxen as well as a short course of Flexeril for symptomatic relief.  While she has an allergy listed, she states that she can take Naproxen just fine.  She reports that she has had difficulty sleeping at night and I suspect that the Flexeril will help.  Informed her that naproxen however will be key in reducing the inflammation contributing to her radicular symptoms.  Cautioned her on the side effects of Flexeril.  Will refer her to neurosurgery for further evaluation and management of her suspected cervical DDD.  Explained to patient why imaging is not warranted at this time.  Patient is pleased, voices understanding, and is agreeable to plan.   Final Clinical Impression(s) / ED Diagnoses Final diagnoses:  Cervical radiculopathy  Strain of neck muscle, initial encounter    Rx / DC Orders ED Discharge Orders         Ordered    cyclobenzaprine (FLEXERIL) 10 MG tablet  Daily at bedtime     08/09/19 0948    naproxen (NAPROSYN) 500 MG tablet  2 times daily     08/09/19 0948           Corena Herter, PA-C 08/09/19 0948    Corena Herter, PA-C 08/09/19 0949    Tegeler, Gwenyth Allegra, MD 08/09/19 1236

## 2019-08-09 NOTE — ED Notes (Signed)
Patient Alert and oriented to baseline. Stable and ambulatory to baseline. Patient verbalized understanding of the discharge instructions.  Patient belongings were taken by the patient.   

## 2019-08-09 NOTE — Discharge Instructions (Addendum)
Please read the attachments on cervical strain rehabilitation as well as cervical radiculopathy.  Please take your medications, as prescribed. You were given a prescription for Flexeril which is a muscle relaxer.  You should not drive, work, consume alcohol, or operate machinery while taking this medication as it can make you very drowsy.    Please call [facility] schedule an appointment for ongoing evaluation of your suspected cervical degenerative disc disease.  Return to the ED or seek medical attention for any new or worsening symptoms.

## 2019-08-09 NOTE — ED Triage Notes (Signed)
Pt reports pain to L side of neck that radiates down L arm with swelling to L hand.  Pain worse at night and with movement.

## 2019-08-28 ENCOUNTER — Emergency Department (HOSPITAL_COMMUNITY): Payer: BC Managed Care – PPO

## 2019-08-28 ENCOUNTER — Encounter (HOSPITAL_COMMUNITY): Payer: Self-pay

## 2019-08-28 ENCOUNTER — Other Ambulatory Visit: Payer: Self-pay

## 2019-08-28 ENCOUNTER — Emergency Department (HOSPITAL_COMMUNITY)
Admission: EM | Admit: 2019-08-28 | Discharge: 2019-08-28 | Discharge status: Against Medical Advice | Payer: BC Managed Care – PPO | Source: Home / Self Care

## 2019-08-28 ENCOUNTER — Emergency Department (HOSPITAL_COMMUNITY)
Admission: EM | Admit: 2019-08-28 | Discharge: 2019-08-28 | Disposition: A | Discharge status: Home / Self Care | Payer: BC Managed Care – PPO | Attending: Emergency Medicine | Admitting: Emergency Medicine

## 2019-08-28 DIAGNOSIS — I10 Essential (primary) hypertension: Secondary | ICD-10-CM | POA: Diagnosis not present

## 2019-08-28 DIAGNOSIS — U071 COVID-19: Secondary | ICD-10-CM | POA: Diagnosis not present

## 2019-08-28 DIAGNOSIS — R509 Fever, unspecified: Secondary | ICD-10-CM | POA: Diagnosis present

## 2019-08-28 DIAGNOSIS — Z87891 Personal history of nicotine dependence: Secondary | ICD-10-CM | POA: Diagnosis not present

## 2019-08-28 DIAGNOSIS — Z79899 Other long term (current) drug therapy: Secondary | ICD-10-CM | POA: Insufficient documentation

## 2019-08-28 LAB — POC SARS CORONAVIRUS 2 AG -  ED: SARS Coronavirus 2 Ag: POSITIVE — AB

## 2019-08-28 MED ORDER — BENZONATATE 100 MG PO CAPS: 100.0000 mg | ORAL_CAPSULE | Freq: Three times a day (TID) | ORAL | 0 refills | Status: DC

## 2019-08-28 MED ORDER — ACETAMINOPHEN 500 MG PO TABS
1000.0000 mg | ORAL_TABLET | Freq: Once | ORAL | Status: AC
  Administered 2019-08-28: 1000 mg via ORAL
  Filled 2019-08-28: qty 2

## 2019-08-28 NOTE — ED Triage Notes (Signed)
Pt from home, c/o chills and bilateral knee pain; denies injury; took ibuprofen at 0700 this am without relief; pt tested for covid last week, negative; denies cough, sob, denies known sick contacts

## 2019-08-28 NOTE — ED Notes (Signed)
Patient verbalizes understanding of discharge instructions. Opportunity for questioning and answers were provided. Pt discharged from ED. 

## 2019-08-28 NOTE — ED Provider Notes (Signed)
[facility] EMERGENCY DEPARTMENT Provider Note   CSN: [id number] Arrival date & time: 08/28/19  1337     History No chief complaint on file.   Wendy James is a 49 y.o. female.  Patient presents the emergency department with complaint of fatigue and body aches ongoing over the past 48 hours.  Patient presents to the emergency department with a temperature of 101.9 F.  Patient has had a cough.  No ear pain, runny nose, sore throat.  She has had some nausea but no vomiting.  No diarrhea or abdominal pain.  No irritative urinary tract infection symptoms or hematuria.  No skin rashes, joint swelling or joint pain.  No chest pain or chest tightness.  No shortness of breath.  No known sick contacts however she does report being out in the rain storm last week.  She has taken ibuprofen at home.  She had a negative Covid test through her job last week but was not having symptoms at that time.  This was routine testing.        Past Medical History:  Diagnosis Date  . 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 Asthma   . Bone spur of foot   . Seasonal allergies     Patient Active Problem List   Diagnosis Date Noted  . Asthma exacerbation 04/08/2019  . Acute otitis media 12/04/2018  . Acute ear pain, left 12/04/2018  . Heel spur, left 06/11/2018  . Plantar fasciitis, left 03/11/2018  . Influenza A with respiratory manifestations 09/06/2016  . Prediabetes 05/08/2015  . Essential hypertension 05/08/2015  . Mild intermittent asthma 05/06/2015  . Leukocytosis, steroid induced  04/29/2015  . Steroid-induced hyperglycemia 04/29/2015  . Accelerated hypertension 04/28/2015  . Anemia of chroic diseae, IDA 11/17/2013  . Bronchitis, acute 04/12/2012  . Acute asthma exacerbation 03/12/2012  . Acute respiratory failure ([place]) 03/12/2012    Past Surgical History:  Procedure Laterality Date  . CESAREAN SECTION     x 4  . CHOLECYSTECTOMY    . HEEL SPUR EXCISION     08/2017  .  HYSTEROSCOPY WITH D & C N/A 02/18/2015   Procedure: DILATATION AND CURETTAGE /HYSTEROSCOPY;  Surgeon: Emily Filbert, MD;  Location: [facility];  Service: Gynecology;  Laterality: N/A;  . TUBAL LIGATION       OB History    Gravida  5   Para  4   Term  4   Preterm  0   AB  1   Living  3     SAB  1   TAB  0   Ectopic  0   Multiple  0   Live Births              Family History  Problem Relation Age of Onset  . Hypertension Mother   . Stroke Sister   . Seizures Sister     Social History   Tobacco Use  . Smoking status: Former Smoker    Packs/day: 0.75    Types: Cigarettes    Quit date: 11/17/1988    Years since quitting: 30.7  . Smokeless tobacco: Never Used  Substance Use Topics  . Alcohol use: No  . Drug use: No    Home Medications Prior to Admission medications   Medication Sig Start Date End Date Taking? Authorizing Provider  albuterol (VENTOLIN HFA) 108 (90 Base) MCG/ACT inhaler INHALE 1 TO 2 PUFFS INTO THE LUNGS EVERY 6 HOURS AS NEEDED FOR WHEEZING  OR SHORTNESS OF BREATH 06/30/19   Kallie Locks, FNP  Amino Acids-Protein Hydrolys (FEEDING SUPPLEMENT, PRO-STAT SUGAR FREE 64,) LIQD Take 30 mLs by mouth 2 (two) times daily. 04/10/19   Pearson Grippe, MD  amLODipine (NORVASC) 5 MG tablet Take 1 tablet (5 mg total) by mouth daily. 03/17/19   Massie Maroon, FNP  benzonatate (TESSALON) 100 MG capsule Take 1 capsule (100 mg total) by mouth 3 (three) times daily as needed for cough. 04/10/19   Pearson Grippe, MD  cetirizine (ZYRTEC) 10 MG tablet Take 1 tablet (10 mg total) by mouth daily. 03/17/19   Massie Maroon, FNP  cyclobenzaprine (FLEXERIL) 10 MG tablet Take 1 tablet (10 mg total) by mouth at bedtime. 08/09/19   Lorelee New, PA-C  ibuprofen (ADVIL) 800 MG tablet TAKE 1 TABLET(800 MG) BY MOUTH EVERY 8 HOURS AS NEEDED 05/20/19   Vivi Barrack, DPM  ipratropium-albuterol (DUONEB) 0.5-2.5 (3) MG/3ML SOLN Take 3 mLs by nebulization every 6 (six) hours as  needed. Patient not taking: Reported on 04/08/2019 05/21/18   Mike Gip, FNP  montelukast (SINGULAIR) 10 MG tablet Take 1 tablet (10 mg total) by mouth at bedtime. Patient not taking: Reported on 04/08/2019 10/29/18   Mike Gip, FNP  naproxen (NAPROSYN) 500 MG tablet Take 1 tablet (500 mg total) by mouth 2 (two) times daily. 08/09/19   Lorelee New, PA-C  Spacer/Aero-Holding Chambers (AEROCHAMBER Z-STAT PLUS/MEDIUM) inhaler Use as instructed 04/10/19   Pearson Grippe, MD  SYMBICORT 80-4.5 MCG/ACT inhaler INHALE 2 PUFFS INTO THE LUNGS 2 (TWO) TIMES DAILY. Patient taking differently: Inhale 2 puffs into the lungs 2 (two) times daily.  01/13/19   Kallie Locks, FNP  zolpidem (AMBIEN) 5 MG tablet Take 1 tablet (5 mg total) by mouth at bedtime as needed for sleep. 07/13/19   Massie Maroon, FNP    Allergies    Mobic [meloxicam] and Shrimp [shellfish allergy]  Review of Systems   Review of Systems  Constitutional: Positive for chills, fatigue and fever.  HENT: Negative for rhinorrhea and sore throat.   Eyes: Negative for redness.  Respiratory: Positive for cough. Negative for shortness of breath.   Cardiovascular: Negative for chest pain.  Gastrointestinal: Positive for nausea. Negative for abdominal pain, diarrhea and vomiting.  Genitourinary: Negative for dysuria.  Musculoskeletal: Positive for myalgias. Negative for arthralgias and joint swelling.  Skin: Negative for rash.  Neurological: Negative for headaches.    Physical Exam Updated Vital Signs BP (!) 193/112 (BP Location: Right Arm)   Pulse 93   Temp (!) 101.9 F (38.8 C)   Resp 18   Ht 5\' 2"  (1.575 m)   Wt 90.7 kg   SpO2 97%   BMI 36.58 kg/m   Physical Exam Vitals and nursing note reviewed.  Constitutional:      Appearance: She is well-developed.  HENT:     Head: Normocephalic and atraumatic.     Jaw: No trismus.     Right Ear: Ear canal and external ear normal. No swelling. Tympanic membrane is not  erythematous or bulging.     Left Ear: Ear canal and external ear normal. No swelling. Tympanic membrane is not erythematous or bulging.     Ears:     Comments: Hyperemic TMs bilaterally but not bulging and no sign of middle ear effusion.    Nose: Nose normal. No mucosal edema or rhinorrhea.     Mouth/Throat:     Mouth: Mucous membranes are not dry. No  oral lesions.     Pharynx: Uvula midline. No oropharyngeal exudate, posterior oropharyngeal erythema or uvula swelling.     Tonsils: No tonsillar abscesses.  Eyes:     General:        Right eye: No discharge.        Left eye: No discharge.     Conjunctiva/sclera: Conjunctivae normal.  Cardiovascular:     Rate and Rhythm: Normal rate and regular rhythm.     Heart sounds: Normal heart sounds.  Pulmonary:     Effort: Pulmonary effort is normal. No respiratory distress.     Breath sounds: Normal breath sounds. No wheezing or rales.     Comments: Coughing during exam. Abdominal:     Palpations: Abdomen is soft.     Tenderness: There is no abdominal tenderness.  Musculoskeletal:     Cervical back: Normal range of motion and neck supple.  Lymphadenopathy:     Cervical: No cervical adenopathy.  Skin:    General: Skin is warm and dry.  Neurological:     Mental Status: She is alert.     ED Results / Procedures / Treatments   Labs (all labs ordered are listed, but only abnormal results are displayed) Labs Reviewed  POC SARS CORONAVIRUS 2 AG -  ED - Abnormal; Notable for the following components:      Result Value   SARS Coronavirus 2 Ag POSITIVE (*)    All other components within normal limits    EKG None  Radiology DG Chest Port 1 View  Result Date: 08/28/2019 CLINICAL DATA:  Cough, flu-like symptoms EXAM: PORTABLE CHEST 1 VIEW COMPARISON:  None. FINDINGS: Normal mediastinum and cardiac silhouette. Normal pulmonary vasculature. No evidence of effusion, infiltrate, or pneumothorax. No acute bony abnormality. IMPRESSION: No acute  cardiopulmonary process. Electronically Signed   By: Genevive Bi M.D.   On: 08/28/2019 14:57    Procedures Procedures (including critical care time)  Medications Ordered in ED Medications  acetaminophen (TYLENOL) tablet 1,000 mg (1,000 mg Oral Given 08/28/19 1418)    ED Course  I have reviewed the triage vital signs and the nursing notes.  Pertinent labs & imaging results that were available during my care of the patient were reviewed by me and considered in my medical decision making (see chart for details).  Patient seen and examined.  Patient appears well, not hypoxic, in no respiratory distress.  Will obtain rapid coronavirus test given her fever and chest x-ray.  If negative, will do PCR coronavirus test and send influenza.  Anticipate discharge to home with supportive care.  Patient has taken her blood pressure medications today.  She is encouraged to continue taking her medications at home.  Vital signs reviewed and are as follows: BP (!) 193/112 (BP Location: Right Arm)   Pulse 93   Temp (!) 101.9 F (38.8 C)   Resp 18   Ht 5\' 2"  (1.575 m)   Wt 90.7 kg   SpO2 97%   BMI 36.58 kg/m   Patient has tested positive for coronavirus.  Patient has some haziness in the left base on the chest x-ray but read by radiology is normal.  Patient stable for discharged home.  I discussed isolation precautions with patient and provided her with a work note.  We discussed strict return precautions and need to return if she develops worsening shortness of breath or trouble breathing, persistent vomiting, or other concerns.  She verbalizes understanding agrees with plan.     MDM Rules/Calculators/A&P  COVID-19: Patient with COVID-19, new diagnosis.  She is not hypoxic.  She talks in full sentences without any respiratory distress.  At this point, she does not need to be admitted to hospital for further stabilization or treatment.  She does have some risk factors  including asthma, obesity, hypertension which puts her at higher risk.  We discussed strict return instructions if her breathing worsens and discussed typical timeline of coronavirus.  Discussed isolation.  HTN: Likely exacerbated by illness and ED visit, patient is compliant with her hypertension meds per her report.  We will continue these.  No concerns for endorgan damage at this time.  Final Clinical Impression(s) / ED Diagnoses Final diagnoses:  COVID-19  Hypertension, unspecified type    Rx / DC Orders ED Discharge Orders    None       Renne Crigler, Cordelia Poche 08/28/19 1518    Eber Hong, MD 08/28/19 [phone]

## 2019-08-28 NOTE — ED Notes (Signed)
Pts name called X three in lobby not present on assessment.

## 2019-08-28 NOTE — Discharge Instructions (Signed)
Your chest x-ray was read as normal although you may have mild pneumonia at the left base.  Your coronavirus test was positive today.  You should isolate yourself until February 6.  If you have more than 24 hours of improvement by February 6, you may break isolation on February 7.  Please monitor your symptoms closely at home.  Return if you have worsening shortness of breath or trouble breathing, persistent vomiting, or other concerns.

## 2019-08-28 NOTE — ED Notes (Signed)
Called pts name in lobby X3 no response. Not present on assessment. Will follow up

## 2019-08-29 ENCOUNTER — Telehealth: Payer: Self-pay | Admitting: Unknown Physician Specialty

## 2019-08-29 ENCOUNTER — Other Ambulatory Visit: Payer: Self-pay | Admitting: Unknown Physician Specialty

## 2019-08-29 DIAGNOSIS — I10 Essential (primary) hypertension: Secondary | ICD-10-CM

## 2019-08-29 DIAGNOSIS — U071 COVID-19: Secondary | ICD-10-CM

## 2019-08-29 NOTE — Telephone Encounter (Signed)
  I connected by phone with Wendy James on 08/29/2019 at 9:47 AM to discuss the potential use of an new treatment for mild to moderate COVID-19 viral infection in non-hospitalized patients.  This patient is a 49 y.o. female that meets the FDA criteria for Emergency Use Authorization of bamlanivimab or casirivimab\imdevimab.  Has a (+) direct SARS-CoV-2 viral test result  Has mild or moderate COVID-19   Is ? 51 years of age and weighs ? 40 kg  Is NOT hospitalized due to COVID-19  Is NOT requiring oxygen therapy or requiring an increase in baseline oxygen flow rate due to COVID-19  Is within 10 days of symptom onset  Has at least one of the high risk factor(s) for progression to severe COVID-19 and/or hospitalization as defined in EUA.  Criteria BMI >35   I have spoken and communicated the following to the patient or parent/caregiver:  1. FDA has authorized the emergency use of bamlanivimab and casirivimab\imdevimab for the treatment of mild to moderate COVID-19 in adults and pediatric patients with positive results of direct SARS-CoV-2 viral testing who are 49 years of age and older weighing at least 40 kg, and who are at high risk for progressing to severe COVID-19 and/or hospitalization.  2. The significant known and potential risks and benefits of bamlanivimab and casirivimab\imdevimab, and the extent to which such potential risks and benefits are unknown.  3. Information on available alternative treatments and the risks and benefits of those alternatives, including clinical trials.  4. Patients treated with bamlanivimab and casirivimab\imdevimab should continue to self-isolate and use infection control measures (e.g., wear mask, isolate, social distance, avoid sharing personal items, clean and disinfect "high touch" surfaces, and frequent handwashing) according to CDC guidelines.   5. The patient or parent/caregiver has the option to accept or refuse bamlanivimab or  casirivimab\imdevimab .  After reviewing this information with the patient, The patient agreed to proceed with receiving the casirivimab\imdevimab infusion and will be provided a copy of the Fact sheet prior to receiving the infusion. Gabriel Cirri 08/29/2019 9:47 AM  Symptom onset 1/27

## 2019-08-31 ENCOUNTER — Ambulatory Visit (HOSPITAL_COMMUNITY)
Admission: RE | Admit: 2019-08-31 | Discharge: 2019-08-31 | Disposition: A | Discharge status: Home / Self Care | Payer: BC Managed Care – PPO | Source: Ambulatory Visit | Attending: Pulmonary Disease | Admitting: Pulmonary Disease

## 2019-08-31 DIAGNOSIS — I10 Essential (primary) hypertension: Secondary | ICD-10-CM | POA: Diagnosis not present

## 2019-08-31 DIAGNOSIS — U071 COVID-19: Secondary | ICD-10-CM

## 2019-08-31 MED ORDER — SODIUM CHLORIDE 0.9 % IV SOLN
Freq: Once | INTRAVENOUS | Status: AC
  Filled 2019-08-31: qty 10

## 2019-08-31 MED ORDER — EPINEPHRINE 0.3 MG/0.3ML IJ SOAJ: 0.3000 mg | Freq: Once | INTRAMUSCULAR | Status: DC | PRN

## 2019-08-31 MED ORDER — SODIUM CHLORIDE 0.9 % IV SOLN: INTRAVENOUS | Status: DC | PRN

## 2019-08-31 MED ORDER — FAMOTIDINE IN NACL 20-0.9 MG/50ML-% IV SOLN: 20.0000 mg | Freq: Once | INTRAVENOUS | Status: DC | PRN

## 2019-08-31 MED ORDER — ALBUTEROL SULFATE HFA 108 (90 BASE) MCG/ACT IN AERS: 2.0000 | INHALATION_SPRAY | Freq: Once | RESPIRATORY_TRACT | Status: DC | PRN

## 2019-08-31 MED ORDER — METHYLPREDNISOLONE SODIUM SUCC 125 MG IJ SOLR: 125.0000 mg | Freq: Once | INTRAMUSCULAR | Status: DC | PRN

## 2019-08-31 MED ORDER — DIPHENHYDRAMINE HCL 50 MG/ML IJ SOLN: 50.0000 mg | Freq: Once | INTRAMUSCULAR | Status: DC | PRN

## 2019-08-31 NOTE — Discharge Instructions (Signed)

## 2019-08-31 NOTE — Progress Notes (Signed)
  Diagnosis: COVID-19  Physician: Dr. Wright  Procedure: [facility] Infusion [facility] Med: casirivimab\imdevimab infusion - Provided patient with casirivimab\imdevimab fact sheet for patients, parents and caregivers prior to infusion.  Complications: No immediate complications noted.  Discharge: Discharged home   Wendy James N Wendy James 08/31/2019  

## 2019-09-22 ENCOUNTER — Ambulatory Visit: Payer: BC Managed Care – PPO | Admitting: Family Medicine

## 2019-09-24 ENCOUNTER — Other Ambulatory Visit: Payer: Self-pay | Admitting: Family Medicine

## 2019-09-24 ENCOUNTER — Telehealth: Payer: Self-pay

## 2019-09-24 DIAGNOSIS — G4709 Other insomnia: Secondary | ICD-10-CM

## 2019-09-24 MED ORDER — ZOLPIDEM TARTRATE 5 MG PO TABS: 5.0000 mg | ORAL_TABLET | Freq: Every evening | ORAL | 1 refills | Status: DC | PRN

## 2019-09-24 NOTE — Progress Notes (Signed)
Meds ordered this encounter  Medications  . zolpidem (AMBIEN) 5 MG tablet    Sig: Take 1 tablet (5 mg total) by mouth at bedtime as needed for sleep.    Dispense:  30 tablet    Refill:  1    Order Specific Question:   Supervising Provider    Answer:   Quentin Angst [[id number]]    Nolon Nations  APRN, MSN, FNP-C Patient Care [facility] [address] [place], [place] [address] [phone]

## 2019-09-24 NOTE — Telephone Encounter (Signed)
Received a fax for Refill request on Zolpidem. Please advise.

## 2019-10-29 ENCOUNTER — Encounter: Payer: Self-pay | Admitting: Nurse Practitioner

## 2019-10-29 ENCOUNTER — Ambulatory Visit (INDEPENDENT_AMBULATORY_CARE_PROVIDER_SITE_OTHER): Payer: BC Managed Care – PPO | Admitting: Nurse Practitioner

## 2019-10-29 ENCOUNTER — Other Ambulatory Visit: Payer: Self-pay

## 2019-10-29 DIAGNOSIS — J4520 Mild intermittent asthma, uncomplicated: Secondary | ICD-10-CM

## 2019-10-29 DIAGNOSIS — G4700 Insomnia, unspecified: Secondary | ICD-10-CM | POA: Diagnosis not present

## 2019-10-29 DIAGNOSIS — J302 Other seasonal allergic rhinitis: Secondary | ICD-10-CM

## 2019-10-29 MED ORDER — SYMBICORT 80-4.5 MCG/ACT IN AERO: INHALATION_SPRAY | RESPIRATORY_TRACT | 5 refills | Status: DC

## 2019-10-29 MED ORDER — ALBUTEROL SULFATE HFA 108 (90 BASE) MCG/ACT IN AERS: INHALATION_SPRAY | RESPIRATORY_TRACT | 3 refills | Status: DC

## 2019-10-29 MED ORDER — MONTELUKAST SODIUM 10 MG PO TABS: 10.0000 mg | ORAL_TABLET | Freq: Every day | ORAL | 3 refills | Status: DC

## 2019-10-29 MED ORDER — BENZONATATE 100 MG PO CAPS: 100.0000 mg | ORAL_CAPSULE | Freq: Three times a day (TID) | ORAL | 0 refills | Status: DC

## 2019-10-29 MED ORDER — IPRATROPIUM-ALBUTEROL 0.5-2.5 (3) MG/3ML IN SOLN: 3.0000 mL | Freq: Four times a day (QID) | RESPIRATORY_TRACT | 11 refills | Status: DC | PRN

## 2019-10-29 MED ORDER — ZOLPIDEM TARTRATE 10 MG PO TABS: 10.0000 mg | ORAL_TABLET | Freq: Every evening | ORAL | 0 refills | Status: DC | PRN

## 2019-10-29 NOTE — Progress Notes (Signed)
Virtual telephone visit      Virtual Visit via Telephone Note   This visit type was conducted due to national recommendations for restrictions regarding the COVID-19 Pandemic (e.g. social distancing) in an effort to limit this patient's exposure and mitigate transmission in our community. Due to her co-morbid illnesses, this patient is at least at moderate risk for complications without adequate follow up. This format is felt to be most appropriate for this patient at this time. The patient did not have access to video technology or had technical difficulties with video requiring transitioning to audio format only (telephone). Physical exam was limited to content and character of the telephone converstion.    Patient location: home Provider location: In office    Patient: Wendy James   DOB: 01/13/1971   49 y.o. Female  MRN: [id number] Visit Date: 10/30/2019  Today's Provider: Vevelyn Francois, NP  Subjective:    Chief Complaint  Patient presents with  . Asthma  . Cough    requesting tessalon pearls   . Anxiety    dosage needs to be adjusted.   . Medication Refill    ambien and singular    HPI  Asthma Patient presents for evaluation of dyspnea, non-productive cough and wheezing. The patient has been previously diagnosed with asthma. Symptoms currently include dyspnea, non-productive cough and wheezing and occur weekly. Observed precipitants include: pollens. Current limitations in activity from asthma: none. Number of days of school or work missed in the last month: 0.  Does she do nebulizer treatments? yes Does she use an inhaler? yes Does she use a spacer with MDIs? yes Does she monitor peak flow rates? no  What is her personal best peak flow rate: na  Anxiety Patient is here for evaluation of anxiety.  She has the following anxiety symptoms: insomnia. Onset of symptoms was approximately several months ago.  Symptoms have been gradually worsening since that time. She  denies current suicidal and homicidal ideation. Family history significant for no psychiatric illness.Possible organic causes contributing are: endocrine/metabolic. Risk factors: none Previous treatment includes none.   She complains of the following medication side effects: none. She admits that if she can treat the insomnia then she can control the daytime anxiety. She is working full time and admits that she is getting ready to start 6-7 day work weeks for the next several months and she has to be able to rest.      Medications: Outpatient Medications Prior to Visit  Medication Sig  . amLODipine (NORVASC) 5 MG tablet Take 1 tablet (5 mg total) by mouth daily.  . cetirizine (ZYRTEC) 10 MG tablet Take 1 tablet (10 mg total) by mouth daily.  Marland Kitchen ibuprofen (ADVIL) 800 MG tablet TAKE 1 TABLET(800 MG) BY MOUTH EVERY 8 HOURS AS NEEDED  . Spacer/Aero-Holding Chambers (AEROCHAMBER Z-STAT PLUS/MEDIUM) inhaler Use as instructed  . [DISCONTINUED] albuterol (VENTOLIN HFA) 108 (90 Base) MCG/ACT inhaler INHALE 1 TO 2 PUFFS INTO THE LUNGS EVERY 6 HOURS AS NEEDED FOR WHEEZING OR SHORTNESS OF BREATH  . [DISCONTINUED] benzonatate (TESSALON) 100 MG capsule Take 1 capsule (100 mg total) by mouth every 8 (eight) hours.  . [DISCONTINUED] ipratropium-albuterol (DUONEB) 0.5-2.5 (3) MG/3ML SOLN Take 3 mLs by nebulization every 6 (six) hours as needed.  . [DISCONTINUED] montelukast (SINGULAIR) 10 MG tablet Take 1 tablet (10 mg total) by mouth at bedtime.  . Amino Acids-Protein Hydrolys (FEEDING SUPPLEMENT, PRO-STAT SUGAR FREE 64,) LIQD Take 30 mLs by mouth 2 (two) times daily. (  Patient not taking: Reported on 10/29/2019)  . cyclobenzaprine (FLEXERIL) 10 MG tablet Take 1 tablet (10 mg total) by mouth at bedtime. (Patient not taking: Reported on 10/29/2019)  . [DISCONTINUED] naproxen (NAPROSYN) 500 MG tablet Take 1 tablet (500 mg total) by mouth 2 (two) times daily. (Patient not taking: Reported on 10/29/2019)  . [DISCONTINUED]  SYMBICORT 80-4.5 MCG/ACT inhaler INHALE 2 PUFFS INTO THE LUNGS 2 (TWO) TIMES DAILY. (Patient not taking: Reported on 10/29/2019)  . [DISCONTINUED] zolpidem (AMBIEN) 5 MG tablet Take 1 tablet (5 mg total) by mouth at bedtime as needed for sleep. (Patient not taking: Reported on 10/29/2019)   No facility-administered medications prior to visit.    Review of Systems      Objective:    There were no vitals taken for this visit.         Assessment & Plan:     Assessment  Primary Diagnosis & Pertinent Problem List: The primary encounter diagnosis was Mild intermittent asthma without complication. Diagnoses of Insomnia, unspecified type, Mild intermittent asthma, uncomplicated, and Seasonal allergic rhinitis, unspecified trigger were also pertinent to this visit.  Visit Diagnosis: 1. Mild intermittent asthma without complication   2. Insomnia, unspecified type   3. Mild intermittent asthma, uncomplicated   4. Seasonal allergic rhinitis, unspecified trigger     Plan of Care  Pharmacotherapy (Medications Ordered): Meds ordered this encounter  Medications  . benzonatate (TESSALON) 100 MG capsule    Sig: Take 1 capsule (100 mg total) by mouth every 8 (eight) hours.    Dispense:  15 capsule    Refill:  0    Order Specific Question:   Supervising Provider    Answer:   Quentin Angst [id number]  . montelukast (SINGULAIR) 10 MG tablet    Sig: Take 1 tablet (10 mg total) by mouth at bedtime.    Dispense:  30 tablet    Refill:  3    Order Specific Question:   Supervising Provider    Answer:   Quentin Angst [id number]  . zolpidem (AMBIEN) 10 MG tablet    Sig: Take 1 tablet (10 mg total) by mouth at bedtime as needed for sleep.    Dispense:  90 tablet    Refill:  0    Order Specific Question:   Supervising Provider    Answer:   Quentin Angst [id number]  . ipratropium-albuterol (DUONEB) 0.5-2.5 (3) MG/3ML SOLN    Sig: Take 3 mLs by nebulization every 6 (six) hours as  needed.    Dispense:  360 mL    Refill:  11    Order Specific Question:   Supervising Provider    Answer:   Quentin Angst [id number]  . albuterol (VENTOLIN HFA) 108 (90 Base) MCG/ACT inhaler    Sig: INHALE 1 TO 2 PUFFS INTO THE LUNGS EVERY 6 HOURS AS NEEDED FOR WHEEZING OR SHORTNESS OF BREATH    Dispense:  6.7 g    Refill:  3    Order Specific Question:   Supervising Provider    AnswerQuentin Angst [id number]  . SYMBICORT 80-4.5 MCG/ACT inhaler    Sig: INHALE 2 PUFFS INTO THE LUNGS 2 (TWO) TIMES DAILY.    Dispense:  1 Inhaler    Refill:  5    Order Specific Question:   Supervising Provider    Answer:   Quentin Angst [id number]    I discussed the assessment and treatment plan with the patient. The patient was provided  an opportunity to ask questions and all were answered. The patient agreed with the plan and demonstrated an understanding of the instructions.   The patient was advised to call back or seek an in-person evaluation if the symptoms worsen or if the condition fails to improve as anticipated.  I provided 13 minutes of non-face-to-face time during this encounter.   Barbette Merino, NP  [facility] Patient [facility] [phone] (phone) [phone] (fax)  [facility]

## 2019-11-10 MED FILL — ALBUTEROL SULFATE HFA 108 (: 108 (90 BAS | 25 days supply | Qty: 18 | Fill #0

## 2019-11-11 ENCOUNTER — Other Ambulatory Visit: Payer: Self-pay

## 2019-11-11 DIAGNOSIS — I10 Essential (primary) hypertension: Secondary | ICD-10-CM

## 2019-11-11 MED ORDER — AMLODIPINE BESYLATE 5 MG PO TABS: 5.0000 mg | ORAL_TABLET | Freq: Every day | ORAL | 5 refills | Status: DC

## 2019-11-24 ENCOUNTER — Other Ambulatory Visit: Payer: Self-pay | Admitting: Family Medicine

## 2019-11-24 DIAGNOSIS — J4520 Mild intermittent asthma, uncomplicated: Secondary | ICD-10-CM

## 2019-12-10 ENCOUNTER — Encounter: Payer: Self-pay | Admitting: Nurse Practitioner

## 2019-12-10 ENCOUNTER — Ambulatory Visit (INDEPENDENT_AMBULATORY_CARE_PROVIDER_SITE_OTHER): Payer: BC Managed Care – PPO | Admitting: Nurse Practitioner

## 2019-12-10 ENCOUNTER — Other Ambulatory Visit: Payer: Self-pay

## 2019-12-10 VITALS — BP 127/74 | HR 71 | Temp 98.1°F | Ht 62.0 in | Wt 219.0 lb

## 2019-12-10 DIAGNOSIS — Z0000 Encounter for general adult medical examination without abnormal findings: Secondary | ICD-10-CM | POA: Diagnosis not present

## 2019-12-10 DIAGNOSIS — R5383 Other fatigue: Secondary | ICD-10-CM

## 2019-12-10 DIAGNOSIS — I10 Essential (primary) hypertension: Secondary | ICD-10-CM | POA: Diagnosis not present

## 2019-12-10 DIAGNOSIS — Z6841 Body Mass Index (BMI) 40.0 and over, adult: Secondary | ICD-10-CM | POA: Diagnosis not present

## 2019-12-10 DIAGNOSIS — G4700 Insomnia, unspecified: Secondary | ICD-10-CM

## 2019-12-10 DIAGNOSIS — K5900 Constipation, unspecified: Secondary | ICD-10-CM

## 2019-12-10 DIAGNOSIS — J4520 Mild intermittent asthma, uncomplicated: Secondary | ICD-10-CM

## 2019-12-10 MED ORDER — PHENTERMINE HCL 37.5 MG PO CAPS: 37.5000 mg | ORAL_CAPSULE | Freq: Every day | ORAL | 0 refills | Status: DC

## 2019-12-10 NOTE — Patient Instructions (Signed)
Healthy Eating Following a healthy eating pattern may help you to achieve and maintain a healthy body weight, reduce the risk of chronic disease, and live a long and productive life. It is important to follow a healthy eating pattern at an appropriate calorie level for your body. Your nutritional needs should be met primarily through food by choosing a variety of nutrient-rich foods. What are tips for following this plan? Reading food labels  Read labels and choose the following: ? Reduced or low sodium. ? Juices with 100% fruit juice. ? Foods with low saturated fats and high polyunsaturated and monounsaturated fats. ? Foods with whole grains, such as whole wheat, cracked wheat, brown rice, and wild rice. ? Whole grains that are fortified with folic acid. This is recommended for women who are pregnant or who want to become pregnant.  Read labels and avoid the following: ? Foods with a lot of added sugars. These include foods that contain brown sugar, corn sweetener, corn syrup, dextrose, fructose, glucose, high-fructose corn syrup, honey, invert sugar, lactose, malt syrup, maltose, molasses, raw sugar, sucrose, trehalose, or turbinado sugar.  Do not eat more than the following amounts of added sugar per day:  6 teaspoons (25 g) for women.  9 teaspoons (38 g) for men. ? Foods that contain processed or refined starches and grains. ? Refined grain products, such as white flour, degermed cornmeal, white bread, and white rice. Shopping  Choose nutrient-rich snacks, such as vegetables, whole fruits, and nuts. Avoid high-calorie and high-sugar snacks, such as potato chips, fruit snacks, and candy.  Use oil-based dressings and spreads on foods instead of solid fats such as butter, stick margarine, or cream cheese.  Limit pre-made sauces, mixes, and "instant" products such as flavored rice, instant noodles, and ready-made pasta.  Try more plant-protein sources, such as tofu, tempeh, black beans,  edamame, lentils, nuts, and seeds.  Explore eating plans such as the Mediterranean diet or vegetarian diet. Cooking  Use oil to saut or stir-fry foods instead of solid fats such as butter, stick margarine, or lard.  Try baking, boiling, grilling, or broiling instead of frying.  Remove the fatty part of meats before cooking.  Steam vegetables in water or broth. Meal planning   At meals, imagine dividing your plate into fourths: ? One-half of your plate is fruits and vegetables. ? One-fourth of your plate is whole grains. ? One-fourth of your plate is protein, especially lean meats, poultry, eggs, tofu, beans, or nuts.  Include low-fat dairy as part of your daily diet. Lifestyle  Choose healthy options in all settings, including home, work, school, restaurants, or stores.  Prepare your food safely: ? Wash your hands after handling raw meats. ? Keep food preparation surfaces clean by regularly washing with hot, soapy water. ? Keep raw meats separate from ready-to-eat foods, such as fruits and vegetables. ? Cook seafood, meat, poultry, and eggs to the recommended internal temperature. ? Store foods at safe temperatures. In general:  Keep cold foods at 59F (4.4C) or below.  Keep hot foods at 159F (60C) or above.  Keep your freezer at [facility] (-17.8C) or below.  Foods are no longer safe to eat when they have been between the temperatures of 40-159F (4.4-60C) for more than 2 hours. What foods should I eat? Fruits Aim to eat 2 cup-equivalents of fresh, canned (in natural juice), or frozen fruits each day. Examples of 1 cup-equivalent of fruit include 1 small apple, 8 large strawberries, 1 cup canned fruit,  cup  dried fruit, or 1 cup 100% juice. Vegetables Aim to eat 2-3 cup-equivalents of fresh and frozen vegetables each day, including different varieties and colors. Examples of 1 cup-equivalent of vegetables include 2 medium carrots, 2 cups raw, leafy greens, 1 cup chopped  vegetable (raw or cooked), or 1 medium baked potato. Grains Aim to eat 6 ounce-equivalents of whole grains each day. Examples of 1 ounce-equivalent of grains include 1 slice of bread, 1 cup ready-to-eat cereal, 3 cups popcorn, or  cup cooked rice, pasta, or cereal. Meats and other proteins Aim to eat 5-6 ounce-equivalents of protein each day. Examples of 1 ounce-equivalent of protein include 1 egg, 1/2 cup nuts or seeds, or 1 tablespoon (16 g) peanut butter. A cut of meat or fish that is the size of a deck of cards is about 3-4 ounce-equivalents.  Of the protein you eat each week, try to have at least 8 ounces come from seafood. This includes salmon, trout, herring, and anchovies. Dairy Aim to eat 3 cup-equivalents of fat-free or low-fat dairy each day. Examples of 1 cup-equivalent of dairy include 1 cup (240 mL) milk, 8 ounces (250 g) yogurt, 1 ounces (44 g) natural cheese, or 1 cup (240 mL) fortified soy milk. Fats and oils  Aim for about 5 teaspoons (21 g) per day. Choose monounsaturated fats, such as canola and olive oils, avocados, peanut butter, and most nuts, or polyunsaturated fats, such as sunflower, corn, and soybean oils, walnuts, pine nuts, sesame seeds, sunflower seeds, and flaxseed. Beverages  Aim for six 8-oz glasses of water per day. Limit coffee to three to five 8-oz cups per day.  Limit caffeinated beverages that have added calories, such as soda and energy drinks.  Limit alcohol intake to no more than 1 drink a day for nonpregnant women and 2 drinks a day for men. One drink equals 12 oz of beer (355 mL), 5 oz of wine (148 mL), or 1 oz of hard liquor (44 mL). Seasoning and other foods  Avoid adding excess amounts of salt to your foods. Try flavoring foods with herbs and spices instead of salt.  Avoid adding sugar to foods.  Try using oil-based dressings, sauces, and spreads instead of solid fats. This information is based on general [place] nutrition guidelines. For more  information, visit [employer].es. Exact amounts may vary based on your nutrition needs. Summary  A healthy eating plan may help you to maintain a healthy weight, reduce the risk of chronic diseases, and stay active throughout your life.  Plan your meals. Make sure you eat the right portions of a variety of nutrient-rich foods.  Try baking, boiling, grilling, or broiling instead of frying.  Choose healthy options in all settings, including home, work, school, restaurants, or stores. This information is not intended to replace advice given to you by your health care provider. Make sure you discuss any questions you have with your health care provider. Document Revised: 10/28/2017 Document Reviewed: 10/28/2017 Elsevier Patient Education  [place].

## 2019-12-10 NOTE — Progress Notes (Signed)
[facility] Patient [facility] [address] [place], [place]  [address] Phone:  [phone]   Fax:  [phone]   Established Patient Office Visit  Subjective:  Patient ID: Wendy James, female    DOB: 03/25/1971  Age: 49 y.o. MRN: [id number]  CC:  Chief Complaint  Patient presents with  . Follow-up    Asthma  . Weight Gain    Seeking wt loss     HPI Wendy James presents for follow-up.  She  has a past medical history of Arthritis, Asthma, Bone spur of foot, and Seasonal allergies.   Asthma Patient presents for evaluation of Asthma.. The patient has been previously diagnosed with asthma. Symptoms currently include dyspnea, non-productive cough and wheezing and occur daily. Observed precipitants include: dust and fumes. Current limitations in activity from asthma: none. Number of days of school or work missed in the last month: 0.  She is currently using her nebulizer twice a day.  She admits that this has been very effective in helping her asthma to be well controlled.  She was diagnosed with coronavirus in January.  She admits that she was not hospitalized however she did not receive immunotherapy.  She was out of work for 2 months but has been doing well since.  She is currently working on lifestyle modifications and feels like this has contributed greatly to her overall health.  Hypertension Patient is here for follow-up of elevated blood pressure. She is not exercising and is adherent to a low-salt diet. Blood pressure is well controlled at home. Cardiac symptoms: Shortness of breath which is related to asthma. Patient denies irregular heart beat, lower extremity edema and palpitations. Cardiovascular risk factors: obesity (BMI >= 30 kg/m2) and sedentary lifestyle. Use of agents associated with hypertension: none and Inhaled corticosteroid. History of target organ damage: none.  She is also following up for insomnia. Onset was several months ago. Patient describes  symptoms as difficulty falling asleep and non-restful sleep. Patient has found minimal relief with prescription sleep aid, Benadryl and ZzzQuil. Associated symptoms include: fatigue, stress and Menopausal symptoms. Patient denies anxiety, daytime somnolence, depression, leg cramps and restless legs. Symptoms have stabilized since being on the Ambien. Call  Past Medical History:  Diagnosis Date  . 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 Asthma   . Bone spur of foot   . Seasonal allergies     Past Surgical History:  Procedure Laterality Date  . CESAREAN SECTION     x 4  . CHOLECYSTECTOMY    . HEEL SPUR EXCISION     08/2017  . HYSTEROSCOPY WITH D & C N/A 02/18/2015   Procedure: DILATATION AND CURETTAGE /HYSTEROSCOPY;  Surgeon: Allie Bossier, MD;  Location: [facility];  Service: Gynecology;  Laterality: N/A;  . TUBAL LIGATION      Family History  Problem Relation Age of Onset  . Hypertension Mother   . Stroke Sister   . Seizures Sister     Social History   Socioeconomic History  . Marital status: Married    Spouse name: Not on file  . Number of children: Not on file  . Years of education: Not on file  . Highest education level: Not on file  Occupational History  . Not on file  Tobacco Use  . Smoking status: Former Smoker    Packs/day: 0.75    Types: Cigarettes    Quit date: 11/17/1988    Years since quitting: 31.0  . Smokeless  tobacco: Never Used  Substance and Sexual Activity  . Alcohol use: No  . Drug use: No  . Sexual activity: Yes    Birth control/protection: Surgical  Other Topics Concern  . Not on file  Social History Narrative  . Not on file   Social Determinants of Health   Financial Resource Strain:   . Difficulty of Paying Living Expenses:   Food Insecurity:   . Worried About Programme researcher, broadcasting/film/video in the Last Year:   . Barista in the Last Year:   Transportation Needs:   . Freight forwarder (Medical):   Marland Kitchen Lack of Transportation (Non-Medical):    Physical Activity:   . Days of Exercise per Week:   . Minutes of Exercise per Session:   Stress:   . Feeling of Stress :   Social Connections:   . Frequency of Communication with Friends and Family:   . Frequency of Social Gatherings with Friends and Family:   . Attends Religious Services:   . Active Member of Clubs or Organizations:   . Attends Banker Meetings:   Marland Kitchen Marital Status:   Intimate Partner Violence:   . Fear of Current or Ex-Partner:   . Emotionally Abused:   Marland Kitchen Physically Abused:   . Sexually Abused:     Outpatient Medications Prior to Visit  Medication Sig Dispense Refill  . albuterol (VENTOLIN HFA) 108 (90 Base) MCG/ACT inhaler INHALE 1 TO 2 PUFFS INTO THE LUNGS EVERY 6 HOURS AS NEEDED FOR WHEEZING OR SHORTNESS OF BREATH 6.7 g 3  . amLODipine (NORVASC) 5 MG tablet Take 1 tablet (5 mg total) by mouth daily. 30 tablet 5  . benzonatate (TESSALON) 100 MG capsule Take 1 capsule (100 mg total) by mouth every 8 (eight) hours. 15 capsule 0  . cetirizine (ZYRTEC) 10 MG tablet Take 1 tablet (10 mg total) by mouth daily. 30 tablet 11  . montelukast (SINGULAIR) 10 MG tablet Take 1 tablet (10 mg total) by mouth at bedtime. 30 tablet 3  . Spacer/Aero-Holding Chambers (AEROCHAMBER Z-STAT PLUS/MEDIUM) inhaler Use as instructed 1 each 2  . SYMBICORT 80-4.5 MCG/ACT inhaler INHALE 2 PUFFS INTO THE LUNGS 2 (TWO) TIMES DAILY. 1 Inhaler 5  . zolpidem (AMBIEN) 10 MG tablet Take 1 tablet (10 mg total) by mouth at bedtime as needed for sleep. 90 tablet 0  . Amino Acids-Protein Hydrolys (FEEDING SUPPLEMENT, PRO-STAT SUGAR FREE 64,) LIQD Take 30 mLs by mouth 2 (two) times daily. (Patient not taking: Reported on 10/29/2019) 887 mL 0  . cyclobenzaprine (FLEXERIL) 10 MG tablet Take 1 tablet (10 mg total) by mouth at bedtime. (Patient not taking: Reported on 12/10/2019) 14 tablet 0  . ibuprofen (ADVIL) 800 MG tablet TAKE 1 TABLET(800 MG) BY MOUTH EVERY 8 HOURS AS NEEDED (Patient not taking:  Reported on 12/10/2019) 30 tablet 0  . ipratropium-albuterol (DUONEB) 0.5-2.5 (3) MG/3ML SOLN Take 3 mLs by nebulization every 6 (six) hours as needed. (Patient not taking: Reported on 12/10/2019) 360 mL 11   No facility-administered medications prior to visit.    Allergies  Allergen Reactions  . Mobic [Meloxicam] Hives    Pt reports she gets real bad hives from Mobic  . Shrimp [Shellfish Allergy] Other (See Comments)    Wheezing.  Patient states she is ok with betadine    ROS Review of Systems  All other systems reviewed and are negative.     Objective:    Physical Exam  Constitutional: She is  oriented to person, place, and time. She appears well-developed.  Obese  HENT:  Head: Normocephalic.  Cardiovascular: Normal rate, regular rhythm, normal heart sounds and intact distal pulses.  Pulmonary/Chest:  Diminished breath sounds throughout no wheezing  Abdominal: Soft.  Hypoactive  Musculoskeletal:        General: Normal range of motion.     Cervical back: Normal range of motion.  Neurological: She is alert and oriented to person, place, and time.  Skin: Skin is warm and dry.  Psychiatric: She has a normal mood and affect. Her behavior is normal. Judgment and thought content normal.    BP 127/74   Pulse 71   Temp 98.1 F (36.7 C)   Ht 5\' 2"  (1.575 m)   Wt 219 lb (99.3 kg)   SpO2 97%   BMI 40.06 kg/m  Wt Readings from Last 3 Encounters:  12/10/19 219 lb (99.3 kg)  08/28/19 200 lb (90.7 kg)  04/08/19 220 lb (99.8 kg)     Health Maintenance Due  Topic Date Due  . COVID-19 Vaccine (1) Never done  . MAMMOGRAM  11/04/2018  . COLONOSCOPY  Never done    There are no preventive care reminders to display for this patient.  Lab Results  Component Value Date   TSH 0.165 (L) 04/09/2019   Lab Results  Component Value Date   WBC 14.2 (H) 04/10/2019   HGB 11.8 (L) 04/10/2019   HCT 37.6 04/10/2019   MCV 95.9 04/10/2019   PLT 174 04/10/2019   Lab Results   Component Value Date   NA 141 04/10/2019   K 4.0 04/10/2019   CO2 26 04/10/2019   GLUCOSE 146 (H) 04/10/2019   BUN 15 04/10/2019   CREATININE 0.63 04/10/2019   BILITOT 0.6 04/10/2019   ALKPHOS 67 04/10/2019   AST 18 04/10/2019   ALT 26 04/10/2019   PROT 6.6 04/10/2019   ALBUMIN 3.5 04/10/2019   CALCIUM 8.7 (L) 04/10/2019   ANIONGAP 9 04/10/2019   Lab Results  Component Value Date   CHOL 186 01/05/2013   Lab Results  Component Value Date   HDL 67 01/05/2013   Lab Results  Component Value Date   LDLCALC 99 01/05/2013   Lab Results  Component Value Date   TRIG 99 01/05/2013   Lab Results  Component Value Date   CHOLHDL 2.8 01/05/2013   Lab Results  Component Value Date   HGBA1C 5.6 03/17/2019      Assessment & Plan:   Problem List Items Addressed This Visit      Unprioritized   Essential hypertension   Relevant Orders   Comp. Metabolic Panel (12)   Mild intermittent asthma    Other Visit Diagnoses    Morbid obesity with BMI of 40.0-44.9, adult (HCC)    -  Primary   Phentermine 37.5 mg daily x30 days Continue lifestyle modification   Healthcare maintenance       Relevant Orders   Vitamin B12   Magnesium   VITAMIN D 25 Hydroxy (Vit-D Deficiency, Fractures)   CBC with Differential/Platelet   TSH   T4, free   T3   Insomnia, unspecified type       Constipation, unspecified constipation type       Encouraged daily stool softeners MiraLAX   Fatigue, unspecified type       Labs pending Encourage multivitamin and exercise      No orders of the defined types were placed in this encounter.   Follow-up: Return in  about 4 weeks (around 01/07/2020) for follow up.    Barbette Merino, NP

## 2019-12-11 LAB — CBC WITH DIFFERENTIAL/PLATELET
Basophils Absolute: 0 10*3/uL (ref 0.0–0.2)
Basos: 1 %
EOS (ABSOLUTE): 0.1 10*3/uL (ref 0.0–0.4)
Eos: 3 %
Hematocrit: 37.9 % (ref 34.0–46.6)
Hemoglobin: 12.5 g/dL (ref 11.1–15.9)
Immature Grans (Abs): 0 10*3/uL (ref 0.0–0.1)
Immature Granulocytes: 0 %
Lymphocytes Absolute: 1.6 10*3/uL (ref 0.7–3.1)
Lymphs: 45 %
MCH: 29.3 pg (ref 26.6–33.0)
MCHC: 33 g/dL (ref 31.5–35.7)
MCV: 89 fL (ref 79–97)
Monocytes Absolute: 0.4 10*3/uL (ref 0.1–0.9)
Monocytes: 10 %
Neutrophils Absolute: 1.5 10*3/uL (ref 1.4–7.0)
Neutrophils: 41 %
Platelets: 224 10*3/uL (ref 150–450)
RBC: 4.26 x10E6/uL (ref 3.77–5.28)
RDW: 13.4 % (ref 11.7–15.4)
WBC: 3.6 10*3/uL (ref 3.4–10.8)

## 2019-12-11 LAB — COMP. METABOLIC PANEL (12)
AST: 36 IU/L (ref 0–40)
Albumin/Globulin Ratio: 1.2 (ref 1.2–2.2)
Albumin: 3.8 g/dL (ref 3.8–4.9)
Alkaline Phosphatase: 84 IU/L (ref 39–117)
BUN/Creatinine Ratio: 14 (ref 9–23)
BUN: 8 mg/dL (ref 6–24)
Bilirubin Total: 0.7 mg/dL (ref 0.0–1.2)
Calcium: 9.2 mg/dL (ref 8.7–10.2)
Chloride: 105 mmol/L (ref 96–106)
Creatinine, Ser: 0.56 mg/dL — ABNORMAL LOW (ref 0.57–1.00)
GFR calc Af Amer: 125 mL/min/{1.73_m2} (ref >59)
GFR calc non Af Amer: 108 mL/min/{1.73_m2} (ref >59)
Globulin, Total: 3.1 g/dL (ref 1.5–4.5)
Glucose: 104 mg/dL — ABNORMAL HIGH (ref 65–99)
Potassium: 4.2 mmol/L (ref 3.5–5.2)
Sodium: 140 mmol/L (ref 134–144)
Total Protein: 6.9 g/dL (ref 6.0–8.5)

## 2019-12-11 LAB — VITAMIN B12: Vitamin B-12: 365 pg/mL (ref 232–1245)

## 2019-12-11 LAB — TSH: TSH: 0.347 u[IU]/mL — ABNORMAL LOW (ref 0.450–4.500)

## 2019-12-11 LAB — VITAMIN D 25 HYDROXY (VIT D DEFICIENCY, FRACTURES): Vit D, 25-Hydroxy: 25.8 ng/mL — ABNORMAL LOW (ref 30.0–100.0)

## 2019-12-11 LAB — MAGNESIUM: Magnesium: 1.8 mg/dL (ref 1.6–2.3)

## 2019-12-11 LAB — T3: T3, Total: 108 ng/dL (ref 71–180)

## 2019-12-11 LAB — T4, FREE: Free T4: 1.15 ng/dL (ref 0.82–1.77)

## 2019-12-14 ENCOUNTER — Encounter: Payer: Self-pay | Admitting: Nurse Practitioner

## 2019-12-14 ENCOUNTER — Other Ambulatory Visit: Payer: Self-pay | Admitting: Nurse Practitioner

## 2019-12-14 DIAGNOSIS — R7989 Other specified abnormal findings of blood chemistry: Secondary | ICD-10-CM

## 2019-12-14 DIAGNOSIS — E559 Vitamin D deficiency, unspecified: Secondary | ICD-10-CM | POA: Insufficient documentation

## 2019-12-14 NOTE — Progress Notes (Signed)
refer

## 2019-12-14 NOTE — Progress Notes (Signed)
Referral completed

## 2020-01-07 ENCOUNTER — Ambulatory Visit: Payer: BC Managed Care – PPO | Admitting: Nurse Practitioner

## 2020-01-11 ENCOUNTER — Encounter: Payer: Self-pay | Admitting: Nurse Practitioner

## 2020-01-11 ENCOUNTER — Ambulatory Visit (INDEPENDENT_AMBULATORY_CARE_PROVIDER_SITE_OTHER): Payer: BC Managed Care – PPO | Admitting: Nurse Practitioner

## 2020-01-11 ENCOUNTER — Other Ambulatory Visit: Payer: Self-pay

## 2020-01-11 VITALS — BP 160/88 | HR 76 | Temp 97.6°F | Ht 62.0 in | Wt 211.4 lb

## 2020-01-11 DIAGNOSIS — I10 Essential (primary) hypertension: Secondary | ICD-10-CM

## 2020-01-11 DIAGNOSIS — Z6841 Body Mass Index (BMI) 40.0 and over, adult: Secondary | ICD-10-CM

## 2020-01-11 MED ORDER — PHENTERMINE HCL 37.5 MG PO CAPS: 37.5000 mg | ORAL_CAPSULE | Freq: Every day | ORAL | 0 refills | Status: DC

## 2020-01-11 NOTE — Progress Notes (Signed)
[facility] Patient [facility] [address] [place], [place]  [address] Phone:  [phone]   Fax:  [phone]   Established Patient Office Visit  Subjective:  Patient ID: Wendy James, female    DOB: 1971/06/26  Age: 49 y.o. MRN: [id number]  CC:  Chief Complaint  Patient presents with  . Follow-up    HPI Wendy James presents for follow up. She  has a past medical history of Arthritis, Asthma, Bone spur of foot, and Seasonal allergies.   She is here for discussion regarding weight loss management. Patient has lost approximately 8 pounds in last 4 weeks on phentermine. She feels ideal weight is less than 200 pounds. 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 Patient denies: headache, dizziness, visual changes, shortness of breath, dyspnea on exertion, chest pain, nausea, vomiting or any edema.   Hypertension Patient is here for follow-up of elevated blood pressure. She is not exercising and is adherent to a low-salt diet. Blood pressure is not monitor at home. Cardiac symptoms: none. Patient denies chest pain, dyspnea, irregular heart beat, lower extremity edema, palpitations and syncope. Cardiovascular risk factors: obesity (BMI >= 30 kg/m2) and sedentary lifestyle. Use of agents associated with hypertension: Recently started phentermine to help with weight loss. History of target organ damage: none.   Past Medical History:  Diagnosis Date  . 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 Asthma   . Bone spur of foot   . Seasonal allergies     Past Surgical History:  Procedure Laterality Date  . CESAREAN SECTION     x 4  . CHOLECYSTECTOMY    . HEEL SPUR EXCISION     08/2017  . HYSTEROSCOPY WITH D & C N/A 02/18/2015   Procedure: DILATATION AND CURETTAGE /HYSTEROSCOPY;  Surgeon: Allie Bossier, MD;  Location: [facility];  Service: Gynecology;  Laterality: N/A;  . TUBAL LIGATION      Family History  Problem Relation Age of  Onset  . Hypertension Mother   . Stroke Sister   . Seizures Sister     Social History   Socioeconomic History  . Marital status: Married    Spouse name: Not on file  . Number of children: Not on file  . Years of education: Not on file  . Highest education level: Not on file  Occupational History  . Not on file  Tobacco Use  . Smoking status: Former Smoker    Packs/day: 0.75    Types: Cigarettes    Quit date: 11/17/1988    Years since quitting: 31.1  . Smokeless tobacco: Never Used  Vaping Use  . Vaping Use: Never used  Substance and Sexual Activity  . Alcohol use: No  . Drug use: No  . Sexual activity: Yes    Birth control/protection: Surgical  Other Topics Concern  . Not on file  Social History Narrative  . Not on file   Social Determinants of Health   Financial Resource Strain:   . Difficulty of Paying Living Expenses:   Food Insecurity:   . Worried About Programme researcher, broadcasting/film/video in the Last Year:   . Barista in the Last Year:   Transportation Needs:   . Freight forwarder (Medical):   Marland Kitchen Lack of Transportation (Non-Medical):   Physical Activity:   . Days of Exercise per Week:   . Minutes of Exercise per Session:  Stress:   . Feeling of Stress :   Social Connections:   . Frequency of Communication with Friends and Family:   . Frequency of Social Gatherings with Friends and Family:   . Attends Religious Services:   . Active Member of Clubs or Organizations:   . Attends Banker Meetings:   Marland Kitchen Marital Status:   Intimate Partner Violence:   . Fear of Current or Ex-Partner:   . Emotionally Abused:   Marland Kitchen Physically Abused:   . Sexually Abused:     Outpatient Medications Prior to Visit  Medication Sig Dispense Refill  . albuterol (VENTOLIN HFA) 108 (90 Base) MCG/ACT inhaler INHALE 1 TO 2 PUFFS INTO THE LUNGS EVERY 6 HOURS AS NEEDED FOR WHEEZING OR SHORTNESS OF BREATH 6.7 g 3  . amLODipine (NORVASC) 5 MG tablet Take 1 tablet (5 mg total) by  mouth daily. 30 tablet 5  . benzonatate (TESSALON) 100 MG capsule Take 1 capsule (100 mg total) by mouth every 8 (eight) hours. 15 capsule 0  . cetirizine (ZYRTEC) 10 MG tablet Take 1 tablet (10 mg total) by mouth daily. 30 tablet 11  . Spacer/Aero-Holding Chambers (AEROCHAMBER Z-STAT PLUS/MEDIUM) inhaler Use as instructed 1 each 2  . zolpidem (AMBIEN) 10 MG tablet Take 1 tablet (10 mg total) by mouth at bedtime as needed for sleep. 90 tablet 0  . phentermine 37.5 MG capsule Take 1 capsule (37.5 mg total) by mouth daily. 30 capsule 0  . montelukast (SINGULAIR) 10 MG tablet Take 1 tablet (10 mg total) by mouth at bedtime. (Patient not taking: Reported on 01/11/2020) 30 tablet 3  . SYMBICORT 80-4.5 MCG/ACT inhaler INHALE 2 PUFFS INTO THE LUNGS 2 (TWO) TIMES DAILY. (Patient not taking: Reported on 01/11/2020) 1 Inhaler 5   No facility-administered medications prior to visit.    Allergies  Allergen Reactions  . Mobic [Meloxicam] Hives    Pt reports she gets real bad hives from Mobic  . Shrimp [Shellfish Allergy] Other (See Comments)    Wheezing.  Patient states she is ok with betadine    ROS Review of Systems  All other systems reviewed and are negative.     Objective:    Physical Exam Constitutional:      General: She is not in acute distress.    Appearance: She is obese. She is not ill-appearing or toxic-appearing.  Cardiovascular:     Rate and Rhythm: Normal rate and regular rhythm.     Pulses: Normal pulses.     Heart sounds: Normal heart sounds.  Pulmonary:     Effort: Pulmonary effort is normal.     Breath sounds: Normal breath sounds.  Musculoskeletal:     Cervical back: Normal range of motion.     Comments: Trace edema   Skin:    General: Skin is warm and dry.  Neurological:     Mental Status: She is alert.  Psychiatric:        Mood and Affect: Mood normal.        Behavior: Behavior normal.        Thought Content: Thought content normal.        Judgment: Judgment  normal.     BP (!) 160/88 Comment: after medication  Pulse 76   Temp 97.6 F (36.4 C)   Ht 5\' 2"  (1.575 m)   Wt 211 lb 6.4 oz (95.9 kg)   SpO2 100%   BMI 38.67 kg/m  Wt Readings from Last 3 Encounters:  01/11/20 211  lb 6.4 oz (95.9 kg)  12/10/19 219 lb (99.3 kg)  08/28/19 200 lb (90.7 kg)     Health Maintenance Due  Topic Date Due  . Hepatitis C Screening  Never done  . MAMMOGRAM  11/04/2018  . COLONOSCOPY  Never done    There are no preventive care reminders to display for this patient.  Lab Results  Component Value Date   TSH 0.347 (L) 12/10/2019   Lab Results  Component Value Date   WBC 3.6 12/10/2019   HGB 12.5 12/10/2019   HCT 37.9 12/10/2019   MCV 89 12/10/2019   PLT 224 12/10/2019   Lab Results  Component Value Date   NA 140 12/10/2019   K 4.2 12/10/2019   CO2 26 04/10/2019   GLUCOSE 104 (H) 12/10/2019   BUN 8 12/10/2019   CREATININE 0.56 (L) 12/10/2019   BILITOT 0.7 12/10/2019   ALKPHOS 84 12/10/2019   AST 36 12/10/2019   ALT 26 04/10/2019   PROT 6.9 12/10/2019   ALBUMIN 3.8 12/10/2019   CALCIUM 9.2 12/10/2019   ANIONGAP 9 04/10/2019   Lab Results  Component Value Date   CHOL 186 01/05/2013   Lab Results  Component Value Date   HDL 67 01/05/2013   Lab Results  Component Value Date   LDLCALC 99 01/05/2013   Lab Results  Component Value Date   TRIG 99 01/05/2013   Lab Results  Component Value Date   CHOLHDL 2.8 01/05/2013   Lab Results  Component Value Date   HGBA1C 5.6 03/17/2019      Assessment & Plan:   Problem List Items Addressed This Visit      Cardiovascular and Mediastinum   Essential hypertension - Primary We will continue with current regimen of Norvasc 5 mg.  Patient is aware that she will need to watch her dietary intake of salt and try to incorporate with exercise into her daily routine.  We will need to monitor her blood pressure    Other Visit Diagnoses    Morbid obesity with BMI of 40.0-44.9, adult  (HCC)       Phentermine 37.5 mg daily x30 days Continue lifestyle modification and increased exercise.  Patient advised that she can only be on pharmaceutical intervention for 3 weeks per primary care office any additional supplements needed will need provided with weight management.   Relevant Medications   phentermine 37.5 MG capsule      Meds ordered this encounter  Medications  . phentermine 37.5 MG capsule    Sig: Take 1 capsule (37.5 mg total) by mouth daily.    Dispense:  30 capsule    Refill:  0    Order Specific Question:   Supervising Provider    Answer:   Tresa Garter [[id number]]    Follow-up: Return in about 4 weeks (around 02/08/2020).    Vevelyn Francois, NP

## 2020-01-11 NOTE — Patient Instructions (Signed)
   Managing Your Hypertension Hypertension is commonly called high blood pressure. This is when the force of your blood pressing against the walls of your arteries is too strong. Arteries are blood vessels that carry blood from your heart throughout your body. Hypertension forces the heart to work harder to pump blood, and may cause the arteries to become narrow or stiff. Having untreated or uncontrolled hypertension can cause heart attack, stroke, kidney disease, and other problems. What are blood pressure readings? A blood pressure reading consists of a higher number over a lower number. Ideally, your blood pressure should be below 120/80. The first ("top") number is called the systolic pressure. It is a measure of the pressure in your arteries as your heart beats. The second ("bottom") number is called the diastolic pressure. It is a measure of the pressure in your arteries as the heart relaxes. What does my blood pressure reading mean? Blood pressure is classified into four stages. Based on your blood pressure reading, your health care provider may use the following stages to determine what type of treatment you need, if any. Systolic pressure and diastolic pressure are measured in a unit called mm Hg. Normal  Systolic pressure: below 120.  Diastolic pressure: below 80. Elevated  Systolic pressure: 120-129.  Diastolic pressure: below 80. Hypertension stage 1  Systolic pressure: 130-139.  Diastolic pressure: 80-89. Hypertension stage 2  Systolic pressure: 140 or above.  Diastolic pressure: 90 or above. What health risks are associated with hypertension? Managing your hypertension is an important responsibility. Uncontrolled hypertension can lead to:  A heart attack.  A stroke.  A weakened blood vessel (aneurysm).  Heart failure.  Kidney damage.  Eye damage.  Metabolic syndrome.  Memory and concentration problems. What changes can I make to manage my  hypertension? Hypertension can be managed by making lifestyle changes and possibly by taking medicines. Your health care provider will help you make a plan to bring your blood pressure within a normal range. Eating and drinking   Eat a diet that is high in fiber and potassium, and low in salt (sodium), added sugar, and fat. An example eating plan is called the DASH (Dietary Approaches to Stop Hypertension) diet. To eat this way: ? Eat plenty of fresh fruits and vegetables. Try to fill half of your plate at each meal with fruits and vegetables. ? Eat whole grains, such as whole wheat pasta, brown rice, or whole grain bread. Fill about one quarter of your plate with whole grains. ? Eat low-fat diary products. ? Avoid fatty cuts of meat, processed or cured meats, and poultry with skin. Fill about one quarter of your plate with lean proteins such as fish, chicken without skin, beans, eggs, and tofu. ? Avoid premade and processed foods. These tend to be higher in sodium, added sugar, and fat.  Reduce your daily sodium intake. Most people with hypertension should eat less than 1,500 mg of sodium a day.  Limit alcohol intake to no more than 1 drink a day for nonpregnant women and 2 drinks a day for men. One drink equals 12 oz of beer, 5 oz of wine, or 1 oz of hard liquor. Lifestyle  Work with your health care provider to maintain a healthy body weight, or to lose weight. Ask what an ideal weight is for you.  Get at least 30 minutes of exercise that causes your heart to beat faster (aerobic exercise) most days of the week. Activities may include walking, swimming, or biking.    Include exercise to strengthen your muscles (resistance exercise), such as weight lifting, as part of your weekly exercise routine. Try to do these types of exercises for 30 minutes at least 3 days a week.  Do not use any products that contain nicotine or tobacco, such as cigarettes and e-cigarettes. If you need help quitting,  ask your health care provider.  Control any long-term (chronic) conditions you have, such as high cholesterol or diabetes. Monitoring  Monitor your blood pressure at home as told by your health care provider. Your personal target blood pressure may vary depending on your medical conditions, your age, and other factors.  Have your blood pressure checked regularly, as often as told by your health care provider. Working with your health care provider  Review all the medicines you take with your health care provider because there may be side effects or interactions.  Talk with your health care provider about your diet, exercise habits, and other lifestyle factors that may be contributing to hypertension.  Visit your health care provider regularly. Your health care provider can help you create and adjust your plan for managing hypertension. Will I need medicine to control my blood pressure? Your health care provider may prescribe medicine if lifestyle changes are not enough to get your blood pressure under control, and if:  Your systolic blood pressure is 130 or higher.  Your diastolic blood pressure is 80 or higher. Take medicines only as told by your health care provider. Follow the directions carefully. Blood pressure medicines must be taken as prescribed. The medicine does not work as well when you skip doses. Skipping doses also puts you at risk for problems. Contact a health care provider if:  You think you are having a reaction to medicines you have taken.  You have repeated (recurrent) headaches.  You feel dizzy.  You have swelling in your ankles.  You have trouble with your vision. Get help right away if:  You develop a severe headache or confusion.  You have unusual weakness or numbness, or you feel faint.  You have severe pain in your chest or abdomen.  You vomit repeatedly.  You have trouble breathing. Summary  Hypertension is when the force of blood pumping  through your arteries is too strong. If this condition is not controlled, it may put you at risk for serious complications.  Your personal target blood pressure may vary depending on your medical conditions, your age, and other factors. For most people, a normal blood pressure is less than 120/80.  Hypertension is managed by lifestyle changes, medicines, or both. Lifestyle changes include weight loss, eating a healthy, low-sodium diet, exercising more, and limiting alcohol. This information is not intended to replace advice given to you by your health care provider. Make sure you discuss any questions you have with your health care provider. Document Revised: 11/07/2018 Document Reviewed: 06/13/2016 Elsevier Patient Education  2020 Elsevier Inc.  

## 2020-01-12 ENCOUNTER — Other Ambulatory Visit: Payer: Self-pay | Admitting: Podiatry

## 2020-01-12 MED ORDER — IBUPROFEN 800 MG PO TABS: 800.0000 mg | ORAL_TABLET | Freq: Three times a day (TID) | ORAL | 0 refills | Status: DC | PRN

## 2020-01-15 ENCOUNTER — Telehealth: Payer: Self-pay | Admitting: *Deleted

## 2020-01-15 NOTE — Telephone Encounter (Signed)
Called patient and relayed the message per Dr Wagoner. Annaliah Rivenbark 

## 2020-01-15 NOTE — Telephone Encounter (Signed)
-----   Message from Vivi Barrack, DPM sent at 01/12/2020  5:49 PM EDT ----- Can you please I let her know I sent it over. If she is still having issues I would be happy to see her back.  ----- Message ----- From: Lanney Gins, PMAC Sent: 01/11/2020   9:19 AM EDT To: Vivi Barrack, DPM  Hey I called and spoke with the patient and the patient does not want the gabapentin and she is working 12 hours shifts and would like a refill of the ibuprofen if possible. Misty Stanley ----- Message ----- From: Vivi Barrack, DPM Sent: 01/11/2020   8:15 AM EDT To: Lanney Gins, PMAC  Can you see what dose she is taking and how often? Thanks ----- Message ----- From: Lanney Gins, PMAC Sent: 01/06/2020   3:19 PM EDT To: Vivi Barrack, DPM  [facility] is requesting a refill of gabapentin but I don't see that the patient got a prescription from [place].Please advise. Thanks Misty Stanley

## 2020-02-08 ENCOUNTER — Ambulatory Visit: Payer: BC Managed Care – PPO | Admitting: Nurse Practitioner

## 2020-02-08 ENCOUNTER — Ambulatory Visit (INDEPENDENT_AMBULATORY_CARE_PROVIDER_SITE_OTHER): Payer: BC Managed Care – PPO | Admitting: Nurse Practitioner

## 2020-02-08 ENCOUNTER — Other Ambulatory Visit: Payer: Self-pay

## 2020-02-08 ENCOUNTER — Encounter: Payer: Self-pay | Admitting: Nurse Practitioner

## 2020-02-08 DIAGNOSIS — I10 Essential (primary) hypertension: Secondary | ICD-10-CM | POA: Diagnosis not present

## 2020-02-08 DIAGNOSIS — G4700 Insomnia, unspecified: Secondary | ICD-10-CM | POA: Diagnosis not present

## 2020-02-08 DIAGNOSIS — Z6841 Body Mass Index (BMI) 40.0 and over, adult: Secondary | ICD-10-CM

## 2020-02-08 DIAGNOSIS — Z566 Other physical and mental strain related to work: Secondary | ICD-10-CM

## 2020-02-08 NOTE — Progress Notes (Signed)
[facility] Patient [facility] [address] [place], [place]  [address] Phone:  [phone]   Fax:  [phone]     Virtual telephone visit      Virtual Visit via Telephone Note   This visit type was conducted due to national recommendations for restrictions regarding the COVID-19 Pandemic (e.g. social distancing) in an effort to limit this patient's exposure and mitigate transmission in our community. Due to her co-morbid illnesses, this patient is at least at moderate risk for complications without adequate follow up. This format is felt to be most appropriate for this patient at this time. The patient did not have access to video technology or had technical difficulties with video requiring transitioning to audio format only (telephone). Physical exam was limited to content and character of the telephone converstion.    Patient location: work Provider location: office    Patient: Wendy James   DOB: 04-02-1971   49 y.o. Female  MRN: [id number] Visit Date: 02/09/2020  Today's Provider: Barbette Merino, NP  Subjective:    Chief Complaint  Patient presents with  . Follow-up    televist; needing FMLA paperwork. 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   . Medication Refill    Ambien; phentermine, Ventolin    HPI Patient was called for her televisit at 1223 however was unable to speak with patient; message was left for a return phone call.  Hypertension Patient is here for follow-up of elevated blood pressure. She is not exercising however working long shifts and is adherent to a low-salt diet. Blood pressure is not well controlled at home. Cardiac symptoms: none. Patient denies chest pain, dyspnea, fatigue, irregular heart beat, lower extremity edema, palpitations and syncope. Cardiovascular risk factors: hypertension and obesity (BMI >= 30 kg/m2). Use of agents associated with hypertension: Phentermine. History of target organ damage: none. She is under increased stress at work however the  lowest with job and wants to maintain it because she has goals. Her current weight is two oh six she is down 5 pounds from last month. Denies headache, dizziness, visual changes, shortness of breath, dyspnea on exertion, chest pain, nausea, vomiting or any edema.      Medications: Outpatient Medications Prior to Visit  Medication Sig  . albuterol (VENTOLIN HFA) 108 (90 Base) MCG/ACT inhaler INHALE 1 TO 2 PUFFS INTO THE LUNGS EVERY 6 HOURS AS NEEDED FOR WHEEZING OR SHORTNESS OF BREATH  . benzonatate (TESSALON) 100 MG capsule Take 1 capsule (100 mg total) by mouth every 8 (eight) hours.  . cetirizine (ZYRTEC) 10 MG tablet Take 1 tablet (10 mg total) by mouth daily.  Marland Kitchen ibuprofen (ADVIL) 800 MG tablet Take 1 tablet (800 mg total) by mouth every 8 (eight) hours as needed.  Marland Kitchen Spacer/Aero-Holding Chambers (AEROCHAMBER Z-STAT PLUS/MEDIUM) inhaler Use as instructed  . [DISCONTINUED] amLODipine (NORVASC) 5 MG tablet Take 1 tablet (5 mg total) by mouth daily.  . [DISCONTINUED] phentermine 37.5 MG capsule Take 1 capsule (37.5 mg total) by mouth daily.  Marland Kitchen zolpidem (AMBIEN) 10 MG tablet Take 1 tablet (10 mg total) by mouth at bedtime as needed for sleep.   No facility-administered medications prior to visit.    Review of Systems  Objective:    There were no vitals taken for this visit. BP Readings from Last 3 Encounters:  01/11/20 (!) 160/88  12/10/19 127/74  08/31/19 (!) 140/95        Assessment & Plan:    Assessment  Primary Diagnosis & Pertinent Problem  List: The primary encounter diagnosis was Insomnia, unspecified type. Diagnoses of Essential hypertension, Morbid obesity with BMI of 40.0-44.9, adult (HCC), and Work-related stress were also pertinent to this visit.  Visit Diagnosis: 1. Insomnia, unspecified type   2. Essential hypertension   3. Morbid obesity with BMI of 40.0-44.9, adult (HCC)   4. Work-related stress       Plan of Care  Pharmacotherapy (Medications Ordered): Meds  ordered this encounter  Medications  . amLODipine (NORVASC) 10 MG tablet    Sig: Take 1 tablet (10 mg total) by mouth daily.    Dispense:  90 tablet    Refill:  3    Order Specific Question:   Supervising Provider    Answer:   Quentin Angst [id number]  . phentermine 37.5 MG capsule    Sig: Take 1 capsule (37.5 mg total) by mouth daily.    Dispense:  30 capsule    Refill:  0    Order Specific Question:   Supervising Provider    Answer:   Quentin Angst [[id number]]   New Prescriptions   AMLODIPINE (NORVASC) 10 MG TABLET    Take 1 tablet (10 mg total) by mouth daily.    Future Appointments  Date Time Provider Department Center  03/04/2020  2:20 PM Shamleffer, Konrad Dolores, MD [id number] None     Patient instructions provided during this appointment: There are no Patient Instructions on file for this visit.   I discussed the assessment and treatment plan with the patient. The patient was provided an opportunity to ask questions and all were answered. The patient agreed with the plan and demonstrated an understanding of the instructions.   The patient was advised to call back or seek an in-person evaluation if the symptoms worsen or if the condition fails to improve as anticipated.  I provided 10 minutes of non-face-to-face time during this encounter.   Barbette Merino, NP  [facility] Patient [facility] [phone] (phone) [phone] (fax)  [facility]

## 2020-02-09 MED ORDER — AMLODIPINE BESYLATE 10 MG PO TABS: 10.0000 mg | ORAL_TABLET | Freq: Every day | ORAL | 3 refills | Status: DC

## 2020-02-09 MED ORDER — PHENTERMINE HCL 37.5 MG PO CAPS: 37.5000 mg | ORAL_CAPSULE | Freq: Every day | ORAL | 0 refills | Status: DC

## 2020-02-15 ENCOUNTER — Other Ambulatory Visit: Payer: Self-pay | Admitting: Nurse Practitioner

## 2020-02-15 DIAGNOSIS — G4700 Insomnia, unspecified: Secondary | ICD-10-CM

## 2020-02-15 NOTE — Telephone Encounter (Signed)
Refill request for zolpidem. Please advise.  °

## 2020-02-22 ENCOUNTER — Other Ambulatory Visit: Payer: Self-pay | Admitting: Nurse Practitioner

## 2020-02-22 NOTE — Telephone Encounter (Signed)
It was sent to the pharmacy on 02/16/20. Have her call them thanks

## 2020-02-22 NOTE — Telephone Encounter (Signed)
PT STATES SHE NEEDS REFILL

## 2020-03-04 ENCOUNTER — Ambulatory Visit (INDEPENDENT_AMBULATORY_CARE_PROVIDER_SITE_OTHER): Payer: BC Managed Care – PPO | Admitting: Nurse Practitioner

## 2020-03-04 ENCOUNTER — Other Ambulatory Visit: Payer: Self-pay

## 2020-03-04 ENCOUNTER — Ambulatory Visit: Payer: BC Managed Care – PPO | Admitting: Internal Medicine

## 2020-03-04 ENCOUNTER — Encounter: Payer: Self-pay | Admitting: Nurse Practitioner

## 2020-03-04 VITALS — BP 146/99 | HR 65 | Temp 97.5°F | Ht 62.0 in | Wt 209.0 lb

## 2020-03-04 DIAGNOSIS — J4520 Mild intermittent asthma, uncomplicated: Secondary | ICD-10-CM

## 2020-03-04 DIAGNOSIS — J301 Allergic rhinitis due to pollen: Secondary | ICD-10-CM

## 2020-03-04 DIAGNOSIS — I10 Essential (primary) hypertension: Secondary | ICD-10-CM | POA: Diagnosis not present

## 2020-03-04 DIAGNOSIS — Z1239 Encounter for other screening for malignant neoplasm of breast: Secondary | ICD-10-CM | POA: Diagnosis not present

## 2020-03-04 DIAGNOSIS — Z1211 Encounter for screening for malignant neoplasm of colon: Secondary | ICD-10-CM

## 2020-03-04 DIAGNOSIS — Z0000 Encounter for general adult medical examination without abnormal findings: Secondary | ICD-10-CM

## 2020-03-04 MED ORDER — ALBUTEROL SULFATE HFA 108 (90 BASE) MCG/ACT IN AERS: 2.0000 | INHALATION_SPRAY | Freq: Four times a day (QID) | RESPIRATORY_TRACT | 3 refills | Status: DC | PRN

## 2020-03-04 MED ORDER — LEVOCETIRIZINE DIHYDROCHLORIDE 5 MG PO TABS: 5.0000 mg | ORAL_TABLET | Freq: Every evening | ORAL | 2 refills | Status: DC

## 2020-03-04 NOTE — Progress Notes (Signed)
[facility] [address] [place], [place]  [address] Phone:  [phone]   Fax:  [phone]   Established Patient Office Visit  Subjective:  Patient ID: Wendy James, female    DOB: 1971-04-14  Age: 49 y.o. MRN: [id number]  CC:  Chief Complaint  Patient presents with   Hypertension   Weight Check    HPI Wendy James presents for follow up. She  has a past medical history of Arthritis, Asthma, Bone spur of foot, and Seasonal allergies.   Allergic Rhinitis Wendy James is here for evaluation of possible allergic rhinitis. Patient's symptoms include cough, headaches, itchy eyes, itchy nose and nasal congestion. These symptoms are perennial.  And worse at night.  Current triggers include exposure to Various triggers. The patient has been suffering from these symptoms for approximately 1-2 weeks. The patient has tried over the counter medications with fair relief of symptoms. Immunotherapy has never been tried. The patient has never had nasal polyps. The patient has a history of asthma. The patient has no history of eczema. The patient does not suffer from frequent sinopulmonary infections. The patient has not had sinus surgery in the past.  She admits that she has failed Singulair in the past.  She does continue to work full-time.  She admits that she works alone for the most part because she is trying to be careful due to her underlying conditions and previous COV- 19.  Wendy James is currently on phentermine 37.5 mg.  She admits that she has not taken in the last few weeks.  She has had a 10 pound weight loss since 2021.  She does have history of hypertension.  She admits that she did not take her amlodipine this morning she feels like her blood pressures have been maintained at the she is working long hours 6 and 7 days a week.  She is off today and plans on resting.  She denies headache, dizziness, visual changes, shortness of breath, dyspnea on exertion,  chest pain, nausea, vomiting or any edema.    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   Asthma    Bone spur of foot    Seasonal allergies     Past Surgical History:  Procedure Laterality Date   CESAREAN SECTION     x 4   CHOLECYSTECTOMY     HEEL SPUR EXCISION     08/2017   HYSTEROSCOPY WITH D & C N/A 02/18/2015   Procedure: DILATATION AND CURETTAGE /HYSTEROSCOPY;  Surgeon: Allie Bossier, MD;  Location: [facility];  Service: Gynecology;  Laterality: N/A;   TUBAL LIGATION      Family History  Problem Relation Age of Onset   Hypertension Mother    Stroke Sister    Seizures Sister     Social History   Socioeconomic History   Marital status: Married    Spouse name: Not on file   Number of children: Not on file   Years of education: Not on file   Highest education level: Not on file  Occupational History   Not on file  Tobacco Use   Smoking status: Former Smoker    Packs/day: 0.75    Types: Cigarettes    Quit date: 11/17/1988    Years since quitting: 31.3   Smokeless tobacco: Never Used  Vaping Use   Vaping Use: Never used  Substance and Sexual Activity   Alcohol use: No  Drug use: No   Sexual activity: Yes    Birth control/protection: Surgical  Other Topics Concern   Not on file  Social History Narrative   Not on file   Social Determinants of Health   Financial Resource Strain:    Difficulty of Paying Living Expenses:   Food Insecurity:    Worried About Programme researcher, broadcasting/film/video in the Last Year:    Barista in the Last Year:   Transportation Needs:    Freight forwarder (Medical):    Lack of Transportation (Non-Medical):   Physical Activity:    Days of Exercise per Week:    Minutes of Exercise per Session:   Stress:    Feeling of Stress :   Social Connections:    Frequency of Communication with Friends and Family:    Frequency of Social Gatherings with Friends and Family:     Attends Religious Services:    Active Member of Clubs or Organizations:    Attends Engineer, structural:    Marital Status:   Intimate Partner Violence:    Fear of Current or Ex-Partner:    Emotionally Abused:    Physically Abused:    Sexually Abused:     Outpatient Medications Prior to Visit  Medication Sig Dispense Refill   amLODipine (NORVASC) 10 MG tablet Take 1 tablet (10 mg total) by mouth daily. 90 tablet 3   benzonatate (TESSALON) 100 MG capsule Take 1 capsule (100 mg total) by mouth every 8 (eight) hours. 15 capsule 0   cetirizine (ZYRTEC) 10 MG tablet Take 1 tablet (10 mg total) by mouth daily. 30 tablet 11   ibuprofen (ADVIL) 800 MG tablet Take 1 tablet (800 mg total) by mouth every 8 (eight) hours as needed. 30 tablet 0   phentermine 37.5 MG capsule Take 1 capsule (37.5 mg total) by mouth daily. 30 capsule 0   Spacer/Aero-Holding Chambers (AEROCHAMBER Z-STAT PLUS/MEDIUM) inhaler Use as instructed 1 each 2   zolpidem (AMBIEN) 10 MG tablet TAKE 1 TABLET(10 MG) BY MOUTH AT BEDTIME AS NEEDED FOR SLEEP 90 tablet 0   albuterol (VENTOLIN HFA) 108 (90 Base) MCG/ACT inhaler INHALE 1 TO 2 PUFFS INTO THE LUNGS EVERY 6 HOURS AS NEEDED FOR WHEEZING OR SHORTNESS OF BREATH 6.7 g 3   No facility-administered medications prior to visit.    Allergies  Allergen Reactions   Mobic [Meloxicam] Hives    Pt reports she gets real bad hives from Mobic   Shrimp [Shellfish Allergy] Other (See Comments)    Wheezing.  Patient states she is ok with betadine    ROS Review of Systems    Objective:    Physical Exam Constitutional:      General: She is in acute distress.     Comments: Allergy related symptoms  HENT:     Head: Normocephalic.     Right Ear: Tympanic membrane normal.     Left Ear: Tympanic membrane normal.     Nose: Nose normal.     Mouth/Throat:     Mouth: Mucous membranes are moist.     Pharynx: Oropharynx is clear.  Cardiovascular:     Rate and  Rhythm: Normal rate and regular rhythm.     Pulses: Normal pulses.     Heart sounds: Normal heart sounds.  Pulmonary:     Effort: Pulmonary effort is normal.     Breath sounds: Normal breath sounds.  Musculoskeletal:        General: Normal range of  motion.     Cervical back: Normal range of motion.  Skin:    General: Skin is warm and dry.     Capillary Refill: Capillary refill takes less than 2 seconds.  Neurological:     General: No focal deficit present.     Mental Status: She is alert and oriented to person, place, and time.  Psychiatric:        Mood and Affect: Mood normal.        Behavior: Behavior normal.        Thought Content: Thought content normal.        Judgment: Judgment normal.     BP (!) 146/99    Pulse 65    Temp (!) 97.5 F (36.4 C)    Wt 209 lb (94.8 kg)    SpO2 100%    BMI 38.23 kg/m  Wt Readings from Last 3 Encounters:  03/04/20 209 lb (94.8 kg)  01/11/20 211 lb 6.4 oz (95.9 kg)  12/10/19 219 lb (99.3 kg)     Health Maintenance Due  Topic Date Due   Hepatitis C Screening  Never done   MAMMOGRAM  11/04/2018   COLONOSCOPY  Never done    There are no preventive care reminders to display for this patient.  Lab Results  Component Value Date   TSH 0.347 (L) 12/10/2019   Lab Results  Component Value Date   WBC 3.6 12/10/2019   HGB 12.5 12/10/2019   HCT 37.9 12/10/2019   MCV 89 12/10/2019   PLT 224 12/10/2019   Lab Results  Component Value Date   NA 140 12/10/2019   K 4.2 12/10/2019   CO2 26 04/10/2019   GLUCOSE 104 (H) 12/10/2019   BUN 8 12/10/2019   CREATININE 0.56 (L) 12/10/2019   BILITOT 0.7 12/10/2019   ALKPHOS 84 12/10/2019   AST 36 12/10/2019   ALT 26 04/10/2019   PROT 6.9 12/10/2019   ALBUMIN 3.8 12/10/2019   CALCIUM 9.2 12/10/2019   ANIONGAP 9 04/10/2019   Lab Results  Component Value Date   CHOL 186 01/05/2013   Lab Results  Component Value Date   HDL 67 01/05/2013   Lab Results  Component Value Date   LDLCALC  99 01/05/2013   Lab Results  Component Value Date   TRIG 99 01/05/2013   Lab Results  Component Value Date   CHOLHDL 2.8 01/05/2013   Lab Results  Component Value Date   HGBA1C 5.6 03/17/2019      Assessment & Plan:   Problem List Items Addressed This Visit      Cardiovascular and Mediastinum   Essential hypertension - Primary Encouraged on going compliance with current medication regimen Encouraged home monitoring and recording BP <130/80 Eating a heart-healthy diet with less salt Encouraged regular physical activity  Recommend Weight loss avoid the use of the phentermine with elevated blood pressure greater than 140/90      Relevant Orders   Comp. Metabolic Panel (12)    Other Visit Diagnoses    Mild intermittent asthma, uncomplicated     Continue to use current regimen.  Follow-up if symptoms persist or get worse   Relevant Medications   albuterol (VENTOLIN HFA) 108 (90 Base) MCG/ACT inhaler   Seasonal allergic rhinitis due to pollen     Patient to go back to over-the-counter cetirizine 10 mg   Encounter for screening for malignant neoplasm of breast, unspecified screening modality       Relevant Orders   MM DIGITAL  SCREENING BILATERAL   Healthcare maintenance       Relevant Orders   Hepatitis C antibody   Colon cancer screening       Relevant Orders   Ambulatory referral to Gastroenterology      Meds ordered this encounter  Medications   levocetirizine (XYZAL) 5 MG tablet    Sig: Take 1 tablet (5 mg total) by mouth every evening.    Dispense:  30 tablet    Refill:  2    Order Specific Question:   Supervising Provider    Answer:   JEFFERY, MICHAEL J [[id number]]   albuterol (VENTOLIN HFA) 108 (90 Base) MCG/ACT inhaler    Sig: Inhale 2 puffs into the lungs every 6 (six) hours as needed for wheezing or shortness of breath.    Dispense:  6.7 g    Refill:  3    Order Specific Question:   Supervising Provider    Answer:   Charma IgoJEFFERY, MICHAEL J [[id number]]     Follow-up: Return in about 3 months (around 06/04/2020) for well woman physcial.    Barbette Merinorystal M Lakeisha Waldrop, NP

## 2020-03-07 ENCOUNTER — Encounter: Payer: Self-pay | Admitting: Internal Medicine

## 2020-03-22 ENCOUNTER — Other Ambulatory Visit: Payer: Self-pay | Admitting: Nurse Practitioner

## 2020-03-22 DIAGNOSIS — J4520 Mild intermittent asthma, uncomplicated: Secondary | ICD-10-CM

## 2020-04-20 ENCOUNTER — Encounter (HOSPITAL_COMMUNITY): Payer: Self-pay | Admitting: *Deleted

## 2020-04-20 ENCOUNTER — Ambulatory Visit (HOSPITAL_COMMUNITY)
Admission: EM | Admit: 2020-04-20 | Discharge: 2020-04-20 | Disposition: A | Discharge status: Home / Self Care | Payer: BC Managed Care – PPO | Attending: Family Medicine | Admitting: Family Medicine

## 2020-04-20 ENCOUNTER — Other Ambulatory Visit: Payer: Self-pay

## 2020-04-20 DIAGNOSIS — M5412 Radiculopathy, cervical region: Secondary | ICD-10-CM

## 2020-04-20 MED ORDER — KETOROLAC TROMETHAMINE 30 MG/ML IJ SOLN
30.0000 mg | Freq: Once | INTRAMUSCULAR | Status: AC
  Administered 2020-04-20: 30 mg via INTRAMUSCULAR

## 2020-04-20 MED ORDER — CYCLOBENZAPRINE HCL 5 MG PO TABS: 5.0000 mg | ORAL_TABLET | Freq: Three times a day (TID) | ORAL | 0 refills | Status: DC | PRN

## 2020-04-20 MED ORDER — PREDNISONE 10 MG (21) PO TBPK: ORAL_TABLET | ORAL | 0 refills | Status: DC

## 2020-04-20 MED ORDER — KETOROLAC TROMETHAMINE 30 MG/ML IJ SOLN
INTRAMUSCULAR | Status: AC
  Filled 2020-04-20: qty 1

## 2020-04-20 NOTE — ED Triage Notes (Signed)
Pt denies any CP

## 2020-04-20 NOTE — Discharge Instructions (Addendum)
Your EKG was normal I believe this is a pinched nerve and some muscle spasm.  Treating with prednisone taper over the next 6 days. Take with food.  Flexeril as needed for muscle relaxant.  Rest, alternate heat and ice.  Toradol given here for pain.  Follow up as needed for continued or worsening symptoms

## 2020-04-20 NOTE — ED Triage Notes (Signed)
PT reports lt neck and Arm pain started yesterday while at work. Pt left work at 1430 due to pain. Pt reports she took a muscle relaxer . Pt rep[orts she does not do any haevy lifting at work.

## 2020-04-21 NOTE — ED Provider Notes (Signed)
[facility]    CSN: [id number] Arrival date & time: 04/20/20  1522      History   Chief Complaint Chief Complaint  Patient presents with  . Neck Pain  . Arm Pain    HPI Wendy James is a 49 y.o. female.   Patient is a 49 year old female past medical history of arthritis, asthma, allergies.  She presents today with left-sided neck pain, left trapezius pain with radiation down the left arm and hand.  There is associated numbness and tingling.  Denies any decreased range in the arm.  Denies any chest pain, shortness of breath, dizziness, speech difficulties, weakness in lower extremities.  Does a lot of upper body impediments at work with her job.  Left work today due to the pain unable to perform her job duties.  Took a muscle relaxer she have leftover with no relief.     Past Medical History:  Diagnosis Date  . 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 Asthma   . Bone spur of foot   . Seasonal allergies     Patient Active Problem List   Diagnosis Date Noted  . Abnormal serum thyroid stimulating hormone (TSH) level 12/14/2019  . Vitamin D insufficiency 12/14/2019  . Asthma exacerbation 04/08/2019  . Acute otitis media 12/04/2018  . Acute ear pain, left 12/04/2018  . Heel spur, left 06/11/2018  . Plantar fasciitis, left 03/11/2018  . Influenza A with respiratory manifestations 09/06/2016  . Prediabetes 05/08/2015  . Essential hypertension 05/08/2015  . Mild intermittent asthma 05/06/2015  . Leukocytosis, steroid induced  04/29/2015  . Steroid-induced hyperglycemia 04/29/2015  . Accelerated hypertension 04/28/2015  . Anemia of chroic diseae, IDA 11/17/2013  . Bronchitis, acute 04/12/2012  . Acute asthma exacerbation 03/12/2012  . Acute respiratory failure (HCC) 03/12/2012    Past Surgical History:  Procedure Laterality Date  . CESAREAN SECTION     x 4  . CHOLECYSTECTOMY    . HEEL SPUR EXCISION     08/2017  . HYSTEROSCOPY WITH D & C N/A 02/18/2015    Procedure: DILATATION AND CURETTAGE /HYSTEROSCOPY;  Surgeon: Allie Bossier, MD;  Location: [facility];  Service: Gynecology;  Laterality: N/A;  . TUBAL LIGATION      OB History    Gravida  5   Para  4   Term  4   Preterm  0   AB  1   Living  3     SAB  1   TAB  0   Ectopic  0   Multiple  0   Live Births               Home Medications    Prior to Admission medications   Medication Sig Start Date End Date Taking? Authorizing Provider  albuterol (VENTOLIN HFA) 108 (90 Base) MCG/ACT inhaler Inhale 2 puffs into the lungs every 6 (six) hours as needed for wheezing or shortness of breath. 03/04/20  Yes Barbette Merino, NP  amLODipine (NORVASC) 10 MG tablet Take 1 tablet (10 mg total) by mouth daily. 02/09/20  Yes Barbette Merino, NP  levocetirizine (XYZAL) 5 MG tablet Take 1 tablet (5 mg total) by mouth every evening. 03/04/20 06/02/20 Yes King, Shana Chute, NP  zolpidem (AMBIEN) 10 MG tablet TAKE 1 TABLET(10 MG) BY MOUTH AT BEDTIME AS NEEDED FOR SLEEP 02/15/20  Yes Barbette Merino, NP  cyclobenzaprine (FLEXERIL) 5 MG tablet Take 1 tablet (5 mg total) by mouth 3 (  three) times daily as needed for muscle spasms. 04/20/20   Dahlia Byes A, NP  ibuprofen (ADVIL) 800 MG tablet Take 1 tablet (800 mg total) by mouth every 8 (eight) hours as needed. 01/12/20   Vivi Barrack, DPM  phentermine 37.5 MG capsule Take 1 capsule (37.5 mg total) by mouth daily. 02/09/20 03/10/20  Barbette Merino, NP  predniSONE (STERAPRED UNI-PAK 21 TAB) 10 MG (21) TBPK tablet 6 tabs for 1 day, then 5 tabs for 1 das, then 4 tabs for 1 day, then 3 tabs for 1 day, 2 tabs for 1 day, then 1 tab for 1 day 04/20/20   Janace Aris, NP  Spacer/Aero-Holding Chambers (AEROCHAMBER Z-STAT PLUS/MEDIUM) inhaler Use as instructed 04/10/19   Pearson Grippe, MD  cetirizine (ZYRTEC) 10 MG tablet Take 1 tablet (10 mg total) by mouth daily. 03/17/19 04/20/20  Massie Maroon, FNP    Family History Family History  Problem Relation Age of Onset    . Hypertension Mother   . Stroke Sister   . Seizures Sister     Social History Social History   Tobacco Use  . Smoking status: Former Smoker    Packs/day: 0.75    Types: Cigarettes    Quit date: 11/17/1988    Years since quitting: 31.4  . Smokeless tobacco: Never Used  Vaping Use  . Vaping Use: Never used  Substance Use Topics  . Alcohol use: No  . Drug use: No     Allergies   Mobic [meloxicam] and Shrimp [shellfish allergy]   Review of Systems Review of Systems   Physical Exam Triage Vital Signs ED Triage Vitals  Enc Vitals Group     BP 04/20/20 1533 (!) 163/73     Pulse Rate 04/20/20 1533 67     Resp 04/20/20 1533 18     Temp 04/20/20 1533 98.9 F (37.2 C)     Temp Source 04/20/20 1533 Oral     SpO2 04/20/20 1533 97 %     Weight 04/20/20 1536 205 lb (93 kg)     Height 04/20/20 1536 5\' 2"  (1.575 m)     Head Circumference --      Peak Flow --      Pain Score 04/20/20 1534 10     Pain Loc --      Pain Edu? --      Excl. in GC? --    No data found.  Updated Vital Signs BP (!) 163/73 (BP Location: Right Arm)   Pulse 67   Temp 98.9 F (37.2 C) (Oral)   Resp 18   Ht 5\' 2"  (1.575 m)   Wt 205 lb (93 kg)   SpO2 97%   BMI 37.49 kg/m   Visual Acuity Right Eye Distance:   Left Eye Distance:   Bilateral Distance:    Right Eye Near:   Left Eye Near:    Bilateral Near:     Physical Exam Vitals and nursing note reviewed.  Constitutional:      General: She is not in acute distress.    Appearance: Normal appearance. She is not ill-appearing, toxic-appearing or diaphoretic.  HENT:     Head: Normocephalic.     Nose: Nose normal.     Mouth/Throat:     Pharynx: Oropharynx is clear.  Eyes:     Conjunctiva/sclera: Conjunctivae normal.  Pulmonary:     Effort: Pulmonary effort is normal.  Musculoskeletal:        General: Normal range of  motion.     Cervical back: Normal range of motion. No swelling, spasms, tenderness or bony tenderness. No pain with  movement. Normal range of motion.       Back:     Comments: TTP Mild swelling to the left hand.  Grip strength equal.  Skin:    General: Skin is warm and dry.     Findings: No rash.  Neurological:     Mental Status: She is alert.  Psychiatric:        Mood and Affect: Mood normal.      UC Treatments / Results  Labs (all labs ordered are listed, but only abnormal results are displayed) Labs Reviewed - No data to display  EKG   Radiology No results found.  Procedures Procedures (including critical care time)  Medications Ordered in UC Medications  ketorolac (TORADOL) 30 MG/ML injection 30 mg (30 mg Intramuscular Given 04/20/20 1644)    Initial Impression / Assessment and Plan / UC Course  I have reviewed the triage vital signs and the nursing notes.  Pertinent labs & imaging results that were available during my care of the patient were reviewed by me and considered in my medical decision making (see chart for details).     Radiculopathy Did EKG today in clinic based on symptoms.  Normal sinus rhythm with normal rate.  No ST elevation or depression.  No concern for ACS at this time. Believe this is a pinched nerve with some muscle spasming.  We will do prednisone taper over the next 6 days.  Take with food.  Flexeril as needed for muscle relaxant.  Rest, alternate heat and ice.  Toradol given here for pain. Follow up as needed for continued or worsening symptoms  Final Clinical Impressions(s) / UC Diagnoses   Final diagnoses:  Cervical radiculopathy     Discharge Instructions     Your EKG was normal I believe this is a pinched nerve and some muscle spasm.  Treating with prednisone taper over the next 6 days. Take with food.  Flexeril as needed for muscle relaxant.  Rest, alternate heat and ice.  Toradol given here for pain.  Follow up as needed for continued or worsening symptoms     ED Prescriptions    Medication Sig Dispense Auth. Provider    predniSONE (STERAPRED UNI-PAK 21 TAB) 10 MG (21) TBPK tablet 6 tabs for 1 day, then 5 tabs for 1 das, then 4 tabs for 1 day, then 3 tabs for 1 day, 2 tabs for 1 day, then 1 tab for 1 day 21 tablet Jackqulyn Mendel A, NP   cyclobenzaprine (FLEXERIL) 5 MG tablet Take 1 tablet (5 mg total) by mouth 3 (three) times daily as needed for muscle spasms. 30 tablet Dahlia Byes A, NP     PDMP not reviewed this encounter.   Dahlia Byes A, NP 04/21/20 1320

## 2020-04-26 ENCOUNTER — Other Ambulatory Visit (INDEPENDENT_AMBULATORY_CARE_PROVIDER_SITE_OTHER): Payer: BC Managed Care – PPO

## 2020-04-26 DIAGNOSIS — I10 Essential (primary) hypertension: Secondary | ICD-10-CM

## 2020-04-26 DIAGNOSIS — Z0000 Encounter for general adult medical examination without abnormal findings: Secondary | ICD-10-CM | POA: Diagnosis not present

## 2020-04-26 DIAGNOSIS — Z6841 Body Mass Index (BMI) 40.0 and over, adult: Secondary | ICD-10-CM | POA: Diagnosis not present

## 2020-04-26 LAB — LIPID PANEL
Cholesterol: 150 mg/dL (ref 0–200)
HDL: 53.6 mg/dL (ref >39.00)
LDL Cholesterol: 84 mg/dL (ref 0–99)
NonHDL: 96.79
Total CHOL/HDL Ratio: 3
Triglycerides: 63 mg/dL (ref 0.0–149.0)
VLDL: 12.6 mg/dL (ref 0.0–40.0)

## 2020-04-26 LAB — BASIC METABOLIC PANEL
BUN: 13 mg/dL (ref 6–23)
CO2: 27 mEq/L (ref 19–32)
Calcium: 9.1 mg/dL (ref 8.4–10.5)
Chloride: 104 mEq/L (ref 96–112)
Creatinine, Ser: 0.61 mg/dL (ref 0.40–1.20)
GFR: 124.88 mL/min (ref >60.00)
Glucose, Bld: 110 mg/dL — ABNORMAL HIGH (ref 70–99)
Potassium: 3.6 mEq/L (ref 3.5–5.1)
Sodium: 137 mEq/L (ref 135–145)

## 2020-04-26 LAB — HEPATIC FUNCTION PANEL
ALT: 37 U/L — ABNORMAL HIGH (ref 0–35)
AST: 29 U/L (ref 0–37)
Albumin: 4.1 g/dL (ref 3.5–5.2)
Alkaline Phosphatase: 71 U/L (ref 39–117)
Bilirubin, Direct: 0.2 mg/dL (ref 0.0–0.3)
Total Bilirubin: 1.4 mg/dL — ABNORMAL HIGH (ref 0.2–1.2)
Total Protein: 7.3 g/dL (ref 6.0–8.3)

## 2020-04-26 NOTE — Addendum Note (Signed)
Addended by: Yaslene Lindamood C on: 04/26/2020 10:02 AM   Modules accepted: Orders  

## 2020-04-26 NOTE — Addendum Note (Signed)
Addended by: Darryl Willner C on: 04/26/2020 10:02 AM   Modules accepted: Orders  

## 2020-04-26 NOTE — Addendum Note (Signed)
Addended by: Karen Kays C on: 04/26/2020 10:02 AM   Modules accepted: Orders

## 2020-05-04 ENCOUNTER — Other Ambulatory Visit: Payer: Self-pay | Admitting: Nurse Practitioner

## 2020-05-06 ENCOUNTER — Ambulatory Visit (AMBULATORY_SURGERY_CENTER): Payer: Self-pay | Admitting: *Deleted

## 2020-05-06 ENCOUNTER — Other Ambulatory Visit: Payer: Self-pay

## 2020-05-06 VITALS — Ht 62.0 in | Wt 208.0 lb

## 2020-05-06 DIAGNOSIS — Z1211 Encounter for screening for malignant neoplasm of colon: Secondary | ICD-10-CM

## 2020-05-06 MED ORDER — SUTAB 1479-225-188 MG PO TABS: 24.0000 | ORAL_TABLET | ORAL | 0 refills | Status: DC

## 2020-05-06 NOTE — Progress Notes (Signed)

## 2020-05-09 ENCOUNTER — Ambulatory Visit (AMBULATORY_SURGERY_CENTER): Payer: BC Managed Care – PPO | Admitting: Internal Medicine

## 2020-05-09 ENCOUNTER — Encounter: Payer: Self-pay | Admitting: Internal Medicine

## 2020-05-09 ENCOUNTER — Other Ambulatory Visit: Payer: Self-pay

## 2020-05-09 VITALS — BP 123/83 | HR 61 | Temp 97.3°F | Resp 16 | Ht 62.0 in | Wt 208.0 lb

## 2020-05-09 DIAGNOSIS — Z1211 Encounter for screening for malignant neoplasm of colon: Secondary | ICD-10-CM

## 2020-05-09 MED ORDER — SODIUM CHLORIDE 0.9 % IV SOLN: 500.0000 mL | Freq: Once | INTRAVENOUS | Status: DC

## 2020-05-09 NOTE — Patient Instructions (Signed)
YOU HAD AN ENDOSCOPIC PROCEDURE TODAY AT THE [facility]:   Refer to the procedure report that was given to you for any specific questions about what was found during the examination.  If the procedure report does not answer your questions, please call your gastroenterologist to clarify.  If you requested that your care partner not be given the details of your procedure findings, then the procedure report has been included in a sealed envelope for you to review at your convenience later.  YOU SHOULD EXPECT: Some feelings of bloating in the abdomen. Passage of more gas than usual.  Walking can help get rid of the air that was put into your GI tract during the procedure and reduce the bloating. If you had a lower endoscopy (such as a colonoscopy or flexible sigmoidoscopy) you may notice spotting of blood in your stool or on the toilet paper. If you underwent a bowel prep for your procedure, you may not have a normal bowel movement for a few days.  Please Note:  You might notice some irritation and congestion in your nose or some drainage.  This is from the oxygen used during your procedure.  There is no need for concern and it should clear up in a day or so.  SYMPTOMS TO REPORT IMMEDIATELY:   Following lower endoscopy (colonoscopy or flexible sigmoidoscopy):  Excessive amounts of blood in the stool  Significant tenderness or worsening of abdominal pains  Swelling of the abdomen that is new, acute  Fever of 100F or higher  For urgent or emergent issues, a gastroenterologist can be reached at any hour by calling [phone]. Do not use MyChart messaging for urgent concerns.    DIET:  We do recommend a small meal at first, but then you may proceed to your regular diet.  Drink plenty of fluids but you should avoid alcoholic beverages for 24 hours.  ACTIVITY:  You should plan to take it easy for the rest of today and you should NOT DRIVE or use heavy machinery until tomorrow (because  of the sedation medicines used during the test).    FOLLOW UP: Our staff will call the number listed on your records 48-72 hours following your procedure to check on you and address any questions or concerns that you may have regarding the information given to you following your procedure. If we do not reach you, we will leave a message.  We will attempt to reach you two times.  During this call, we will ask if you have developed any symptoms of COVID 19. If you develop any symptoms (ie: fever, flu-like symptoms, shortness of breath, cough etc.) before then, please call [phone].  If you test positive for Covid 19 in the 2 weeks post procedure, please call and report this information to us.    If any biopsies were taken you will be contacted by phone or by letter within the next 1-3 weeks.  Please call us at [phone] if you have not heard about the biopsies in 3 weeks.    SIGNATURES/CONFIDENTIALITY: You and/or your care partner have signed paperwork which will be entered into your electronic medical record.  These signatures attest to the fact that that the information above on your After Visit Summary has been reviewed and is understood.  Full responsibility of the confidentiality of this discharge information lies with you and/or your care-partner. 

## 2020-05-09 NOTE — Progress Notes (Signed)
Report to PACU, RN, vss, BBS= Clear.  

## 2020-05-09 NOTE — Progress Notes (Signed)
Pt's states no medical or surgical changes since previsit or office visit. 

## 2020-05-09 NOTE — Op Note (Signed)
[facility] Patient Name: Wendy James Procedure Date: 05/09/2020 10:44 AM MRN: [id number] Endoscopist: Wilhemina Bonito. Marina Goodell , MD Age: 49 Referring MD:  Date of Birth: 1970/08/16 Gender: Female Account #: [id number] Procedure:                Colonoscopy Indications:              Screening for colorectal malignant neoplasm Medicines:                Monitored Anesthesia Care Procedure:                Pre-Anesthesia Assessment:                           - Prior to the procedure, a History and Physical                            was performed, and patient medications and                            allergies were reviewed. The patient's tolerance of                            previous anesthesia was also reviewed. The risks                            and benefits of the procedure and the sedation                            options and risks were discussed with the patient.                            All questions were answered, and informed consent                            was obtained. Prior Anticoagulants: The patient has                            taken no previous anticoagulant or antiplatelet                            agents. ASA Grade Assessment: II - A patient with                            mild systemic disease. After reviewing the risks                            and benefits, the patient was deemed in                            satisfactory condition to undergo the procedure.                           After obtaining informed consent, the colonoscope  was passed under direct vision. Throughout the                            procedure, the patient's blood pressure, pulse, and                            oxygen saturations were monitored continuously. The                            Colonoscope was introduced through the anus and                            advanced to the the cecum, identified by                            appendiceal orifice and  ileocecal valve. The                            ileocecal valve, appendiceal orifice, and rectum                            were photographed. The quality of the bowel                            preparation was excellent. The colonoscopy was                            performed without difficulty. The patient tolerated                            the procedure well. The bowel preparation used was                            SUPREP via split dose instruction. Scope In: 11:00:38 AM Scope Out: 11:13:01 AM Scope Withdrawal Time: 0 hours 9 minutes 46 seconds  Total Procedure Duration: 0 hours 12 minutes 23 seconds  Findings:                 The entire examined colon appeared normal on direct                            and retroflexion views. Complications:            No immediate complications. Estimated blood loss:                            None. Estimated Blood Loss:     Estimated blood loss: none. Impression:               - The entire examined colon is normal on direct and                            retroflexion views.                           - No specimens collected.  Recommendation:           - Repeat colonoscopy in 10 years for screening                            purposes.                           - Patient has a contact number available for                            emergencies. The signs and symptoms of potential                            delayed complications were discussed with the                            patient. Return to normal activities tomorrow.                            Written discharge instructions were provided to the                            patient.                           - Resume previous diet.                           - Continue present medications. Wilhemina Bonito. Marina Goodell, MD 05/09/2020 11:17:33 AM This report has been signed electronically.

## 2020-05-11 ENCOUNTER — Telehealth: Payer: Self-pay | Admitting: *Deleted

## 2020-05-11 NOTE — Telephone Encounter (Signed)
Attempted f/u phone call. No answer. Mailbox full, unable to leave message.  

## 2020-05-11 NOTE — Telephone Encounter (Signed)
Second follow up call made, left message. 

## 2020-05-26 ENCOUNTER — Other Ambulatory Visit: Payer: Self-pay | Admitting: Nurse Practitioner

## 2020-05-26 DIAGNOSIS — G4700 Insomnia, unspecified: Secondary | ICD-10-CM

## 2020-05-27 NOTE — Telephone Encounter (Signed)
Please review  Thank you

## 2020-05-30 ENCOUNTER — Telehealth: Payer: Self-pay | Admitting: Nurse Practitioner

## 2020-05-30 ENCOUNTER — Other Ambulatory Visit: Payer: Self-pay | Admitting: Nurse Practitioner

## 2020-05-30 DIAGNOSIS — G4700 Insomnia, unspecified: Secondary | ICD-10-CM

## 2020-05-30 MED ORDER — ZOLPIDEM TARTRATE 10 MG PO TABS: ORAL_TABLET | ORAL | 0 refills | Status: DC

## 2020-05-30 NOTE — Telephone Encounter (Signed)
Sent!

## 2020-06-03 ENCOUNTER — Ambulatory Visit: Payer: BC Managed Care – PPO | Admitting: Nurse Practitioner

## 2020-06-17 ENCOUNTER — Telehealth: Payer: Self-pay | Admitting: Nurse Practitioner

## 2020-06-17 ENCOUNTER — Other Ambulatory Visit: Payer: Self-pay | Admitting: Nurse Practitioner

## 2020-06-17 DIAGNOSIS — J4520 Mild intermittent asthma, uncomplicated: Secondary | ICD-10-CM

## 2020-06-19 ENCOUNTER — Other Ambulatory Visit: Payer: Self-pay | Admitting: Nurse Practitioner

## 2020-06-19 MED ORDER — ALBUTEROL SULFATE HFA 108 (90 BASE) MCG/ACT IN AERS: 2.0000 | INHALATION_SPRAY | Freq: Four times a day (QID) | RESPIRATORY_TRACT | 11 refills | Status: DC | PRN

## 2020-06-19 NOTE — Telephone Encounter (Signed)
sent 

## 2020-06-20 ENCOUNTER — Ambulatory Visit (INDEPENDENT_AMBULATORY_CARE_PROVIDER_SITE_OTHER): Payer: BC Managed Care – PPO | Admitting: Nurse Practitioner

## 2020-06-20 ENCOUNTER — Encounter: Payer: Self-pay | Admitting: Nurse Practitioner

## 2020-06-20 ENCOUNTER — Other Ambulatory Visit: Payer: Self-pay

## 2020-06-20 VITALS — Wt 207.0 lb

## 2020-06-20 DIAGNOSIS — N951 Menopausal and female climacteric states: Secondary | ICD-10-CM

## 2020-06-20 DIAGNOSIS — Z23 Encounter for immunization: Secondary | ICD-10-CM

## 2020-06-20 DIAGNOSIS — J4520 Mild intermittent asthma, uncomplicated: Secondary | ICD-10-CM

## 2020-06-20 DIAGNOSIS — E669 Obesity, unspecified: Secondary | ICD-10-CM

## 2020-06-20 DIAGNOSIS — G4700 Insomnia, unspecified: Secondary | ICD-10-CM | POA: Diagnosis not present

## 2020-06-20 DIAGNOSIS — I10 Essential (primary) hypertension: Secondary | ICD-10-CM

## 2020-06-20 DIAGNOSIS — Z6841 Body Mass Index (BMI) 40.0 and over, adult: Secondary | ICD-10-CM

## 2020-06-20 MED ORDER — PHENTERMINE HCL 37.5 MG PO CAPS: 37.5000 mg | ORAL_CAPSULE | Freq: Every day | ORAL | 0 refills | Status: DC

## 2020-06-20 NOTE — Progress Notes (Signed)
[facility] [address] [place], [place]  [address] Phone:  [phone]   Fax:  [phone]   Established Patient Office Visit  Subjective:  Patient ID: Wendy MeringLynette James, female    DOB: 01/20/1971  Age: 49 y.o. MRN: [id number]  CC:  Chief Complaint  Patient presents with  . Follow-up    HPI Wendy MeringLynette Deschamps presents for follow up. She  has a past medical history of Allergy, Arthritis, Asthma, Bone spur of foot, Hypertension, and Seasonal allergies.   She admits that she was having some increased discomfort. She has used her Duoneb on 3 occasions since yesterday. She denies any chest pain. Denies headache, dizziness, visual changes, nausea, vomiting or any edema.  She has discontinued the use of her allergy medication. She admits that she has been feeling better. She has been eating healthy; more vegetables including raw broccoli and cauliflower. She has not been on the phentermine because her last dose was denied by me due to 3 mon use. She continues to work long hours. She is looking forward to resting this weekend.   She has been having increased problem with hot flashes. She has had to cut her hair because she just can not tolerate the heat. She also contribute some changes in her hair to the use of braid extension. She denies any depression. However she does have insomnia. This has been ongoing however has gotten worse with the menopausal symptoms. She has tried to wean off the Ambien for 3 days however this was unsuccessful. She was unable to use soy to help with the menopausal symptoms. She admits that she wants to treat these symptoms naturally.   Past Medical History:  Diagnosis Date  . Allergy   . 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 Asthma   . Bone spur of foot   . Hypertension    controlled   . Seasonal allergies     Past Surgical History:  Procedure Laterality Date  . CESAREAN SECTION     x 4  . CHOLECYSTECTOMY    . HEEL SPUR EXCISION      08/2017  . HYSTEROSCOPY WITH D & C N/A 02/18/2015   Procedure: DILATATION AND CURETTAGE /HYSTEROSCOPY;  Surgeon: Allie BossierMyra C Dove, MD;  Location: [facility];  Service: Gynecology;  Laterality: N/A;  . TUBAL LIGATION      Family History  Problem Relation Age of Onset  . Hypertension Mother   . Stroke Sister   . Seizures Sister   . Colon cancer Maternal Aunt   . Stomach cancer Maternal Uncle   . Colon cancer Maternal Grandmother   . Colon polyps Neg Hx   . Esophageal cancer Neg Hx   . Rectal cancer Neg Hx     Social History   Socioeconomic History  . Marital status: Married    Spouse name: Not on file  . Number of children: Not on file  . Years of education: Not on file  . Highest education level: Not on file  Occupational History  . Not on file  Tobacco Use  . Smoking status: Former Smoker    Packs/day: 0.75    Types: Cigarettes    Quit date: 11/17/1988    Years since quitting: 31.6  . Smokeless tobacco: Never Used  Vaping Use  . Vaping Use: Never used  Substance and Sexual Activity  . Alcohol use: No  . Drug use: No  . Sexual activity: Yes    Birth  control/protection: Surgical  Other Topics Concern  . Not on file  Social History Narrative  . Not on file   Social Determinants of Health   Financial Resource Strain:   . Difficulty of Paying Living Expenses: Not on file  Food Insecurity:   . Worried About Programme researcher, broadcasting/film/video in the Last Year: Not on file  . Ran Out of Food in the Last Year: Not on file  Transportation Needs:   . Lack of Transportation (Medical): Not on file  . Lack of Transportation (Non-Medical): Not on file  Physical Activity:   . Days of Exercise per Week: Not on file  . Minutes of Exercise per Session: Not on file  Stress:   . Feeling of Stress : Not on file  Social Connections:   . Frequency of Communication with Friends and Family: Not on file  . Frequency of Social Gatherings with Friends and Family: Not on file  . Attends Religious Services:  Not on file  . Active Member of Clubs or Organizations: Not on file  . Attends Banker Meetings: Not on file  . Marital Status: Not on file  Intimate Partner Violence:   . Fear of Current or Ex-Partner: Not on file  . Emotionally Abused: Not on file  . Physically Abused: Not on file  . Sexually Abused: Not on file    Outpatient Medications Prior to Visit  Medication Sig Dispense Refill  . albuterol (VENTOLIN HFA) 108 (90 Base) MCG/ACT inhaler Inhale 2 puffs into the lungs every 6 (six) hours as needed for wheezing or shortness of breath. 8 g 11  . amLODipine (NORVASC) 10 MG tablet Take 1 tablet (10 mg total) by mouth daily. 90 tablet 3  . cyclobenzaprine (FLEXERIL) 5 MG tablet Take 1 tablet (5 mg total) by mouth 3 (three) times daily as needed for muscle spasms. (Patient not taking: Reported on 06/21/2020) 30 tablet 0  . ipratropium-albuterol (DUONEB) 0.5-2.5 (3) MG/3ML SOLN Inhale 3 mLs into the lungs every 6 (six) hours as needed (SOB).     Marland Kitchen Spacer/Aero-Holding Chambers (AEROCHAMBER Z-STAT PLUS/MEDIUM) inhaler Use as instructed 1 each 2  . zolpidem (AMBIEN) 10 MG tablet TAKE 1 TABLET(10 MG) BY MOUTH AT BEDTIME AS NEEDED FOR SLEEP (Patient taking differently: Take 10 mg by mouth at bedtime as needed for sleep. TAKE 1 TABLET(10 MG) BY MOUTH AT BEDTIME AS NEEDED FOR SLEEP) 90 tablet 0  . albuterol (VENTOLIN HFA) 108 (90 Base) MCG/ACT inhaler Inhale 2 puffs into the lungs every 6 (six) hours as needed for wheezing or shortness of breath. 6.7 g 3  . amLODipine (NORVASC) 5 MG tablet Take 5 mg by mouth daily.    Marland Kitchen levocetirizine (XYZAL) 5 MG tablet Take 1 tablet (5 mg total) by mouth every evening. 30 tablet 2   No facility-administered medications prior to visit.    Allergies  Allergen Reactions  . Mobic [Meloxicam] Hives    Pt reports she gets real bad hives from Mobic  . Shrimp [Shellfish Allergy] Other (See Comments)    Wheezing.  Patient states she is ok with betadine     ROS Review of Systems  Respiratory: Positive for chest tightness.       Objective:    Physical Exam HENT:     Head: Normocephalic and atraumatic.     Right Ear: Tympanic membrane normal.     Left Ear: Tympanic membrane normal.  Cardiovascular:     Rate and Rhythm: Normal rate and regular  rhythm.     Pulses: Normal pulses.     Heart sounds: Normal heart sounds.  Pulmonary:     Effort: Pulmonary effort is normal.     Comments: Diminished Abdominal:     General: Bowel sounds are normal.     Palpations: Abdomen is soft.  Musculoskeletal:        General: Normal range of motion.     Cervical back: Normal range of motion.  Skin:    General: Skin is warm and dry.     Capillary Refill: Capillary refill takes less than 2 seconds.  Neurological:     General: No focal deficit present.     Mental Status: She is alert and oriented to person, place, and time.  Psychiatric:        Mood and Affect: Mood normal.        Behavior: Behavior normal.        Thought Content: Thought content normal.        Judgment: Judgment normal.     Wt 207 lb (93.9 kg)   BMI 37.86 kg/m  Wt Readings from Last 3 Encounters:  06/21/20 227 lb 1.2 oz (103 kg)  06/20/20 207 lb (93.9 kg)  05/09/20 208 lb (94.3 kg)     Health Maintenance Due  Topic Date Due  . MAMMOGRAM  11/04/2018    There are no preventive care reminders to display for this patient.  Lab Results  Component Value Date   TSH 0.347 (L) 12/10/2019   Lab Results  Component Value Date   WBC 4.6 06/21/2020   HGB 13.3 06/21/2020   HCT 40.7 06/21/2020   MCV 91.7 06/21/2020   PLT 229 06/21/2020   Lab Results  Component Value Date   NA 137 04/26/2020   K 3.6 04/26/2020   CO2 27 04/26/2020   GLUCOSE 110 (H) 04/26/2020   BUN 13 04/26/2020   CREATININE 0.61 04/26/2020   BILITOT 1.4 (H) 04/26/2020   ALKPHOS 71 04/26/2020   AST 29 04/26/2020   ALT 37 (H) 04/26/2020   PROT 7.3 04/26/2020   ALBUMIN 4.1 04/26/2020   CALCIUM  9.1 04/26/2020   ANIONGAP 9 04/10/2019   GFR 124.88 04/26/2020   Lab Results  Component Value Date   CHOL 150 04/26/2020   Lab Results  Component Value Date   HDL 53.60 04/26/2020   Lab Results  Component Value Date   LDLCALC 84 04/26/2020   Lab Results  Component Value Date   TRIG 63.0 04/26/2020   Lab Results  Component Value Date   CHOLHDL 3 04/26/2020   Lab Results  Component Value Date   HGBA1C 5.6 03/17/2019      Assessment & Plan:   Problem List Items Addressed This Visit      Cardiovascular and Mediastinum   Essential hypertension Stable: Encouraged on going compliance with current medication regimen Encouraged home monitoring and recording BP <130/80 Continue eating a heart-healthy diet with less salt Encouraged regular physical activity  Continue with weight loss     Other Visit Diagnoses    Mild intermittent asthma, uncomplicated    -  Primary Continue with current regimen.  Chest xray declined feeling better after burping   Insomnia, unspecified type     Continue Ambien for now    Flu vaccine need       Morbid obesity with BMI of 40.0-44.9, adult (HCC)   Encourage Beano with raw vegetables     Phentermine 37.5 mg daily x30 days Continue lifestyle  modification   Relevant Medications   phentermine 37.5 MG capsule   Menopausal hot flushes     Declined supplements at this time Continue to treat naturally      Meds ordered this encounter  Medications  . phentermine 37.5 MG capsule    Sig: Take 1 capsule (37.5 mg total) by mouth daily.    Dispense:  30 capsule    Refill:  0    Order Specific Question:   Supervising Provider    Answer:   Quentin Angst [[id number]]    Follow-up: Return in about 3 months (around 09/20/2020).    Barbette Merino, NP

## 2020-06-20 NOTE — Patient Instructions (Signed)
Menopause Menopause is the normal time of life when menstrual periods stop completely. It is usually confirmed by 12 months without a menstrual period. The transition to menopause (perimenopause) most often happens between the ages of 45 and 55. During perimenopause, hormone levels change in your body, which can cause symptoms and affect your health. Menopause may increase your risk for:  Loss of bone (osteoporosis), which causes bone breaks (fractures).  Depression.  Hardening and narrowing of the arteries (atherosclerosis), which can cause heart attacks and strokes. What are the causes? This condition is usually caused by a natural change in hormone levels that happens as you get older. The condition may also be caused by surgery to remove both ovaries (bilateral oophorectomy). What increases the risk? This condition is more likely to start at an earlier age if you have certain medical conditions or treatments, including:  A tumor of the pituitary gland in the brain.  A disease that affects the ovaries and hormone production.  Radiation treatment for cancer.  Certain cancer treatments, such as chemotherapy or hormone (anti-estrogen) therapy.  Heavy smoking and excessive alcohol use.  Family history of early menopause. This condition is also more likely to develop earlier in women who are very thin. What are the signs or symptoms? Symptoms of this condition include:  Hot flashes.  Irregular menstrual periods.  Night sweats.  Changes in feelings about sex. This could be a decrease in sex drive or an increased comfort around your sexuality.  Vaginal dryness and thinning of the vaginal walls. This may cause painful intercourse.  Dryness of the skin and development of wrinkles.  Headaches.  Problems sleeping (insomnia).  Mood swings or irritability.  Memory problems.  Weight gain.  Hair growth on the face and chest.  Bladder infections or problems with urinating. How  is this diagnosed? This condition is diagnosed based on your medical history, a physical exam, your age, your menstrual history, and your symptoms. Hormone tests may also be done. How is this treated? In some cases, no treatment is needed. You and your health care provider should make a decision together about whether treatment is necessary. Treatment will be based on your individual condition and preferences. Treatment for this condition focuses on managing symptoms. Treatment may include:  Menopausal hormone therapy (MHT).  Medicines to treat specific symptoms or complications.  Acupuncture.  Vitamin or herbal supplements. Before starting treatment, make sure to let your health care provider know if you have a personal or family history of:  Heart disease.  Breast cancer.  Blood clots.  Diabetes.  Osteoporosis. Follow these instructions at home: Lifestyle  Do not use any products that contain nicotine or tobacco, such as cigarettes and e-cigarettes. If you need help quitting, ask your health care provider.  Get at least 30 minutes of physical activity on 5 or more days each week.  Avoid alcoholic and caffeinated beverages, as well as spicy foods. This may help prevent hot flashes.  Get 7-8 hours of sleep each night.  If you have hot flashes, try: ? Dressing in layers. ? Avoiding things that may trigger hot flashes, such as spicy food, warm places, or stress. ? Taking slow, deep breaths when a hot flash starts. ? Keeping a fan in your home and office.  Find ways to manage stress, such as deep breathing, meditation, or journaling.  Consider going to group therapy with other women who are having menopause symptoms. Ask your health care provider about recommended group therapy meetings. Eating and   drinking  Eat a healthy, balanced diet that contains whole grains, lean protein, low-fat dairy, and plenty of fruits and vegetables.  Your health care provider may recommend  adding more soy to your diet. Foods that contain soy include tofu, tempeh, and soy milk.  Eat plenty of foods that contain calcium and vitamin D for bone health. Items that are rich in calcium include low-fat milk, yogurt, beans, almonds, sardines, broccoli, and kale. Medicines  Take over-the-counter and prescription medicines only as told by your health care provider.  Talk with your health care provider before starting any herbal supplements. If prescribed, take vitamins and supplements as told by your health care provider. These may include: ? Calcium. Women age 51 and older should get 1,200 mg (milligrams) of calcium every day. ? Vitamin D. Women need 600-800 International Units of vitamin D each day. ? Vitamins B12 and B6. Aim for 50 micrograms of B12 and 1.5 mg of B6 each day. General instructions  Keep track of your menstrual periods, including: ? When they occur. ? How heavy they are and how long they last. ? How much time passes between periods.  Keep track of your symptoms, noting when they start, how often you have them, and how long they last.  Use vaginal lubricants or moisturizers to help with vaginal dryness and improve comfort during sex.  Keep all follow-up visits as told by your health care provider. This is important. This includes any group therapy or counseling. Contact a health care provider if:  You are still having menstrual periods after age 55.  You have pain during sex.  You have not had a period for 12 months and you develop vaginal bleeding. Get help right away if:  You have: ? Severe depression. ? Excessive vaginal bleeding. ? Pain when you urinate. ? A fast or irregular heart beat (palpitations). ? Severe headaches. ? Abdomen (abdominal) pain or severe indigestion.  You fell and you think you have a broken bone.  You develop leg or chest pain.  You develop vision problems.  You feel a lump in your breast. Summary  Menopause is the normal  time of life when menstrual periods stop completely. It is usually confirmed by 12 months without a menstrual period.  The transition to menopause (perimenopause) most often happens between the ages of 45 and 55.  Symptoms can be managed through medicines, lifestyle changes, and complementary therapies such as acupuncture.  Eat a balanced diet that is rich in nutrients to promote bone health and heart health and to manage symptoms during menopause. This information is not intended to replace advice given to you by your health care provider. Make sure you discuss any questions you have with your health care provider. Document Revised: 06/28/2017 Document Reviewed: 08/18/2016 Elsevier Patient Education  2020 [employer]  

## 2020-06-20 NOTE — Telephone Encounter (Signed)
Please see patient medication request.

## 2020-06-21 ENCOUNTER — Encounter (HOSPITAL_COMMUNITY): Payer: Self-pay | Admitting: Emergency Medicine

## 2020-06-21 ENCOUNTER — Other Ambulatory Visit: Payer: Self-pay

## 2020-06-21 ENCOUNTER — Emergency Department (HOSPITAL_COMMUNITY): Payer: BC Managed Care – PPO

## 2020-06-21 ENCOUNTER — Emergency Department (HOSPITAL_COMMUNITY)
Admission: EM | Admit: 2020-06-21 | Discharge: 2020-06-21 | Disposition: A | Discharge status: Home / Self Care | Payer: BC Managed Care – PPO | Attending: Emergency Medicine | Admitting: Emergency Medicine

## 2020-06-21 DIAGNOSIS — J45909 Unspecified asthma, uncomplicated: Secondary | ICD-10-CM | POA: Diagnosis not present

## 2020-06-21 DIAGNOSIS — I10 Essential (primary) hypertension: Secondary | ICD-10-CM | POA: Diagnosis not present

## 2020-06-21 DIAGNOSIS — J4541 Moderate persistent asthma with (acute) exacerbation: Secondary | ICD-10-CM | POA: Diagnosis not present

## 2020-06-21 DIAGNOSIS — Z87891 Personal history of nicotine dependence: Secondary | ICD-10-CM | POA: Insufficient documentation

## 2020-06-21 DIAGNOSIS — Z79899 Other long term (current) drug therapy: Secondary | ICD-10-CM | POA: Diagnosis not present

## 2020-06-21 DIAGNOSIS — R079 Chest pain, unspecified: Secondary | ICD-10-CM | POA: Diagnosis present

## 2020-06-21 DIAGNOSIS — R0789 Other chest pain: Secondary | ICD-10-CM

## 2020-06-21 LAB — I-STAT BETA HCG BLOOD, ED (MC, WL, AP ONLY): I-stat hCG, quantitative: <5 m[IU]/mL (ref <5)

## 2020-06-21 LAB — BASIC METABOLIC PANEL
Anion gap: 11 (ref 5–15)
BUN: 12 mg/dL (ref 6–20)
CO2: 24 mmol/L (ref 22–32)
Calcium: 9 mg/dL (ref 8.9–10.3)
Chloride: 104 mmol/L (ref 98–111)
Creatinine, Ser: 0.66 mg/dL (ref 0.44–1.00)
GFR, Estimated: >60 mL/min (ref >60)
Glucose, Bld: 125 mg/dL — ABNORMAL HIGH (ref 70–99)
Potassium: 3.6 mmol/L (ref 3.5–5.1)
Sodium: 139 mmol/L (ref 135–145)

## 2020-06-21 LAB — CBC
HCT: 40.7 % (ref 36.0–46.0)
Hemoglobin: 13.3 g/dL (ref 12.0–15.0)
MCH: 30 pg (ref 26.0–34.0)
MCHC: 32.7 g/dL (ref 30.0–36.0)
MCV: 91.7 fL (ref 80.0–100.0)
Platelets: 229 10*3/uL (ref 150–400)
RBC: 4.44 MIL/uL (ref 3.87–5.11)
RDW: 12.4 % (ref 11.5–15.5)
WBC: 4.6 10*3/uL (ref 4.0–10.5)
nRBC: 0 % (ref 0.0–0.2)

## 2020-06-21 LAB — TROPONIN I (HIGH SENSITIVITY): Troponin I (High Sensitivity): 4 ng/L (ref <18)

## 2020-06-21 MED ORDER — IPRATROPIUM-ALBUTEROL 0.5-2.5 (3) MG/3ML IN SOLN
3.0000 mL | Freq: Once | RESPIRATORY_TRACT | Status: AC
  Administered 2020-06-21: 3 mL via RESPIRATORY_TRACT
  Filled 2020-06-21: qty 3

## 2020-06-21 MED ORDER — BENZONATATE 100 MG PO CAPS: 100.0000 mg | ORAL_CAPSULE | Freq: Three times a day (TID) | ORAL | 0 refills | Status: DC

## 2020-06-21 MED ORDER — ACETAMINOPHEN 325 MG PO TABS
650.0000 mg | ORAL_TABLET | Freq: Once | ORAL | Status: AC
  Administered 2020-06-21: 650 mg via ORAL
  Filled 2020-06-21: qty 2

## 2020-06-21 MED ORDER — DEXAMETHASONE SODIUM PHOSPHATE 10 MG/ML IJ SOLN
10.0000 mg | Freq: Once | INTRAMUSCULAR | Status: AC
  Administered 2020-06-21: 10 mg via INTRAMUSCULAR
  Filled 2020-06-21: qty 1

## 2020-06-21 NOTE — Discharge Instructions (Signed)
Take the medications along with Tylenol as needed to help with your symptoms. Follow-up with your primary care provider and asthma specialist. Return to the ER if you start to experience worsening chest pain, shortness of breath or wheezing, leg swelling.

## 2020-06-21 NOTE — ED Triage Notes (Signed)
Patient reports central chest pain with productive cough/wheezing and SOB onset last week , no emesis or diaphoresis . Denies fever or chills .

## 2020-06-21 NOTE — ED Provider Notes (Signed)
[facility] Provider Note   CSN: [id number] Arrival date & time: 06/21/20  [id number]     History Chief Complaint  Patient presents with   Chest Pain    Wendy James is a 49 y.o. female with a past medical history of hypertension, asthma presenting to the ED with a chief complaint of productive cough, right-sided chest pain and wheezing as well as shortness of breath for the past 2 days.  Reports intermittent right-sided lower chest pain that is worse with coughing and movements for the past 2 days.  Yesterday started noticing that she was having wheezing and shortness of breath and was concerned this was a flareup of her asthma.  She did see her pulmonologist yesterday but got worse after the appointment at night.  She tried her nebulizer treatment without significant improvement in her symptoms.  Denies any sick contacts with similar symptoms.  No fever, abdominal pain, nausea, vomiting, leg swelling.  Patient has been fully vaccinated against Covid including her booster.  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   Asthma    Bone spur of foot    Hypertension    controlled    Seasonal allergies     Patient Active Problem List   Diagnosis Date Noted   Abnormal serum thyroid stimulating hormone (TSH) level 12/14/2019   Vitamin D insufficiency 12/14/2019   Asthma exacerbation 04/08/2019   Acute otitis media 12/04/2018   Acute ear pain, left 12/04/2018   Heel spur, left 06/11/2018   Plantar fasciitis, left 03/11/2018   Influenza A with respiratory manifestations 09/06/2016   Prediabetes 05/08/2015   Essential hypertension 05/08/2015   Mild intermittent asthma 05/06/2015   Leukocytosis, steroid induced  04/29/2015   Steroid-induced hyperglycemia 04/29/2015   Accelerated hypertension 04/28/2015   Anemia of chroic diseae, IDA 11/17/2013   Bronchitis, acute 04/12/2012     Acute asthma exacerbation 03/12/2012   Acute respiratory failure (HCC) 03/12/2012    Past Surgical History:  Procedure Laterality Date   CESAREAN SECTION     x 4   CHOLECYSTECTOMY     HEEL SPUR EXCISION     08/2017   HYSTEROSCOPY WITH D & C N/A 02/18/2015   Procedure: DILATATION AND CURETTAGE /HYSTEROSCOPY;  Surgeon: Allie BossierMyra C Dove, MD;  Location: [facility];  Service: Gynecology;  Laterality: N/A;   TUBAL LIGATION       OB History    Gravida  5   Para  4   Term  4   Preterm  0   AB  1   Living  3     SAB  1   TAB  0   Ectopic  0   Multiple  0   Live Births              Family History  Problem Relation Age of Onset   Hypertension Mother    Stroke Sister    Seizures Sister    Colon cancer Maternal Aunt    Stomach cancer Maternal Uncle    Colon cancer Maternal Grandmother    Colon polyps Neg Hx    Esophageal cancer Neg Hx    Rectal cancer Neg Hx     Social History   Tobacco Use   Smoking status: Former Smoker    Packs/day: 0.75    Types: Cigarettes    Quit date: 11/17/1988  Years since quitting: 31.6   Smokeless tobacco: Never Used  Vaping Use   Vaping Use: Never used  Substance Use Topics   Alcohol use: No   Drug use: No    Home Medications Prior to Admission medications   Medication Sig Start Date End Date Taking? Authorizing Provider  albuterol (VENTOLIN HFA) 108 (90 Base) MCG/ACT inhaler Inhale 2 puffs into the lungs every 6 (six) hours as needed for wheezing or shortness of breath. 06/19/20  Yes Barbette Merino, NP  amLODipine (NORVASC) 10 MG tablet Take 1 tablet (10 mg total) by mouth daily. 02/09/20  Yes King, Shana Chute, NP  ipratropium-albuterol (DUONEB) 0.5-2.5 (3) MG/3ML SOLN Inhale 3 mLs into the lungs every 6 (six) hours as needed (SOB).  04/16/20  Yes [provider]  Multiple Vitamins-Minerals (MULTIVITAMIN WITH MINERALS) tablet Take 1 tablet by mouth daily.   Yes [provider]  zolpidem  (AMBIEN) 10 MG tablet TAKE 1 TABLET(10 MG) BY MOUTH AT BEDTIME AS NEEDED FOR SLEEP Patient taking differently: Take 10 mg by mouth at bedtime as needed for sleep. TAKE 1 TABLET(10 MG) BY MOUTH AT BEDTIME AS NEEDED FOR SLEEP 05/30/20  Yes Barbette Merino, NP  benzonatate (TESSALON) 100 MG capsule Take 1 capsule (100 mg total) by mouth every 8 (eight) hours. 06/21/20   Zurisadai Helminiak, PA-C  cyclobenzaprine (FLEXERIL) 5 MG tablet Take 1 tablet (5 mg total) by mouth 3 (three) times daily as needed for muscle spasms. Patient not taking: Reported on 06/21/2020 04/20/20   Dahlia Byes A, NP  phentermine 37.5 MG capsule Take 1 capsule (37.5 mg total) by mouth daily. 06/20/20 07/20/20  Barbette Merino, NP  Spacer/Aero-Holding Chambers (AEROCHAMBER Z-STAT PLUS/MEDIUM) inhaler Use as instructed 04/10/19   Pearson Grippe, MD  cetirizine (ZYRTEC) 10 MG tablet Take 1 tablet (10 mg total) by mouth daily. 03/17/19 04/20/20  Massie Maroon, FNP    Allergies    Mobic [meloxicam] and Shrimp [shellfish allergy]  Review of Systems   Review of Systems  Constitutional: Negative for appetite change, chills and fever.  HENT: Negative for ear pain, rhinorrhea, sneezing and sore throat.   Eyes: Negative for photophobia and visual disturbance.  Respiratory: Positive for cough, chest tightness, shortness of breath and wheezing.   Cardiovascular: Positive for chest pain. Negative for palpitations.  Gastrointestinal: Negative for abdominal pain, blood in stool, constipation, diarrhea, nausea and vomiting.  Genitourinary: Negative for dysuria, hematuria and urgency.  Musculoskeletal: Negative for myalgias.  Skin: Negative for rash.  Neurological: Negative for dizziness, weakness and light-headedness.    Physical Exam Updated Vital Signs BP (!) 152/109    Pulse 80    Temp 98 F (36.7 C) (Oral)    Resp (!) 21    Ht 5\' 3"  (1.6 m)    Wt 103 kg    SpO2 98%    BMI 40.22 kg/m   Physical Exam Vitals and nursing note reviewed.    Constitutional:      General: She is not in acute distress.    Appearance: She is well-developed. She is not diaphoretic.  HENT:     Head: Normocephalic and atraumatic.     Nose: Nose normal.  Eyes:     General: No scleral icterus.       Left eye: No discharge.     Conjunctiva/sclera: Conjunctivae normal.  Cardiovascular:     Rate and Rhythm: Normal rate and regular rhythm.     Heart sounds: Normal heart sounds. No murmur  heard.  No friction rub. No gallop.   Pulmonary:     Effort: Pulmonary effort is normal. No respiratory distress.     Breath sounds: Wheezing (Faint end expiratory wheezing in bilateral lung fields) present.  Chest:     Chest wall: Tenderness present.    Abdominal:     General: Bowel sounds are normal. There is no distension.     Palpations: Abdomen is soft.     Tenderness: There is no abdominal tenderness. There is no guarding.  Musculoskeletal:        General: Normal range of motion.     Cervical back: Normal range of motion and neck supple.  Skin:    General: Skin is warm and dry.     Findings: No rash.  Neurological:     Mental Status: She is alert.     Motor: No abnormal muscle tone.     Coordination: Coordination normal.     ED Results / Procedures / Treatments   Labs (all labs ordered are listed, but only abnormal results are displayed) Labs Reviewed  BASIC METABOLIC PANEL - Abnormal; Notable for the following components:      Result Value   Glucose, Bld 125 (*)    All other components within normal limits  CBC  I-STAT BETA HCG BLOOD, ED ([facility], [facility], [facility] ONLY)  TROPONIN I (HIGH SENSITIVITY)    EKG EKG Interpretation  Date/Time:  Tuesday June 21 2020 06:14:35 EST Ventricular Rate:  83 PR Interval:  148 QRS Duration: 76 QT Interval:  336 QTC Calculation: 394 R Axis:   -68 Text Interpretation: Normal sinus rhythm Left axis deviation Anterolateral infarct , age undetermined no change from previous Confirmed by Arby Barrette [phone])  on 06/21/2020 7:52:56 AM   Radiology DG Chest 2 View  Result Date: 06/21/2020 CLINICAL DATA:  Chest pain. Productive cough. Wheezing. Shortness of breath. EXAM: CHEST - 2 VIEW COMPARISON:  Chest x-ray 08/28/2019. FINDINGS: Mediastinum and hilar structures normal. Low lung volumes. Mild peribronchial cuffing. Bronchitis could present this fashion. No focal alveolar infiltrate. No pleural effusion or pneumothorax. Degenerative change thoracic spine. Surgical clips right upper quadrant. IMPRESSION: Low lung volumes. Mild peribronchial cuffing, bronchitis could present this fashion. Electronically Signed   By: Maisie Fus  Register   On: 06/21/2020 06:35    Procedures Procedures (including critical care time)  Medications Ordered in ED Medications  dexamethasone (DECADRON) injection 10 mg (has no administration in time range)  ipratropium-albuterol (DUONEB) 0.5-2.5 (3) MG/3ML nebulizer solution 3 mL (3 mLs Nebulization Given 06/21/20 0657)  acetaminophen (TYLENOL) tablet 650 mg (650 mg Oral Given 06/21/20 5284)    ED Course  I have reviewed the triage vital signs and the nursing notes.  Pertinent labs & imaging results that were available during my care of the patient were reviewed by me and considered in my medical decision making (see chart for details).  Clinical Course as of Jun 22 799  Tue Jun 21, 2020  1324 Patient with some improvement in her symptoms after DuoNeb given.   [HK]    Clinical Course User Index [HK] Dietrich Pates, PA-C   MDM Rules/Calculators/A&P                          49 year old female with a past medical history of hypertension and asthma presenting to the ED for chest pain, wheezing, shortness of breath and cough which she relates to an asthma exacerbation.  Symptoms began 2 days ago but  got worse last night despite using her home nebulizer treatment.  She states that this feels typical of her asthma exacerbations.  She reports right-sided lower chest pain that is  worse with movement and coughing.  Reports a sharp pain in this area with certain movements.  On exam chest is tender to palpation.  She has faint expiratory expiratory wheezing in bilateral lung fields.  She is speaking complete sentences without signs of respiratory distress.  Oxygen saturations 99% on room air.  Will obtain lab work, chest x-ray and reassess.  EKG shows no ischemic changes.  Patient declining steroids as she states that "they make me blow up."  Chest x-ray shows findings with possible bronchitis.  Lab work including CBC, BMP and troponin negative.  I doubt ACS as a cause of her symptoms as her chest pain is reproducible and worse with movements.  Because she reports similar symptoms with prior exacerbations, will treat for the same.  Have patient reconsider steroids as I told her these will help with this acute exacerbation.  She is requesting Decadron instead of prednisone.  I have ordered this.  Oxygen saturations remained above 96% on room air.  Do not feel that antibiotics are indicated at this time as there is no evidence of pneumonia.  Patient is comfortable with antitussives and continue her home nebulizer treatments and inhaler use. Doubt PE as she is PERC negative, low risk by HEART score.  Will have her follow-up with her PCP, pulmonologist and return for worsening symptoms.  All imaging, if done today, including plain films, CT scans, and ultrasounds, independently reviewed by me, and interpretations confirmed via formal radiology reads.  Patient is hemodynamically stable, in NAD, and able to ambulate in the ED. Evaluation does not show pathology that would require ongoing emergent intervention or inpatient treatment. I explained the diagnosis to the patient. Pain has been managed and has no complaints prior to discharge. Patient is comfortable with above plan and is stable for discharge at this time. All questions were answered prior to disposition. Strict return precautions for  returning to the ED were discussed. Encouraged follow up with PCP.   An After Visit Summary was printed and given to the patient.   Portions of this note were generated with Scientist, clinical (histocompatibility and immunogenetics). Dictation errors may occur despite best attempts at proofreading.  Final Clinical Impression(s) / ED Diagnoses Final diagnoses:  Chest wall pain  Moderate persistent asthma with exacerbation    Rx / DC Orders ED Discharge Orders         Ordered    benzonatate (TESSALON) 100 MG capsule  Every 8 hours        06/21/20 0757           Dietrich Pates, PA-C 06/21/20 0800    Arby Barrette, MD 06/21/20 [phone]

## 2020-06-27 ENCOUNTER — Other Ambulatory Visit: Payer: Self-pay | Admitting: Family Medicine

## 2020-07-03 ENCOUNTER — Other Ambulatory Visit: Payer: Self-pay | Admitting: Nurse Practitioner

## 2020-07-06 NOTE — Telephone Encounter (Signed)
Please see refill request.

## 2020-09-04 ENCOUNTER — Other Ambulatory Visit: Payer: Self-pay | Admitting: Nurse Practitioner

## 2020-09-10 ENCOUNTER — Other Ambulatory Visit: Payer: Self-pay | Admitting: Nurse Practitioner

## 2020-09-10 DIAGNOSIS — G4700 Insomnia, unspecified: Secondary | ICD-10-CM

## 2020-09-19 ENCOUNTER — Telehealth: Payer: Self-pay | Admitting: Nurse Practitioner

## 2020-09-19 ENCOUNTER — Ambulatory Visit: Payer: BC Managed Care – PPO | Admitting: Nurse Practitioner

## 2020-09-19 NOTE — Telephone Encounter (Signed)
Pt was call VM was full could not leave a message

## 2020-09-26 ENCOUNTER — Ambulatory Visit (INDEPENDENT_AMBULATORY_CARE_PROVIDER_SITE_OTHER): Payer: BC Managed Care – PPO | Admitting: Nurse Practitioner

## 2020-09-26 ENCOUNTER — Other Ambulatory Visit: Payer: Self-pay

## 2020-09-26 ENCOUNTER — Encounter: Payer: Self-pay | Admitting: Nurse Practitioner

## 2020-09-26 VITALS — BP 142/80 | HR 79 | Temp 98.4°F | Ht 63.0 in | Wt 205.0 lb

## 2020-09-26 DIAGNOSIS — I10 Essential (primary) hypertension: Secondary | ICD-10-CM

## 2020-09-26 DIAGNOSIS — G4700 Insomnia, unspecified: Secondary | ICD-10-CM

## 2020-09-26 DIAGNOSIS — R7989 Other specified abnormal findings of blood chemistry: Secondary | ICD-10-CM

## 2020-09-26 DIAGNOSIS — F419 Anxiety disorder, unspecified: Secondary | ICD-10-CM | POA: Diagnosis not present

## 2020-09-26 MED ORDER — AMITRIPTYLINE HCL 25 MG PO TABS: 25.0000 mg | ORAL_TABLET | Freq: Every day | ORAL | 3 refills | Status: DC

## 2020-09-26 NOTE — Patient Instructions (Addendum)
Managing Your Hypertension Hypertension, also called high blood pressure, is when the force of the blood pressing against the walls of the arteries is too strong. Arteries are blood vessels that carry blood from your heart throughout your body. Hypertension forces the heart to work harder to pump blood and may cause the arteries to become narrow or stiff. Understanding blood pressure readings Your personal target blood pressure may vary depending on your medical conditions, your age, and other factors. A blood pressure reading includes a higher number over a lower number. Ideally, your blood pressure should be below 120/80. You should know that:  The first, or top, number is called the systolic pressure. It is a measure of the pressure in your arteries as your heart beats.  The second, or bottom number, is called the diastolic pressure. It is a measure of the pressure in your arteries as the heart relaxes. Blood pressure is classified into four stages. Based on your blood pressure reading, your health care provider may use the following stages to determine what type of treatment you need, if any. Systolic pressure and diastolic pressure are measured in a unit called mmHg. Normal  Systolic pressure: below 120.  Diastolic pressure: below 80. Elevated  Systolic pressure: 120-129.  Diastolic pressure: below 80. Hypertension stage 1  Systolic pressure: 130-139.  Diastolic pressure: 80-89. Hypertension stage 2  Systolic pressure: 140 or above.  Diastolic pressure: 90 or above. How can this condition affect me? Managing your hypertension is an important responsibility. Over time, hypertension can damage the arteries and decrease blood flow to important parts of the body, including the brain, heart, and kidneys. Having untreated or uncontrolled hypertension can lead to:  A heart attack.  A stroke.  A weakened blood vessel (aneurysm).  Heart failure.  Kidney damage.  Eye  damage.  Metabolic syndrome.  Memory and concentration problems.  Vascular dementia. What actions can I take to manage this condition? Hypertension can be managed by making lifestyle changes and possibly by taking medicines. Your health care provider will help you make a plan to bring your blood pressure within a normal range. Nutrition  Eat a diet that is high in fiber and potassium, and low in salt (sodium), added sugar, and fat. An example eating plan is called the Dietary Approaches to Stop Hypertension (DASH) diet. To eat this way: ? Eat plenty of fresh fruits and vegetables. Try to fill one-half of your plate at each meal with fruits and vegetables. ? Eat whole grains, such as whole-wheat pasta, brown rice, or whole-grain bread. Fill about one-fourth of your plate with whole grains. ? Eat low-fat dairy products. ? Avoid fatty cuts of meat, processed or cured meats, and poultry with skin. Fill about one-fourth of your plate with lean proteins such as fish, chicken without skin, beans, eggs, and tofu. ? Avoid pre-made and processed foods. These tend to be higher in sodium, added sugar, and fat.  Reduce your daily sodium intake. Most people with hypertension should eat less than 1,500 mg of sodium a day.   Lifestyle  Work with your health care provider to maintain a healthy body weight or to lose weight. Ask what an ideal weight is for you.  Get at least 30 minutes of exercise that causes your heart to beat faster (aerobic exercise) most days of the week. Activities may include walking, swimming, or biking.  Include exercise to strengthen your muscles (resistance exercise), such as weight lifting, as part of your weekly exercise routine. Try   to do these types of exercises for 30 minutes at least 3 days a week.  Do not use any products that contain nicotine or tobacco, such as cigarettes, e-cigarettes, and chewing tobacco. If you need help quitting, ask your health care  provider.  Control any long-term (chronic) conditions you have, such as high cholesterol or diabetes.  Identify your sources of stress and find ways to manage stress. This may include meditation, deep breathing, or making time for fun activities.   Alcohol use  Do not drink alcohol if: ? Your health care provider tells you not to drink. ? You are pregnant, may be pregnant, or are planning to become pregnant.  If you drink alcohol: ? Limit how much you use to:  0-1 drink a day for women.  0-2 drinks a day for men. ? Be aware of how much alcohol is in your drink. In the [place] one drink equals one 12 oz bottle of beer (355 mL), one 5 oz glass of wine (148 mL), or one 1 oz glass of hard liquor (44 mL). Medicines Your health care provider may prescribe medicine if lifestyle changes are not enough to get your blood pressure under control and if:  Your systolic blood pressure is 130 or higher.  Your diastolic blood pressure is 80 or higher. Take medicines only as told by your health care provider. Follow the directions carefully. Blood pressure medicines must be taken as told by your health care provider. The medicine does not work as well when you skip doses. Skipping doses also puts you at risk for problems. Monitoring Before you monitor your blood pressure:  Do not smoke, drink caffeinated beverages, or exercise within 30 minutes before taking a measurement.  Use the bathroom and empty your bladder (urinate).  Sit quietly for at least 5 minutes before taking measurements. Monitor your blood pressure at home as told by your health care provider. To do this:  Sit with your back straight and supported.  Place your feet flat on the floor. Do not cross your legs.  Support your arm on a flat surface, such as a table. Make sure your upper arm is at heart level.  Each time you measure, take two or three readings one minute apart and record the results. You may also need to have your  blood pressure checked regularly by your health care provider.   General information  Talk with your health care provider about your diet, exercise habits, and other lifestyle factors that may be contributing to hypertension.  Review all the medicines you take with your health care provider because there may be side effects or interactions.  Keep all visits as told by your health care provider. Your health care provider can help you create and adjust your plan for managing your high blood pressure. Where to find more information  National Heart, Lung, and Blood Institute: www.nhlbi.nih.gov  American Heart Association: www.heart.org Contact a health care provider if:  You think you are having a reaction to medicines you have taken.  You have repeated (recurrent) headaches.  You feel dizzy.  You have swelling in your ankles.  You have trouble with your vision. Get help right away if:  You develop a severe headache or confusion.  You have unusual weakness or numbness, or you feel faint.  You have severe pain in your chest or abdomen.  You vomit repeatedly.  You have trouble breathing. These symptoms may represent a serious problem that is an emergency. Do not wait   to see if the symptoms will go away. Get medical help right away. Call your local emergency services (911 in the [place] Do not drive yourself to the hospital. Summary  Hypertension is when the force of blood pumping through your arteries is too strong. If this condition is not controlled, it may put you at risk for serious complications.  Your personal target blood pressure may vary depending on your medical conditions, your age, and other factors. For most people, a normal blood pressure is less than 120/80.  Hypertension is managed by lifestyle changes, medicines, or both.  Lifestyle changes to help manage hypertension include losing weight, eating a healthy, low-sodium diet, exercising more, stopping smoking, and  limiting alcohol. This information is not intended to replace advice given to you by your health care provider. Make sure you discuss any questions you have with your health care provider. Document Revised: 08/21/2019 Document Reviewed: 06/16/2019 Elsevier Patient Education  2021 [employer] Inc.   Managing Anxiety, Adult After being diagnosed with an anxiety disorder, you may be relieved to know why you have felt or behaved a certain way. You may also feel overwhelmed about the treatment ahead and what it will mean for your life. With care and support, you can manage this condition and recover from it. How to manage lifestyle changes Managing stress and anxiety Stress is your body's reaction to life changes and events, both good and bad. Most stress will last just a few hours, but stress can be ongoing and can lead to more than just stress. Although stress can play a major role in anxiety, it is not the same as anxiety. Stress is usually caused by something external, such as a deadline, test, or competition. Stress normally passes after the triggering event has ended.  Anxiety is caused by something internal, such as imagining a terrible outcome or worrying that something will go wrong that will devastate you. Anxiety often does not go away even after the triggering event is over, and it can become long-term (chronic) worry. It is important to understand the differences between stress and anxiety and to manage your stress effectively so that it does not lead to an anxious response. Talk with your health care provider or a counselor to learn more about reducing anxiety and stress. He or she may suggest tension reduction techniques, such as:  Music therapy. This can include creating or listening to music that you enjoy and that inspires you.  Mindfulness-based meditation. This involves being aware of your normal breaths while not trying to control your breathing. It can be done while sitting or  walking.  Centering prayer. This involves focusing on a word, phrase, or sacred image that means something to you and brings you peace.  Deep breathing. To do this, expand your stomach and inhale slowly through your nose. Hold your breath for 3-5 seconds. Then exhale slowly, letting your stomach muscles relax.  Self-talk. This involves identifying thought patterns that lead to anxiety reactions and changing those patterns.  Muscle relaxation. This involves tensing muscles and then relaxing them. Choose a tension reduction technique that suits your lifestyle and personality. These techniques take time and practice. Set aside 5-15 minutes a day to do them. Therapists can offer counseling and training in these techniques. The training to help with anxiety may be covered by some insurance plans. Other things you can do to manage stress and anxiety include:  Keeping a stress/anxiety diary. This can help you learn what triggers your reaction and then  learn ways to manage your response.  Thinking about how you react to certain situations. You may not be able to control everything, but you can control your response.  Making time for activities that help you relax and not feeling guilty about spending your time in this way.  Visual imagery and yoga can help you stay calm and relax.   Medicines Medicines can help ease symptoms. Medicines for anxiety include:  Anti-anxiety drugs.  Antidepressants. Medicines are often used as a primary treatment for anxiety disorder. Medicines will be prescribed by a health care provider. When used together, medicines, psychotherapy, and tension reduction techniques may be the most effective treatment. Relationships Relationships can play a big part in helping you recover. Try to spend more time connecting with trusted friends and family members. Consider going to couples counseling, taking family education classes, or going to family therapy. Therapy can help you and  others better understand your condition. How to recognize changes in your anxiety Everyone responds differently to treatment for anxiety. Recovery from anxiety happens when symptoms decrease and stop interfering with your daily activities at home or work. This may mean that you will start to:  Have better concentration and focus. Worry will interfere less in your daily thinking.  Sleep better.  Be less irritable.  Have more energy.  Have improved memory. It is important to recognize when your condition is getting worse. Contact your health care provider if your symptoms interfere with home or work and you feel like your condition is not improving. Follow these instructions at home: Activity  Exercise. Most adults should do the following: ? Exercise for at least 150 minutes each week. The exercise should increase your heart rate and make you sweat (moderate-intensity exercise). ? Strengthening exercises at least twice a week.  Get the right amount and quality of sleep. Most adults need 7-9 hours of sleep each night. Lifestyle  Eat a healthy diet that includes plenty of vegetables, fruits, whole grains, low-fat dairy products, and lean protein. Do not eat a lot of foods that are high in solid fats, added sugars, or salt.  Make choices that simplify your life.  Do not use any products that contain nicotine or tobacco, such as cigarettes, e-cigarettes, and chewing tobacco. If you need help quitting, ask your health care provider.  Avoid caffeine, alcohol, and certain over-the-counter cold medicines. These may make you feel worse. Ask your pharmacist which medicines to avoid.   General instructions  Take over-the-counter and prescription medicines only as told by your health care provider.  Keep all follow-up visits as told by your health care provider. This is important. Where to find support You can get help and support from these sources:  Self-help groups.  Online and  [employer].  A trusted spiritual leader.  Couples counseling.  Family education classes.  Family therapy. Where to find more information You may find that joining a support group helps you deal with your anxiety. The following sources can help you locate counselors or support groups near you:  Mental Health America: www.mentalhealthamerica.net  Anxiety and Depression Association of [place] (ADAA): ProgramCam.de  [employer] Mental [employer]ness (NAMI): www.nami.org Contact a health care provider if you:  Have a hard time staying focused or finishing daily tasks.  Spend many hours a day feeling worried about everyday life.  Become exhausted by worry.  Start to have headaches, feel tense, or have nausea.  Urinate more than normal.  Have diarrhea. Get help right away if  you have:  A racing heart and shortness of breath.  Thoughts of hurting yourself or others. If you ever feel like you may hurt yourself or others, or have thoughts about taking your own life, get help right away. You can go to your nearest emergency department or call:  Your local emergency services (911 in the [place]  A suicide crisis helpline, such as the National Suicide Prevention Lifeline at (314) 692-5739. This is open 24 hours a day. Summary  Taking steps to learn and use tension reduction techniques can help calm you and help prevent triggering an anxiety reaction.  When used together, medicines, psychotherapy, and tension reduction techniques may be the most effective treatment.  Family, friends, and partners can play a big part in helping you recover from an anxiety disorder. This information is not intended to replace advice given to you by your health care provider. Make sure you discuss any questions you have with your health care provider. Document Revised: 12/16/2018 Document Reviewed: 12/16/2018 Elsevier Patient Education  2021 Elsevier Inc.    Insomnia Insomnia is a  sleep disorder that makes it difficult to fall asleep or stay asleep. Insomnia can cause fatigue, low energy, difficulty concentrating, mood swings, and poor performance at work or school. There are three different ways to classify insomnia:  Difficulty falling asleep.  Difficulty staying asleep.  Waking up too early in the morning. Any type of insomnia can be long-term (chronic) or short-term (acute). Both are common. Short-term insomnia usually lasts for three months or less. Chronic insomnia occurs at least three times a week for longer than three months. What are the causes? Insomnia may be caused by another condition, situation, or substance, such as:  Anxiety.  Certain medicines.  Gastroesophageal reflux disease (GERD) or other gastrointestinal conditions.  Asthma or other breathing conditions.  Restless legs syndrome, sleep apnea, or other sleep disorders.  Chronic pain.  Menopause.  Stroke.  Abuse of alcohol, tobacco, or illegal drugs.  Mental health conditions, such as depression.  Caffeine.  Neurological disorders, such as Alzheimer's disease.  An overactive thyroid (hyperthyroidism). Sometimes, the cause of insomnia may not be known. What increases the risk? Risk factors for insomnia include:  Gender. Women are affected more often than men.  Age. Insomnia is more common as you get older.  Stress.  Lack of exercise.  Irregular work schedule or working night shifts.  Traveling between different time zones.  Certain medical and mental health conditions. What are the signs or symptoms? If you have insomnia, the main symptom is having trouble falling asleep or having trouble staying asleep. This may lead to other symptoms, such as:  Feeling fatigued or having low energy.  Feeling nervous about going to sleep.  Not feeling rested in the morning.  Having trouble concentrating.  Feeling irritable, anxious, or depressed. How is this diagnosed? This  condition may be diagnosed based on:  Your symptoms and medical history. Your health care provider may ask about: ? Your sleep habits. ? Any medical conditions you have. ? Your mental health.  A physical exam. How is this treated? Treatment for insomnia depends on the cause. Treatment may focus on treating an underlying condition that is causing insomnia. Treatment may also include:  Medicines to help you sleep.  Counseling or therapy.  Lifestyle adjustments to help you sleep better. Follow these instructions at home: Eating and drinking  Limit or avoid alcohol, caffeinated beverages, and cigarettes, especially close to bedtime. These can disrupt your sleep.  Do  not eat a large meal or eat spicy foods right before bedtime. This can lead to digestive discomfort that can make it hard for you to sleep.   Sleep habits  Keep a sleep diary to help you and your health care provider figure out what could be causing your insomnia. Write down: ? When you sleep. ? When you wake up during the night. ? How well you sleep. ? How rested you feel the next day. ? Any side effects of medicines you are taking. ? What you eat and drink.  Make your bedroom a dark, comfortable place where it is easy to fall asleep. ? Put up shades or blackout curtains to block light from outside. ? Use a white noise machine to block noise. ? Keep the temperature cool.  Limit screen use before bedtime. This includes: ? Watching TV. ? Using your smartphone, tablet, or computer.  Stick to a routine that includes going to bed and waking up at the same times every day and night. This can help you fall asleep faster. Consider making a quiet activity, such as reading, part of your nighttime routine.  Try to avoid taking naps during the day so that you sleep better at night.  Get out of bed if you are still awake after 15 minutes of trying to sleep. Keep the lights down, but try reading or doing a quiet activity. When  you feel sleepy, go back to bed.   General instructions  Take over-the-counter and prescription medicines only as told by your health care provider.  Exercise regularly, as told by your health care provider. Avoid exercise starting several hours before bedtime.  Use relaxation techniques to manage stress. Ask your health care provider to suggest some techniques that may work well for you. These may include: ? Breathing exercises. ? Routines to release muscle tension. ? Visualizing peaceful scenes.  Make sure that you drive carefully. Avoid driving if you feel very sleepy.  Keep all follow-up visits as told by your health care provider. This is important. Contact a health care provider if:  You are tired throughout the day.  You have trouble in your daily routine due to sleepiness.  You continue to have sleep problems, or your sleep problems get worse. Get help right away if:  You have serious thoughts about hurting yourself or someone else. If you ever feel like you may hurt yourself or others, or have thoughts about taking your own life, get help right away. You can go to your nearest emergency department or call:  Your local emergency services (911 in the [place]  A suicide crisis helpline, such as the National Suicide Prevention Lifeline at 424 066 58171-224-169-1774. This is open 24 hours a day. Summary  Insomnia is a sleep disorder that makes it difficult to fall asleep or stay asleep.  Insomnia can be long-term (chronic) or short-term (acute).  Treatment for insomnia depends on the cause. Treatment may focus on treating an underlying condition that is causing insomnia.  Keep a sleep diary to help you and your health care provider figure out what could be causing your insomnia. This information is not intended to replace advice given to you by your health care provider. Make sure you discuss any questions you have with your health care provider. Document Revised: 05/26/2020 Document  Reviewed: 05/26/2020 Elsevier Patient Education  2021 [employer]

## 2020-09-26 NOTE — Progress Notes (Signed)
[facility] [address] [place], [place]  [address] Phone:  [phone]   Fax:  [phone]   Established Patient Office Visit  Subjective:  Patient ID: Wendy James, female    DOB: 1970-08-01  Age: 50 y.o. MRN: [id number]  CC:  Chief Complaint  Patient presents with  . Follow-up    3 month follow , discuss refill on medication     HPI Wendy James presents for follow up. She  has a past medical history of Allergy, Arthritis, Asthma, Bone spur of foot, Hypertension, and Seasonal allergies.    Wendy James is a 50 y.o. female who complains of insomnia. Onset was several years ago. Patient describes symptoms as difficulty falling asleep. Patient has found minimal relief with avoiding heavy meal before bedtime, avoiding long naps during the day, avoiding vigorous physical exercise prior to bed, going to sleep at the same time each night and ambien. Associated symptoms include: anxiety. Patient denies daytime somnolence, depression, frequent nighttime urination, leg cramps, restless legs, snoring and stress. Symptoms have gradually worsened. She is not able to lay down sometimes. She gets a boost of energy. She feels like it maybe related to anxiety. She does not feel like she can cut her mind off. She admits that her cycle came on for 4 days. She has tired to wean off her Ambien. She has been on for several years. She was in the bed at 8 and was not able to sleep until midnight.    She continues with lifestyle modifications. She admits that she has changed jobs. This is a good move.   Past Medical History:  Diagnosis Date  . Allergy   . 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 Asthma   . Bone spur of foot   . Hypertension    controlled   . Seasonal allergies     Past Surgical History:  Procedure Laterality Date  . CESAREAN SECTION     x 4  . CHOLECYSTECTOMY    . HEEL SPUR EXCISION     08/2017  . HYSTEROSCOPY WITH D & C N/A 02/18/2015   Procedure:  DILATATION AND CURETTAGE /HYSTEROSCOPY;  Surgeon: Allie Bossier, MD;  Location: [facility];  Service: Gynecology;  Laterality: N/A;  . TUBAL LIGATION      Family History  Problem Relation Age of Onset  . Hypertension Mother   . Stroke Sister   . Seizures Sister   . Colon cancer Maternal Aunt   . Stomach cancer Maternal Uncle   . Colon cancer Maternal Grandmother   . Colon polyps Neg Hx   . Esophageal cancer Neg Hx   . Rectal cancer Neg Hx     Social History   Socioeconomic History  . Marital status: Married    Spouse name: Not on file  . Number of children: Not on file  . Years of education: Not on file  . Highest education level: Not on file  Occupational History  . Not on file  Tobacco Use  . Smoking status: Former Smoker    Packs/day: 0.75    Types: Cigarettes    Quit date: 11/17/1988    Years since quitting: 31.8  . Smokeless tobacco: Never Used  Vaping Use  . Vaping Use: Never used  Substance and Sexual Activity  . Alcohol use: No  . Drug use: No  . Sexual activity: Yes    Birth control/protection: Surgical  Other Topics Concern  . Not  on file  Social History Narrative  . Not on file   Social Determinants of Health   Financial Resource Strain: Not on file  Food Insecurity: Not on file  Transportation Needs: Not on file  Physical Activity: Not on file  Stress: Not on file  Social Connections: Not on file  Intimate Partner Violence: Not on file    Outpatient Medications Prior to Visit  Medication Sig Dispense Refill  . albuterol (VENTOLIN HFA) 108 (90 Base) MCG/ACT inhaler INHALE 1 TO 2 PUFFS INTO THE LUNGS EVERY 6 HOURS AS NEEDED FOR WHEEZING OR SHORTNESS OF BREATH 6.7 g 0  . amLODipine (NORVASC) 10 MG tablet Take 1 tablet (10 mg total) by mouth daily. 90 tablet 3  . Multiple Vitamins-Minerals (MULTIVITAMIN WITH MINERALS) tablet Take 1 tablet by mouth daily.    Marland Kitchen. Spacer/Aero-Holding Chambers (AEROCHAMBER Z-STAT PLUS/MEDIUM) inhaler Use as instructed 1 each 2   . zolpidem (AMBIEN) 10 MG tablet TAKE 1 TABLET(10 MG) BY MOUTH AT BEDTIME AS NEEDED FOR SLEEP 90 tablet 0  . benzonatate (TESSALON) 100 MG capsule Take 1 capsule (100 mg total) by mouth every 8 (eight) hours. (Patient not taking: Reported on 09/26/2020) 21 capsule 0  . phentermine 37.5 MG capsule Take 1 capsule (37.5 mg total) by mouth daily. (Patient not taking: Reported on 09/26/2020) 30 capsule 0  . cyclobenzaprine (FLEXERIL) 5 MG tablet Take 1 tablet (5 mg total) by mouth 3 (three) times daily as needed for muscle spasms. (Patient not taking: Reported on 06/21/2020) 30 tablet 0  . ipratropium-albuterol (DUONEB) 0.5-2.5 (3) MG/3ML SOLN Inhale 3 mLs into the lungs every 6 (six) hours as needed (SOB).     Marland Kitchen. levocetirizine (XYZAL) 5 MG tablet TAKE 1 TABLET(5 MG) BY MOUTH EVERY EVENING 30 tablet 2   No facility-administered medications prior to visit.    Allergies  Allergen Reactions  . Mobic [Meloxicam] Hives    Pt reports she gets real bad hives from Mobic  . Shrimp [Shellfish Allergy] Other (See Comments)    Wheezing.  Patient states she is ok with betadine    ROS Review of Systems  Genitourinary:       Menstrual cycle came on for 4 days after 3 yrs. She admits that her hot flashes have improved.   Psychiatric/Behavioral: Positive for sleep disturbance.  All other systems reviewed and are negative.     Objective:    Physical Exam Constitutional:      General: She is not in acute distress.    Appearance: She is obese. She is not toxic-appearing.  HENT:     Head: Normocephalic and atraumatic.     Nose: Nose normal.     Mouth/Throat:     Mouth: Mucous membranes are moist.  Cardiovascular:     Rate and Rhythm: Normal rate and regular rhythm.     Pulses: Normal pulses.     Heart sounds: Normal heart sounds.  Pulmonary:     Effort: Pulmonary effort is normal.     Breath sounds: Normal breath sounds.  Musculoskeletal:        General: Normal range of motion.     Cervical  back: Normal range of motion.     Right lower leg: No edema.     Left lower leg: No edema.  Skin:    General: Skin is warm and dry.     Capillary Refill: Capillary refill takes less than 2 seconds.  Neurological:     General: No focal deficit present.  Mental Status: She is alert and oriented to person, place, and time.  Psychiatric:        Mood and Affect: Mood normal.        Behavior: Behavior normal.        Thought Content: Thought content normal.        Judgment: Judgment normal.     BP (!) 142/80 (BP Location: Left Arm, Patient Position: Sitting, Cuff Size: Normal)   Pulse 79   Temp 98.4 F (36.9 C) (Temporal)   Ht 5\' 3"  (1.6 m)   Wt 205 lb (93 kg)   SpO2 97%   BMI 36.31 kg/m  Wt Readings from Last 3 Encounters:  09/26/20 205 lb (93 kg)  06/21/20 227 lb 1.2 oz (103 kg)  06/20/20 207 lb (93.9 kg)     Health Maintenance Due  Topic Date Due  . MAMMOGRAM  11/04/2018  . COVID-19 Vaccine (3 - Booster for Pfizer series) 04/29/2020  . PAP SMEAR-Modifier  09/30/2020    There are no preventive care reminders to display for this patient.  Lab Results  Component Value Date   TSH 0.347 (L) 12/10/2019   Lab Results  Component Value Date   WBC 4.6 06/21/2020   HGB 13.3 06/21/2020   HCT 40.7 06/21/2020   MCV 91.7 06/21/2020   PLT 229 06/21/2020   Lab Results  Component Value Date   NA 139 06/21/2020   K 3.6 06/21/2020   CO2 24 06/21/2020   GLUCOSE 125 (H) 06/21/2020   BUN 12 06/21/2020   CREATININE 0.66 06/21/2020   BILITOT 1.4 (H) 04/26/2020   ALKPHOS 71 04/26/2020   AST 29 04/26/2020   ALT 37 (H) 04/26/2020   PROT 7.3 04/26/2020   ALBUMIN 4.1 04/26/2020   CALCIUM 9.0 06/21/2020   ANIONGAP 11 06/21/2020   GFR 124.88 04/26/2020   Lab Results  Component Value Date   CHOL 150 04/26/2020   Lab Results  Component Value Date   HDL 53.60 04/26/2020   Lab Results  Component Value Date   LDLCALC 84 04/26/2020   Lab Results  Component Value Date    TRIG 63.0 04/26/2020   Lab Results  Component Value Date   CHOLHDL 3 04/26/2020   Lab Results  Component Value Date   HGBA1C 5.6 03/17/2019      Assessment & Plan:   Problem List Items Addressed This Visit      Cardiovascular and Mediastinum   Essential hypertension - Primary Stable will continue with current regimen.  Encouraged on going compliance with current medication regimen Encouraged home monitoring and recording BP <130/80 Eating a heart-healthy diet with less salt Encouraged regular physical activity  Recommend Weight loss   Relevant Orders   Comp. Metabolic Panel (12)    Other Visit Diagnoses    Insomnia, unspecified type     Worsening, Weaning of Ambien and desires to try Amitriptyline.    Anxiety     Trial of Amitriptyline 25 mg and will adjust as needed    Relevant Medications   amitriptyline (ELAVIL) 25 MG tablet   Abnormal TSH    Labs pending Further evaluate for possible causes of anxiety or insomina   Relevant Orders   TSH      Meds ordered this encounter  Medications  . amitriptyline (ELAVIL) 25 MG tablet    Sig: Take 1 tablet (25 mg total) by mouth at bedtime.    Dispense:  90 tablet    Refill:  3  Order Specific Question:   Supervising Provider    Answer:   Quentin Angst [[id number]]    Follow-up: Return in about 4 weeks (around 10/24/2020) for follow up for anxiety (can be virtual ).    Barbette Merino, NP

## 2020-09-27 LAB — COMP. METABOLIC PANEL (12)
AST: 26 IU/L (ref 0–40)
Albumin/Globulin Ratio: 1.3 (ref 1.2–2.2)
Albumin: 3.9 g/dL (ref 3.8–4.9)
Alkaline Phosphatase: 91 IU/L (ref 44–121)
BUN/Creatinine Ratio: 13 (ref 9–23)
BUN: 9 mg/dL (ref 6–24)
Bilirubin Total: 0.7 mg/dL (ref 0.0–1.2)
Calcium: 8.7 mg/dL (ref 8.7–10.2)
Chloride: 103 mmol/L (ref 96–106)
Creatinine, Ser: 0.68 mg/dL (ref 0.57–1.00)
Globulin, Total: 2.9 g/dL (ref 1.5–4.5)
Glucose: 90 mg/dL (ref 65–99)
Potassium: 3.7 mmol/L (ref 3.5–5.2)
Sodium: 141 mmol/L (ref 134–144)
Total Protein: 6.8 g/dL (ref 6.0–8.5)
eGFR: 105 mL/min/{1.73_m2} (ref >59)

## 2020-09-27 LAB — TSH: TSH: 0.396 u[IU]/mL — ABNORMAL LOW (ref 0.450–4.500)

## 2020-10-24 ENCOUNTER — Encounter: Payer: Self-pay | Admitting: Nurse Practitioner

## 2020-10-24 ENCOUNTER — Telehealth (INDEPENDENT_AMBULATORY_CARE_PROVIDER_SITE_OTHER): Payer: BC Managed Care – PPO | Admitting: Nurse Practitioner

## 2020-10-24 DIAGNOSIS — G4700 Insomnia, unspecified: Secondary | ICD-10-CM

## 2020-10-24 MED ORDER — ZOLPIDEM TARTRATE 10 MG PO TABS: 10.0000 mg | ORAL_TABLET | Freq: Every day | ORAL | 0 refills | Status: DC

## 2020-10-24 NOTE — Patient Instructions (Signed)
Insomnia Insomnia is a sleep disorder that makes it difficult to fall asleep or stay asleep. Insomnia can cause fatigue, low energy, difficulty concentrating, mood swings, and poor performance at work or school. There are three different ways to classify insomnia:  Difficulty falling asleep.  Difficulty staying asleep.  Waking up too early in the morning. Any type of insomnia can be long-term (chronic) or short-term (acute). Both are common. Short-term insomnia usually lasts for three months or less. Chronic insomnia occurs at least three times a week for longer than three months. What are the causes? Insomnia may be caused by another condition, situation, or substance, such as:  Anxiety.  Certain medicines.  Gastroesophageal reflux disease (GERD) or other gastrointestinal conditions.  Asthma or other breathing conditions.  Restless legs syndrome, sleep apnea, or other sleep disorders.  Chronic pain.  Menopause.  Stroke.  Abuse of alcohol, tobacco, or illegal drugs.  Mental health conditions, such as depression.  Caffeine.  Neurological disorders, such as Alzheimer's disease.  An overactive thyroid (hyperthyroidism). Sometimes, the cause of insomnia may not be known. What increases the risk? Risk factors for insomnia include:  Gender. Women are affected more often than men.  Age. Insomnia is more common as you get older.  Stress.  Lack of exercise.  Irregular work schedule or working night shifts.  Traveling between different time zones.  Certain medical and mental health conditions. What are the signs or symptoms? If you have insomnia, the main symptom is having trouble falling asleep or having trouble staying asleep. This may lead to other symptoms, such as:  Feeling fatigued or having low energy.  Feeling nervous about going to sleep.  Not feeling rested in the morning.  Having trouble concentrating.  Feeling irritable, anxious, or depressed. How  is this diagnosed? This condition may be diagnosed based on:  Your symptoms and medical history. Your health care provider may ask about: ? Your sleep habits. ? Any medical conditions you have. ? Your mental health.  A physical exam. How is this treated? Treatment for insomnia depends on the cause. Treatment may focus on treating an underlying condition that is causing insomnia. Treatment may also include:  Medicines to help you sleep.  Counseling or therapy.  Lifestyle adjustments to help you sleep better. Follow these instructions at home: Eating and drinking  Limit or avoid alcohol, caffeinated beverages, and cigarettes, especially close to bedtime. These can disrupt your sleep.  Do not eat a large meal or eat spicy foods right before bedtime. This can lead to digestive discomfort that can make it hard for you to sleep.   Sleep habits  Keep a sleep diary to help you and your health care provider figure out what could be causing your insomnia. Write down: ? When you sleep. ? When you wake up during the night. ? How well you sleep. ? How rested you feel the next day. ? Any side effects of medicines you are taking. ? What you eat and drink.  Make your bedroom a dark, comfortable place where it is easy to fall asleep. ? Put up shades or blackout curtains to block light from outside. ? Use a white noise machine to block noise. ? Keep the temperature cool.  Limit screen use before bedtime. This includes: ? Watching TV. ? Using your smartphone, tablet, or computer.  Stick to a routine that includes going to bed and waking up at the same times every day and night. This can help you fall asleep faster. Consider   making a quiet activity, such as reading, part of your nighttime routine.  Try to avoid taking naps during the day so that you sleep better at night.  Get out of bed if you are still awake after 15 minutes of trying to sleep. Keep the lights down, but try reading or  doing a quiet activity. When you feel sleepy, go back to bed.   General instructions  Take over-the-counter and prescription medicines only as told by your health care provider.  Exercise regularly, as told by your health care provider. Avoid exercise starting several hours before bedtime.  Use relaxation techniques to manage stress. Ask your health care provider to suggest some techniques that may work well for you. These may include: ? Breathing exercises. ? Routines to release muscle tension. ? Visualizing peaceful scenes.  Make sure that you drive carefully. Avoid driving if you feel very sleepy.  Keep all follow-up visits as told by your health care provider. This is important. Contact a health care provider if:  You are tired throughout the day.  You have trouble in your daily routine due to sleepiness.  You continue to have sleep problems, or your sleep problems get worse. Get help right away if:  You have serious thoughts about hurting yourself or someone else. If you ever feel like you may hurt yourself or others, or have thoughts about taking your own life, get help right away. You can go to your nearest emergency department or call:  Your local emergency services (911 in the [place]  A suicide crisis helpline, such as the National Suicide Prevention Lifeline at 706-799-4269. This is open 24 hours a day. Summary  Insomnia is a sleep disorder that makes it difficult to fall asleep or stay asleep.  Insomnia can be long-term (chronic) or short-term (acute).  Treatment for insomnia depends on the cause. Treatment may focus on treating an underlying condition that is causing insomnia.  Keep a sleep diary to help you and your health care provider figure out what could be causing your insomnia. This information is not intended to replace advice given to you by your health care provider. Make sure you discuss any questions you have with your health care provider. Document  Revised: 05/26/2020 Document Reviewed: 05/26/2020 Elsevier Patient Education  2021 Elsevier Inc.   Managing Anxiety, Adult After being diagnosed with an anxiety disorder, you may be relieved to know why you have felt or behaved a certain way. You may also feel overwhelmed about the treatment ahead and what it will mean for your life. With care and support, you can manage this condition and recover from it. How to manage lifestyle changes Managing stress and anxiety Stress is your body's reaction to life changes and events, both good and bad. Most stress will last just a few hours, but stress can be ongoing and can lead to more than just stress. Although stress can play a major role in anxiety, it is not the same as anxiety. Stress is usually caused by something external, such as a deadline, test, or competition. Stress normally passes after the triggering event has ended.  Anxiety is caused by something internal, such as imagining a terrible outcome or worrying that something will go wrong that will devastate you. Anxiety often does not go away even after the triggering event is over, and it can become long-term (chronic) worry. It is important to understand the differences between stress and anxiety and to manage your stress effectively so that it does not  lead to an anxious response. Talk with your health care provider or a counselor to learn more about reducing anxiety and stress. He or she may suggest tension reduction techniques, such as:  Music therapy. This can include creating or listening to music that you enjoy and that inspires you.  Mindfulness-based meditation. This involves being aware of your normal breaths while not trying to control your breathing. It can be done while sitting or walking.  Centering prayer. This involves focusing on a word, phrase, or sacred image that means something to you and brings you peace.  Deep breathing. To do this, expand your stomach and inhale slowly  through your nose. Hold your breath for 3-5 seconds. Then exhale slowly, letting your stomach muscles relax.  Self-talk. This involves identifying thought patterns that lead to anxiety reactions and changing those patterns.  Muscle relaxation. This involves tensing muscles and then relaxing them. Choose a tension reduction technique that suits your lifestyle and personality. These techniques take time and practice. Set aside 5-15 minutes a day to do them. Therapists can offer counseling and training in these techniques. The training to help with anxiety may be covered by some insurance plans. Other things you can do to manage stress and anxiety include:  Keeping a stress/anxiety diary. This can help you learn what triggers your reaction and then learn ways to manage your response.  Thinking about how you react to certain situations. You may not be able to control everything, but you can control your response.  Making time for activities that help you relax and not feeling guilty about spending your time in this way.  Visual imagery and yoga can help you stay calm and relax.   Medicines Medicines can help ease symptoms. Medicines for anxiety include:  Anti-anxiety drugs.  Antidepressants. Medicines are often used as a primary treatment for anxiety disorder. Medicines will be prescribed by a health care provider. When used together, medicines, psychotherapy, and tension reduction techniques may be the most effective treatment. Relationships Relationships can play a big part in helping you recover. Try to spend more time connecting with trusted friends and family members. Consider going to couples counseling, taking family education classes, or going to family therapy. Therapy can help you and others better understand your condition. How to recognize changes in your anxiety Everyone responds differently to treatment for anxiety. Recovery from anxiety happens when symptoms decrease and stop  interfering with your daily activities at home or work. This may mean that you will start to:  Have better concentration and focus. Worry will interfere less in your daily thinking.  Sleep better.  Be less irritable.  Have more energy.  Have improved memory. It is important to recognize when your condition is getting worse. Contact your health care provider if your symptoms interfere with home or work and you feel like your condition is not improving. Follow these instructions at home: Activity  Exercise. Most adults should do the following: ? Exercise for at least 150 minutes each week. The exercise should increase your heart rate and make you sweat (moderate-intensity exercise). ? Strengthening exercises at least twice a week.  Get the right amount and quality of sleep. Most adults need 7-9 hours of sleep each night. Lifestyle  Eat a healthy diet that includes plenty of vegetables, fruits, whole grains, low-fat dairy products, and lean protein. Do not eat a lot of foods that are high in solid fats, added sugars, or salt.  Make choices that simplify your life.  Do not use any products that contain nicotine or tobacco, such as cigarettes, e-cigarettes, and chewing tobacco. If you need help quitting, ask your health care provider.  Avoid caffeine, alcohol, and certain over-the-counter cold medicines. These may make you feel worse. Ask your pharmacist which medicines to avoid.   General instructions  Take over-the-counter and prescription medicines only as told by your health care provider.  Keep all follow-up visits as told by your health care provider. This is important. Where to find support You can get help and support from these sources:  Self-help groups.  Online and [employer].  A trusted spiritual leader.  Couples counseling.  Family education classes.  Family therapy. Where to find more information You may find that joining a support group helps  you deal with your anxiety. The following sources can help you locate counselors or support groups near you:  Mental Health America: www.mentalhealthamerica.net  Anxiety and Depression Association of [place] (ADAA): ProgramCam.de  [employer] on Mental Illness (NAMI): www.nami.org Contact a health care provider if you:  Have a hard time staying focused or finishing daily tasks.  Spend many hours a day feeling worried about everyday life.  Become exhausted by worry.  Start to have headaches, feel tense, or have nausea.  Urinate more than normal.  Have diarrhea. Get help right away if you have:  A racing heart and shortness of breath.  Thoughts of hurting yourself or others. If you ever feel like you may hurt yourself or others, or have thoughts about taking your own life, get help right away. You can go to your nearest emergency department or call:  Your local emergency services (911 in the [place]  A suicide crisis helpline, such as the National Suicide Prevention Lifeline at 412-118-3388. This is open 24 hours a day. Summary  Taking steps to learn and use tension reduction techniques can help calm you and help prevent triggering an anxiety reaction.  When used together, medicines, psychotherapy, and tension reduction techniques may be the most effective treatment.  Family, friends, and partners can play a big part in helping you recover from an anxiety disorder. This information is not intended to replace advice given to you by your health care provider. Make sure you discuss any questions you have with your health care provider. Document Revised: 12/16/2018 Document Reviewed: 12/16/2018 Elsevier Patient Education  2021 [employer].

## 2020-10-24 NOTE — Progress Notes (Signed)
   [facility] Patient [facility] [address] [place], [place]  [address] Phone:  [phone]   Fax:  [phone] Virtual Visit via telephone Note  I connected with Wendy James on 10/24/20 at  3:00 PM EDT by telephone and verified that I am speaking with the correct person using two identifiers.   I discussed the limitations, risks, security and privacy concerns of performing an evaluation and management service by telephone and the availability of in person appointments. I also discussed with the patient that there may be a patient responsible charge related to this service. The patient expressed understanding and agreed to proceed.  Patient home Provider Office  History of Present Illness: She is following up today virtually for medication change.  She requested to start the amitriptyline 25 mg to see if this will be effective for anxiety and insomnia. She admits that she is not having luck with the amitriptyline.  She would like to return back to zolpidem 10 mg nightly. She also admits that she is having some sinus problems today.  She feels like her current regimen is working for that.  Observations/Objective: Virtual visit no exam  Assessment and Plan: Assessment  Primary Diagnosis & Pertinent Problem List: The encounter diagnosis was Insomnia, unspecified type.  Visit Diagnosis: 1. Insomnia, unspecified type  Amitriptyline discontinued due to being ineffective Restarted zolpidem 10 mg nightly    Follow Up Instructions: Follow-up 3 to 6 months for hypertension [address]   I discussed the assessment and treatment plan with the patient. The patient was provided an opportunity to ask questions and all were answered. The patient agreed with the plan and demonstrated an understanding of the instructions.   The patient was advised to call back or seek an in-person evaluation if the symptoms worsen or if the condition fails to improve as anticipated.  I provided 5 minutes of  video- face-to-face time during this encounter.   Barbette Merino, NP

## 2020-10-31 ENCOUNTER — Other Ambulatory Visit: Payer: Self-pay | Admitting: Nurse Practitioner

## 2020-11-20 ENCOUNTER — Other Ambulatory Visit: Payer: Self-pay | Admitting: Nurse Practitioner

## 2020-11-21 ENCOUNTER — Other Ambulatory Visit: Payer: Self-pay

## 2020-11-21 MED ORDER — LEVOCETIRIZINE DIHYDROCHLORIDE 5 MG PO TABS: 5.0000 mg | ORAL_TABLET | Freq: Every day | ORAL | 1 refills | Status: DC

## 2020-11-21 MED ORDER — BENZONATATE 100 MG PO CAPS: 100.0000 mg | ORAL_CAPSULE | Freq: Three times a day (TID) | ORAL | 0 refills | Status: DC

## 2020-11-21 NOTE — Telephone Encounter (Signed)
Pt wants to start back on this medication

## 2020-11-21 NOTE — Telephone Encounter (Signed)
Med refill  Tuslan pearls  Allergy medicine

## 2020-11-23 ENCOUNTER — Other Ambulatory Visit: Payer: Self-pay | Admitting: Nurse Practitioner

## 2020-11-23 ENCOUNTER — Other Ambulatory Visit: Payer: Self-pay

## 2020-11-23 MED ORDER — PHENTERMINE HCL 37.5 MG PO CAPS: 37.5000 mg | ORAL_CAPSULE | Freq: Every day | ORAL | 0 refills | Status: DC

## 2020-11-23 MED ORDER — ALBUTEROL SULFATE HFA 108 (90 BASE) MCG/ACT IN AERS: INHALATION_SPRAY | RESPIRATORY_TRACT | 0 refills | Status: DC

## 2020-11-23 NOTE — Progress Notes (Signed)
   [facility] Patient [facility] [address] [place], [place]  [address] Phone:  [phone]   Fax:  [phone]  Refill request for phentermine

## 2020-12-15 ENCOUNTER — Other Ambulatory Visit: Payer: Self-pay | Admitting: Nurse Practitioner

## 2020-12-29 ENCOUNTER — Telehealth: Payer: Self-pay

## 2020-12-29 NOTE — Telephone Encounter (Addendum)
Med refill  Asthma Allergy Sleep medicines  Pt asking for a new neublerizer machine?

## 2021-01-02 ENCOUNTER — Other Ambulatory Visit: Payer: Self-pay | Admitting: Nurse Practitioner

## 2021-01-02 DIAGNOSIS — G4700 Insomnia, unspecified: Secondary | ICD-10-CM

## 2021-01-02 MED ORDER — ZOLPIDEM TARTRATE 10 MG PO TABS: 10.0000 mg | ORAL_TABLET | Freq: Every day | ORAL | 0 refills | Status: DC

## 2021-01-02 MED ORDER — ALBUTEROL SULFATE HFA 108 (90 BASE) MCG/ACT IN AERS
1.0000 | INHALATION_SPRAY | RESPIRATORY_TRACT | 0 refills | Status: DC | PRN
Start: 2021-01-02 — End: 2021-03-14

## 2021-01-02 MED ORDER — LEVOCETIRIZINE DIHYDROCHLORIDE 5 MG PO TABS: 5.0000 mg | ORAL_TABLET | Freq: Every evening | ORAL | 1 refills | Status: DC

## 2021-01-02 NOTE — Progress Notes (Signed)
   [facility] [address] [place], [place]  [address] Phone:  [phone]   Fax:  [phone] 

## 2021-02-09 ENCOUNTER — Other Ambulatory Visit: Payer: Self-pay

## 2021-02-09 MED ORDER — AMLODIPINE BESYLATE 10 MG PO TABS: 10.0000 mg | ORAL_TABLET | Freq: Every day | ORAL | 3 refills | Status: DC

## 2021-03-13 ENCOUNTER — Telehealth: Payer: Self-pay

## 2021-03-13 NOTE — Telephone Encounter (Signed)
Sleep med Allergy  Albuterol Teslin pearl for cough

## 2021-03-14 MED ORDER — LEVOCETIRIZINE DIHYDROCHLORIDE 5 MG PO TABS: 5.0000 mg | ORAL_TABLET | Freq: Every day | ORAL | 1 refills | Status: DC

## 2021-03-14 MED ORDER — ALBUTEROL SULFATE HFA 108 (90 BASE) MCG/ACT IN AERS: 1.0000 | INHALATION_SPRAY | RESPIRATORY_TRACT | 0 refills | Status: DC | PRN

## 2021-03-14 NOTE — Addendum Note (Signed)
Addended by: Eduard Clos on: 03/14/2021 02:53 PM   Modules accepted: Orders

## 2021-03-15 ENCOUNTER — Other Ambulatory Visit: Payer: Self-pay | Admitting: Nurse Practitioner

## 2021-03-15 DIAGNOSIS — G4700 Insomnia, unspecified: Secondary | ICD-10-CM

## 2021-03-15 MED ORDER — BENZONATATE 100 MG PO CAPS: 100.0000 mg | ORAL_CAPSULE | Freq: Three times a day (TID) | ORAL | 0 refills | Status: AC

## 2021-03-15 MED ORDER — ZOLPIDEM TARTRATE 10 MG PO TABS: 10.0000 mg | ORAL_TABLET | Freq: Every day | ORAL | 0 refills | Status: DC

## 2021-03-19 ENCOUNTER — Encounter (HOSPITAL_COMMUNITY): Payer: Self-pay | Admitting: Oncology

## 2021-03-19 ENCOUNTER — Emergency Department (HOSPITAL_COMMUNITY)
Admission: EM | Admit: 2021-03-19 | Discharge: 2021-03-19 | Disposition: A | Discharge status: Home / Self Care | Payer: BC Managed Care – PPO | Attending: Emergency Medicine | Admitting: Emergency Medicine

## 2021-03-19 ENCOUNTER — Other Ambulatory Visit: Payer: Self-pay

## 2021-03-19 DIAGNOSIS — J45901 Unspecified asthma with (acute) exacerbation: Secondary | ICD-10-CM | POA: Diagnosis not present

## 2021-03-19 DIAGNOSIS — I10 Essential (primary) hypertension: Secondary | ICD-10-CM | POA: Insufficient documentation

## 2021-03-19 DIAGNOSIS — J45909 Unspecified asthma, uncomplicated: Secondary | ICD-10-CM | POA: Diagnosis not present

## 2021-03-19 DIAGNOSIS — Z79899 Other long term (current) drug therapy: Secondary | ICD-10-CM | POA: Diagnosis not present

## 2021-03-19 DIAGNOSIS — Z87891 Personal history of nicotine dependence: Secondary | ICD-10-CM | POA: Insufficient documentation

## 2021-03-19 DIAGNOSIS — R21 Rash and other nonspecific skin eruption: Secondary | ICD-10-CM | POA: Diagnosis present

## 2021-03-19 DIAGNOSIS — Z7951 Long term (current) use of inhaled steroids: Secondary | ICD-10-CM | POA: Insufficient documentation

## 2021-03-19 DIAGNOSIS — B029 Zoster without complications: Secondary | ICD-10-CM | POA: Insufficient documentation

## 2021-03-19 MED ORDER — DIPHENHYDRAMINE HCL 25 MG PO CAPS
25.0000 mg | ORAL_CAPSULE | Freq: Once | ORAL | Status: AC
  Administered 2021-03-19: 25 mg via ORAL
  Filled 2021-03-19: qty 1

## 2021-03-19 MED ORDER — VALACYCLOVIR HCL 1 G PO TABS: 1000.0000 mg | ORAL_TABLET | Freq: Three times a day (TID) | ORAL | 0 refills | Status: DC

## 2021-03-19 NOTE — ED Provider Notes (Signed)
[facility]-EMERGENCY DEPT Provider Note   CSN: [id number] Arrival date & time: 03/19/21  1334     History Chief Complaint  Patient presents with   Rash    Wendy James is a 50 y.o. female with medical history as noted below.  Patient presents emergency department with a chief complaint of rash to right last.  Patient reports that she started having burning sensation to the right side of her neck last weekend.  Patient has had burning sensation and pruritus constantly since then.  Last night patient developed rash to affected area.  Patient denies any aggravating factors.  Patient has had no relief of symptoms with Benadryl or hydrocortisone cream.  Patient denies any other rash on her body.  Patient denies any change in cosmetics or cleaning products.  Patient denies any facial swelling, trouble swallowing, sore throat, shortness of breath, nausea, vomiting, diarrhea, visual disturbance, visual loss, eye pain, eye pruritus, ear pain, ear discharge.   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   Asthma    Bone spur of foot    Hypertension    controlled    Seasonal allergies     Patient Active Problem List   Diagnosis Date Noted   Abnormal serum thyroid stimulating hormone (TSH) level 12/14/2019   Vitamin D insufficiency 12/14/2019   Asthma exacerbation 04/08/2019   Acute otitis media 12/04/2018   Acute ear pain, left 12/04/2018   Heel spur, left 06/11/2018   Plantar fasciitis, left 03/11/2018   Influenza A with respiratory manifestations 09/06/2016   Prediabetes 05/08/2015   Essential hypertension 05/08/2015   Mild intermittent asthma 05/06/2015   Leukocytosis, steroid induced  04/29/2015   Steroid-induced hyperglycemia 04/29/2015   Accelerated hypertension 04/28/2015    Anemia of chroic diseae, IDA 11/17/2013   Bronchitis, acute 04/12/2012   Acute asthma exacerbation 03/12/2012   Acute respiratory failure (HCC) 03/12/2012    Past Surgical History:  Procedure Laterality Date   CESAREAN SECTION     x 4   CHOLECYSTECTOMY     HEEL SPUR EXCISION     08/2017   HYSTEROSCOPY WITH D & C N/A 02/18/2015   Procedure: DILATATION AND CURETTAGE /HYSTEROSCOPY;  Surgeon: Allie Bossier, MD;  Location: [facility];  Service: Gynecology;  Laterality: N/A;   TUBAL LIGATION       OB History     Gravida  5   Para  4   Term  4   Preterm  0   AB  1   Living  3      SAB  1   IAB  0   Ectopic  0   Multiple  0   Live Births              Family History  Problem Relation Age of Onset   Hypertension Mother    Stroke Sister    Seizures Sister    Colon cancer Maternal Aunt    Stomach cancer Maternal Uncle    Colon cancer Maternal Grandmother    Colon polyps Neg Hx    Esophageal cancer Neg Hx    Rectal cancer Neg Hx     Social History   Tobacco Use   Smoking status: Former    Packs/day: 0.75  Types: Cigarettes    Quit date: 11/17/1988    Years since quitting: 32.3   Smokeless tobacco: Never  Vaping Use   Vaping Use: Never used  Substance Use Topics   Alcohol use: No   Drug use: No    Home Medications Prior to Admission medications   Medication Sig Start Date End Date Taking? Authorizing Provider  albuterol (VENTOLIN HFA) 108 (90 Base) MCG/ACT inhaler Inhale 1-2 puffs into the lungs every 4 (four) hours as needed for wheezing or shortness of breath. 03/14/21 03/14/22  Barbette Merino, NP  amLODipine (NORVASC) 10 MG tablet Take 1 tablet (10 mg total) by mouth daily. 02/09/21   Barbette Merino, NP  benzonatate (TESSALON) 100 MG capsule Take 1 capsule (100 mg total) by mouth every 8 (eight) hours for 10 days. 03/15/21 03/25/21  Barbette Merino, NP  levocetirizine (XYZAL) 5 MG tablet Take 1 tablet (5 mg total) by mouth at bedtime. 03/14/21   Barbette Merino, NP  Multiple Vitamins-Minerals (MULTIVITAMIN WITH MINERALS) tablet Take 1 tablet by mouth daily.    [provider]  phentermine 37.5 MG capsule Take 1 capsule (37.5 mg total) by mouth daily. 11/23/20 12/23/20  Barbette Merino, NP  Spacer/Aero-Holding Chambers (AEROCHAMBER Z-STAT PLUS/MEDIUM) inhaler Use as instructed 04/10/19   Pearson Grippe, MD  SYMBICORT 80-4.5 MCG/ACT inhaler Inhale into the lungs. 07/01/20   [provider]  zolpidem (AMBIEN) 10 MG tablet Take 1 tablet (10 mg total) by mouth at bedtime. 03/15/21   Barbette Merino, NP  cetirizine (ZYRTEC) 10 MG tablet Take 1 tablet (10 mg total) by mouth daily. 03/17/19 04/20/20  Massie Maroon, FNP    Allergies    Mobic [meloxicam] and Shrimp [shellfish allergy]  Review of Systems   Review of Systems  Constitutional:  Negative for chills and fever.  HENT:  Negative for ear discharge, ear pain and facial swelling.   Eyes:  Negative for photophobia, pain, discharge, redness, itching and visual disturbance.  Respiratory:  Negative for shortness of breath.   Cardiovascular:  Negative for chest pain.  Gastrointestinal:  Negative for abdominal pain, diarrhea, nausea and vomiting.  Musculoskeletal:  Negative for back pain, neck pain and neck stiffness.  Skin:  Positive for rash. Negative for color change, pallor and wound.  Allergic/Immunologic: Negative for immunocompromised state.  Neurological:  Negative for dizziness, syncope, light-headedness and headaches.  Psychiatric/Behavioral:  Negative for confusion.    Physical Exam Updated Vital Signs BP (!) 183/122 (BP Location: Left Arm)   Pulse 67   Temp 98.7 F (37.1 C) (Oral)   Resp 16   Ht 5\' 2"  (1.575 m)   Wt 97.5 kg   SpO2 98%   BMI 39.32 kg/m   Physical Exam Vitals and nursing note reviewed.  Constitutional:      General: She is not in acute distress.    Appearance: She is not ill-appearing, toxic-appearing or diaphoretic.  HENT:     Head:  Normocephalic.     Right Ear: Tympanic membrane, ear canal and external ear normal. No mastoid tenderness.     Mouth/Throat:     Mouth: Mucous membranes are moist. No lacerations, oral lesions or angioedema.     Pharynx: Oropharynx is clear. Uvula midline. No pharyngeal swelling, oropharyngeal exudate, posterior oropharyngeal erythema or uvula swelling.     Tonsils: No tonsillar exudate or tonsillar abscesses. 1+ on the right. 1+ on the left.  Eyes:     General: No scleral icterus.  Right eye: No discharge.        Left eye: No discharge.  Cardiovascular:     Rate and Rhythm: Normal rate.  Pulmonary:     Effort: Pulmonary effort is normal. No tachypnea, bradypnea or respiratory distress.     Breath sounds: Normal breath sounds. No stridor.  Musculoskeletal:     Cervical back: Normal range of motion and neck supple. No edema, erythema, signs of trauma, rigidity, torticollis or crepitus. No pain with movement, spinous process tenderness or muscular tenderness. Normal range of motion.  Skin:    General: Skin is warm and dry.     Findings: Rash present. No petechiae. Rash is not papular, purpuric, pustular or urticarial.     Comments: Maculopapular rash noted to right side of patient's neck,   Neurological:     General: No focal deficit present.     Mental Status: She is alert.     GCS: GCS eye subscore is 4. GCS verbal subscore is 5. GCS motor subscore is 6.     Comments: CN II through XII intact  Psychiatric:        Behavior: Behavior is cooperative.      ED Results / Procedures / Treatments   Labs (all labs ordered are listed, but only abnormal results are displayed) Labs Reviewed - No data to display  EKG None  Radiology No results found.  Procedures Procedures   Medications Ordered in ED Medications  diphenhydrAMINE (BENADRYL) capsule 25 mg (25 mg Oral Given 03/19/21 1421)    ED Course  I have reviewed the triage vital signs and the nursing notes.  Pertinent  labs & imaging results that were available during my care of the patient were reviewed by me and considered in my medical decision making (see chart for details).    MDM Rules/Calculators/A&P                           Alert 50 year old female no acute distress, nontoxic appearing.  Presents emergency department with a chief plaint of rash to right side of her neck.  Rash began after 1 week of burning sensation.  Maculopapular rash noted to right side of patient's neck.  Rash isolated to C2 dermatome.  No petechia or purpura.  No rash to oral mucosa.  Negative Nikolsky sign.  Low suspicion for SJS.  Due to patient's preceding burning sensation prior to rash and rash isolated to C2 dermatome formation suspect herpes zoster.  Due to rash formation within the last 72 hours we will start patient on valacyclovir.  Patient to follow-up with primary care provider.  Discussed results, findings, treatment and follow up. Patient advised of return precautions. Patient verbalized understanding and agreed with plan.   Final Clinical Impression(s) / ED Diagnoses Final diagnoses:  Herpes zoster without complication    Rx / DC Orders ED Discharge Orders          Ordered    valACYclovir (VALTREX) 1000 MG tablet  3 times daily        03/19/21 [address] 03/19/21 2131    Cathren Laine, MD 03/22/21 1535

## 2021-03-19 NOTE — Discharge Instructions (Addendum)
You came to the [facility] apartment today to have your rash evaluated.  Your rash is concerning with a shingles infection.  Because of this you were started on the medication valacyclovir.  Please take this medication 3 times daily for the next 7 days.  Please follow-up with your primary care provider in 1 week.  Please take Ibuprofen (Advil, motrin) and Tylenol (acetaminophen) to relieve your pain.    You may take up to 600 MG (3 pills) of normal strength ibuprofen every 8 hours as needed.   You make take tylenol, up to 1,000 mg (two extra strength pills) every 8 hours as needed.   It is safe to take ibuprofen and tylenol at the same time as they work differently.   Do not take more than 3,000 mg tylenol in a 24 hour period (not more than one dose every 8 hours.  Please check all medication labels as many medications such as pain and cold medications may contain tylenol.  Do not drink alcohol while taking these medications.  Do not take other NSAID'S while taking ibuprofen (such as aleve or naproxen).  Please take ibuprofen with food to decrease stomach upset.  Get help right away if: The rash is on your face or nose. You have facial pain, pain around your eye area, or loss of feeling on one side of your face. You have difficulty seeing. You have ear pain or have ringing in your ear. You have a loss of taste. Your condition gets worse.

## 2021-03-19 NOTE — ED Provider Notes (Signed)
Emergency Medicine Provider Triage Evaluation Note  Wendy James , a 50 y.o. female  was evaluated in triage.  Pt complains of rash to right side of her neck.  Rash started last week after wearing costume jewelry.  Rash has gradually gotten worse over time.  Patient endorses pruritus and burning sensation to rash.  No relief of symptoms with Benadryl or hydrocortisone cream.    Review of Systems  Positive: Rash Negative: Shortness of breath, facial swelling, tongue swelling, trouble swallowing  Physical Exam  BP (!) 183/122 (BP Location: Left Arm)   Pulse 67   Temp 98.7 F (37.1 C) (Oral)   Resp 16   Ht 5\' 2"  (1.575 m)   Wt 97.5 kg   SpO2 98%   BMI 39.32 kg/m  Gen:   Awake, no distress   Resp:  Normal effort, lungs clear to auscultation bilaterally MSK:   Moves extremities without difficulty  Other:  Rash noted to right side of patient's neck, no petechia, purpura, or urticaria  Medical Decision Making  Medically screening exam initiated at 1:48 PM.  Appropriate orders placed.  Wendy James was informed that the remainder of the evaluation will be completed by another provider, this initial triage assessment does not replace that evaluation, and the importance of remaining in the ED until their evaluation is complete.  The patient appears stable so that the remainder of the work up may be completed by another provider.      Wendy Mering, PA-C 03/19/21 1349    03/21/21, MD 03/22/21 1535

## 2021-03-19 NOTE — ED Triage Notes (Signed)
Pt reports rash to right side of neck.

## 2021-04-11 ENCOUNTER — Emergency Department (HOSPITAL_COMMUNITY)
Admission: EM | Admit: 2021-04-11 | Discharge: 2021-04-11 | Disposition: A | Discharge status: Home / Self Care | Payer: BC Managed Care – PPO | Attending: Emergency Medicine | Admitting: Emergency Medicine

## 2021-04-11 ENCOUNTER — Other Ambulatory Visit: Payer: Self-pay

## 2021-04-11 ENCOUNTER — Emergency Department (HOSPITAL_COMMUNITY): Payer: BC Managed Care – PPO

## 2021-04-11 ENCOUNTER — Encounter (HOSPITAL_COMMUNITY): Payer: Self-pay

## 2021-04-11 DIAGNOSIS — R030 Elevated blood-pressure reading, without diagnosis of hypertension: Secondary | ICD-10-CM | POA: Insufficient documentation

## 2021-04-11 DIAGNOSIS — Z79899 Other long term (current) drug therapy: Secondary | ICD-10-CM | POA: Insufficient documentation

## 2021-04-11 DIAGNOSIS — Z7951 Long term (current) use of inhaled steroids: Secondary | ICD-10-CM | POA: Insufficient documentation

## 2021-04-11 DIAGNOSIS — M5442 Lumbago with sciatica, left side: Secondary | ICD-10-CM | POA: Diagnosis not present

## 2021-04-11 DIAGNOSIS — Z87891 Personal history of nicotine dependence: Secondary | ICD-10-CM | POA: Diagnosis not present

## 2021-04-11 DIAGNOSIS — J4520 Mild intermittent asthma, uncomplicated: Secondary | ICD-10-CM | POA: Insufficient documentation

## 2021-04-11 DIAGNOSIS — I10 Essential (primary) hypertension: Secondary | ICD-10-CM | POA: Diagnosis not present

## 2021-04-11 DIAGNOSIS — M79605 Pain in left leg: Secondary | ICD-10-CM | POA: Diagnosis not present

## 2021-04-11 DIAGNOSIS — M545 Low back pain, unspecified: Secondary | ICD-10-CM | POA: Diagnosis present

## 2021-04-11 MED ORDER — METHOCARBAMOL 500 MG PO TABS: 500.0000 mg | ORAL_TABLET | Freq: Two times a day (BID) | ORAL | 0 refills | Status: DC

## 2021-04-11 MED ORDER — LIDOCAINE 5 % EX PTCH
1.0000 | MEDICATED_PATCH | CUTANEOUS | 0 refills | Status: DC
Start: 2021-04-11 — End: 2021-08-10

## 2021-04-11 NOTE — ED Provider Notes (Addendum)
[facility]-EMERGENCY DEPT Provider Note   CSN: [id number] Arrival date & time: 04/11/21  0949     History Chief Complaint  Patient presents with   Back Pain    Julietta Batterman is a 50 y.o. female history includes obesity, hypertension, allergies, arthritis, asthma.  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  Pain is mild-moderate, aching, worsens with flexion and rotation of the lumbar spine improves with rest and ibuprofen.  Denies fall/injury, fever/chills, abdominal pain, nausea/vomiting, dysuria/hematuria, numbness/weakness, tingling, saddle or paresthesias, bowel/bladder incontinence, urinary retention, IVDU, history of cancer or any additional concerns today.  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   Asthma    Bone spur of foot    Hypertension    controlled    Seasonal allergies     Patient Active Problem List   Diagnosis Date Noted   Abnormal serum thyroid stimulating hormone (TSH) level 12/14/2019   Vitamin D insufficiency 12/14/2019   Asthma exacerbation 04/08/2019   Acute otitis media 12/04/2018   Acute ear pain, left 12/04/2018   Heel spur, left 06/11/2018   Plantar fasciitis, left 03/11/2018   Influenza A with respiratory manifestations 09/06/2016   Prediabetes 05/08/2015   Essential hypertension 05/08/2015   Mild intermittent asthma 05/06/2015   Leukocytosis, steroid induced  04/29/2015   Steroid-induced hyperglycemia 04/29/2015   Accelerated hypertension 04/28/2015   Anemia of chroic diseae, IDA 11/17/2013   Bronchitis, acute 04/12/2012   Acute asthma exacerbation 03/12/2012   Acute respiratory failure (HCC) 03/12/2012    Past Surgical History:  Procedure Laterality Date   CESAREAN SECTION     x 4   CHOLECYSTECTOMY     HEEL SPUR EXCISION      08/2017   HYSTEROSCOPY WITH D & C N/A 02/18/2015   Procedure: DILATATION AND CURETTAGE /HYSTEROSCOPY;  Surgeon: Allie Bossier, MD;  Location: [facility];  Service: Gynecology;  Laterality: N/A;   TUBAL LIGATION       OB History     Gravida  5   Para  4   Term  4   Preterm  0   AB  1   Living  3      SAB  1   IAB  0   Ectopic  0   Multiple  0   Live Births              Family History  Problem Relation Age of Onset   Hypertension Mother    Stroke Sister    Seizures Sister    Colon cancer Maternal Aunt    Stomach cancer Maternal Uncle    Colon cancer Maternal Grandmother    Colon polyps Neg Hx    Esophageal cancer Neg Hx    Rectal cancer Neg Hx     Social History   Tobacco Use   Smoking status: Former    Packs/day: 0.75    Types: Cigarettes    Quit date: 11/17/1988    Years since quitting: 32.4   Smokeless tobacco: Never  Vaping Use   Vaping Use: Never used  Substance Use Topics   Alcohol use: No   Drug use: No    Home Medications Prior to Admission medications   Medication Sig Start Date  End Date Taking? Authorizing Provider  lidocaine (LIDODERM) 5 % Place 1 patch onto the skin daily. Remove & Discard patch within 12 hours or as directed by MD 04/11/21  Yes Harlene Salts A, PA-C  methocarbamol (ROBAXIN) 500 MG tablet Take 1 tablet (500 mg total) by mouth 2 (two) times daily. 04/11/21  Yes Harlene Salts A, PA-C  albuterol (VENTOLIN HFA) 108 (90 Base) MCG/ACT inhaler Inhale 1-2 puffs into the lungs every 4 (four) hours as needed for wheezing or shortness of breath. 03/14/21 03/14/22  Barbette Merino, NP  amLODipine (NORVASC) 10 MG tablet Take 1 tablet (10 mg total) by mouth daily. 02/09/21   Barbette Merino, NP  levocetirizine (XYZAL) 5 MG tablet Take 1 tablet (5 mg total) by mouth at bedtime. 03/14/21   Barbette Merino, NP  Multiple Vitamins-Minerals (MULTIVITAMIN WITH MINERALS) tablet Take 1 tablet by mouth daily.    [provider]   phentermine 37.5 MG capsule Take 1 capsule (37.5 mg total) by mouth daily. 11/23/20 12/23/20  Barbette Merino, NP  Spacer/Aero-Holding Chambers (AEROCHAMBER Z-STAT PLUS/MEDIUM) inhaler Use as instructed 04/10/19   Pearson Grippe, MD  SYMBICORT 80-4.5 MCG/ACT inhaler Inhale into the lungs. 07/01/20   [provider]  valACYclovir (VALTREX) 1000 MG tablet Take 1 tablet (1,000 mg total) by mouth 3 (three) times daily. 03/19/21   Haskel Schroeder, PA-C  zolpidem (AMBIEN) 10 MG tablet Take 1 tablet (10 mg total) by mouth at bedtime. 03/15/21   Barbette Merino, NP  cetirizine (ZYRTEC) 10 MG tablet Take 1 tablet (10 mg total) by mouth daily. 03/17/19 04/20/20  Massie Maroon, FNP    Allergies    Mobic [meloxicam] and Shrimp [shellfish allergy]  Review of Systems   Review of Systems  Constitutional: Negative.  Negative for chills and fever.  Gastrointestinal: Negative.  Negative for abdominal pain, nausea and vomiting.  Genitourinary: Negative.  Negative for dysuria and hematuria.  Musculoskeletal:  Positive for back pain. Negative for neck pain.  Neurological: Negative.  Negative for weakness and numbness.   Physical Exam Updated Vital Signs BP (!) 158/107 (BP Location: Left Arm) Comment: After multiple attempts  Pulse 69   Temp 97.8 F (36.6 C) (Oral)   Resp 18   Ht 5\' 2"  (1.575 m)   Wt 99.8 kg   SpO2 99%   BMI 40.24 kg/m   Physical Exam Constitutional:      General: She is not in acute distress.    Appearance: Normal appearance. She is well-developed. She is not ill-appearing or diaphoretic.  HENT:     Head: Normocephalic and atraumatic.  Eyes:     General: Vision grossly intact. Gaze aligned appropriately.     Pupils: Pupils are equal, round, and reactive to light.  Neck:     Trachea: Trachea and phonation normal.  Pulmonary:     Effort: Pulmonary effort is normal. No respiratory distress.  Abdominal:     General: There is no distension.     Palpations: Abdomen is  soft.     Tenderness: There is no abdominal tenderness. There is no guarding or rebound.  Musculoskeletal:        General: Normal range of motion.     Cervical back: Normal range of motion.     Comments: No midline C/T/L spinal tenderness to palpation, no deformity, crepitus, or step-off noted. No sign of injury to the neck or back.  Left lumbar paraspinal muscular tenderness palpation without overlying skin change.  Pain  increases with leftward rotation and flexion of lumbar spine.  Left leg raise.  5/5 strength bilateral hip flexion/extension, knee flexion/extension, dorsi/plantar flexion.  Capillary refill sensation intact to toes.  Strong equal pedal pulses.  Compartments soft.  DTR 2+ bilateral patella.  No clonus of the feet.  Skin:    General: Skin is warm and dry.  Neurological:     Mental Status: She is alert.     GCS: GCS eye subscore is 4. GCS verbal subscore is 5. GCS motor subscore is 6.     Comments: Speech is clear and goal oriented, follows commands Major Cranial nerves without deficit, no facial droop Moves extremities without ataxia, coordination intact  Psychiatric:        Behavior: Behavior normal.    ED Results / Procedures / Treatments   Labs (all labs ordered are listed, but only abnormal results are displayed) Labs Reviewed  POC URINE PREG, ED    EKG None  Radiology DG Lumbar Spine Complete  Result Date: 04/11/2021 CLINICAL DATA:  Low back pain EXAM: LUMBAR SPINE - COMPLETE 4+ VIEW COMPARISON:  CT abdomen/pelvis 12/17/2011 FINDINGS: There are 5 non-rib-bearing lumbar type vertebral bodies. Vertebral body heights are preserved. There is no evidence of acute fracture. Alignment is normal. The disc spaces are preserved. There is mild facet arthropathy in the lower lumbar spine. There is no spondylolysis. Right upper quadrant surgical clips are noted. The SI joints are intact. The soft tissues are unremarkable. IMPRESSION: Mild facet arthropathy in the lower  lumbar spine. Otherwise, unremarkable lumbar spine radiographs. Electronically Signed   By: Lesia Hausen M.D.   On: 04/11/2021 12:57    Procedures Procedures   Medications Ordered in ED Medications - No data to display  ED Course  I have reviewed the triage vital signs and the nursing notes.  Pertinent labs & imaging results that were available during my care of the patient were reviewed by me and considered in my medical decision making (see chart for details).    MDM Rules/Calculators/A&P                           Additional history obtained from: Nursing notes from this visit. Review or EMR. ------------- Heyli Min is a 50 y.o. female presenting with left low back pain with left-sided sciatica. Patient denies history of trauma, fever, IV drug use, night sweats, weight loss, cancer, saddle anesthesia, urinary rentention, bowel/bladder incontinence. No neurological deficits and normal neuro exam.  She has good strength with all movements of the bilateral lower extremities.  DTR 2+ bilateral patella, no clonus of the feet.  Suspect musculoskeletal etiology of patient's symptoms today, she requested x-ray which was ordered, radiologist interpretation below.  DG Lumbar spine:  IMPRESSION:  Mild facet arthropathy in the lower lumbar spine. Otherwise,  unremarkable lumbar spine radiographs.   I personally reviewed patient's lumbar spine x-rays and agree with radiologist interpretation above.  No evidence for acute fracture dislocation, no significant anterolisthesis. ------ Patient updated on findings above stated understanding.  Suspect musculoskeletal etiology patient symptoms today.  No red flag symptoms or concerning findings for cauda equina, AAA, spinal epidural abscess, dissection, kidney stone disease or other emergent causes of her symptoms.  Will treat with muscle relaxer, Lidoderm patches, OTC anti-inflammatories.  We will have patient follow-up with on-call orthopedist  Dr. Dion Saucier.  I discussed muscle relaxer precautions with the patient and she stated understanding.  Of note patient denies chance of  pregnancy today and deferred pregnancy testing.  Patient was made aware of elevated blood pressure.  She is asymptomatic regarding hypertension today, encouraged to have this rechecked by her PCP at follow-up visit this week.   At this time there does not appear to be any evidence of an acute emergency medical condition and the patient appears stable for discharge with appropriate outpatient follow up. Diagnosis was discussed with patient who verbalizes understanding of care plan and is agreeable to discharge. I have discussed return precautions with patient who verbalizes understanding. Patient encouraged to follow-up with their PCP and ortho. All questions answered. ==== At discharg nursing staff noted patient was hypertensive 186/113.  I reassessed the patient, she is resting comfortably no acute distress standing and asking to leave.  She denies any symptoms concerning for hypertensive urgency/emergency.  She reports she did not take her blood pressure medication this morning.  Suspect asymptomatic hypertension related to this along with her back pain.  I strongly encouraged patient take her blood pressure medication when she gets home and to return to the ER if she develops any signs or symptoms of hypertensive urgency/emergency which we discussed.  I encourage patient to have her blood pressure rechecked by her PCP this week.   Note: Portions of this report may have been transcribed using voice recognition software. Every effort was made to ensure accuracy; however, inadvertent computerized transcription errors may still be present.  Final Clinical Impression(s) / ED Diagnoses Final diagnoses:  Acute left-sided low back pain with left-sided sciatica  Elevated blood pressure reading    Rx / DC Orders ED Discharge Orders          Ordered    methocarbamol  (ROBAXIN) 500 MG tablet  2 times daily        04/11/21 1349    lidocaine (LIDODERM) 5 %  Every 24 hours        04/11/21 1349             Elizabeth Palau 04/11/21 1352    Ernie Avena, MD 04/11/21 1400    Harlene Salts A, PA-C 04/11/21 1414    Ernie Avena, MD 04/11/21 1439

## 2021-04-11 NOTE — ED Triage Notes (Signed)
Pt arrived via POV, left sided lower back pain, radiating down leg. No trauma .

## 2021-04-11 NOTE — Discharge Instructions (Signed)
At this time there does not appear to be the presence of an emergent medical condition, however there is always the potential for conditions to change. Please read and follow the below instructions.  Please return to the Emergency Department immediately for any new or worsening symptoms. Please be sure to follow up with your Primary Care Provider within one week regarding your visit today; please call their office to schedule an appointment even if you are feeling better for a follow-up visit. Your blood pressure was elevated today.  Please take your blood pressure medication as your primary care doctor prescribes and have them recheck it in the next week to ensure improvement. You may use the muscle relaxer Robaxin as prescribed to help with your symptoms.  Do not drive or operate heavy machinery while taking Robaxin as it will make you drowsy.  Do not drink alcohol or take other sedating medications while taking Robaxin as this will worsen side effects. You may use the Lidoderm patch as prescribed to help with your symptoms.  Lidoderm may be expensive so you may speak with your pharmacist about finding over-the-counter medications that work similarly. You may call the orthopedic specialist Dr. Dion Saucier at [facility] for follow-up appointment regarding your low back pain.  Go to the nearest Emergency Department immediately if: You have fever or chills You develop new bowel or bladder control problems. You have unusual weakness or numbness in your arms or legs. You feel faint. Get a very bad headache. Start to feel mixed up (confused). Feel weak or numb. Feel faint. Have very bad pain in your: Chest. Belly (abdomen). Throw up more than once. Have trouble breathing. You have any new/concerning or worsening of symptoms   Please read the additional information packets attached to your discharge summary.  Do not take your medicine if  develop an itchy rash, swelling in your  mouth or lips, or difficulty breathing; call 911 and seek immediate emergency medical attention if this occurs.  You may review your lab tests and imaging results in their entirety on your MyChart account.  Please discuss all results of fully with your primary care provider and other specialist at your follow-up visit.  Note: Portions of this text may have been transcribed using voice recognition software. Every effort was made to ensure accuracy; however, inadvertent computerized transcription errors may still be present.

## 2021-04-27 ENCOUNTER — Other Ambulatory Visit: Payer: Self-pay

## 2021-04-27 MED ORDER — ALBUTEROL SULFATE HFA 108 (90 BASE) MCG/ACT IN AERS: 1.0000 | INHALATION_SPRAY | RESPIRATORY_TRACT | 3 refills | Status: DC | PRN

## 2021-05-03 ENCOUNTER — Ambulatory Visit (INDEPENDENT_AMBULATORY_CARE_PROVIDER_SITE_OTHER): Payer: BC Managed Care – PPO | Admitting: Nurse Practitioner

## 2021-05-03 ENCOUNTER — Other Ambulatory Visit: Payer: Self-pay

## 2021-05-03 ENCOUNTER — Encounter: Payer: Self-pay | Admitting: Nurse Practitioner

## 2021-05-03 VITALS — BP 160/77 | HR 65 | Temp 98.0°F | Ht 62.0 in | Wt 215.0 lb

## 2021-05-03 DIAGNOSIS — J4520 Mild intermittent asthma, uncomplicated: Secondary | ICD-10-CM

## 2021-05-03 DIAGNOSIS — I10 Essential (primary) hypertension: Secondary | ICD-10-CM | POA: Diagnosis not present

## 2021-05-03 DIAGNOSIS — Z1231 Encounter for screening mammogram for malignant neoplasm of breast: Secondary | ICD-10-CM | POA: Diagnosis not present

## 2021-05-03 NOTE — Progress Notes (Signed)
[address] [place], [place]  [address] Phone:  [phone]   Fax:  [phone]   Established Patient Office Visit  Subjective:  Patient ID: Wendy James, female    DOB: October 30, 1970  Age: 50 y.o. MRN: [id number]  CC:  Chief Complaint  Patient presents with   Hypertension    Pt is here today for her hypertension follow up visit.      HPI [name] presents for follow up. She  has a past medical history of Allergy, Arthritis, Asthma, Bone spur of foot, Hypertension, and Seasonal allergies.   She is in today for follow-up for hypertension.  She reports that she has not taken her medication and ate some [place] food prior to her appointment.  She feels like this is why her blood pressure is slightly elevated. Denies headache, dizziness, visual changes, shortness of breath, dyspnea on exertion, chest pain, nausea, vomiting or any edema.   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   Asthma    Bone spur of foot    Hypertension    controlled    Seasonal allergies     Past Surgical History:  Procedure Laterality Date   CESAREAN SECTION     x 4   CHOLECYSTECTOMY     HEEL SPUR EXCISION     08/2017   HYSTEROSCOPY WITH D & C N/A 02/18/2015   Procedure: DILATATION AND CURETTAGE /HYSTEROSCOPY;  Surgeon: Emily Filbert, MD;  Location: [facility];  Service: Gynecology;  Laterality: N/A;   TUBAL LIGATION      Family History  Problem Relation Age of Onset   Hypertension Mother    Stroke Sister    Seizures Sister    Colon cancer Maternal Aunt    Stomach cancer Maternal Uncle    Colon cancer Maternal Grandmother    Colon polyps Neg Hx    Esophageal cancer Neg Hx    Rectal cancer Neg Hx     Social History   Socioeconomic History   Marital status: Married    Spouse name: Not on file   Number of children: Not on file   Years of education: Not on file   Highest education level: Not on file   Occupational History   Not on file  Tobacco Use   Smoking status: Former    Packs/day: 0.75    Types: Cigarettes    Quit date: 11/17/1988    Years since quitting: 32.4   Smokeless tobacco: Never  Vaping Use   Vaping Use: Never used  Substance and Sexual Activity   Alcohol use: No   Drug use: No   Sexual activity: Yes    Birth control/protection: Surgical  Other Topics Concern   Not on file  Social History Narrative   Not on file   Social Determinants of Health   Financial Resource Strain: Not on file  Food Insecurity: Not on file  Transportation Needs: Not on file  Physical Activity: Not on file  Stress: Not on file  Social Connections: Not on file  Intimate Partner Violence: Not on file    Outpatient Medications Prior to Visit  Medication Sig Dispense Refill   albuterol (VENTOLIN HFA) 108 (90 Base) MCG/ACT inhaler Inhale 1-2 puffs into the lungs every 4 (four) hours as needed for wheezing or shortness of breath. 8 g 3   amLODipine (NORVASC) 10 MG tablet Take 1 tablet (10  mg total) by mouth daily. 90 tablet 3   diclofenac (VOLTAREN) 75 MG EC tablet Take 75 mg by mouth 2 (two) times daily.     levocetirizine (XYZAL) 5 MG tablet Take 1 tablet (5 mg total) by mouth at bedtime. 30 tablet 1   Multiple Vitamins-Minerals (MULTIVITAMIN WITH MINERALS) tablet Take 1 tablet by mouth daily.     Spacer/Aero-Holding Chambers (AEROCHAMBER Z-STAT PLUS/MEDIUM) inhaler Use as instructed 1 each 2   zolpidem (AMBIEN) 10 MG tablet Take 1 tablet (10 mg total) by mouth at bedtime. 90 tablet 0   amitriptyline (ELAVIL) 25 MG tablet Take 25 mg by mouth at bedtime.     ketorolac (TORADOL) 30 MG/ML injection Inject 30 mg into the muscle once. (Patient not taking: Reported on 05/03/2021)     lidocaine (LIDODERM) 5 % Place 1 patch onto the skin daily. Remove & Discard patch within 12 hours or as directed by MD (Patient not taking: Reported on 05/03/2021) 15 patch 0   SYMBICORT 80-4.5 MCG/ACT inhaler  Inhale into the lungs. (Patient not taking: Reported on 05/03/2021)     triamcinolone acetonide (KENALOG-40) 40 MG/ML injection Inject 2 mLs into the muscle once. (Patient not taking: Reported on 05/03/2021)     valACYclovir (VALTREX) 1000 MG tablet Take 1 tablet (1,000 mg total) by mouth 3 (three) times daily. (Patient not taking: Reported on 05/03/2021) 21 tablet 0   methocarbamol (ROBAXIN) 500 MG tablet Take 1 tablet (500 mg total) by mouth 2 (two) times daily. (Patient not taking: Reported on 05/03/2021) 14 tablet 0   phentermine 37.5 MG capsule Take 1 capsule (37.5 mg total) by mouth daily. 30 capsule 0   No facility-administered medications prior to visit.    Allergies  Allergen Reactions   Mobic [Meloxicam] Hives    Pt reports she gets real bad hives from Mobic   Shrimp [Shellfish Allergy] Other (See Comments)    Wheezing.  Patient states she is ok with betadine    ROS Review of Systems    Objective:    Physical Exam Constitutional:      General: She is not in acute distress.    Appearance: She is obese. She is not toxic-appearing.  HENT:     Head: Normocephalic and atraumatic.     Nose: Nose normal.     Mouth/Throat:     Mouth: Mucous membranes are moist.  Cardiovascular:     Rate and Rhythm: Normal rate and regular rhythm.     Pulses: Normal pulses.     Heart sounds: Normal heart sounds.  Pulmonary:     Effort: Pulmonary effort is normal.     Breath sounds: Normal breath sounds.  Musculoskeletal:        General: Normal range of motion.     Cervical back: Normal range of motion.     Right lower leg: No edema.     Left lower leg: No edema.  Skin:    General: Skin is warm and dry.     Capillary Refill: Capillary refill takes less than 2 seconds.  Neurological:     General: No focal deficit present.     Mental Status: She is alert and oriented to person, place, and time.  Psychiatric:        Mood and Affect: Mood normal.        Behavior: Behavior normal.         Thought Content: Thought content normal.        Judgment: Judgment normal.  BP (!) 160/77   Pulse 65   Temp 98 F (36.7 C)   Ht $R'5\' 2"'tm$  (1.575 m)   Wt 215 lb (97.5 kg)   SpO2 98%   BMI 39.32 kg/m  Wt Readings from Last 3 Encounters:  05/03/21 215 lb (97.5 kg)  04/11/21 220 lb (99.8 kg)  03/19/21 215 lb (97.5 kg)     Health Maintenance Due  Topic Date Due   MAMMOGRAM  11/04/2018   Zoster Vaccines- Shingrix (1 of 2) Never done   COVID-19 Vaccine (3 - Booster for Pfizer series) 03/30/2020   PAP SMEAR-Modifier  09/30/2020    There are no preventive care reminders to display for this patient.  Lab Results  Component Value Date   TSH 0.396 (L) 09/26/2020   Lab Results  Component Value Date   WBC 4.6 06/21/2020   HGB 13.3 06/21/2020   HCT 40.7 06/21/2020   MCV 91.7 06/21/2020   PLT 229 06/21/2020   Lab Results  Component Value Date   NA 141 09/26/2020   K 3.7 09/26/2020   CO2 24 06/21/2020   GLUCOSE 90 09/26/2020   BUN 9 09/26/2020   CREATININE 0.68 09/26/2020   BILITOT 0.7 09/26/2020   ALKPHOS 91 09/26/2020   AST 26 09/26/2020   ALT 37 (H) 04/26/2020   PROT 6.8 09/26/2020   ALBUMIN 3.9 09/26/2020   CALCIUM 8.7 09/26/2020   ANIONGAP 11 06/21/2020   EGFR 105 09/26/2020   GFR 124.88 04/26/2020   Lab Results  Component Value Date   CHOL 150 04/26/2020   Lab Results  Component Value Date   HDL 53.60 04/26/2020   Lab Results  Component Value Date   LDLCALC 84 04/26/2020   Lab Results  Component Value Date   TRIG 63.0 04/26/2020   Lab Results  Component Value Date   CHOLHDL 3 04/26/2020   Lab Results  Component Value Date   HGBA1C 5.6 03/17/2019      Assessment & Plan:   Problem List Items Addressed This Visit       Cardiovascular and Mediastinum   Essential hypertension - Primary She declined labs today Encouraged on going compliance with current medication regimen Encouraged home monitoring and recording BP <130/80 Eating a  heart-healthy diet with less salt Encouraged regular physical activity  Recommend Weight loss     Other Visit Diagnoses     Mild intermittent asthma, uncomplicated       Relevant Medications   triamcinolone acetonide (KENALOG-40) 40 MG/ML injection   Screening mammogram, encounter for       Relevant Orders   MM Digital Screening       No orders of the defined types were placed in this encounter.   Follow-up: Return in about 3 months (around 08/03/2021) for asthma [id number], [place] [[id number]].    Vevelyn Francois, NP

## 2021-05-03 NOTE — Patient Instructions (Addendum)
[facility]  Breast Center Call [phone]  Asthma Action Plan, Adult An asthma action plan helps you understand how to manage your asthma and what to do when you have an asthma attack. The action plan is a color-coded plan that lists the symptoms that indicate whether or not your condition is under control and what actions to take. If you have symptoms in the green zone, you are doing well. If you have symptoms in the yellow zone, you are having problems. If you have symptoms in the red zone, you need medical care right away. Follow the plan that you and your health care provider develop. Review your plan with your health care provider at each visit. What triggers your asthma? Knowing the things that can trigger an asthma attack or make your asthma symptoms worse is very important. Talk to your health care provider about your asthma triggers and how to avoid them. Record your known asthma triggers here: _______________ What is your personal best peak flow reading? If you use a peak flow meter, determine your personal best reading. Record it here: _______________ [place] zone This zone means that your asthma is under control. You may not have any symptoms while you are in the green zone. This means that you: Have no coughing or wheezing, even while you are working or playing. Sleep through the night. Are breathing well. Have a peak flow reading that is above __________ (80% of your personal best or greater). If you are in the green zone, continue to manage your asthma as directed. Take these medicines every day: Controller medicine and dosage: _______________ Controller medicine and dosage: _______________ Controller medicine and dosage: _______________ Controller medicine and dosage: _______________ Before exercise, use this reliever or rescue medicine: _______________ Call your health care provider if you are using a reliever or rescue medicine more than 2-3 times a week. Yellow  zone Symptoms in this zone mean that your condition may be getting worse. You may have symptoms that interfere with exercise, are noticeably worse after exposure to triggers, or are worse at the first sign of a cold (upper respiratory infection). These may include: Waking from sleep. Coughing, especially at night or first thing in the morning. Mild wheezing. Chest tightness. A peak flow reading that is __________ to __________ (50-79% of your personal best). If you have any of these symptoms: Add the following medicine to the ones that you use daily: Reliever or rescue medicine and dosage: _______________ Additional medicine and dosage: _______________ Call your health care provider if: You remain in the yellow zone for __________ hours. You are using a reliever or rescue medicine more than 2-3 times a week. Red zone Symptoms in this zone mean that you should get medical help right away. You will likely feel distressed and have symptoms at rest that restrict your activity. You are in the red zone if: You are breathing hard and quickly. Your nose opens wide, your ribs show, and your neck muscles become visible when you breathe in. Your lips, fingers, or toes are a bluish color. You have trouble speaking in full sentences. Your peak flow reading is less than __________ (less than 50% of your personal best). Your symptoms do not improve within 15-20 minutes after you use your reliever or rescue medicine (bronchodilator). If you have any of these symptoms: These symptoms represent a serious problem that is an emergency. Do not wait to see if the symptoms will go away. Get medical help right away. Call your local emergency services (911  in the [place] Do not drive yourself to the hospital. Use your reliever or rescue medicine. Start a nebulizer treatment or take 2-4 puffs from a metered-dose inhaler with a spacer. Repeat this action every 15-20 minutes until help arrives. Where to find more  information You can find more information about asthma from: Centers for Disease Control and Prevention: FootballExhibition.com.br American Lung Association: www.lung.org This information is not intended to replace advice given to you by your health care provider. Make sure you discuss any questions you have with your health care provider. Document Revised: 09/08/2020 Document Reviewed: 09/08/2020 Elsevier Patient Education  2022 Elsevier Inc.  Health Maintenance, Female Adopting a healthy lifestyle and getting preventive care are important in promoting health and wellness. Ask your health care provider about: The right schedule for you to have regular tests and exams. Things you can do on your own to prevent diseases and keep yourself healthy. What should I know about diet, weight, and exercise? Eat a healthy diet  Eat a diet that includes plenty of vegetables, fruits, low-fat dairy products, and lean protein. Do not eat a lot of foods that are high in solid fats, added sugars, or sodium. Maintain a healthy weight Body mass index (BMI) is used to identify weight problems. It estimates body fat based on height and weight. Your health care provider can help determine your BMI and help you achieve or maintain a healthy weight. Get regular exercise Get regular exercise. This is one of the most important things you can do for your health. Most adults should: Exercise for at least 150 minutes each week. The exercise should increase your heart rate and make you sweat (moderate-intensity exercise). Do strengthening exercises at least twice a week. This is in addition to the moderate-intensity exercise. Spend less time sitting. Even light physical activity can be beneficial. Watch cholesterol and blood lipids Have your blood tested for lipids and cholesterol at 52 years of age, then have this test every 5 years. Have your cholesterol levels checked more often if: Your lipid or cholesterol levels are high. You  are older than 52 years of age. You are at high risk for heart disease. What should I know about cancer screening? Depending on your health history and family history, you may need to have cancer screening at various ages. This may include screening for: Breast cancer. Cervical cancer. Colorectal cancer. Skin cancer. Lung cancer. What should I know about heart disease, diabetes, and high blood pressure? Blood pressure and heart disease High blood pressure causes heart disease and increases the risk of stroke. This is more likely to develop in people who have high blood pressure readings, are of African descent, or are overweight. Have your blood pressure checked: Every 3-5 years if you are 48-31 years of age. Every year if you are 86 years old or older. Diabetes Have regular diabetes screenings. This checks your fasting blood sugar level. Have the screening done: Once every three years after age 65 if you are at a normal weight and have a low risk for diabetes. More often and at a younger age if you are overweight or have a high risk for diabetes. What should I know about preventing infection? Hepatitis B If you have a higher risk for hepatitis B, you should be screened for this virus. Talk with your health care provider to find out if you are at risk for hepatitis B infection. Hepatitis C Testing is recommended for: Everyone born from [birth year] through 1967. Anyone with known  risk factors for hepatitis C. Sexually transmitted infections (STIs) Get screened for STIs, including gonorrhea and chlamydia, if: You are sexually active and are younger than 52 years of age. You are older than 52 years of age and your health care provider tells you that you are at risk for this type of infection. Your sexual activity has changed since you were last screened, and you are at increased risk for chlamydia or gonorrhea. Ask your health care provider if you are at risk. Ask your health care provider about  whether you are at high risk for HIV. Your health care provider may recommend a prescription medicine to help prevent HIV infection. If you choose to take medicine to prevent HIV, you should first get tested for HIV. You should then be tested every 3 months for as long as you are taking the medicine. Pregnancy If you are about to stop having your period (premenopausal) and you may become pregnant, seek counseling before you get pregnant. Take 400 to 800 micrograms (mcg) of folic acid every day if you become pregnant. Ask for birth control (contraception) if you want to prevent pregnancy. Osteoporosis and menopause Osteoporosis is a disease in which the bones lose minerals and strength with aging. This can result in bone fractures. If you are 91 years old or older, or if you are at risk for osteoporosis and fractures, ask your health care provider if you should: Be screened for bone loss. Take a calcium or vitamin D supplement to lower your risk of fractures. Be given hormone replacement therapy (HRT) to treat symptoms of menopause. Follow these instructions at home: Lifestyle Do not use any products that contain nicotine or tobacco, such as cigarettes, e-cigarettes, and chewing tobacco. If you need help quitting, ask your health care provider. Do not use street drugs. Do not share needles. Ask your health care provider for help if you need support or information about quitting drugs. Alcohol use Do not drink alcohol if: Your health care provider tells you not to drink. You are pregnant, may be pregnant, or are planning to become pregnant. If you drink alcohol: Limit how much you use to 0-1 drink a day. Limit intake if you are breastfeeding. Be aware of how much alcohol is in your drink. In the [place] one drink equals one 12 oz bottle of beer (355 mL), one 5 oz glass of wine (148 mL), or one 1 oz glass of hard liquor (44 mL). General instructions Schedule regular health, dental, and eye  exams. Stay current with your vaccines. Tell your health care provider if: You often feel depressed. You have ever been abused or do not feel safe at home. Summary Adopting a healthy lifestyle and getting preventive care are important in promoting health and wellness. Follow your health care provider's instructions about healthy diet, exercising, and getting tested or screened for diseases. Follow your health care provider's instructions on monitoring your cholesterol and blood pressure. This information is not intended to replace advice given to you by your health care provider. Make sure you discuss any questions you have with your health care provider. Document Revised: 09/23/2020 Document Reviewed: 07/09/2018 Elsevier Patient Education  2022 [employer].

## 2021-08-03 ENCOUNTER — Ambulatory Visit: Payer: BC Managed Care – PPO | Admitting: Nurse Practitioner

## 2021-08-10 ENCOUNTER — Ambulatory Visit (INDEPENDENT_AMBULATORY_CARE_PROVIDER_SITE_OTHER): Payer: BC Managed Care – PPO | Admitting: Nurse Practitioner

## 2021-08-10 ENCOUNTER — Other Ambulatory Visit: Payer: Self-pay

## 2021-08-10 ENCOUNTER — Encounter: Payer: Self-pay | Admitting: Nurse Practitioner

## 2021-08-10 VITALS — BP 141/95 | HR 61 | Temp 98.0°F | Ht 62.0 in | Wt 215.8 lb

## 2021-08-10 DIAGNOSIS — I10 Essential (primary) hypertension: Secondary | ICD-10-CM | POA: Diagnosis not present

## 2021-08-10 DIAGNOSIS — G4700 Insomnia, unspecified: Secondary | ICD-10-CM | POA: Diagnosis not present

## 2021-08-10 DIAGNOSIS — Z13220 Encounter for screening for lipoid disorders: Secondary | ICD-10-CM | POA: Diagnosis not present

## 2021-08-10 DIAGNOSIS — R7989 Other specified abnormal findings of blood chemistry: Secondary | ICD-10-CM

## 2021-08-10 DIAGNOSIS — J4520 Mild intermittent asthma, uncomplicated: Secondary | ICD-10-CM | POA: Diagnosis not present

## 2021-08-10 MED ORDER — ZOLPIDEM TARTRATE 10 MG PO TABS: 10.0000 mg | ORAL_TABLET | Freq: Every day | ORAL | 0 refills | Status: DC

## 2021-08-10 MED ORDER — AMLODIPINE-OLMESARTAN 10-40 MG PO TABS: 1.0000 | ORAL_TABLET | Freq: Every day | ORAL | 3 refills | Status: DC

## 2021-08-10 NOTE — Progress Notes (Signed)
[address] [place], [place]  [address] Phone:  [phone]   Fax:  [phone]   Established Patient Office Visit  Subjective:  Patient ID: Wendy James, female    DOB: 15-Sep-1970  Age: 51 y.o. MRN: [id number]  CC:  Chief Complaint  Patient presents with   Follow-up    Pt is here today for her 3 month follow up visit and would like to reschedule her physical and pap smear exam. Pt state that she has been having severe headaches x 1 month w/o vision problems and dizziness. Pt would also like to discuss trying another blood pressure medication due to her blood pressure still being elevated.    HPI Wendy James presents for follow up. She  has a past medical history of Allergy, Arthritis, Asthma, Bone spur of foot, Hypertension, and Seasonal allergies.   Hypertension Patient is here for follow-up of elevated blood pressure. She is exercising and is adherent to a low-salt diet. Blood pressure is not well controlled at home. Cardiac symptoms:  headache . Patient denies chest pain, claudication, exertional chest pressure/discomfort, irregular heart beat, lower extremity edema, near-syncope, orthopnea, palpitations, paroxysmal nocturnal dyspnea, syncope, and tachypnea. Cardiovascular risk factors: hypertension and obesity (BMI >= 30 kg/m2). She reports a strong family history of uncontrolled HTN. Use of agents associated with hypertension: none. History of target organ damage: none.  She is no longer taking her anxiety medication. She is doing well. She does continue with the use the Ambien.   She reports that her asthma is doing well.  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   Asthma    Bone spur of foot    Hypertension    controlled    Seasonal allergies     Past Surgical History:  Procedure Laterality Date   CESAREAN SECTION     x 4   CHOLECYSTECTOMY     HEEL SPUR EXCISION     08/2017    HYSTEROSCOPY WITH D & C N/A 02/18/2015   Procedure: DILATATION AND CURETTAGE /HYSTEROSCOPY;  Surgeon: Emily Filbert, MD;  Location: [facility];  Service: Gynecology;  Laterality: N/A;   TUBAL LIGATION      Family History  Problem Relation Age of Onset   Hypertension Mother    Stroke Sister    Seizures Sister    Colon cancer Maternal Aunt    Stomach cancer Maternal Uncle    Colon cancer Maternal Grandmother    Colon polyps Neg Hx    Esophageal cancer Neg Hx    Rectal cancer Neg Hx     Social History   Socioeconomic History   Marital status: Single    Spouse name: Not on file   Number of children: Not on file   Years of education: Not on file   Highest education level: Not on file  Occupational History   Not on file  Tobacco Use   Smoking status: Former    Packs/day: 0.75    Types: Cigarettes    Quit date: 11/17/1988    Years since quitting: 32.7   Smokeless tobacco: Never  Vaping Use   Vaping Use: Never used  Substance and Sexual Activity   Alcohol use: No   Drug use: No   Sexual activity: Yes    Birth control/protection: Surgical  Other Topics Concern   Not on file  Social History Narrative   Not on  file   Social Determinants of Health   Financial Resource Strain: Not on file  Food Insecurity: Not on file  Transportation Needs: Not on file  Physical Activity: Not on file  Stress: Not on file  Social Connections: Not on file  Intimate Partner Violence: Not on file    Outpatient Medications Prior to Visit  Medication Sig Dispense Refill   albuterol (VENTOLIN HFA) 108 (90 Base) MCG/ACT inhaler Inhale 1-2 puffs into the lungs every 4 (four) hours as needed for wheezing or shortness of breath. 8 g 3   levocetirizine (XYZAL) 5 MG tablet Take 1 tablet (5 mg total) by mouth at bedtime. 30 tablet 1   amLODipine (NORVASC) 10 MG tablet Take 1 tablet (10 mg total) by mouth daily. 90 tablet 3   zolpidem (AMBIEN) 10 MG tablet Take 1 tablet (10 mg total) by mouth at bedtime.  90 tablet 0   diclofenac (VOLTAREN) 75 MG EC tablet Take 75 mg by mouth 2 (two) times daily. (Patient not taking: Reported on 08/10/2021)     ketorolac (TORADOL) 30 MG/ML injection Inject 30 mg into the muscle once. (Patient not taking: Reported on 05/03/2021)     lidocaine (LIDODERM) 5 % Place 1 patch onto the skin daily. Remove & Discard patch within 12 hours or as directed by MD (Patient not taking: Reported on 05/03/2021) 15 patch 0   Multiple Vitamins-Minerals (MULTIVITAMIN WITH MINERALS) tablet Take 1 tablet by mouth daily. (Patient not taking: Reported on 08/10/2021)     Spacer/Aero-Holding Chambers (AEROCHAMBER Z-STAT PLUS/MEDIUM) inhaler Use as instructed (Patient not taking: Reported on 08/10/2021) 1 each 2   SYMBICORT 80-4.5 MCG/ACT inhaler Inhale into the lungs. (Patient not taking: Reported on 05/03/2021)     triamcinolone acetonide (KENALOG-40) 40 MG/ML injection Inject 2 mLs into the muscle once. (Patient not taking: Reported on 05/03/2021)     valACYclovir (VALTREX) 1000 MG tablet Take 1 tablet (1,000 mg total) by mouth 3 (three) times daily. (Patient not taking: Reported on 05/03/2021) 21 tablet 0   No facility-administered medications prior to visit.    Allergies  Allergen Reactions   Mobic [Meloxicam] Hives    Pt reports she gets real bad hives from Mobic   Shrimp [Shellfish Allergy] Other (See Comments)    Wheezing.  Patient states she is ok with betadine    ROS Review of Systems    Objective:    Physical Exam Constitutional:      Appearance: She is obese.  HENT:     Head: Normocephalic and atraumatic.     Nose: Nose normal.     Mouth/Throat:     Mouth: Mucous membranes are moist.  Cardiovascular:     Rate and Rhythm: Normal rate and regular rhythm.     Pulses: Normal pulses.     Heart sounds: Normal heart sounds.  Pulmonary:     Effort: Pulmonary effort is normal.     Comments: Diminished  Abdominal:     Palpations: Abdomen is soft.  Musculoskeletal:         General: Normal range of motion.     Cervical back: Normal range of motion.     Right lower leg: No edema.     Left lower leg: No edema.  Skin:    General: Skin is warm and dry.     Capillary Refill: Capillary refill takes less than 2 seconds.  Neurological:     General: No focal deficit present.     Mental Status: She is alert  and oriented to person, place, and time.  Psychiatric:        Mood and Affect: Mood normal.        Behavior: Behavior normal.        Thought Content: Thought content normal.        Judgment: Judgment normal.    BP (!) 141/95 (BP Location: Left Arm, Cuff Size: Large)    Pulse 61    Temp 98 F (36.7 C)    Ht 5\' 2"  (1.575 m)    Wt 215 lb 12.8 oz (97.9 kg)    SpO2 98%    BMI 39.47 kg/m  Wt Readings from Last 3 Encounters:  08/10/21 215 lb 12.8 oz (97.9 kg)  05/03/21 215 lb (97.5 kg)  04/11/21 220 lb (99.8 kg)     Health Maintenance Due  Topic Date Due   MAMMOGRAM  11/04/2018    There are no preventive care reminders to display for this patient.  Lab Results  Component Value Date   TSH 0.396 (L) 09/26/2020   Lab Results  Component Value Date   WBC 4.6 06/21/2020   HGB 13.3 06/21/2020   HCT 40.7 06/21/2020   MCV 91.7 06/21/2020   PLT 229 06/21/2020   Lab Results  Component Value Date   NA 141 09/26/2020   K 3.7 09/26/2020   CO2 24 06/21/2020   GLUCOSE 90 09/26/2020   BUN 9 09/26/2020   CREATININE 0.68 09/26/2020   BILITOT 0.7 09/26/2020   ALKPHOS 91 09/26/2020   AST 26 09/26/2020   ALT 37 (H) 04/26/2020   PROT 6.8 09/26/2020   ALBUMIN 3.9 09/26/2020   CALCIUM 8.7 09/26/2020   ANIONGAP 11 06/21/2020   EGFR 105 09/26/2020   GFR 124.88 04/26/2020   Lab Results  Component Value Date   CHOL 150 04/26/2020   Lab Results  Component Value Date   HDL 53.60 04/26/2020   Lab Results  Component Value Date   LDLCALC 84 04/26/2020   Lab Results  Component Value Date   TRIG 63.0 04/26/2020   Lab Results  Component Value Date    CHOLHDL 3 04/26/2020   Lab Results  Component Value Date   HGBA1C 5.6 03/17/2019      Assessment & Plan:   Problem List Items Addressed This Visit       Cardiovascular and Mediastinum   Essential hypertension - Primary Worsening Added olmesartan to current amlodipine; Azor 10/40 mg daily Encouraged on going compliance with current medication regimen Encouraged home monitoring and recording BP <130/80 follow-up if blood pressures remain elevated or any side effects of new medication Eating a heart-healthy diet with less salt Encouraged regular physical activity  Recommend Weight loss     Relevant Medications   amLODipine-olmesartan (AZOR) 10-40 MG tablet   Other Relevant Orders   Comp. Metabolic Panel (12)   Other Visit Diagnoses     Mild intermittent asthma, uncomplicated     Stable Continue with current regimen   Insomnia, unspecified type     Stable Continue with current regimen   Relevant Medications   zolpidem (AMBIEN) 10 MG tablet   Screening for cholesterol level       Relevant Orders   Lipid panel   Abnormal TSH    Reevaluation of TSH   Relevant Orders   TSH       Meds ordered this encounter  Medications   amLODipine-olmesartan (AZOR) 10-40 MG tablet    Sig: Take 1 tablet by mouth daily.  Dispense:  90 tablet    Refill:  3    Order Specific Question:   Supervising Provider    Answer:   Tresa Garter [[id number]]   zolpidem (AMBIEN) 10 MG tablet    Sig: Take 1 tablet (10 mg total) by mouth at bedtime.    Dispense:  90 tablet    Refill:  0    Order Specific Question:   Supervising Provider    Answer:   Tresa Garter [id number]    Follow-up: Return in about 4 weeks (around 09/07/2021) for Follow up HTN 52481.    Vevelyn Francois, NP

## 2021-08-10 NOTE — Patient Instructions (Signed)
Managing Your Hypertension Hypertension, also called high blood pressure, is when the force of the blood pressing against the walls of the arteries is too strong. Arteries are blood vessels that carry blood from your heart throughout your body. Hypertension forces the heart to work harder to pump blood and may cause the arteries to become narrow or stiff. Understanding blood pressure readings Your personal target blood pressure may vary depending on your medical conditions, your age, and other factors. A blood pressure reading includes a higher number over a lower number. Ideally, your blood pressure should be below 120/80. You should know that: The first, or top, number is called the systolic pressure. It is a measure of the pressure in your arteries as your heart beats. The second, or bottom number, is called the diastolic pressure. It is a measure of the pressure in your arteries as the heart relaxes. Blood pressure is classified into four stages. Based on your blood pressure reading, your health care provider may use the following stages to determine what type of treatment you need, if any. Systolic pressure and diastolic pressure are measured in a unit called mmHg. Normal Systolic pressure: below 120. Diastolic pressure: below 80. Elevated Systolic pressure: 120-129. Diastolic pressure: below 80. Hypertension stage 1 Systolic pressure: 130-139. Diastolic pressure: 80-89. Hypertension stage 2 Systolic pressure: 140 or above. Diastolic pressure: 90 or above. How can this condition affect me? Managing your hypertension is an important responsibility. Over time, hypertension can damage the arteries and decrease blood flow to important parts of the body, including the brain, heart, and kidneys. Having untreated or uncontrolled hypertension can lead to: A heart attack. A stroke. A weakened blood vessel (aneurysm). Heart failure. Kidney damage. Eye damage. Metabolic syndrome. Memory and  concentration problems. Vascular dementia. What actions can I take to manage this condition? Hypertension can be managed by making lifestyle changes and possibly by taking medicines. Your health care provider will help you make a plan to bring your blood pressure within a normal range. Nutrition  Eat a diet that is high in fiber and potassium, and low in salt (sodium), added sugar, and fat. An example eating plan is called the Dietary Approaches to Stop Hypertension (DASH) diet. To eat this way: Eat plenty of fresh fruits and vegetables. Try to fill one-half of your plate at each meal with fruits and vegetables. Eat whole grains, such as whole-wheat pasta, brown rice, or whole-grain bread. Fill about one-fourth of your plate with whole grains. Eat low-fat dairy products. Avoid fatty cuts of meat, processed or cured meats, and poultry with skin. Fill about one-fourth of your plate with lean proteins such as fish, chicken without skin, beans, eggs, and tofu. Avoid pre-made and processed foods. These tend to be higher in sodium, added sugar, and fat. Reduce your daily sodium intake. Most people with hypertension should eat less than 1,500 mg of sodium a day. Lifestyle  Work with your health care provider to maintain a healthy body weight or to lose weight. Ask what an ideal weight is for you. Get at least 30 minutes of exercise that causes your heart to beat faster (aerobic exercise) most days of the week. Activities may include walking, swimming, or biking. Include exercise to strengthen your muscles (resistance exercise), such as weight lifting, as part of your weekly exercise routine. Try to do these types of exercises for 30 minutes at least 3 days a week. Do not use any products that contain nicotine or tobacco, such as cigarettes, e-cigarettes,   and chewing tobacco. If you need help quitting, ask your health care provider. Control any long-term (chronic) conditions you have, such as high  cholesterol or diabetes. Identify your sources of stress and find ways to manage stress. This may include meditation, deep breathing, or making time for fun activities. Alcohol use Do not drink alcohol if: Your health care provider tells you not to drink. You are pregnant, may be pregnant, or are planning to become pregnant. If you drink alcohol: Limit how much you use to: 0-1 drink a day for women. 0-2 drinks a day for men. Be aware of how much alcohol is in your drink. In the [place] one drink equals one 12 oz bottle of beer (355 mL), one 5 oz glass of wine (148 mL), or one 1 oz glass of hard liquor (44 mL). Medicines Your health care provider may prescribe medicine if lifestyle changes are not enough to get your blood pressure under control and if: Your systolic blood pressure is 130 or higher. Your diastolic blood pressure is 80 or higher. Take medicines only as told by your health care provider. Follow the directions carefully. Blood pressure medicines must be taken as told by your health care provider. The medicine does not work as well when you skip doses. Skipping doses also puts you at risk for problems. Monitoring Before you monitor your blood pressure: Do not smoke, drink caffeinated beverages, or exercise within 30 minutes before taking a measurement. Use the bathroom and empty your bladder (urinate). Sit quietly for at least 5 minutes before taking measurements. Monitor your blood pressure at home as told by your health care provider. To do this: Sit with your back straight and supported. Place your feet flat on the floor. Do not cross your legs. Support your arm on a flat surface, such as a table. Make sure your upper arm is at heart level. Each time you measure, take two or three readings one minute apart and record the results. You may also need to have your blood pressure checked regularly by your health care provider. General information Talk with your health care  provider about your diet, exercise habits, and other lifestyle factors that may be contributing to hypertension. Review all the medicines you take with your health care provider because there may be side effects or interactions. Keep all visits as told by your health care provider. Your health care provider can help you create and adjust your plan for managing your high blood pressure. Where to find more information National Heart, Lung, and Blood Institute: www.nhlbi.nih.gov American Heart Association: www.heart.org Contact a health care provider if: You think you are having a reaction to medicines you have taken. You have repeated (recurrent) headaches. You feel dizzy. You have swelling in your ankles. You have trouble with your vision. Get help right away if: You develop a severe headache or confusion. You have unusual weakness or numbness, or you feel faint. You have severe pain in your chest or abdomen. You vomit repeatedly. You have trouble breathing. These symptoms may represent a serious problem that is an emergency. Do not wait to see if the symptoms will go away. Get medical help right away. Call your local emergency services (911 in the [place] Do not drive yourself to the hospital. Summary Hypertension is when the force of blood pumping through your arteries is too strong. If this condition is not controlled, it may put you at risk for serious complications. Your personal target blood pressure may vary depending on   your medical conditions, your age, and other factors. For most people, a normal blood pressure is less than 120/80. Hypertension is managed by lifestyle changes, medicines, or both. Lifestyle changes to help manage hypertension include losing weight, eating a healthy, low-sodium diet, exercising more, stopping smoking, and limiting alcohol. This information is not intended to replace advice given to you by your health care provider. Make sure you discuss any questions  you have with your health care provider. Document Revised: 08/03/2019 Document Reviewed: 06/16/2019 Elsevier Patient Education  2022 [employer] Inc.  

## 2021-08-11 NOTE — Addendum Note (Signed)
Addended by: Lindley Magnus L on: 08/11/2021 01:55 PM   Modules accepted: Orders

## 2021-08-26 ENCOUNTER — Encounter (HOSPITAL_COMMUNITY): Payer: Self-pay | Admitting: Emergency Medicine

## 2021-08-26 ENCOUNTER — Emergency Department (HOSPITAL_COMMUNITY)
Admission: EM | Admit: 2021-08-26 | Discharge: 2021-08-26 | Disposition: A | Discharge status: Home / Self Care | Payer: BC Managed Care – PPO | Attending: Emergency Medicine | Admitting: Emergency Medicine

## 2021-08-26 ENCOUNTER — Other Ambulatory Visit: Payer: Self-pay

## 2021-08-26 ENCOUNTER — Emergency Department (HOSPITAL_COMMUNITY): Payer: BC Managed Care – PPO

## 2021-08-26 DIAGNOSIS — I10 Essential (primary) hypertension: Secondary | ICD-10-CM | POA: Insufficient documentation

## 2021-08-26 DIAGNOSIS — R519 Headache, unspecified: Secondary | ICD-10-CM | POA: Insufficient documentation

## 2021-08-26 DIAGNOSIS — G8929 Other chronic pain: Secondary | ICD-10-CM

## 2021-08-26 LAB — CBC WITH DIFFERENTIAL/PLATELET
Abs Immature Granulocytes: 0.01 10*3/uL (ref 0.00–0.07)
Basophils Absolute: 0 10*3/uL (ref 0.0–0.1)
Basophils Relative: 1 %
Eosinophils Absolute: 0.2 10*3/uL (ref 0.0–0.5)
Eosinophils Relative: 3 %
HCT: 39.1 % (ref 36.0–46.0)
Hemoglobin: 12.8 g/dL (ref 12.0–15.0)
Immature Granulocytes: 0 %
Lymphocytes Relative: 44 %
Lymphs Abs: 2.1 10*3/uL (ref 0.7–4.0)
MCH: 30.3 pg (ref 26.0–34.0)
MCHC: 32.7 g/dL (ref 30.0–36.0)
MCV: 92.4 fL (ref 80.0–100.0)
Monocytes Absolute: 0.5 10*3/uL (ref 0.1–1.0)
Monocytes Relative: 11 %
Neutro Abs: 1.9 10*3/uL (ref 1.7–7.7)
Neutrophils Relative %: 41 %
Platelets: 195 10*3/uL (ref 150–400)
RBC: 4.23 MIL/uL (ref 3.87–5.11)
RDW: 12.5 % (ref 11.5–15.5)
WBC: 4.7 10*3/uL (ref 4.0–10.5)
nRBC: 0 % (ref 0.0–0.2)

## 2021-08-26 LAB — BASIC METABOLIC PANEL
Anion gap: 6 (ref 5–15)
BUN: 17 mg/dL (ref 6–20)
CO2: 27 mmol/L (ref 22–32)
Calcium: 8.7 mg/dL — ABNORMAL LOW (ref 8.9–10.3)
Chloride: 106 mmol/L (ref 98–111)
Creatinine, Ser: 0.71 mg/dL (ref 0.44–1.00)
GFR, Estimated: >60 mL/min (ref >60)
Glucose, Bld: 105 mg/dL — ABNORMAL HIGH (ref 70–99)
Potassium: 3.4 mmol/L — ABNORMAL LOW (ref 3.5–5.1)
Sodium: 139 mmol/L (ref 135–145)

## 2021-08-26 MED ORDER — DIPHENHYDRAMINE HCL 50 MG/ML IJ SOLN: 25.0000 mg | Freq: Once | INTRAMUSCULAR | Status: DC

## 2021-08-26 MED ORDER — DIPHENHYDRAMINE HCL 50 MG/ML IJ SOLN
25.0000 mg | Freq: Once | INTRAMUSCULAR | Status: AC
  Administered 2021-08-26: 25 mg via INTRAVENOUS
  Filled 2021-08-26: qty 1

## 2021-08-26 MED ORDER — ACETAMINOPHEN 500 MG PO TABS
1000.0000 mg | ORAL_TABLET | Freq: Once | ORAL | Status: AC
  Administered 2021-08-26: 1000 mg via ORAL
  Filled 2021-08-26: qty 2

## 2021-08-26 NOTE — Discharge Instructions (Signed)
Return for any problem.  ?

## 2021-08-26 NOTE — ED Triage Notes (Addendum)
Patient reports headache which started today around 4am. She reports the pain is bilateral in the temple area. She reports hx of HTN, and has been taking her medications as prescribed. She reports her vision is not blurry but reports seeing red spots intermittently.

## 2021-08-26 NOTE — ED Provider Notes (Signed)
[name] Provider Note   CSN: [id number] Arrival date & time: 08/26/21  1743     History  Chief Complaint  Patient presents with   Headache    Wendy James is a 51 y.o. female.  51 year old female with prior medical history as detailed below presents for evaluation.  Patient complains of chronic headache that has been ongoing for the last at least 6 months.  Patient reports nearly daily headaches.  She reports that they will improve somewhat with use of Tylenol.  She complains of persistent hypertension.  She reports compliance with previously prescribed antihypertensives.  She reports that she has discussed her headaches with her PCP.  No specific plan has been given to the patient regarding work-up or treatment of same.  She denies associated fever, visual change, nausea, vomiting, weakness, or other acute complaint.  The history is provided by the patient and medical records.  Headache Pain location:  Generalized Quality:  Sharp and dull Radiates to:  Does not radiate Onset quality:  Gradual Duration: 6 months. Timing:  Constant Progression:  Waxing and waning Chronicity:  Chronic     Home Medications Prior to Admission medications   Medication Sig Start Date End Date Taking? Authorizing Provider  albuterol (VENTOLIN HFA) 108 (90 Base) MCG/ACT inhaler Inhale 1-2 puffs into the lungs every 4 (four) hours as needed for wheezing or shortness of breath. 04/27/21 04/27/22  Vevelyn Francois, NP  amLODipine-olmesartan (AZOR) 10-40 MG tablet Take 1 tablet by mouth daily. 08/10/21   Vevelyn Francois, NP  diclofenac (VOLTAREN) 75 MG EC tablet Take 75 mg by mouth 2 (two) times daily. Patient not taking: Reported on 08/10/2021 04/17/21   [provider]  ketorolac (TORADOL) 30 MG/ML injection Inject 30 mg into the muscle once. Patient not taking: Reported on 05/03/2021 04/17/21   [provider]  levocetirizine (XYZAL) 5 MG  tablet Take 1 tablet (5 mg total) by mouth at bedtime. 03/14/21   Vevelyn Francois, NP  zolpidem (AMBIEN) 10 MG tablet Take 1 tablet (10 mg total) by mouth at bedtime. 08/10/21   Vevelyn Francois, NP  cetirizine (ZYRTEC) 10 MG tablet Take 1 tablet (10 mg total) by mouth daily. 03/17/19 04/20/20  Dorena Dew, FNP      Allergies    Mobic [meloxicam] and Shrimp [shellfish allergy]    Review of Systems   Review of Systems  Neurological:  Positive for headaches.  All other systems reviewed and are negative.  Physical Exam Updated Vital Signs BP 133/81    Pulse 62    Temp 98.8 F (37.1 C) (Oral)    Resp 18    SpO2 97%  Physical Exam Vitals and nursing note reviewed.  Constitutional:      General: She is not in acute distress.    Appearance: Normal appearance. She is well-developed.  HENT:     Head: Normocephalic and atraumatic.  Eyes:     General: No visual field deficit.    Conjunctiva/sclera: Conjunctivae normal.     Pupils: Pupils are equal, round, and reactive to light.  Cardiovascular:     Rate and Rhythm: Normal rate and regular rhythm.     Heart sounds: Normal heart sounds.  Pulmonary:     Effort: Pulmonary effort is normal. No respiratory distress.     Breath sounds: Normal breath sounds.  Abdominal:     General: There is no distension.     Palpations: Abdomen is soft.  Tenderness: There is no abdominal tenderness.  Musculoskeletal:        General: No deformity. Normal range of motion.     Cervical back: Normal range of motion and neck supple.  Skin:    General: Skin is warm and dry.  Neurological:     General: No focal deficit present.     Mental Status: She is alert and oriented to person, place, and time. Mental status is at baseline.     GCS: GCS eye subscore is 4. GCS verbal subscore is 5. GCS motor subscore is 6.     Cranial Nerves: No cranial nerve deficit, dysarthria or facial asymmetry.     Sensory: No sensory deficit.     Motor: No weakness.      Coordination: Romberg sign negative. Coordination normal.    ED Results / Procedures / Treatments   Labs (all labs ordered are listed, but only abnormal results are displayed) Labs Reviewed  BASIC METABOLIC PANEL - Abnormal; Notable for the following components:      Result Value   Potassium 3.4 (*)    Glucose, Bld 105 (*)    Calcium 8.7 (*)    All other components within normal limits  CBC WITH DIFFERENTIAL/PLATELET    EKG None  Radiology CT Head Wo Contrast  Result Date: 08/26/2021 CLINICAL DATA:  Headache, new or worsening (Age >= 50y) EXAM: CT HEAD WITHOUT CONTRAST TECHNIQUE: Contiguous axial images were obtained from the base of the skull through the vertex without intravenous contrast. RADIATION DOSE REDUCTION: This exam was performed according to the departmental dose-optimization program which includes automated exposure control, adjustment of the mA and/or kV according to patient size and/or use of iterative reconstruction technique. COMPARISON:  May 19, 2014 FINDINGS: Brain: No evidence of acute infarction, hemorrhage, hydrocephalus, extra-axial collection or mass lesion/mass effect. Unchanged subcortical white matter hypodensity in the LEFT frontoparietal lobe. Vascular: No hyperdense vessel or unexpected calcification. Skull: Normal. Negative for fracture or focal lesion. Sinuses/Orbits: Polypoid mucosal thickening of the medial LEFT maxillary sinus. Other: None. IMPRESSION: No acute intracranial abnormality. Electronically Signed   By: Meda KlinefelterStephanie  Peacock M.D.   On: 08/26/2021 18:35    Procedures Procedures    Medications Ordered in ED Medications  acetaminophen (TYLENOL) tablet 1,000 mg (1,000 mg Oral Given 08/26/21 1925)  diphenhydrAMINE (BENADRYL) injection 25 mg (25 mg Intravenous Given 08/26/21 1925)    ED Course/ Medical Decision Making/ A&P                           Medical Decision Making Risk OTC drugs. Prescription drug management.    Medical Screen  Complete  This patient presented to the ED with complaint of chronic headache.  This complaint involves an extensive number of treatment options. The initial differential diagnosis includes, but is not limited to, chronic headache, chronic migraine, acute intercranial process, metabolic abnormality, uncontrolled hypertension. etc  This presentation is: Chronic, Previously Undiagnosed, Uncertain Prognosis, Complicated, Systemic Symptoms, and Threat to Life/Bodily Function  Presents with complaint of chronic headache.  Patient's describe symptoms have been ongoing for several months at least.  Patient is concerned that her blood pressure is poorly controlled. She reports full compliance with previously prescribed antihypertensives.  For the majority of her ED evaluation today her blood pressure is well controlled with systolics in the 120s to 130s and diastolics in the 80s.  Imaging obtained during work-up today is without acute abnormality.  Screening labs obtained are without  significant abnormality.  Patient feels significantly improved with administration of Tylenol and Benadryl.  Patient without suggestion of more significant acute pathology that requires additional ED work-up.  She is advised to close follow-up with her PCP and also with neurology for further outpatient work-up be appropriate.  Importance of need for close follow-up is understood.  Strict return precautions given understood.  Co morbidities that complicated the patient's evaluation  HTN   Additional history obtained:  Additional history obtained from Spouse External records from outside sources obtained and reviewed including prior ED visits and prior Inpatient records.    Lab Tests:  I ordered and personally interpreted labs.  The pertinent results include:  CBC BMP   Imaging Studies ordered:  I ordered imaging studies including ct head  I independently visualized and interpreted obtained imaging  which showed NAD I agree with the radiologist interpretation.   Cardiac Monitoring:  The patient was maintained on a cardiac monitor.  I personally viewed and interpreted the cardiac monitor which showed an underlying rhythm of: NSR   Medicines ordered:  I ordered medication including acetaminophen / benadryl  for headache pain  Reevaluation of the patient after these medicines showed that the patient: improved      Problem List / ED Course:  Headache, chronic    Reevaluation:  After the interventions noted above, I reevaluated the patient and found that they have: improved    Disposition:  After consideration of the diagnostic results and the patients response to treatment, I feel that the patent would benefit from close outpatient follow-up.          Final Clinical Impression(s) / ED Diagnoses Final diagnoses:  Chronic nonintractable headache, unspecified headache type    Rx / DC Orders ED Discharge Orders     None         Valarie Merino, MD 08/26/21 2116

## 2021-08-26 NOTE — ED Provider Triage Note (Signed)
Emergency Medicine Provider Triage Evaluation Note  Wendy James , a 51 y.o. female  was evaluated in triage.  Pt complains of headache.  Her pain woke her up at 4 am.  She hasn't had any relief with eating. She reports pain in both her temples.  She denies blurry vision, but reports red spots from both eyes that last for a few seconds.  She reports compliance with her blood pressure meds. She reports her BP is normally lower than this.  She reports photophobia. No history of migraines.  No N/V  Physical Exam  BP (!) 167/103 (BP Location: Right Arm)    Pulse 76    Temp 98.8 F (37.1 C) (Oral)    Resp 18    SpO2 99%  Gen:   Awake, no distress   Resp:  Normal effort  MSK:   Moves extremities without difficulty  Other:  Normal speech.   Medical Decision Making  Medically screening exam initiated at 6:05 PM.  Appropriate orders placed.  Rebekka Lobello was informed that the remainder of the evaluation will be completed by another provider, this initial triage assessment does not replace that evaluation, and the importance of remaining in the ED until their evaluation is complete.     Cristina Gong, [place] 08/26/21 1809

## 2021-08-29 ENCOUNTER — Telehealth: Payer: Self-pay

## 2021-08-29 NOTE — Telephone Encounter (Signed)
Ibuprofen 800 mg to be refill due to headaches

## 2021-08-30 ENCOUNTER — Other Ambulatory Visit: Payer: Self-pay | Admitting: Nurse Practitioner

## 2021-08-30 MED ORDER — IBUPROFEN 800 MG PO TABS: 800.0000 mg | ORAL_TABLET | Freq: Three times a day (TID) | ORAL | 0 refills | Status: DC | PRN

## 2021-08-30 NOTE — Telephone Encounter (Signed)
The refill has been sent. Thanks  ° °

## 2021-09-07 ENCOUNTER — Ambulatory Visit (INDEPENDENT_AMBULATORY_CARE_PROVIDER_SITE_OTHER): Payer: BC Managed Care – PPO | Admitting: Nurse Practitioner

## 2021-09-07 ENCOUNTER — Encounter: Payer: Self-pay | Admitting: Nurse Practitioner

## 2021-09-07 ENCOUNTER — Other Ambulatory Visit: Payer: Self-pay

## 2021-09-07 VITALS — BP 140/86 | HR 74 | Temp 98.0°F | Ht 62.0 in | Wt 218.0 lb

## 2021-09-07 DIAGNOSIS — K5900 Constipation, unspecified: Secondary | ICD-10-CM

## 2021-09-07 DIAGNOSIS — I10 Essential (primary) hypertension: Secondary | ICD-10-CM | POA: Diagnosis not present

## 2021-09-07 MED ORDER — LINZESS 72 MCG PO CAPS: 72.0000 ug | ORAL_CAPSULE | Freq: Every day | ORAL | 5 refills | Status: AC

## 2021-09-07 NOTE — Patient Instructions (Signed)

## 2021-09-07 NOTE — Progress Notes (Signed)
[address], [place]  [address] Phone:  [phone]   Fax:  [phone]   Established Patient Office Visit  Subjective:  Patient ID: Wendy James, female    DOB: 1970-09-21  Age: 51 y.o. MRN: [id number]  CC:  Chief Complaint  Patient presents with   Follow-up    Pt is here for 4 week follow up. Pt stated she is having terrible headache pt went to the hospital 1/28 for it    HPI [facility] presents for follow up. She  has a past medical history of Allergy, Arthritis, Asthma, Bone spur of foot, Hypertension, and Seasonal allergies.   Constipation Patient complains of constipation. Onset was several days ago. Patient has been having rare formed stools . Defecation has been difficult. Co-Morbid conditions: she reports that her recent diet was a possible cuplrit. Symptoms have gradually worsened. Current Health Habits: Eating fiber? no, Exercise? no, Adequate hydration? yes - . Current over the counter laxative: which has been not very effective. She felt like previous Linzess was effective  Hypertension Patient is here for follow-up of elevated blood pressure. She feels like her BP has improved. She is please with the with her current readings. She reported an ED visit for a headache with elevated BP. Recorded BP in EMR was  133/81. She reports being given Benadryl IV which did not cause sedation. CT head was negative.  She endorses increased stress but is coping. 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   Asthma    Bone spur of foot    Hypertension    controlled    Seasonal allergies     Past Surgical History:  Procedure Laterality Date   CESAREAN SECTION     x 4   CHOLECYSTECTOMY     HEEL SPUR EXCISION     08/2017   HYSTEROSCOPY WITH D & C N/A 02/18/2015   Procedure: DILATATION AND CURETTAGE /HYSTEROSCOPY;  Surgeon: Emily Filbert, MD;  Location: [facility];  Service: Gynecology;   Laterality: N/A;   TUBAL LIGATION      Family History  Problem Relation Age of Onset   Hypertension Mother    Stroke Sister    Seizures Sister    Colon cancer Maternal Aunt    Stomach cancer Maternal Uncle    Colon cancer Maternal Grandmother    Colon polyps Neg Hx    Esophageal cancer Neg Hx    Rectal cancer Neg Hx     Social History   Socioeconomic History   Marital status: Single    Spouse name: Not on file   Number of children: Not on file   Years of education: Not on file   Highest education level: Not on file  Occupational History   Not on file  Tobacco Use   Smoking status: Former    Packs/day: 0.75    Types: Cigarettes    Quit date: 11/17/1988    Years since quitting: 32.8   Smokeless tobacco: Never  Vaping Use   Vaping Use: Never used  Substance and Sexual Activity   Alcohol use: No   Drug use: No   Sexual activity: Yes    Birth control/protection: Surgical  Other Topics Concern   Not on file  Social History Narrative   Not on file   Social Determinants of Health   Financial Resource Strain: Not on file  Food  Insecurity: Not on file  Transportation Needs: Not on file  Physical Activity: Not on file  Stress: Not on file  Social Connections: Not on file  Intimate Partner Violence: Not on file    Outpatient Medications Prior to Visit  Medication Sig Dispense Refill   albuterol (VENTOLIN HFA) 108 (90 Base) MCG/ACT inhaler Inhale 1-2 puffs into the lungs every 4 (four) hours as needed for wheezing or shortness of breath. 8 g 3   amLODipine-olmesartan (AZOR) 10-40 MG tablet Take 1 tablet by mouth daily. 90 tablet 3   ibuprofen (ADVIL) 800 MG tablet Take 1 tablet (800 mg total) by mouth every 8 (eight) hours as needed. 30 tablet 0   levocetirizine (XYZAL) 5 MG tablet Take 1 tablet (5 mg total) by mouth at bedtime. 30 tablet 1   zolpidem (AMBIEN) 10 MG tablet Take 1 tablet (10 mg total) by mouth at bedtime. 90 tablet 0   diclofenac (VOLTAREN) 75 MG EC  tablet Take 75 mg by mouth 2 (two) times daily. (Patient not taking: Reported on 08/10/2021)     ketorolac (TORADOL) 30 MG/ML injection Inject 30 mg into the muscle once. (Patient not taking: Reported on 05/03/2021)     No facility-administered medications prior to visit.    Allergies  Allergen Reactions   Mobic [Meloxicam] Hives    Pt reports she gets real bad hives from Mobic   Shrimp [Shellfish Allergy] Other (See Comments)    Wheezing.  Patient states she is ok with betadine    ROS Review of Systems    Objective:    Physical Exam Constitutional:      Appearance: She is normal weight.  HENT:     Head: Normocephalic and atraumatic.     Nose: Nose normal.     Mouth/Throat:     Mouth: Mucous membranes are moist.  Cardiovascular:     Rate and Rhythm: Normal rate and regular rhythm.     Pulses: Normal pulses.     Heart sounds: Normal heart sounds.  Pulmonary:     Effort: Pulmonary effort is normal.     Breath sounds: Normal breath sounds.  Abdominal:     General: Bowel sounds are normal.     Palpations: Abdomen is soft.  Musculoskeletal:        General: Normal range of motion.  Skin:    General: Skin is warm.     Capillary Refill: Capillary refill takes less than 2 seconds.  Neurological:     General: No focal deficit present.     Mental Status: She is oriented to person, place, and time.  Psychiatric:        Mood and Affect: Mood normal.        Behavior: Behavior normal.        Thought Content: Thought content normal.        Judgment: Judgment normal.    BP 140/86    Pulse 74    Temp 98 F (36.7 C)    Ht 5' 2" (1.575 m)    Wt 218 lb (98.9 kg)    SpO2 99%    BMI 39.87 kg/m  Wt Readings from Last 3 Encounters:  09/07/21 218 lb (98.9 kg)  08/10/21 215 lb 12.8 oz (97.9 kg)  05/03/21 215 lb (97.5 kg)     Health Maintenance Due  Topic Date Due   MAMMOGRAM  11/04/2018   COVID-19 Vaccine (3 - Booster for Pfizer series) 12/24/2019   PAP SMEAR-Modifier   09/30/2020  There are no preventive care reminders to display for this patient.  Lab Results  Component Value Date   TSH 0.396 (L) 09/26/2020   Lab Results  Component Value Date   WBC 4.7 08/26/2021   HGB 12.8 08/26/2021   HCT 39.1 08/26/2021   MCV 92.4 08/26/2021   PLT 195 08/26/2021   Lab Results  Component Value Date   NA 139 08/26/2021   K 3.4 (L) 08/26/2021   CO2 27 08/26/2021   GLUCOSE 105 (H) 08/26/2021   BUN 17 08/26/2021   CREATININE 0.71 08/26/2021   BILITOT 0.7 09/26/2020   ALKPHOS 91 09/26/2020   AST 26 09/26/2020   ALT 37 (H) 04/26/2020   PROT 6.8 09/26/2020   ALBUMIN 3.9 09/26/2020   CALCIUM 8.7 (L) 08/26/2021   ANIONGAP 6 08/26/2021   EGFR 105 09/26/2020   GFR 124.88 04/26/2020   Lab Results  Component Value Date   CHOL 150 04/26/2020   Lab Results  Component Value Date   HDL 53.60 04/26/2020   Lab Results  Component Value Date   LDLCALC 84 04/26/2020   Lab Results  Component Value Date   TRIG 63.0 04/26/2020   Lab Results  Component Value Date   CHOLHDL 3 04/26/2020   Lab Results  Component Value Date   HGBA1C 5.6 03/17/2019      Assessment & Plan:   Problem List Items Addressed This Visit       Cardiovascular and Mediastinum   Essential hypertension Stable  Encouraged on going compliance with current medication regimen Encouraged home monitoring and recording BP <130/80 Eating a heart-healthy diet with less salt Encouraged regular physical activity  Recommend Weight loss     Other Visit Diagnoses     Constipation, unspecified constipation type    -  Primary Persistent  Refilled Linzess 72 mcg   Relevant Orders   DG Abd 2 Views       Meds ordered this encounter  Medications   LINZESS 72 MCG capsule    Sig: Take 1 capsule (72 mcg total) by mouth daily before breakfast.    Dispense:  30 capsule    Refill:  5    Order Specific Question:   Supervising Provider    AnswerTresa Garter [[id number]]     Follow-up: No follow-ups on file.    Vevelyn Francois, NP

## 2021-09-18 ENCOUNTER — Other Ambulatory Visit: Payer: Self-pay | Admitting: Nurse Practitioner

## 2021-09-18 ENCOUNTER — Telehealth: Payer: Self-pay

## 2021-09-18 NOTE — Telephone Encounter (Signed)
Pt when to ER over the weekend (out of town)and left her prescription in hotel.  She asking for refills on Inhaler steroid breakdown tussin cough syrup  [facility] on [address]

## 2021-09-21 ENCOUNTER — Ambulatory Visit (INDEPENDENT_AMBULATORY_CARE_PROVIDER_SITE_OTHER): Payer: BC Managed Care – PPO | Admitting: Nurse Practitioner

## 2021-09-21 ENCOUNTER — Encounter: Payer: Self-pay | Admitting: Nurse Practitioner

## 2021-09-21 ENCOUNTER — Other Ambulatory Visit: Payer: Self-pay

## 2021-09-21 VITALS — BP 148/83 | HR 69 | Temp 98.7°F | Ht 62.0 in | Wt 216.0 lb

## 2021-09-21 DIAGNOSIS — J4551 Severe persistent asthma with (acute) exacerbation: Secondary | ICD-10-CM | POA: Diagnosis not present

## 2021-09-21 MED ORDER — HYDROCODONE BIT-HOMATROP MBR 5-1.5 MG/5ML PO SOLN: 5.0000 mL | Freq: Four times a day (QID) | ORAL | 0 refills | Status: DC | PRN

## 2021-09-21 MED ORDER — BUDESONIDE-FORMOTEROL FUMARATE 80-4.5 MCG/ACT IN AERO: 2.0000 | INHALATION_SPRAY | Freq: Two times a day (BID) | RESPIRATORY_TRACT | 12 refills | Status: DC

## 2021-09-21 MED ORDER — DOXYCYCLINE HYCLATE 100 MG PO TABS: 100.0000 mg | ORAL_TABLET | Freq: Two times a day (BID) | ORAL | 0 refills | Status: AC

## 2021-09-21 MED ORDER — ALBUTEROL SULFATE (2.5 MG/3ML) 0.083% IN NEBU: 2.5000 mg | INHALATION_SOLUTION | Freq: Four times a day (QID) | RESPIRATORY_TRACT | 1 refills | Status: DC | PRN

## 2021-09-21 NOTE — Progress Notes (Signed)
[address], [place]  [address] Phone:  [phone]   Fax:  [phone]   Acute Office Visit  Subjective:    Patient ID: Wendy James, female    DOB: [birth year], 51 y.o.   MRN: [id number]  Chief Complaint  Patient presents with   Follow-up    Pt stated she is not feeling well she took a covid and flu test both came back negative .pt has been coughing, wheezing it has been a week     HPI Patient is in today for acute respiratory symptoms. She  has a past medical history of Allergy, Arthritis, Asthma, Bone spur of foot, Hypertension, and Seasonal allergies.   Wendy James presents for evaluation of dyspnea, productive cough, and wheezing. The patient has been previously diagnosed with asthma. Symptoms currently include dyspnea, non-productive cough, and wheezing and occur continuously. Observed precipitants include: no identifiable factor. Current limitations in activity from asthma:  yes . Number of days of school or work missed in the last month: 3. She was seen in the ED in [place] last week while out of town. She was treated with steroids, nebulizer treatment and released. She has a few more days of the prednisone. She reports the chest xray indicated inflammation. Does she do nebulizer treatments? No Does she use an inhaler? Yes Does she use a spacer with MDIs? No Does she monitor peak flow rates? no . She is using her inhaler more. She reports Symbicort was not covered by insurance in the past and was greater that $200.00.  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   Asthma    Bone spur of foot    Hypertension    controlled    Seasonal allergies     Past Surgical History:  Procedure Laterality Date   CESAREAN SECTION     x 4   CHOLECYSTECTOMY     HEEL SPUR EXCISION     08/2017   HYSTEROSCOPY WITH D & C N/A 02/18/2015   Procedure: DILATATION AND CURETTAGE /HYSTEROSCOPY;  Surgeon: Wendy Filbert, MD;  Location: [facility];  Service: Gynecology;  Laterality: N/A;   TUBAL LIGATION      Family History  Problem Relation Age of Onset   Hypertension Mother    Stroke Sister    Seizures Sister    Colon cancer Maternal Aunt    Stomach cancer Maternal Uncle    Colon cancer Maternal Grandmother    Colon polyps Neg Hx    Esophageal cancer Neg Hx    Rectal cancer Neg Hx     Social History   Socioeconomic History   Marital status: Single    Spouse name: Not on file   Number of children: Not on file   Years of education: Not on file   Highest education level: Not on file  Occupational History   Not on file  Tobacco Use   Smoking status: Former    Packs/day: 0.75    Types: Cigarettes    Quit date: 11/17/1988    Years since quitting: 32.8   Smokeless tobacco: Never  Vaping Use   Vaping Use: Never used  Substance and Sexual Activity   Alcohol use: No   Drug use: No   Sexual activity: Yes    Birth control/protection: Surgical  Other Topics Concern   Not on file  Social History Narrative   Not on  file   Social Determinants of Health   Financial Resource Strain: Not on file  Food Insecurity: Not on file  Transportation Needs: Not on file  Physical Activity: Not on file  Stress: Not on file  Social Connections: Not on file  Intimate Partner Violence: Not on file    Outpatient Medications Prior to Visit  Medication Sig Dispense Refill   albuterol (VENTOLIN HFA) 108 (90 Base) MCG/ACT inhaler Inhale 1-2 puffs into the lungs every 4 (four) hours as needed for wheezing or shortness of breath. 8 g 3   amLODipine-olmesartan (AZOR) 10-40 MG tablet Take 1 tablet by mouth daily. 90 tablet 3   diclofenac (VOLTAREN) 75 MG EC tablet Take 75 mg by mouth 2 (two) times daily.     ibuprofen (ADVIL) 800 MG tablet Take 1 tablet (800 mg total) by mouth every 8 (eight) hours as needed. 30 tablet 0   ketorolac (TORADOL) 30 MG/ML injection Inject 30 mg into the muscle once.      levocetirizine (XYZAL) 5 MG tablet Take 1 tablet (5 mg total) by mouth at bedtime. 30 tablet 1   LINZESS 72 MCG capsule Take 1 capsule (72 mcg total) by mouth daily before breakfast. 30 capsule 5   predniSONE (DELTASONE) 20 MG tablet Take 60 mg by mouth daily.     predniSONE (DELTASONE) 20 MG tablet Take by mouth.     zolpidem (AMBIEN) 10 MG tablet Take 1 tablet (10 mg total) by mouth at bedtime. 90 tablet 0   No facility-administered medications prior to visit.    Allergies  Allergen Reactions   Mobic [Meloxicam] Hives    Pt reports she gets real bad hives from Mobic   Shrimp [Shellfish Allergy] Other (See Comments)    Wheezing.  Patient states she is ok with betadine    Review of Systems     Objective:    Physical Exam Constitutional:      General: She is in acute distress.     Appearance: She is obese.  HENT:     Head: Atraumatic.     Nose: Nose normal.     Mouth/Throat:     Mouth: Mucous membranes are moist.  Cardiovascular:     Rate and Rhythm: Normal rate and regular rhythm.     Pulses: Normal pulses.     Heart sounds: Normal heart sounds.  Pulmonary:     Breath sounds: Wheezing present.     Comments: Coarse breath sounds throughout  Musculoskeletal:        General: Normal range of motion.     Cervical back: Normal range of motion.  Skin:    General: Skin is warm and dry.     Capillary Refill: Capillary refill takes less than 2 seconds.  Neurological:     General: No focal deficit present.     Mental Status: She is alert.    BP (!) 148/83    Pulse 69    Temp 98.7 F (37.1 C)    Ht $R'5\' 2"'BX$  (1.575 m)    Wt 216 lb 0.2 oz (98 kg)    SpO2 100%    BMI 39.51 kg/m  Wt Readings from Last 3 Encounters:  09/21/21 216 lb 0.2 oz (98 kg)  09/07/21 218 lb (98.9 kg)  08/10/21 215 lb 12.8 oz (97.9 kg)    Health Maintenance Due  Topic Date Due   MAMMOGRAM  11/04/2018   COVID-19 Vaccine (3 - Booster for Pfizer series) 12/24/2019   PAP SMEAR-Modifier  09/30/2020     There are no preventive care reminders to display for this patient.   Lab Results  Component Value Date   TSH 0.396 (L) 09/26/2020   Lab Results  Component Value Date   WBC 4.7 08/26/2021   HGB 12.8 08/26/2021   HCT 39.1 08/26/2021   MCV 92.4 08/26/2021   PLT 195 08/26/2021   Lab Results  Component Value Date   NA 139 08/26/2021   K 3.4 (L) 08/26/2021   CO2 27 08/26/2021   GLUCOSE 105 (H) 08/26/2021   BUN 17 08/26/2021   CREATININE 0.71 08/26/2021   BILITOT 0.7 09/26/2020   ALKPHOS 91 09/26/2020   AST 26 09/26/2020   ALT 37 (H) 04/26/2020   PROT 6.8 09/26/2020   ALBUMIN 3.9 09/26/2020   CALCIUM 8.7 (L) 08/26/2021   ANIONGAP 6 08/26/2021   EGFR 105 09/26/2020   GFR 124.88 04/26/2020   Lab Results  Component Value Date   CHOL 150 04/26/2020   Lab Results  Component Value Date   HDL 53.60 04/26/2020   Lab Results  Component Value Date   LDLCALC 84 04/26/2020   Lab Results  Component Value Date   TRIG 63.0 04/26/2020   Lab Results  Component Value Date   CHOLHDL 3 04/26/2020   Lab Results  Component Value Date   HGBA1C 5.6 03/17/2019       Assessment & Plan:   Problem List Items Addressed This Visit       Respiratory   Asthma exacerbation - Primary Encourage change in treatment Symbicort for dual therapy due to increased use of Albuterol inhalers.  FMLA will need to be updated due to exacerbation   Relevant Medications   predniSONE (DELTASONE) 20 MG tablet   predniSONE (DELTASONE) 20 MG tablet   budesonide-formoterol (SYMBICORT) 80-4.5 MCG/ACT inhaler   albuterol (PROVENTIL) (2.5 MG/3ML) 0.083% nebulizer solution     Meds ordered this encounter  Medications   doxycycline (VIBRA-TABS) 100 MG tablet    Sig: Take 1 tablet (100 mg total) by mouth 2 (two) times daily for 7 days.    Dispense:  14 tablet    Refill:  0    Order Specific Question:   Supervising Provider    Answer:   Tresa Garter [[id number]]   budesonide-formoterol  (SYMBICORT) 80-4.5 MCG/ACT inhaler    Sig: Inhale 2 puffs into the lungs 2 (two) times daily.    Dispense:  1 each    Refill:  12    Order Specific Question:   Supervising Provider    Answer:   Tresa Garter [[id number]]   albuterol (PROVENTIL) (2.5 MG/3ML) 0.083% nebulizer solution    Sig: Take 3 mLs (2.5 mg total) by nebulization every 6 (six) hours as needed for wheezing or shortness of breath.    Dispense:  150 mL    Refill:  1    Order Specific Question:   Supervising Provider    Answer:   Tresa Garter [[id number]]   HYDROcodone bit-homatropine (HYCODAN) 5-1.5 MG/5ML syrup    Sig: Take 5 mLs by mouth every 6 (six) hours as needed for cough.    Dispense:  120 mL    Refill:  0    Order Specific Question:   Supervising Provider    Answer:   Tresa Garter [[id number]]     Vevelyn Francois, NP

## 2021-09-25 MED ORDER — ALBUTEROL SULFATE HFA 108 (90 BASE) MCG/ACT IN AERS: 1.0000 | INHALATION_SPRAY | RESPIRATORY_TRACT | 3 refills | Status: DC | PRN

## 2021-10-10 ENCOUNTER — Telehealth: Payer: Self-pay

## 2021-10-10 NOTE — Telephone Encounter (Signed)
Patient contacted to schedule mammogram.  ? ?RE: Mobile Mammo event located at: ? ?[email or web address]  ?      ?[address]    ?[place] [address]    ? ?Date: April 7th at 9:50 am ? ? ? ? ?[facility] password reset per patient request.  ?

## 2021-10-31 ENCOUNTER — Telehealth: Payer: Self-pay | Admitting: Nurse Practitioner

## 2021-10-31 NOTE — Telephone Encounter (Signed)
[facility] on [address] requesting refills on Zolpidem 10mg  an Albuteroll HFA Inhaler ?Patient has appt with [place] on 12/08/21 ?

## 2021-11-01 ENCOUNTER — Other Ambulatory Visit: Payer: Self-pay | Admitting: Nurse Practitioner

## 2021-11-01 DIAGNOSIS — G4700 Insomnia, unspecified: Secondary | ICD-10-CM

## 2021-11-01 MED ORDER — ALBUTEROL SULFATE (2.5 MG/3ML) 0.083% IN NEBU: 2.5000 mg | INHALATION_SOLUTION | Freq: Four times a day (QID) | RESPIRATORY_TRACT | 1 refills | Status: DC | PRN

## 2021-11-01 MED ORDER — ZOLPIDEM TARTRATE 10 MG PO TABS: 10.0000 mg | ORAL_TABLET | Freq: Every day | ORAL | 0 refills | Status: DC

## 2021-11-03 ENCOUNTER — Inpatient Hospital Stay: Admission: RE | Admit: 2021-11-03 | Payer: BC Managed Care – PPO | Source: Ambulatory Visit

## 2021-11-28 ENCOUNTER — Other Ambulatory Visit: Payer: Self-pay

## 2021-11-28 MED ORDER — LEVOCETIRIZINE DIHYDROCHLORIDE 5 MG PO TABS: 5.0000 mg | ORAL_TABLET | Freq: Every day | ORAL | 1 refills | Status: DC

## 2021-12-06 ENCOUNTER — Ambulatory Visit: Payer: BC Managed Care – PPO | Admitting: Nurse Practitioner

## 2021-12-08 ENCOUNTER — Ambulatory Visit: Payer: BC Managed Care – PPO | Admitting: Nurse Practitioner

## 2021-12-12 ENCOUNTER — Ambulatory Visit (INDEPENDENT_AMBULATORY_CARE_PROVIDER_SITE_OTHER): Payer: BC Managed Care – PPO | Admitting: Nurse Practitioner

## 2021-12-12 VITALS — BP 136/87 | HR 69 | Temp 98.2°F | Ht 62.0 in | Wt 215.8 lb

## 2021-12-12 DIAGNOSIS — I10 Essential (primary) hypertension: Secondary | ICD-10-CM | POA: Diagnosis not present

## 2021-12-12 DIAGNOSIS — G4701 Insomnia due to medical condition: Secondary | ICD-10-CM

## 2021-12-12 DIAGNOSIS — N951 Menopausal and female climacteric states: Secondary | ICD-10-CM

## 2021-12-12 NOTE — Patient Instructions (Signed)
You were seen today in the [facility] for reevaluation of HTN. Labs were collected, results will be available via MyChart or, if abnormal, you will be contacted by clinic staff. You were prescribed medications, please take as directed. Please follow up in 1 mth for pap smear.  ?

## 2021-12-12 NOTE — Progress Notes (Signed)
[address] [place], [place]  [address] Phone:  [phone]   Fax:  [phone] Subjective:   Patient ID: Wendy James, female    DOB: 1970/08/19, 51 y.o.   MRN: [id number]  Chief Complaint  Patient presents with   Follow-up    Pt is here for 3 months BP follow up visit.   HPI Wendy James 51 y.o. female  has a past medical history of Allergy, Arthritis, Asthma, Bone spur of foot, Hypertension, and Seasonal allergies. To the [facility] for reevaluation of B/P.   Hypertension: Patient here for follow-up of elevated blood pressure. She is exercising and is adherent to low salt diet.  Patient does not regularly check B/P at home.  Cardiac symptoms none. Patient denies chest pain, claudication, fatigue, and orthopnea.  Cardiovascular risk factors: none. Use of agents associated with hypertension: none. History of target organ damage: none. Patient states that she checks her B/P at home when she is feeling ill or has a headache and it is typically higher than today's value, 170/80.   Patient also concerned about insomnia, states that she has been taking prescribed Ambien with no improvement in symptoms, has even increased dosage to two pills. Patient states that symptoms related to anxiety and menopause. States that she also has increased hot flashes at night. Patient also endorses decreased libido. Denies any other concerns today.   Denies any fatigue, chest pain, shortness of breath, HA or dizziness. Denies any blurred vision, numbness or tingling.   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   Asthma    Bone spur of foot    Hypertension    controlled    Seasonal allergies     Past Surgical History:  Procedure Laterality Date   CESAREAN SECTION     x 4   CHOLECYSTECTOMY     HEEL SPUR EXCISION     08/2017   HYSTEROSCOPY WITH D & C N/A 02/18/2015   Procedure: DILATATION AND CURETTAGE /HYSTEROSCOPY;   Surgeon: Emily Filbert, MD;  Location: [facility];  Service: Gynecology;  Laterality: N/A;   TUBAL LIGATION      Family History  Problem Relation Age of Onset   Hypertension Mother    Stroke Sister    Seizures Sister    Colon cancer Maternal Aunt    Stomach cancer Maternal Uncle    Colon cancer Maternal Grandmother    Colon polyps Neg Hx    Esophageal cancer Neg Hx    Rectal cancer Neg Hx     Social History   Socioeconomic History   Marital status: Single    Spouse name: Not on file   Number of children: Not on file   Years of education: Not on file   Highest education level: Not on file  Occupational History   Not on file  Tobacco Use   Smoking status: Former    Packs/day: 0.75    Types: Cigarettes    Quit date: 11/17/1988    Years since quitting: 33.0   Smokeless tobacco: Never  Vaping Use   Vaping Use: Never used  Substance and Sexual Activity   Alcohol use: No   Drug use: No   Sexual activity: Yes    Birth control/protection: Surgical  Other Topics Concern   Not on file  Social History Narrative   Not on file   Social Determinants of Health  Financial Resource Strain: Not on file  Food Insecurity: Not on file  Transportation Needs: Not on file  Physical Activity: Not on file  Stress: Not on file  Social Connections: Not on file  Intimate Partner Violence: Not on file    Outpatient Medications Prior to Visit  Medication Sig Dispense Refill   albuterol (PROVENTIL) (2.5 MG/3ML) 0.083% nebulizer solution Take 3 mLs (2.5 mg total) by nebulization every 6 (six) hours as needed for wheezing or shortness of breath. 150 mL 1   albuterol (VENTOLIN HFA) 108 (90 Base) MCG/ACT inhaler Inhale 1-2 puffs into the lungs every 4 (four) hours as needed for wheezing or shortness of breath. 8 g 3   amLODipine-olmesartan (AZOR) 10-40 MG tablet Take 1 tablet by mouth daily. 90 tablet 3   budesonide-formoterol (SYMBICORT) 80-4.5 MCG/ACT inhaler Inhale 2 puffs into the lungs 2  (two) times daily. 1 each 12   HYDROcodone bit-homatropine (HYCODAN) 5-1.5 MG/5ML syrup Take 5 mLs by mouth every 6 (six) hours as needed for cough. 120 mL 0   ketorolac (TORADOL) 30 MG/ML injection Inject 30 mg into the muscle once.     levocetirizine (XYZAL) 5 MG tablet Take 1 tablet (5 mg total) by mouth at bedtime. 30 tablet 1   LINZESS 72 MCG capsule Take 1 capsule (72 mcg total) by mouth daily before breakfast. 30 capsule 5   zolpidem (AMBIEN) 10 MG tablet Take 1 tablet (10 mg total) by mouth at bedtime. 90 tablet 0   diclofenac (VOLTAREN) 75 MG EC tablet Take 75 mg by mouth 2 (two) times daily. (Patient not taking: Reported on 12/12/2021)     ibuprofen (ADVIL) 800 MG tablet Take 1 tablet (800 mg total) by mouth every 8 (eight) hours as needed. (Patient not taking: Reported on 12/12/2021) 30 tablet 0   predniSONE (DELTASONE) 20 MG tablet Take 60 mg by mouth daily. (Patient not taking: Reported on 12/12/2021)     No facility-administered medications prior to visit.    Allergies  Allergen Reactions   Mobic [Meloxicam] Hives    Pt reports she gets real bad hives from Mobic   Shrimp [Shellfish Allergy] Other (See Comments)    Wheezing.  Patient states she is ok with betadine    Review of Systems  Constitutional:  Negative for chills, fever and malaise/fatigue.       See HPI  Eyes: Negative.   Respiratory:  Negative for cough and shortness of breath.   Cardiovascular:  Negative for chest pain, palpitations and leg swelling.  Gastrointestinal:  Negative for abdominal pain, blood in stool, constipation, diarrhea, nausea and vomiting.  Skin: Negative.   Neurological: Negative.   Psychiatric/Behavioral:  Negative for depression. The patient is nervous/anxious.   All other systems reviewed and are negative.     Objective:    Physical Exam Vitals reviewed.  Constitutional:      General: She is not in acute distress.    Appearance: Normal appearance. She is obese.  HENT:     Head:  Normocephalic.     Right Ear: Tympanic membrane, ear canal and external ear normal. There is no impacted cerumen.     Left Ear: Tympanic membrane, ear canal and external ear normal. There is no impacted cerumen.     Nose: Nose normal. No congestion or rhinorrhea.     Mouth/Throat:     Mouth: Mucous membranes are moist.     Pharynx: Oropharynx is clear. No oropharyngeal exudate or posterior oropharyngeal erythema.  Eyes:  General: No scleral icterus.       Right eye: No discharge.        Left eye: No discharge.     Extraocular Movements: Extraocular movements intact.     Conjunctiva/sclera: Conjunctivae normal.     Pupils: Pupils are equal, round, and reactive to light.  Neck:     Vascular: No carotid bruit.  Cardiovascular:     Rate and Rhythm: Normal rate and regular rhythm.     Pulses: Normal pulses.     Heart sounds: Normal heart sounds.     Comments: No obvious peripheral edema Pulmonary:     Effort: Pulmonary effort is normal.     Breath sounds: Normal breath sounds.  Musculoskeletal:        General: No swelling, tenderness, deformity or signs of injury. Normal range of motion.     Cervical back: Normal range of motion and neck supple. No rigidity or tenderness.     Right lower leg: No edema.     Left lower leg: No edema.  Lymphadenopathy:     Cervical: No cervical adenopathy.  Skin:    General: Skin is warm and dry.     Capillary Refill: Capillary refill takes less than 2 seconds.  Neurological:     General: No focal deficit present.     Mental Status: She is alert and oriented to person, place, and time.  Psychiatric:        Mood and Affect: Mood normal.        Behavior: Behavior normal.        Thought Content: Thought content normal.        Judgment: Judgment normal.    BP 136/87 (BP Location: Left Arm, Patient Position: Sitting, Cuff Size: Large)   Pulse 69   Temp 98.2 F (36.8 C)   Ht $R'5\' 2"'JX$  (1.575 m)   Wt 215 lb 12.8 oz (97.9 kg)   SpO2 100%   BMI 39.47  kg/m  Wt Readings from Last 3 Encounters:  12/12/21 215 lb 12.8 oz (97.9 kg)  09/21/21 216 lb 0.2 oz (98 kg)  09/07/21 218 lb (98.9 kg)    Immunization History  Administered Date(s) Administered   Influenza Split 04/11/2012   Influenza,inj,Quad PF,6+ Mos 04/30/2015, 06/20/2020   PFIZER(Purple Top)SARS-COV-2 Vaccination 09/28/2019, 10/29/2019   Pneumococcal Polysaccharide-23 04/30/2015   Tdap 02/14/2018    Diabetic Foot Exam - Simple   No data filed     Lab Results  Component Value Date   TSH 0.396 (L) 09/26/2020   Lab Results  Component Value Date   WBC 4.7 08/26/2021   HGB 12.8 08/26/2021   HCT 39.1 08/26/2021   MCV 92.4 08/26/2021   PLT 195 08/26/2021   Lab Results  Component Value Date   NA 139 08/26/2021   K 3.4 (L) 08/26/2021   CO2 27 08/26/2021   GLUCOSE 105 (H) 08/26/2021   BUN 17 08/26/2021   CREATININE 0.71 08/26/2021   BILITOT 0.7 09/26/2020   ALKPHOS 91 09/26/2020   AST 26 09/26/2020   ALT 37 (H) 04/26/2020   PROT 6.8 09/26/2020   ALBUMIN 3.9 09/26/2020   CALCIUM 8.7 (L) 08/26/2021   ANIONGAP 6 08/26/2021   EGFR 105 09/26/2020   GFR 124.88 04/26/2020   Lab Results  Component Value Date   CHOL 150 04/26/2020   CHOL 186 01/05/2013   Lab Results  Component Value Date   HDL 53.60 04/26/2020   HDL 67 01/05/2013   Lab Results  Component  Value Date   LDLCALC 84 04/26/2020   LDLCALC 99 01/05/2013   Lab Results  Component Value Date   TRIG 63.0 04/26/2020   TRIG 99 01/05/2013   Lab Results  Component Value Date   CHOLHDL 3 04/26/2020   CHOLHDL 2.8 01/05/2013   Lab Results  Component Value Date   HGBA1C 6.1 (H) 12/12/2021   HGBA1C 5.6 03/17/2019   HGBA1C 5.8 (H) 09/30/2017       Assessment & Plan:   Problem List Items Addressed This Visit       Cardiovascular and Mediastinum   Essential hypertension - Primary   Relevant Orders   Hemoglobin A1c (Completed): 6.1, prediabetic  Encouraged continued diet and exercise efforts   Encouraged continued compliance with medication     Other Visit Diagnoses     Insomnia due to medical condition     Refilled ambien Discussed non pharmacological methods for management of symptoms Informed to take OTC medications as needed Discussed possible causes    Menopausal hot flushes       Relevant Orders   Estrogens, Total (Completed) Discussed non pharmacological methods for management of symptoms Informed to take OTC medications as needed    Follow up in 1 mth for pap smear, sooner as needed     I am having Santana Roussel maintain her diclofenac, ketorolac, amLODipine-olmesartan, ibuprofen, Linzess, albuterol, predniSONE, budesonide-formoterol, HYDROcodone bit-homatropine, albuterol, zolpidem, and levocetirizine.  No orders of the defined types were placed in this encounter.    Teena Dunk, NP

## 2021-12-14 ENCOUNTER — Encounter: Payer: Self-pay | Admitting: Nurse Practitioner

## 2021-12-15 LAB — HEMOGLOBIN A1C
Est. average glucose Bld gHb Est-mCnc: 128 mg/dL
Hgb A1c MFr Bld: 6.1 % — ABNORMAL HIGH (ref 4.8–5.6)

## 2021-12-15 LAB — ESTROGENS, TOTAL: Estrogen: 64 pg/mL

## 2022-01-15 ENCOUNTER — Encounter: Payer: Self-pay | Admitting: Nurse Practitioner

## 2022-01-15 ENCOUNTER — Ambulatory Visit (INDEPENDENT_AMBULATORY_CARE_PROVIDER_SITE_OTHER): Payer: BC Managed Care – PPO | Admitting: Nurse Practitioner

## 2022-01-15 ENCOUNTER — Other Ambulatory Visit: Payer: Self-pay

## 2022-01-15 VITALS — BP 158/101 | HR 67 | Temp 97.6°F | Ht 62.0 in | Wt 215.2 lb

## 2022-01-15 DIAGNOSIS — J4521 Mild intermittent asthma with (acute) exacerbation: Secondary | ICD-10-CM | POA: Diagnosis not present

## 2022-01-15 MED ORDER — ALBUTEROL SULFATE HFA 108 (90 BASE) MCG/ACT IN AERS: 1.0000 | INHALATION_SPRAY | RESPIRATORY_TRACT | 3 refills | Status: DC | PRN

## 2022-01-15 MED ORDER — HYDROCODONE BIT-HOMATROP MBR 5-1.5 MG/5ML PO SOLN: 5.0000 mL | Freq: Four times a day (QID) | ORAL | 0 refills | Status: DC | PRN

## 2022-01-15 MED ORDER — LEVOCETIRIZINE DIHYDROCHLORIDE 5 MG PO TABS: 5.0000 mg | ORAL_TABLET | Freq: Every day | ORAL | 1 refills | Status: DC

## 2022-01-15 NOTE — Assessment & Plan Note (Signed)
-   albuterol (VENTOLIN HFA) 108 (90 Base) MCG/ACT inhaler; Inhale 1-2 puffs into the lungs every 4 (four) hours as needed for wheezing or shortness of breath.  Dispense: 8 g; Refill: 3 - HYDROcodone bit-homatropine (HYCODAN) 5-1.5 MG/5ML syrup; Take 5 mLs by mouth every 6 (six) hours as needed for up to 7 days for cough.  Dispense: 120 mL; Refill: 0 - levocetirizine (XYZAL) 5 MG tablet; Take 1 tablet (5 mg total) by mouth at bedtime.  Dispense: 30 tablet; Refill: 1   Follow up:  Follow up in 3 months or sooner if needed

## 2022-01-15 NOTE — Patient Instructions (Signed)
1. Mild intermittent asthma with acute exacerbation  - albuterol (VENTOLIN HFA) 108 (90 Base) MCG/ACT inhaler; Inhale 1-2 puffs into the lungs every 4 (four) hours as needed for wheezing or shortness of breath.  Dispense: 8 g; Refill: 3 - HYDROcodone bit-homatropine (HYCODAN) 5-1.5 MG/5ML syrup; Take 5 mLs by mouth every 6 (six) hours as needed for up to 7 days for cough.  Dispense: 120 mL; Refill: 0 - levocetirizine (XYZAL) 5 MG tablet; Take 1 tablet (5 mg total) by mouth at bedtime.  Dispense: 30 tablet; Refill: 1   Follow up:  Follow up in 3 months or sooner if needed

## 2022-01-15 NOTE — Progress Notes (Addendum)
@Patient  ID: , female    DOB: January 07, 1971, 51 y.o.   MRN: [id number]  Chief Complaint  Patient presents with   Follow-up    Patient is here today to discuss her asthma flare up. Patient stating that she had two flare up over the weekend and is needing refills on her asthma medications.    Referring provider: [id number], NP   HPI   Patient presents today for a asthma flare.  Patient states that she has been having flareup for the last few days.  She does need a refill on her asthma medications.  She has been coughing at night.  She is requesting refill on her medication.  Patient's blood pressure is slightly elevated today but she states that she did not take her medicine today.  We discussed that she does need to take this when she gets home and she does need to take it as prescribed. Denies f/c/s, n/v/d, hemoptysis, PND, leg swelling Denies chest pain or edema    Did not take BP meds today  Allergies  Allergen Reactions   Mobic [Meloxicam] Hives    Pt reports she gets real bad hives from Mobic   Shrimp [Shellfish Allergy] Other (See Comments)    Wheezing.  Patient states she is ok with betadine    Immunization History  Administered Date(s) Administered   Influenza Split 04/11/2012   Influenza,inj,Quad PF,6+ Mos 04/30/2015, 06/20/2020   PFIZER(Purple Top)SARS-COV-2 Vaccination 09/28/2019, 10/29/2019   Pneumococcal Polysaccharide-23 04/30/2015   Tdap 02/14/2018    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   Asthma    Bone spur of foot    Hypertension    controlled    Seasonal allergies     Tobacco History: Social History   Tobacco Use  Smoking Status Former   Packs/day: 0.75   Types: Cigarettes   Quit date: 11/17/1988   Years since quitting: 33.1  Smokeless Tobacco Never   Counseling given: Not Answered   Outpatient Encounter Medications as of 01/15/2022  Medication Sig   albuterol (PROVENTIL) (2.5  MG/3ML) 0.083% nebulizer solution Take 3 mLs (2.5 mg total) by nebulization every 6 (six) hours as needed for wheezing or shortness of breath.   amLODipine-olmesartan (AZOR) 10-40 MG tablet Take 1 tablet by mouth daily.   budesonide-formoterol (SYMBICORT) 80-4.5 MCG/ACT inhaler Inhale 2 puffs into the lungs 2 (two) times daily.   LINZESS 72 MCG capsule Take 1 capsule (72 mcg total) by mouth daily before breakfast.   zolpidem (AMBIEN) 10 MG tablet Take 1 tablet (10 mg total) by mouth at bedtime.   [DISCONTINUED] albuterol (VENTOLIN HFA) 108 (90 Base) MCG/ACT inhaler Inhale 1-2 puffs into the lungs every 4 (four) hours as needed for wheezing or shortness of breath.   [DISCONTINUED] HYDROcodone bit-homatropine (HYCODAN) 5-1.5 MG/5ML syrup Take 5 mLs by mouth every 6 (six) hours as needed for cough.   [DISCONTINUED] levocetirizine (XYZAL) 5 MG tablet Take 1 tablet (5 mg total) by mouth at bedtime.   albuterol (VENTOLIN HFA) 108 (90 Base) MCG/ACT inhaler Inhale 1-2 puffs into the lungs every 4 (four) hours as needed for wheezing or shortness of breath.   diclofenac (VOLTAREN) 75 MG EC tablet Take 75 mg by mouth 2 (two) times daily. (Patient not taking: Reported on 12/12/2021)   HYDROcodone bit-homatropine (HYCODAN) 5-1.5 MG/5ML syrup Take 5 mLs by mouth every 6 (six) hours as needed for up to 7 days  for cough.   ibuprofen (ADVIL) 800 MG tablet Take 1 tablet (800 mg total) by mouth every 8 (eight) hours as needed. (Patient not taking: Reported on 12/12/2021)   ketorolac (TORADOL) 30 MG/ML injection Inject 30 mg into the muscle once. (Patient not taking: Reported on 01/15/2022)   levocetirizine (XYZAL) 5 MG tablet Take 1 tablet (5 mg total) by mouth at bedtime.   predniSONE (DELTASONE) 20 MG tablet Take 60 mg by mouth daily. (Patient not taking: Reported on 12/12/2021)   [DISCONTINUED] cetirizine (ZYRTEC) 10 MG tablet Take 1 tablet (10 mg total) by mouth daily.   No facility-administered encounter medications  on file as of 01/15/2022.     Review of Systems  Review of Systems  Constitutional: Negative.   HENT: Negative.    Respiratory:  Positive for cough and shortness of breath.   Cardiovascular: Negative.   Gastrointestinal: Negative.   Allergic/Immunologic: Negative.   Neurological: Negative.   Psychiatric/Behavioral: Negative.         Physical Exam  BP (!) 158/101   Pulse 67   Temp 97.6 F (36.4 C)   Ht 5\' 2"  (1.575 m)   Wt 215 lb 3.2 oz (97.6 kg)   SpO2 98%   BMI 39.36 kg/m   Wt Readings from Last 5 Encounters:  01/15/22 215 lb 3.2 oz (97.6 kg)  12/12/21 215 lb 12.8 oz (97.9 kg)  09/21/21 216 lb 0.2 oz (98 kg)  09/07/21 218 lb (98.9 kg)  08/10/21 215 lb 12.8 oz (97.9 kg)     Physical Exam Vitals and nursing note reviewed.  Constitutional:      General: She is not in acute distress.    Appearance: She is well-developed.  Cardiovascular:     Rate and Rhythm: Normal rate and regular rhythm.  Pulmonary:     Effort: Pulmonary effort is normal.     Breath sounds: Normal breath sounds.  Neurological:     Mental Status: She is alert and oriented to person, place, and time.      Lab Results:  CBC    Component Value Date/Time   WBC 4.7 08/26/2021 1846   RBC 4.23 08/26/2021 1846   HGB 12.8 08/26/2021 1846   HGB 12.5 12/10/2019 1051   HCT 39.1 08/26/2021 1846   HCT 37.9 12/10/2019 1051   PLT 195 08/26/2021 1846   PLT 224 12/10/2019 1051   MCV 92.4 08/26/2021 1846   MCV 89 12/10/2019 1051   MCH 30.3 08/26/2021 1846   MCHC 32.7 08/26/2021 1846   RDW 12.5 08/26/2021 1846   RDW 13.4 12/10/2019 1051   LYMPHSABS 2.1 08/26/2021 1846   LYMPHSABS 1.6 12/10/2019 1051   MONOABS 0.5 08/26/2021 1846   EOSABS 0.2 08/26/2021 1846   EOSABS 0.1 12/10/2019 1051   BASOSABS 0.0 08/26/2021 1846   BASOSABS 0.0 12/10/2019 1051    BMET    Component Value Date/Time   NA 139 08/26/2021 1846   NA 141 09/26/2020 1428   K 3.4 (L) 08/26/2021 1846   CL 106 08/26/2021 1846    CO2 27 08/26/2021 1846   GLUCOSE 105 (H) 08/26/2021 1846   BUN 17 08/26/2021 1846   BUN 9 09/26/2020 1428   CREATININE 0.71 08/26/2021 1846   CREATININE 0.65 05/02/2017 0958   CALCIUM 8.7 (L) 08/26/2021 1846   GFRNONAA >60 08/26/2021 1846   GFRNONAA 105 05/02/2017 0958   GFRAA 125 12/10/2019 1051   GFRAA 122 05/02/2017 0958    BNP No results found for: "BNP"  ProBNP  No results found for: "PROBNP"  Imaging: No results found.   Assessment & Plan:   Mild intermittent asthma - albuterol (VENTOLIN HFA) 108 (90 Base) MCG/ACT inhaler; Inhale 1-2 puffs into the lungs every 4 (four) hours as needed for wheezing or shortness of breath.  Dispense: 8 g; Refill: 3 - HYDROcodone bit-homatropine (HYCODAN) 5-1.5 MG/5ML syrup; Take 5 mLs by mouth every 6 (six) hours as needed for up to 7 days for cough.  Dispense: 120 mL; Refill: 0 - levocetirizine (XYZAL) 5 MG tablet; Take 1 tablet (5 mg total) by mouth at bedtime.  Dispense: 30 tablet; Refill: 1   Follow up:  Follow up in 3 months or sooner if needed     Ivonne Andrew, NP 01/15/2022

## 2022-01-17 ENCOUNTER — Telehealth: Payer: Self-pay | Admitting: Nurse Practitioner

## 2022-01-17 ENCOUNTER — Other Ambulatory Visit: Payer: Self-pay

## 2022-01-17 DIAGNOSIS — J4521 Mild intermittent asthma with (acute) exacerbation: Secondary | ICD-10-CM

## 2022-01-17 NOTE — Telephone Encounter (Signed)
This was sent on 01/15/22 to the patient pharmacy

## 2022-01-17 NOTE — Telephone Encounter (Signed)
HYDROcodone bit-homatropine (HYCODAN) 5-1.5 MG/5ML syrup refill request

## 2022-01-18 MED ORDER — HYDROCODONE BIT-HOMATROP MBR 5-1.5 MG/5ML PO SOLN: 5.0000 mL | Freq: Four times a day (QID) | ORAL | 0 refills | Status: AC | PRN

## 2022-02-04 ENCOUNTER — Encounter (HOSPITAL_COMMUNITY): Payer: Self-pay

## 2022-02-04 ENCOUNTER — Ambulatory Visit (HOSPITAL_COMMUNITY)
Admission: EM | Admit: 2022-02-04 | Discharge: 2022-02-04 | Disposition: A | Discharge status: Home / Self Care | Payer: BC Managed Care – PPO | Attending: Internal Medicine | Admitting: Internal Medicine

## 2022-02-04 DIAGNOSIS — R519 Headache, unspecified: Secondary | ICD-10-CM | POA: Diagnosis not present

## 2022-02-04 LAB — CBG MONITORING, ED: Glucose-Capillary: 115 mg/dL — ABNORMAL HIGH (ref 70–99)

## 2022-02-04 MED ORDER — DEXAMETHASONE SODIUM PHOSPHATE 10 MG/ML IJ SOLN
10.0000 mg | Freq: Once | INTRAMUSCULAR | Status: AC
  Administered 2022-02-04: 10 mg via INTRAMUSCULAR

## 2022-02-04 MED ORDER — KETOROLAC TROMETHAMINE 30 MG/ML IJ SOLN
30.0000 mg | Freq: Once | INTRAMUSCULAR | Status: AC
  Administered 2022-02-04: 30 mg via INTRAMUSCULAR

## 2022-02-04 MED ORDER — SUMATRIPTAN SUCCINATE 6 MG/0.5ML ~~LOC~~ SOLN
6.0000 mg | Freq: Once | SUBCUTANEOUS | Status: AC
  Administered 2022-02-04: 6 mg via SUBCUTANEOUS

## 2022-02-04 MED ORDER — SUMATRIPTAN SUCCINATE 6 MG/0.5ML ~~LOC~~ SOLN
SUBCUTANEOUS | Status: AC
  Filled 2022-02-04: qty 0.5

## 2022-02-04 MED ORDER — DEXAMETHASONE SODIUM PHOSPHATE 10 MG/ML IJ SOLN
INTRAMUSCULAR | Status: AC
  Filled 2022-02-04: qty 1

## 2022-02-04 MED ORDER — KETOROLAC TROMETHAMINE 30 MG/ML IJ SOLN
INTRAMUSCULAR | Status: AC
  Filled 2022-02-04: qty 1

## 2022-02-04 MED ORDER — IBUPROFEN 800 MG PO TABS: 800.0000 mg | ORAL_TABLET | Freq: Three times a day (TID) | ORAL | 0 refills | Status: DC

## 2022-02-04 MED ORDER — MECLIZINE HCL 12.5 MG PO TABS: 12.5000 mg | ORAL_TABLET | Freq: Three times a day (TID) | ORAL | 0 refills | Status: DC | PRN

## 2022-02-04 NOTE — ED Triage Notes (Signed)
Patient presents to Urgent Care with complaints of dizziness, strange feeling and ha since 3 pm. Patient reports she has eaten okay, and drank 3 bottles of water today.

## 2022-02-04 NOTE — Discharge Instructions (Signed)
There are no abnormalities to your neurological exam therefore we can move forward with treatment of your headache  You have been given an injection of Toradol, Decadron and Imitrex here in the office today, these medications work together to reduce pressure and inflammation that are contributing to your headache  Once home you may take ibuprofen 800 mg every 8 hours and Tylenol 1000 mg every 6 hours to help manage her discomfort  While headache is present please rest and participate in low stimulation activities avoiding loud noises and bright lights  Use meclizine every 8 hours as needed to help with your dizziness  At any point if you feel like your symptoms are in please go to the nearest emergency department for further evaluation and management  Schedule a follow-up appointment with your PCP for later this week for reevaluation

## 2022-02-04 NOTE — ED Provider Notes (Signed)
[facility]    CSN: [id number] Arrival date & time: 02/04/22  1756      History   Chief Complaint Chief Complaint  Patient presents with   Dizziness    HPI Wendy James is a 51 y.o. female.   Patient presents with a generalized headache and dizziness beginning this morning upon awakening.  Headache is rated a 10 out of 10, described as aching.  Associated blurred vision and floaters, photophobia and phonophobia.  Dizziness is described as feeling unbalanced.  Has not attempted treatment of symptoms.  History of hypertension, prediabetic Beatties, familial history of CVA.  Taking all medications as directed.  Denies memory or speech changes, generalized weakness, numbness or tingling, shortness of breath, chest pain .   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   Asthma    Bone spur of foot    Hypertension    controlled    Seasonal allergies     Patient Active Problem List   Diagnosis Date Noted   Abnormal serum thyroid stimulating hormone (TSH) level 12/14/2019   Vitamin D insufficiency 12/14/2019   Asthma exacerbation 04/08/2019   Acute otitis media 12/04/2018   Acute ear pain, left 12/04/2018   Heel spur, left 06/11/2018   Plantar fasciitis, left 03/11/2018   Influenza A with respiratory manifestations 09/06/2016   Prediabetes 05/08/2015   Essential hypertension 05/08/2015   Mild intermittent asthma 05/06/2015   Leukocytosis, steroid induced  04/29/2015   Steroid-induced hyperglycemia 04/29/2015   Accelerated hypertension 04/28/2015   Anemia of chroic diseae, IDA 11/17/2013   Bronchitis, acute 04/12/2012   Acute asthma exacerbation 03/12/2012    Past Surgical History:  Procedure Laterality Date   CESAREAN SECTION     x 4   CHOLECYSTECTOMY     HEEL SPUR EXCISION     08/2017   HYSTEROSCOPY WITH D & C N/A 02/18/2015   Procedure: DILATATION AND CURETTAGE /HYSTEROSCOPY;  Surgeon: Allie Bossier, MD;  Location:  [facility];  Service: Gynecology;  Laterality: N/A;   TUBAL LIGATION      OB History     Gravida  5   Para  4   Term  4   Preterm  0   AB  1   Living  3      SAB  1   IAB  0   Ectopic  0   Multiple  0   Live Births               Home Medications    Prior to Admission medications   Medication Sig Start Date End Date Taking? Authorizing Provider  albuterol (PROVENTIL) (2.5 MG/3ML) 0.083% nebulizer solution Take 3 mLs (2.5 mg total) by nebulization every 6 (six) hours as needed for wheezing or shortness of breath. 11/01/21   Passmore, Enid Derry I, NP  albuterol (VENTOLIN HFA) 108 (90 Base) MCG/ACT inhaler Inhale 1-2 puffs into the lungs every 4 (four) hours as needed for wheezing or shortness of breath. 01/15/22 01/15/23  Ivonne Andrew, NP  amLODipine-olmesartan (AZOR) 10-40 MG tablet Take 1 tablet by mouth daily. 08/10/21   Barbette Merino, NP  budesonide-formoterol (SYMBICORT) 80-4.5 MCG/ACT inhaler Inhale 2 puffs into the lungs 2 (two) times daily. 09/21/21   Barbette Merino, NP  diclofenac (VOLTAREN) 75 MG EC tablet Take 75 mg by mouth 2 (two) times daily. Patient not taking: Reported on 12/12/2021 04/17/21   [provider]  ibuprofen (ADVIL) 800 MG tablet Take 1 tablet (800 mg total) by mouth every 8 (eight) hours as needed. Patient not taking: Reported on 12/12/2021 08/30/21   Barbette Merino, NP  ketorolac (TORADOL) 30 MG/ML injection Inject 30 mg into the muscle once. Patient not taking: Reported on 01/15/2022 04/17/21   [provider]  levocetirizine (XYZAL) 5 MG tablet Take 1 tablet (5 mg total) by mouth at bedtime. 01/15/22   Ivonne Andrew, NP  LINZESS 72 MCG capsule Take 1 capsule (72 mcg total) by mouth daily before breakfast. 09/07/21   Barbette Merino, NP  predniSONE (DELTASONE) 20 MG tablet Take 60 mg by mouth daily. Patient not taking: Reported on 12/12/2021 09/19/21   [provider]  zolpidem (AMBIEN) 10 MG tablet Take 1 tablet (10 mg  total) by mouth at bedtime. 11/01/21   Passmore, Enid Derry I, NP  cetirizine (ZYRTEC) 10 MG tablet Take 1 tablet (10 mg total) by mouth daily. 03/17/19 04/20/20  Massie Maroon, FNP    Family History Family History  Problem Relation Age of Onset   Hypertension Mother    Stroke Sister    Seizures Sister    Colon cancer Maternal Aunt    Stomach cancer Maternal Uncle    Colon cancer Maternal Grandmother    Colon polyps Neg Hx    Esophageal cancer Neg Hx    Rectal cancer Neg Hx     Social History Social History   Tobacco Use   Smoking status: Former    Packs/day: 0.75    Types: Cigarettes    Quit date: 11/17/1988    Years since quitting: 33.2   Smokeless tobacco: Never  Vaping Use   Vaping Use: Never used  Substance Use Topics   Alcohol use: No   Drug use: No     Allergies   Mobic [meloxicam] and Shrimp [shellfish allergy]   Review of Systems Review of Systems  Constitutional: Negative.   HENT: Negative.    Respiratory: Negative.    Cardiovascular: Negative.   Neurological:  Positive for dizziness and headaches. Negative for tremors, seizures, syncope, facial asymmetry, speech difficulty, weakness, light-headedness and numbness.     Physical Exam Triage Vital Signs ED Triage Vitals  Enc Vitals Group     BP 02/04/22 1827 (!) 152/92     Pulse Rate 02/04/22 1827 70     Resp 02/04/22 1827 20     Temp 02/04/22 1827 98.6 F (37 C)     Temp src --      SpO2 02/04/22 1827 94 %     Weight --      Height --      Head Circumference --      Peak Flow --      Pain Score 02/04/22 1826 10     Pain Loc --      Pain Edu? --      Excl. in GC? --    No data found.  Updated Vital Signs BP (!) 152/92   Pulse 70   Temp 98.6 F (37 C)   Resp 20   LMP 09/04/2018   SpO2 94%   Visual Acuity Right Eye Distance:   Left Eye Distance:   Bilateral Distance:    Right Eye Near:   Left Eye Near:    Bilateral Near:     Physical Exam Constitutional:      Appearance:  Normal appearance.  Eyes:     Extraocular Movements: Extraocular movements  intact.     Conjunctiva/sclera: Conjunctivae normal.     Pupils: Pupils are equal, round, and reactive to light.  Pulmonary:     Effort: Pulmonary effort is normal.  Skin:    General: Skin is warm and dry.  Neurological:     General: No focal deficit present.     Mental Status: She is alert and oriented to person, place, and time. Mental status is at baseline.     Cranial Nerves: No cranial nerve deficit.     Sensory: No sensory deficit.     Motor: No weakness.     Gait: Gait normal.  Psychiatric:        Mood and Affect: Mood normal.        Behavior: Behavior normal.      UC Treatments / Results  Labs (all labs ordered are listed, but only abnormal results are displayed) Labs Reviewed  CBG MONITORING, ED - Abnormal; Notable for the following components:      Result Value   Glucose-Capillary 115 (*)    All other components within normal limits    EKG   Radiology No results found.  Procedures Procedures (including critical care time)  Medications Ordered in UC Medications - No data to display  Initial Impression / Assessment and Plan / UC Course  I have reviewed the triage vital signs and the nursing notes.  Pertinent labs & imaging results that were available during my care of the patient were reviewed by me and considered in my medical decision making (see chart for details).  Bad headache  Vital signs are stable, patient is in no signs of distress, no abnormalities noted on exam, will move forward with treatment of headache, Decadron, Toradol and Imitrex injections given in office, prescribed ibuprofen 800 mg and meclizine outpatient, recommended low stimulation activities while headache is present and ensuring adequate hydration and rest, get vision strict precautions for persisting headaches to follow-up with her PCP in 1 week and for a worsening headache in the intensity to go to the  nearest emergency department for further evaluation and management Final Clinical Impressions(s) / UC Diagnoses   Final diagnoses:  None   Discharge Instructions   None    ED Prescriptions   None    PDMP not reviewed this encounter.   Hans Eden, [place] 02/07/22 [phone]

## 2022-02-09 ENCOUNTER — Encounter (HOSPITAL_BASED_OUTPATIENT_CLINIC_OR_DEPARTMENT_OTHER): Payer: Self-pay

## 2022-02-09 ENCOUNTER — Other Ambulatory Visit (HOSPITAL_BASED_OUTPATIENT_CLINIC_OR_DEPARTMENT_OTHER): Payer: Self-pay

## 2022-02-09 ENCOUNTER — Emergency Department (HOSPITAL_BASED_OUTPATIENT_CLINIC_OR_DEPARTMENT_OTHER)
Admission: EM | Admit: 2022-02-09 | Discharge: 2022-02-09 | Disposition: A | Discharge status: Home / Self Care | Payer: BC Managed Care – PPO | Attending: Emergency Medicine | Admitting: Emergency Medicine

## 2022-02-09 ENCOUNTER — Other Ambulatory Visit: Payer: Self-pay

## 2022-02-09 DIAGNOSIS — R7309 Other abnormal glucose: Secondary | ICD-10-CM | POA: Insufficient documentation

## 2022-02-09 DIAGNOSIS — M545 Low back pain, unspecified: Secondary | ICD-10-CM | POA: Diagnosis not present

## 2022-02-09 DIAGNOSIS — S39012A Strain of muscle, fascia and tendon of lower back, initial encounter: Secondary | ICD-10-CM

## 2022-02-09 LAB — URINALYSIS, ROUTINE W REFLEX MICROSCOPIC
Bilirubin Urine: NEGATIVE
Glucose, UA: NEGATIVE mg/dL
Hgb urine dipstick: NEGATIVE
Ketones, ur: NEGATIVE mg/dL
Nitrite: NEGATIVE
Specific Gravity, Urine: 1.025 (ref 1.005–1.030)
pH: 6.5 (ref 5.0–8.0)

## 2022-02-09 LAB — CBG MONITORING, ED: Glucose-Capillary: 114 mg/dL — ABNORMAL HIGH (ref 70–99)

## 2022-02-09 MED ORDER — LIDOCAINE 5 % EX PTCH
1.0000 | MEDICATED_PATCH | CUTANEOUS | 0 refills | Status: DC
Start: 2022-02-09 — End: 2022-04-12
  Filled 2022-02-09 (×2): qty 30, 30d supply, fill #0

## 2022-02-09 MED ORDER — OXYCODONE-ACETAMINOPHEN 5-325 MG PO TABS
1.0000 | ORAL_TABLET | Freq: Once | ORAL | Status: AC
  Administered 2022-02-09: 1 via ORAL
  Filled 2022-02-09: qty 1

## 2022-02-09 MED ORDER — DIAZEPAM 5 MG PO TABS
5.0000 mg | ORAL_TABLET | Freq: Once | ORAL | Status: AC
  Administered 2022-02-09: 5 mg via ORAL
  Filled 2022-02-09: qty 1

## 2022-02-09 MED ORDER — METHOCARBAMOL 500 MG PO TABS
500.0000 mg | ORAL_TABLET | Freq: Two times a day (BID) | ORAL | 0 refills | Status: DC
  Filled 2022-02-09: qty 20, 10d supply, fill #0

## 2022-02-09 NOTE — ED Notes (Signed)
Discharge instructions, follow up care, and prescriptions reviewed and explained, pt verbalized understanding. Pt states her pain has subsided and denied any pain on d/c. Pt caox4 and ambulatory on departure.

## 2022-02-09 NOTE — ED Triage Notes (Signed)
Pt presents POV with Left side back pain radiating to her mid lower back area x4 days. Pt denies hx of kidney stones, no burning with urination, urgency, frequency or hematuria.

## 2022-02-09 NOTE — ED Provider Notes (Signed)
[facility] EMERGENCY DEPT Provider Note   CSN: [id number] Arrival date & time: 02/09/22  1029     History  Chief Complaint  Patient presents with   Back Pain    Wendy James is a 51 y.o. female.  51 year old female presents with bilateral lower back pain.  Patient states that the pain is sharp and does not radiate down her legs.  It is worse with certain positions.  Denies any urinary symptoms.  No bowel or bladder dysfunction.  No fever or chills.  No prior history of same      Home Medications Prior to Admission medications   Medication Sig Start Date End Date Taking? Authorizing Provider  albuterol (PROVENTIL) (2.5 MG/3ML) 0.083% nebulizer solution Take 3 mLs (2.5 mg total) by nebulization every 6 (six) hours as needed for wheezing or shortness of breath. 11/01/21   Passmore, Enid Derry I, NP  albuterol (VENTOLIN HFA) 108 (90 Base) MCG/ACT inhaler Inhale 1-2 puffs into the lungs every 4 (four) hours as needed for wheezing or shortness of breath. 01/15/22 01/15/23  Ivonne Andrew, NP  amLODipine-olmesartan (AZOR) 10-40 MG tablet Take 1 tablet by mouth daily. 08/10/21   Barbette Merino, NP  budesonide-formoterol (SYMBICORT) 80-4.5 MCG/ACT inhaler Inhale 2 puffs into the lungs 2 (two) times daily. 09/21/21   Barbette Merino, NP  diclofenac (VOLTAREN) 75 MG EC tablet Take 75 mg by mouth 2 (two) times daily. Patient not taking: Reported on 12/12/2021 04/17/21   [provider]  ibuprofen (ADVIL) 800 MG tablet Take 1 tablet (800 mg total) by mouth 3 (three) times daily. 02/04/22   White, Elita Boone, NP  ketorolac (TORADOL) 30 MG/ML injection Inject 30 mg into the muscle once. Patient not taking: Reported on 01/15/2022 04/17/21   [provider]  levocetirizine (XYZAL) 5 MG tablet Take 1 tablet (5 mg total) by mouth at bedtime. 01/15/22   Ivonne Andrew, NP  LINZESS 72 MCG capsule Take 1 capsule (72 mcg total) by mouth daily before breakfast. 09/07/21   Barbette Merino, NP  meclizine (ANTIVERT) 12.5 MG tablet Take 1 tablet (12.5 mg total) by mouth 3 (three) times daily as needed for dizziness. 02/04/22   White, Elita Boone, NP  predniSONE (DELTASONE) 20 MG tablet Take 60 mg by mouth daily. Patient not taking: Reported on 12/12/2021 09/19/21   [provider]  zolpidem (AMBIEN) 10 MG tablet Take 1 tablet (10 mg total) by mouth at bedtime. 11/01/21   Passmore, Enid Derry I, NP  cetirizine (ZYRTEC) 10 MG tablet Take 1 tablet (10 mg total) by mouth daily. 03/17/19 04/20/20  Massie Maroon, FNP      Allergies    Mobic [meloxicam] and Shrimp [shellfish allergy]    Review of Systems   Review of Systems  All other systems reviewed and are negative.   Physical Exam Updated Vital Signs BP (!) 159/97   Pulse 64   Temp 99.2 F (37.3 C) (Oral)   Resp 18   LMP 09/04/2018   SpO2 96%  Physical Exam Vitals and nursing note reviewed.  Constitutional:      General: She is not in acute distress.    Appearance: Normal appearance. She is well-developed. She is not toxic-appearing.  HENT:     Head: Normocephalic and atraumatic.  Eyes:     General: Lids are normal.     Conjunctiva/sclera: Conjunctivae normal.     Pupils: Pupils are equal, round, and reactive to light.  Neck:  Thyroid: No thyroid mass.     Trachea: No tracheal deviation.  Cardiovascular:     Rate and Rhythm: Normal rate and regular rhythm.     Heart sounds: Normal heart sounds. No murmur heard.    No gallop.  Pulmonary:     Effort: Pulmonary effort is normal. No respiratory distress.     Breath sounds: Normal breath sounds. No stridor. No decreased breath sounds, wheezing, rhonchi or rales.  Abdominal:     General: There is no distension.     Palpations: Abdomen is soft.     Tenderness: There is no abdominal tenderness. There is no rebound.  Musculoskeletal:        General: No tenderness. Normal range of motion.     Cervical back: Normal range of motion and neck supple.        Back:  Skin:    General: Skin is warm and dry.     Findings: No abrasion or rash.  Neurological:     General: No focal deficit present.     Mental Status: She is alert and oriented to person, place, and time. Mental status is at baseline.     GCS: GCS eye subscore is 4. GCS verbal subscore is 5. GCS motor subscore is 6.     Cranial Nerves: No cranial nerve deficit.     Sensory: No sensory deficit.     Motor: Motor function is intact.     Gait: Gait is intact.  Psychiatric:        Attention and Perception: Attention normal.        Speech: Speech normal.        Behavior: Behavior normal.    ED Results / Procedures / Treatments   Labs (all labs ordered are listed, but only abnormal results are displayed) Labs Reviewed  URINALYSIS, ROUTINE W REFLEX MICROSCOPIC    EKG None  Radiology No results found.  Procedures Procedures    Medications Ordered in ED Medications  oxyCODONE-acetaminophen (PERCOCET/ROXICET) 5-325 MG per tablet 1 tablet (has no administration in time range)  diazepam (VALIUM) tablet 5 mg (has no administration in time range)    ED Course/ Medical Decision Making/ A&P                           Medical Decision Making Amount and/or Complexity of Data Reviewed Labs: ordered.  Risk Prescription drug management.   Patient medicated for likely muscle strain here and feels better.  Her urinalysis was noted and likely contaminated.  She has no UTI symptoms.  Will not treat at this time.  Plan will be to place patient on muscle relaxants and pain medications and discharged home        Final Clinical Impression(s) / ED Diagnoses Final diagnoses:  None    Rx / DC Orders ED Discharge Orders     None         Lorre Nick, MD 02/09/22 1135

## 2022-02-10 ENCOUNTER — Emergency Department (HOSPITAL_BASED_OUTPATIENT_CLINIC_OR_DEPARTMENT_OTHER): Payer: BC Managed Care – PPO

## 2022-02-10 ENCOUNTER — Encounter (HOSPITAL_BASED_OUTPATIENT_CLINIC_OR_DEPARTMENT_OTHER): Payer: Self-pay | Admitting: Emergency Medicine

## 2022-02-10 ENCOUNTER — Emergency Department (HOSPITAL_BASED_OUTPATIENT_CLINIC_OR_DEPARTMENT_OTHER)
Admission: EM | Admit: 2022-02-10 | Discharge: 2022-02-10 | Disposition: A | Discharge status: Home / Self Care | Payer: BC Managed Care – PPO | Attending: Emergency Medicine | Admitting: Emergency Medicine

## 2022-02-10 DIAGNOSIS — Z79899 Other long term (current) drug therapy: Secondary | ICD-10-CM | POA: Diagnosis not present

## 2022-02-10 DIAGNOSIS — I10 Essential (primary) hypertension: Secondary | ICD-10-CM | POA: Insufficient documentation

## 2022-02-10 DIAGNOSIS — R109 Unspecified abdominal pain: Secondary | ICD-10-CM

## 2022-02-10 DIAGNOSIS — M549 Dorsalgia, unspecified: Secondary | ICD-10-CM | POA: Diagnosis not present

## 2022-02-10 DIAGNOSIS — R1032 Left lower quadrant pain: Secondary | ICD-10-CM | POA: Diagnosis present

## 2022-02-10 DIAGNOSIS — J45909 Unspecified asthma, uncomplicated: Secondary | ICD-10-CM | POA: Diagnosis not present

## 2022-02-10 LAB — COMPREHENSIVE METABOLIC PANEL
ALT: 27 U/L (ref 0–44)
AST: 22 U/L (ref 15–41)
Albumin: 4.1 g/dL (ref 3.5–5.0)
Alkaline Phosphatase: 59 U/L (ref 38–126)
Anion gap: 6 (ref 5–15)
BUN: 11 mg/dL (ref 6–20)
CO2: 29 mmol/L (ref 22–32)
Calcium: 9.3 mg/dL (ref 8.9–10.3)
Chloride: 104 mmol/L (ref 98–111)
Creatinine, Ser: 0.62 mg/dL (ref 0.44–1.00)
GFR, Estimated: >60 mL/min (ref >60)
Glucose, Bld: 107 mg/dL — ABNORMAL HIGH (ref 70–99)
Potassium: 3.8 mmol/L (ref 3.5–5.1)
Sodium: 139 mmol/L (ref 135–145)
Total Bilirubin: 1.1 mg/dL (ref 0.3–1.2)
Total Protein: 6.7 g/dL (ref 6.5–8.1)

## 2022-02-10 LAB — CBC WITH DIFFERENTIAL/PLATELET
Abs Immature Granulocytes: 0 10*3/uL (ref 0.00–0.07)
Basophils Absolute: 0 10*3/uL (ref 0.0–0.1)
Basophils Relative: 1 %
Eosinophils Absolute: 0.1 10*3/uL (ref 0.0–0.5)
Eosinophils Relative: 4 %
HCT: 40.2 % (ref 36.0–46.0)
Hemoglobin: 13.4 g/dL (ref 12.0–15.0)
Immature Granulocytes: 0 %
Lymphocytes Relative: 50 %
Lymphs Abs: 1.9 10*3/uL (ref 0.7–4.0)
MCH: 29.8 pg (ref 26.0–34.0)
MCHC: 33.3 g/dL (ref 30.0–36.0)
MCV: 89.5 fL (ref 80.0–100.0)
Monocytes Absolute: 0.3 10*3/uL (ref 0.1–1.0)
Monocytes Relative: 9 %
Neutro Abs: 1.3 10*3/uL — ABNORMAL LOW (ref 1.7–7.7)
Neutrophils Relative %: 36 %
Platelets: 204 10*3/uL (ref 150–400)
RBC: 4.49 MIL/uL (ref 3.87–5.11)
RDW: 12.7 % (ref 11.5–15.5)
WBC: 3.8 10*3/uL — ABNORMAL LOW (ref 4.0–10.5)
nRBC: 0 % (ref 0.0–0.2)

## 2022-02-10 LAB — URINALYSIS, ROUTINE W REFLEX MICROSCOPIC
Bilirubin Urine: NEGATIVE
Glucose, UA: NEGATIVE mg/dL
Hgb urine dipstick: NEGATIVE
Ketones, ur: NEGATIVE mg/dL
Leukocytes,Ua: NEGATIVE
Nitrite: NEGATIVE
Specific Gravity, Urine: 1.024 (ref 1.005–1.030)
pH: 5.5 (ref 5.0–8.0)

## 2022-02-10 LAB — LIPASE, BLOOD: Lipase: 37 U/L (ref 11–51)

## 2022-02-10 MED ORDER — OXYCODONE HCL 5 MG PO TABS: 5.0000 mg | ORAL_TABLET | ORAL | 0 refills | Status: DC | PRN

## 2022-02-10 MED ORDER — LACTATED RINGERS IV BOLUS
1000.0000 mL | Freq: Once | INTRAVENOUS | Status: AC
  Administered 2022-02-10: 1000 mL via INTRAVENOUS

## 2022-02-10 MED ORDER — KETOROLAC TROMETHAMINE 30 MG/ML IJ SOLN
30.0000 mg | Freq: Once | INTRAMUSCULAR | Status: AC
  Administered 2022-02-10: 30 mg via INTRAVENOUS
  Filled 2022-02-10: qty 1

## 2022-02-10 NOTE — ED Provider Notes (Signed)
[facility] EMERGENCY DEPT Provider Note   CSN: [id number] Arrival date & time: 02/10/22  5102     History  Chief Complaint  Patient presents with   Urinary Frequency    Wendy James is a 51 y.o. female.  HPI     Last night had urinary frequency, a little would come out, left side having pain, left sided pressure lower abdomen Yesterday came to ED for back pain Back pain left sided, constant, feels it in sleep. Muscle relaxant helped ease it a little.  Has been there for one week.  When got up and started moving around the pain was more intense in left sided abdomen and back.  Pain is worse with movement. 10/10 pain.  No nausea or vomiting No diarrhea or constipation No vaginal discharge or bleeding, no foul odor No hx of nephrolithiasis, has not had ovarian cysts a in a long time.  Took gb out No numbness/weakness/loss control bowel or bladder, did have a little bit of urine the other day when couldn't make it to bathroom on time No falls or trauma No new meds other than muscle relaxant yesterday    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   Asthma    Bone spur of foot    Hypertension    controlled    Seasonal allergies     Past Surgical History:  Procedure Laterality Date   CESAREAN SECTION     x 4   CHOLECYSTECTOMY     HEEL SPUR EXCISION     08/2017   HYSTEROSCOPY WITH D & C N/A 02/18/2015   Procedure: DILATATION AND CURETTAGE /HYSTEROSCOPY;  Surgeon: Allie Bossier, MD;  Location: [facility];  Service: Gynecology;  Laterality: N/A;   TUBAL LIGATION      Home Medications Prior to Admission medications   Medication Sig Start Date End Date Taking? Authorizing Provider  oxyCODONE (ROXICODONE) 5 MG immediate release tablet Take 1 tablet (5 mg total) by mouth every 4 (four) hours as needed for severe pain. 02/10/22  Yes Alvira Monday, MD  albuterol (PROVENTIL) (2.5 MG/3ML) 0.083% nebulizer solution Take 3 mLs  (2.5 mg total) by nebulization every 6 (six) hours as needed for wheezing or shortness of breath. 11/01/21   Passmore, Enid Derry I, NP  albuterol (VENTOLIN HFA) 108 (90 Base) MCG/ACT inhaler Inhale 1-2 puffs into the lungs every 4 (four) hours as needed for wheezing or shortness of breath. 01/15/22 01/15/23  Ivonne Andrew, NP  amLODipine-olmesartan (AZOR) 10-40 MG tablet Take 1 tablet by mouth daily. 08/10/21   Barbette Merino, NP  budesonide-formoterol (SYMBICORT) 80-4.5 MCG/ACT inhaler Inhale 2 puffs into the lungs 2 (two) times daily. 09/21/21   Barbette Merino, NP  diclofenac (VOLTAREN) 75 MG EC tablet Take 75 mg by mouth 2 (two) times daily. Patient not taking: Reported on 12/12/2021 04/17/21   [provider]  ibuprofen (ADVIL) 800 MG tablet Take 1 tablet (800 mg total) by mouth 3 (three) times daily. 02/04/22   White, Elita Boone, NP  ketorolac (TORADOL) 30 MG/ML injection Inject 30 mg into the muscle once. Patient not taking: Reported on 01/15/2022 04/17/21   [provider]  levocetirizine (XYZAL) 5 MG tablet Take 1 tablet (5 mg total) by mouth at bedtime. 01/15/22   Ivonne Andrew, NP  lidocaine (LIDODERM) 5 % Place 1 patch onto the skin daily. Remove & Discard patch within 12 hours or  as directed by MD 02/09/22   Lorre Nick, MD  LINZESS 72 MCG capsule Take 1 capsule (72 mcg total) by mouth daily before breakfast. 09/07/21   Barbette Merino, NP  meclizine (ANTIVERT) 12.5 MG tablet Take 1 tablet (12.5 mg total) by mouth 3 (three) times daily as needed for dizziness. 02/04/22   White, Elita Boone, NP  methocarbamol (ROBAXIN) 500 MG tablet Take 1 tablet (500 mg total) by mouth 2 (two) times daily. 02/09/22   Lorre Nick, MD  predniSONE (DELTASONE) 20 MG tablet Take 60 mg by mouth daily. Patient not taking: Reported on 12/12/2021 09/19/21   [provider]  zolpidem (AMBIEN) 10 MG tablet Take 1 tablet (10 mg total) by mouth at bedtime. 11/01/21   Passmore, Enid Derry I, NP  cetirizine  (ZYRTEC) 10 MG tablet Take 1 tablet (10 mg total) by mouth daily. 03/17/19 04/20/20  Massie Maroon, FNP      Allergies    Mobic [meloxicam] and Shrimp [shellfish allergy]    Review of Systems   Review of Systems  Physical Exam Updated Vital Signs BP (!) 148/91 (BP Location: Right Arm)   Pulse (!) 55   Temp 98.2 F (36.8 C) (Oral)   Resp 16   LMP 09/04/2018   SpO2 99%  Physical Exam Vitals and nursing note reviewed.  Constitutional:      General: She is not in acute distress.    Appearance: Normal appearance. She is not ill-appearing, toxic-appearing or diaphoretic.  HENT:     Head: Normocephalic.  Eyes:     Conjunctiva/sclera: Conjunctivae normal.  Cardiovascular:     Rate and Rhythm: Normal rate and regular rhythm.     Pulses: Normal pulses.  Pulmonary:     Effort: Pulmonary effort is normal.  Abdominal:     Tenderness: There is no abdominal tenderness. There is left CVA tenderness (tenderness left flank/lower bac).     Hernia: No hernia is present.  Musculoskeletal:        General: No deformity or signs of injury.     Cervical back: No rigidity.  Skin:    General: Skin is warm and dry.     Coloration: Skin is not jaundiced or pale.  Neurological:     General: No focal deficit present.     Mental Status: She is alert and oriented to person, place, and time.     ED Results / Procedures / Treatments   Labs (all labs ordered are listed, but only abnormal results are displayed) Labs Reviewed  URINALYSIS, ROUTINE W REFLEX MICROSCOPIC - Abnormal; Notable for the following components:      Result Value   Protein, ur TRACE (*)    All other components within normal limits  CBC WITH DIFFERENTIAL/PLATELET - Abnormal; Notable for the following components:   WBC 3.8 (*)    Neutro Abs 1.3 (*)    All other components within normal limits  COMPREHENSIVE METABOLIC PANEL - Abnormal; Notable for the following components:   Glucose, Bld 107 (*)    All other components  within normal limits  LIPASE, BLOOD    EKG None  Radiology CT Renal Stone Study  Result Date: 02/10/2022 CLINICAL DATA:  Left flank pain.  Dysuria. EXAM: CT ABDOMEN AND PELVIS WITHOUT CONTRAST TECHNIQUE: Multidetector CT imaging of the abdomen and pelvis was performed following the standard protocol without IV contrast. RADIATION DOSE REDUCTION: This exam was performed according to the departmental dose-optimization program which includes automated exposure control, adjustment of the mA  and/or kV according to patient size and/or use of iterative reconstruction technique. COMPARISON:  12/17/2011 FINDINGS: Lower chest: No acute findings. Hepatobiliary: No mass visualized on this unenhanced exam. Tiny fluid attenuation cyst again noted in the liver dome. Prior cholecystectomy. No evidence of biliary obstruction. Pancreas: No mass or inflammatory process visualized on this unenhanced exam. Spleen:  Within normal limits in size. Adrenals/Urinary tract: No evidence of urolithiasis or hydronephrosis. Unremarkable unopacified urinary bladder. Stomach/Bowel: No evidence of obstruction, inflammatory process, or abnormal fluid collections. Normal appendix visualized. Vascular/Lymphatic: No pathologically enlarged lymph nodes identified. No evidence of abdominal aortic aneurysm. Reproductive:  No mass or other significant abnormality. Other:  None. Musculoskeletal:  No suspicious bone lesions identified. IMPRESSION: No evidence of urolithiasis, hydronephrosis, or other acute findings. Electronically Signed   By: Danae Orleans M.D.   On: 02/10/2022 10:45    Procedures Procedures    Medications Ordered in ED Medications  lactated ringers bolus 1,000 mL (0 mLs Intravenous Stopped 02/10/22 1110)  ketorolac (TORADOL) 30 MG/ML injection 30 mg (30 mg Intravenous Given 02/10/22 1010)    ED Course/ Medical Decision Making/ A&P                           Medical Decision Making Amount and/or Complexity of Data  Reviewed Labs: ordered. Radiology: ordered.  Risk Prescription drug management.   51yo female with history of hypertension, arthritis presents with concern for left abdominal pain and flank pain.   DDx includes appendicitis, pancreatitis, pyelonephritis, nephrolithiasis, diverticulitis, PID, ovarian torsion, ectopic pregnancy, and tuboovarian abscess.  Labs personally reviewed and interpreted by me showed no sign of pancreatitis, hepatitis, anemia.   UA not consistent with infection.   CT stone study showed no evidence of nephrolithiasis or other acute findings. Low suspicion for torsion with primarily back pain, no sign of abnormalities on CT of ovaries. Post menopausal and do not suspect other pelvic etiology of pain. Suspect likely msk etiology of back pain/flank pain, and also consider other interstitial cystitis.  She has been taking muscle relaxant, tylenol, ibuprofen without relief. Will give short rx for oxycodone for pain after review in [place] drug database.            Final Clinical Impression(s) / ED Diagnoses Final diagnoses:  Flank pain    Rx / DC Orders ED Discharge Orders          Ordered    oxyCODONE (ROXICODONE) 5 MG immediate release tablet  Every 4 hours PRN        02/10/22 1143              Alvira Monday, MD 02/10/22 2315

## 2022-02-10 NOTE — ED Notes (Signed)
Discharge paperwork given and understood. 

## 2022-02-10 NOTE — ED Notes (Addendum)
Bladder scan 64ml after urinating.

## 2022-02-10 NOTE — ED Triage Notes (Signed)
Pt states she has pain in her left flank, difficulty urinating, burns with urination and urge to void frequently.

## 2022-02-22 ENCOUNTER — Other Ambulatory Visit: Payer: Self-pay

## 2022-02-22 ENCOUNTER — Telehealth: Payer: Self-pay

## 2022-02-22 ENCOUNTER — Other Ambulatory Visit: Payer: Self-pay | Admitting: Nurse Practitioner

## 2022-02-22 DIAGNOSIS — G4700 Insomnia, unspecified: Secondary | ICD-10-CM

## 2022-02-22 MED ORDER — ZOLPIDEM TARTRATE 10 MG PO TABS: 10.0000 mg | ORAL_TABLET | Freq: Every day | ORAL | 0 refills | Status: DC

## 2022-02-22 NOTE — Telephone Encounter (Signed)
Sent to provider 

## 2022-02-22 NOTE — Telephone Encounter (Signed)
Pt need sleep med's to be refilled

## 2022-03-21 ENCOUNTER — Encounter: Payer: Self-pay | Admitting: Nurse Practitioner

## 2022-03-21 ENCOUNTER — Ambulatory Visit (INDEPENDENT_AMBULATORY_CARE_PROVIDER_SITE_OTHER): Payer: BC Managed Care – PPO | Admitting: Nurse Practitioner

## 2022-03-21 VITALS — BP 148/93 | HR 63 | Temp 97.8°F | Ht 62.0 in | Wt 213.2 lb

## 2022-03-21 DIAGNOSIS — I10 Essential (primary) hypertension: Secondary | ICD-10-CM | POA: Diagnosis not present

## 2022-03-21 MED ORDER — HYDROXYZINE HCL 10 MG PO TABS: 10.0000 mg | ORAL_TABLET | Freq: Three times a day (TID) | ORAL | 0 refills | Status: AC | PRN

## 2022-03-21 NOTE — Patient Instructions (Addendum)
1. Essential hypertension  Continue current medication  Stay active  Low salt diet.  Follow up:  Follow up in 3 months or sooner if needed

## 2022-03-21 NOTE — Progress Notes (Signed)
@Patient  ID: , female    DOB: 1970-10-21, 51 y.o.   MRN: [id number]  Chief Complaint  Patient presents with   Follow-up    Pt is here for 3 month's follow up.    Referring provider: [id number], NP   HPI  Wendy James 51 y.o. female  has a past medical history of Allergy, Arthritis, Asthma, Bone spur of foot, Hypertension, and Seasonal allergies. To the [facility] for reevaluation of B/P.    Hypertension: Patient here for follow-up of elevated blood pressure. She is exercising and is adherent to low salt diet.  Patient does not regularly check B/P at home. States that blood pressure at work was 140/80.  Cardiac symptoms none. Patient denies chest pain, claudication, fatigue, and orthopnea.  Cardiovascular risk factors: none. Use of agents associated with hypertension: none. History of target organ damage: none. currently taking amlodipine-olmesartan. Has started an exercise routine. States that she has lost weight. She is feeling much better.   Denies f/c/s, n/v/d, hemoptysis, PND, leg swelling Denies chest pain or edema    Allergies  Allergen Reactions   Mobic [Meloxicam] Hives    Pt reports she gets real bad hives from Mobic   Shrimp [Shellfish Allergy] Other (See Comments)    Wheezing.  Patient states she is ok with betadine    Immunization History  Administered Date(s) Administered   Influenza Split 04/11/2012   Influenza,inj,Quad PF,6+ Mos 04/30/2015, 06/20/2020   PFIZER(Purple Top)SARS-COV-2 Vaccination 09/28/2019, 10/29/2019   Pneumococcal Polysaccharide-23 04/30/2015   Tdap 02/14/2018    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   Asthma    Bone spur of foot    Hypertension    controlled    Seasonal allergies     Tobacco History: Social History   Tobacco Use  Smoking Status Former   Packs/day: 0.75   Types: Cigarettes   Quit date: 11/17/1988   Years since quitting: 33.3  Smokeless Tobacco Never    Counseling given: Not Answered   Outpatient Encounter Medications as of 03/21/2022  Medication Sig   albuterol (PROVENTIL) (2.5 MG/3ML) 0.083% nebulizer solution Take 3 mLs (2.5 mg total) by nebulization every 6 (six) hours as needed for wheezing or shortness of breath.   albuterol (VENTOLIN HFA) 108 (90 Base) MCG/ACT inhaler Inhale 1-2 puffs into the lungs every 4 (four) hours as needed for wheezing or shortness of breath.   amLODipine-olmesartan (AZOR) 10-40 MG tablet Take 1 tablet by mouth daily.   budesonide-formoterol (SYMBICORT) 80-4.5 MCG/ACT inhaler Inhale 2 puffs into the lungs 2 (two) times daily.   hydrOXYzine (ATARAX) 10 MG tablet Take 1 tablet (10 mg total) by mouth 3 (three) times daily as needed.   lidocaine (LIDODERM) 5 % Place 1 patch onto the skin daily. Remove & Discard patch within 12 hours or as directed by MD   oxyCODONE (ROXICODONE) 5 MG immediate release tablet Take 1 tablet (5 mg total) by mouth every 4 (four) hours as needed for severe pain.   zolpidem (AMBIEN) 10 MG tablet Take 1 tablet (10 mg total) by mouth at bedtime.   diclofenac (VOLTAREN) 75 MG EC tablet Take 75 mg by mouth 2 (two) times daily. (Patient not taking: Reported on 12/12/2021)   ibuprofen (ADVIL) 800 MG tablet Take 1 tablet (800 mg total) by mouth 3 (three) times daily.   ketorolac (TORADOL) 30 MG/ML injection Inject 30 mg into the muscle once. (Patient not  taking: Reported on 01/15/2022)   LINZESS 72 MCG capsule Take 1 capsule (72 mcg total) by mouth daily before breakfast. (Patient not taking: Reported on 03/21/2022)   methocarbamol (ROBAXIN) 500 MG tablet Take 1 tablet (500 mg total) by mouth 2 (two) times daily. (Patient not taking: Reported on 03/21/2022)   predniSONE (DELTASONE) 20 MG tablet Take 60 mg by mouth daily. (Patient not taking: Reported on 12/12/2021)   [DISCONTINUED] cetirizine (ZYRTEC) 10 MG tablet Take 1 tablet (10 mg total) by mouth daily.   [DISCONTINUED] levocetirizine (XYZAL) 5 MG  tablet Take 1 tablet (5 mg total) by mouth at bedtime. (Patient not taking: Reported on 03/21/2022)   [DISCONTINUED] meclizine (ANTIVERT) 12.5 MG tablet Take 1 tablet (12.5 mg total) by mouth 3 (three) times daily as needed for dizziness. (Patient not taking: Reported on 03/21/2022)   No facility-administered encounter medications on file as of 03/21/2022.     Review of Systems  Review of Systems  Constitutional: Negative.   HENT: Negative.    Cardiovascular: Negative.   Gastrointestinal: Negative.   Allergic/Immunologic: Negative.   Neurological: Negative.   Psychiatric/Behavioral: Negative.         Physical Exam  BP (!) 148/93   Pulse 63   Temp 97.8 F (36.6 C)   Ht 5\' 2"  (1.575 m)   Wt 213 lb 4 oz (96.7 kg)   SpO2 100%   BMI 39.00 kg/m   Wt Readings from Last 5 Encounters:  03/21/22 213 lb 4 oz (96.7 kg)  01/15/22 215 lb 3.2 oz (97.6 kg)  12/12/21 215 lb 12.8 oz (97.9 kg)  09/21/21 216 lb 0.2 oz (98 kg)  09/07/21 218 lb (98.9 kg)     Physical Exam Vitals and nursing note reviewed.  Constitutional:      General: She is not in acute distress.    Appearance: She is well-developed.  Cardiovascular:     Rate and Rhythm: Normal rate and regular rhythm.  Pulmonary:     Effort: Pulmonary effort is normal.     Breath sounds: Normal breath sounds.  Neurological:     Mental Status: She is alert and oriented to person, place, and time.       Assessment & Plan:   Essential hypertension Continue current medication  Stay active  Low salt diet.  Follow up:  Follow up in 3 months or sooner if needed     11/05/21, NP 03/21/2022

## 2022-03-21 NOTE — Assessment & Plan Note (Signed)
Continue current medication  Stay active  Low salt diet.  Follow up:  Follow up in 3 months or sooner if needed

## 2022-03-23 ENCOUNTER — Telehealth: Payer: Self-pay | Admitting: Nurse Practitioner

## 2022-03-23 NOTE — Telephone Encounter (Signed)
Sleep medication refill request

## 2022-03-27 NOTE — Addendum Note (Signed)
Addended by: Nilda Simmer T on: 03/27/2022 09:07 AM   Modules accepted: Orders

## 2022-03-28 ENCOUNTER — Other Ambulatory Visit: Payer: Self-pay | Admitting: Nurse Practitioner

## 2022-03-29 ENCOUNTER — Telehealth: Payer: Self-pay | Admitting: Pharmacist

## 2022-03-29 NOTE — Progress Notes (Signed)
Contacted patient regarding referral for hypertension from Nichols, Tonya S, NP .   Appointment scheduled   Catie T. Quientin Jent, PharmD, BCACP [facility] [phone]  

## 2022-04-04 ENCOUNTER — Other Ambulatory Visit: Payer: BC Managed Care – PPO | Admitting: Pharmacist

## 2022-04-06 ENCOUNTER — Telehealth: Payer: Self-pay | Admitting: Pharmacist

## 2022-04-06 ENCOUNTER — Other Ambulatory Visit: Payer: BC Managed Care – PPO | Admitting: Pharmacist

## 2022-04-06 NOTE — Progress Notes (Signed)
Contacted patient regarding referral for hypertension from Ivonne Andrew, NP .   Unable to leave voicemail. Will also send MyChart  Catie Eppie Gibson, PharmD, [facility] [phone]

## 2022-04-12 ENCOUNTER — Other Ambulatory Visit: Payer: BC Managed Care – PPO | Admitting: Pharmacist

## 2022-04-12 DIAGNOSIS — I10 Essential (primary) hypertension: Secondary | ICD-10-CM

## 2022-04-12 MED ORDER — VALSARTAN-HYDROCHLOROTHIAZIDE 160-12.5 MG PO TABS: 1.0000 | ORAL_TABLET | Freq: Every day | ORAL | 3 refills | Status: AC

## 2022-04-12 NOTE — Patient Instructions (Signed)
Grissel,   Start valsartan/hydrochlorothiazide 160/12.5 mg daily. Take this in the morning.   Please consider purchasing a good quality blood pressure machine, such as an Omron-brand upper arm cuff. This can be purchased from any pharmacy, including our [facility] outpatient pharmacies, for ~$28-$30. We recommend a blood pressure cuff that goes around your upper arm, as these are generally the most accurate.    Check your blood pressure once daily, and any time you have concerning symptoms like headache, chest pain, dizziness, shortness of breath, or vision changes.   Our goal is less than 130/80.  To appropriately check your blood pressure, make sure you do the following:  1) Avoid caffeine, exercise, or tobacco products for 30 minutes before checking. Empty your bladder. 2) Sit with your back supported in a flat-backed chair. Rest your arm on something flat (arm of the chair, table, etc). 3) Sit still with your feet flat on the floor, resting, for at least 5 minutes.  4) Check your blood pressure. Take 1-2 readings.  5) Write down these readings and bring with you to any provider appointments.  Bring your home blood pressure machine with you to a provider's office for accuracy comparison at least once a year.   Make sure you take your blood pressure medications before you come to any office visit, even if you were asked to fast for labs.  Call me with any questions or concerns. Take care!  Catie Eppie Gibson, PharmD, [facility] [phone]

## 2022-04-12 NOTE — Progress Notes (Signed)
Chief Complaint  Patient presents with   Hypertension   Medication Management    Wendy James is a 51 y.o. year old female who presented for a telephone visit.   They were referred to the pharmacist by their PCP for assistance in managing hypertension.    Subjective:  Care Team: Primary Care Provider: Ivonne Andrew, NP ; Next Scheduled Visit: 06/27/22   Medication Access/Adherence  Current Pharmacy:  [id number] [place], [place] - [address] AT [facility] [address] [facility] [address] [place] [address] Phone: [phone] Fax: [phone]  [facility] at [facility] [address] [place] [address] Phone: [phone] Fax: [phone]  [facility] at [facility] [address] [place] [address] Phone: [phone] Fax: [phone]   Patient reports affordability concerns with their medications: No  Patient reports access/transportation concerns to their pharmacy: No  Patient reports adherence concerns with their medications:  Yes  reports she is not taking amlodipine/olmesartan because she does not feel it is beneficial for her blood pressure   Hypertension:  Current medications: none Medications previously tried: amlodipine/olmesartan - denies benefit  Patient does not have a validated, automated, upper arm home BP cuff  Does report that she goes to [facility] and they have told her that she "holds fluid". Reports periodic lower extremity edema, denies orthopnea requiring more pillows at night. Denies SOB besides asthma  Reports little time to talk today so we did not discuss diet.   Previous question of gout, but uric acid was normal  Health Maintenance  Health Maintenance Due  Topic Date Due   MAMMOGRAM  11/04/2018   Zoster Vaccines- Shingrix (1 of 2) Never done   COVID-19 Vaccine (3 - Pfizer series) 12/24/2019   PAP  SMEAR-Modifier  09/30/2020     Objective: Lab Results  Component Value Date   HGBA1C 6.1 (H) 12/12/2021    Lab Results  Component Value Date   CREATININE 0.62 02/10/2022   BUN 11 02/10/2022   NA 139 02/10/2022   K 3.8 02/10/2022   CL 104 02/10/2022   CO2 29 02/10/2022    Lab Results  Component Value Date   CHOL 150 04/26/2020   HDL 53.60 04/26/2020   LDLCALC 84 04/26/2020   TRIG 63.0 04/26/2020   CHOLHDL 3 04/26/2020    Medications Reviewed Today     Reviewed by Alden Hipp, RPH-CPP (Pharmacist) on 04/12/22 at 0908  Med List Status: <None>   Medication Order Taking? Sig Documenting Provider Last Dose Status Informant  albuterol (PROVENTIL) (2.5 MG/3ML) 0.083% nebulizer solution [id number]  Take 3 mLs (2.5 mg total) by nebulization every 6 (six) hours as needed for wheezing or shortness of breath. Orion Crook I, NP  Active   albuterol (VENTOLIN HFA) 108 (90 Base) MCG/ACT inhaler [id number]  Inhale 1-2 puffs into the lungs every 4 (four) hours as needed for wheezing or shortness of breath. Ivonne Andrew, NP  Active   amLODipine-olmesartan (AZOR) 10-40 MG tablet [id number] No Take 1 tablet by mouth daily.  Patient not taking: Reported on 04/12/2022   Barbette Merino, NP Not Taking Active   budesonide-formoterol [facility]) 80-4.5 MCG/ACT inhaler [id number] No Inhale 2 puffs into the lungs 2 (two) times daily.  Patient not taking: Reported on 04/12/2022   Barbette Merino, NP Not Taking Active     Discontinued 04/20/20 1608 cholecalciferol (VITAMIN D3) 25 MCG (  1000 UNIT) tablet [id number] Yes Take 1,000 Units by mouth daily. [provider] Taking Active   hydrOXYzine (ATARAX) 10 MG tablet [id number] Yes Take 1 tablet (10 mg total) by mouth 3 (three) times daily as needed. Ivonne Andrew, NP Taking Active   LINZESS 72 MCG capsule [id number] Yes Take 1 capsule (72 mcg total) by mouth daily before breakfast. Barbette Merino, NP Taking Active   zolpidem (AMBIEN)  10 MG tablet [id number] Yes Take 1 tablet (10 mg total) by mouth at bedtime. Ivonne Andrew, NP Taking Active               Assessment/Plan:   Hypertension: - Currently uncontrolled - Reviewed long term cardiovascular and renal outcomes of uncontrolled blood pressure - Reviewed appropriate blood pressure monitoring technique and reviewed goal blood pressure. Recommended to check home blood pressure and heart rate periodically - Recommend to discontinue amlodipine/olmesartan. Recommend to start valsartan/HCTZ 160/12.5 mg daily. Discussed with PCP, she is in agreement. Encouraged to purchase an Omron upper arm BP machine. Patient verbalizes understanding. BMP ordered, lab visit scheduled.  - Counseled to take in the morning due to diuretic benefit - Consider updated ECHO moving forward due to patient reports of lower extremity edema and some shortness of breath.     Follow Up Plan: phone call in 3 weeks  Catie TClearance Coots, PharmD, [facility] [phone]

## 2022-04-12 NOTE — Progress Notes (Signed)
Spoke with patient today, see encounter

## 2022-04-18 ENCOUNTER — Ambulatory Visit: Payer: BC Managed Care – PPO | Admitting: Nurse Practitioner

## 2022-04-30 ENCOUNTER — Other Ambulatory Visit: Payer: Self-pay

## 2022-05-03 ENCOUNTER — Telehealth: Payer: Self-pay | Admitting: Pharmacist

## 2022-05-03 ENCOUNTER — Ambulatory Visit: Payer: BC Managed Care – PPO | Admitting: Nurse Practitioner

## 2022-05-03 ENCOUNTER — Other Ambulatory Visit: Payer: BC Managed Care – PPO | Admitting: Pharmacist

## 2022-05-03 NOTE — Progress Notes (Signed)
Attempted to contact patient for scheduled appointment for medication management. Left HIPAA compliant message for patient to return my call at their convenience.   Catie T. Anael Rosch, PharmD, BCACP [facility] [phone]  

## 2022-05-11 ENCOUNTER — Other Ambulatory Visit: Payer: Self-pay | Admitting: Nurse Practitioner

## 2022-05-13 ENCOUNTER — Other Ambulatory Visit: Payer: Self-pay | Admitting: Nurse Practitioner

## 2022-05-14 NOTE — Telephone Encounter (Signed)
Appt 06/04/2022 @2pm 

## 2022-05-21 ENCOUNTER — Other Ambulatory Visit: Payer: Self-pay | Admitting: Nurse Practitioner

## 2022-05-21 DIAGNOSIS — G4700 Insomnia, unspecified: Secondary | ICD-10-CM

## 2022-05-21 DIAGNOSIS — J4521 Mild intermittent asthma with (acute) exacerbation: Secondary | ICD-10-CM

## 2022-05-21 NOTE — Telephone Encounter (Signed)
[facility] is requesting to fill pt albuterol inhaler. Please advise KH 

## 2022-05-28 ENCOUNTER — Other Ambulatory Visit: Payer: Self-pay | Admitting: Family Medicine

## 2022-05-28 ENCOUNTER — Telehealth: Payer: Self-pay

## 2022-05-28 DIAGNOSIS — G4700 Insomnia, unspecified: Secondary | ICD-10-CM

## 2022-05-28 MED ORDER — ZOLPIDEM TARTRATE 10 MG PO TABS: 10.0000 mg | ORAL_TABLET | Freq: Every day | ORAL | 0 refills | Status: DC

## 2022-05-28 NOTE — Telephone Encounter (Signed)
Zolpidem to be refilled

## 2022-05-28 NOTE — Progress Notes (Signed)
Reviewed PDMP substance reporting system prior to prescribing opiate medications. No inconsistencies noted.  Meds ordered this encounter  Medications   zolpidem (AMBIEN) 10 MG tablet    Sig: Take 1 tablet (10 mg total) by mouth at bedtime.    Dispense:  90 tablet    Refill:  0    Order Specific Question:   Supervising Provider    Answer:   Tresa Garter [[id number]]   Donia Pounds  APRN, MSN, FNP-C [facility] [place] [address] [place], [place] [address] [phone]

## 2022-06-04 ENCOUNTER — Other Ambulatory Visit: Payer: BC Managed Care – PPO | Admitting: Pharmacist

## 2022-06-04 ENCOUNTER — Telehealth: Payer: Self-pay | Admitting: Pharmacist

## 2022-06-04 NOTE — Patient Instructions (Addendum)
Wendy James,   Keep up the great work! Continue valsartan/HCTZ. We'll see how your blood pressure is doing when you see Tonya in office in 3 weeks.   Thanks!  Catie Hedwig Morton, PharmD, [facility] [phone]

## 2022-06-04 NOTE — Progress Notes (Signed)
06/04/2022 Name: Wendy James MRN: [id number] DOB: 1970-09-19  Chief Complaint  Patient presents with   Medication Management   Hypertension    Wendy James is a 51 y.o. year old female who presented for a telephone visit.   They were referred to the pharmacist by their PCP for assistance in managing hypertension.   Subjective:  Care Team: Primary Care Provider: Fenton Foy, NP ; Next Scheduled Visit: 06/25/22  Medication Access/Adherence  Current Pharmacy:  [facility] [place], [place] - [place] [address] AT [address] [place] [address] Phone: [phone] Fax: [place] [address], [place] [address] Phone: [phone] Fax: [place] [address] Phone: [phone] Fax: [phone]   Patient reports affordability concerns with their medications: No  Patient reports access/transportation concerns to their pharmacy: No  Patient reports adherence concerns with their medications:  No     Hypertension:  Current medications: valsartan/HCTZ 160/12.5 mg daily  Patient does not have a validated, automated, upper arm home BP cuff; has not been able to afford. However, reports improvement in symptoms of hypertension - resolved headache, resolved swelling in feet/ankles.   Patient denies hypotensive s/sx including dizziness, lightheadedness.  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 Reports when she previously took hydroxyzine it calmed her down, but did not make her sleepy.     Health Maintenance  Health Maintenance Due  Topic Date Due   MAMMOGRAM  11/04/2018   Zoster Vaccines- Shingrix (1 of 2) Never done   COVID-19 Vaccine (3 - Pfizer series) 12/24/2019   PAP SMEAR-Modifier  09/30/2020     Objective: Lab Results  Component Value Date   HGBA1C 6.1 (H) 12/12/2021    Lab Results  Component Value Date   CREATININE 0.62 02/10/2022   BUN 11 02/10/2022   NA 139 02/10/2022   K 3.8 02/10/2022   CL 104 02/10/2022   CO2 29 02/10/2022    Lab Results  Component Value Date   CHOL 150 04/26/2020   HDL 53.60 04/26/2020   LDLCALC 84 04/26/2020   TRIG 63.0 04/26/2020   CHOLHDL 3 04/26/2020    Medications Reviewed Today     Reviewed by Osker Mason, RPH-CPP (Pharmacist) on 04/12/22 at [place] List Status: <None>   Medication Order Taking? Sig Documenting Provider Last Dose Status Informant  albuterol (PROVENTIL) (2.5 MG/3ML) 0.083% nebulizer solution [id number]  Take 3 mLs (2.5 mg total) by nebulization every 6 (six) hours as needed for wheezing or shortness of breath. Bo Merino I, NP  Active   albuterol (VENTOLIN HFA) 108 (90 Base) MCG/ACT inhaler [id number]  Inhale 1-2 puffs into the lungs every 4 (four) hours as needed for wheezing or shortness of breath. Fenton Foy, NP  Active   amLODipine-olmesartan (AZOR) 10-40 MG tablet [id number] No Take 1 tablet by mouth daily.  Patient not taking: Reported on 04/12/2022   Edison Pace,  Shana Chute, NP Not Taking Active   budesonide-formoterol (SYMBICORT) 80-4.5 MCG/ACT inhaler [id number] No Inhale 2 puffs into the lungs 2 (two) times daily.  Patient not taking: Reported on 04/12/2022   Barbette Merino, NP Not Taking Active     Discontinued 04/20/20 1608 cholecalciferol (VITAMIN D3) 25 MCG (1000 UNIT) tablet [id number]  Yes Take 1,000 Units by mouth daily. [provider] Taking Active   hydrOXYzine (ATARAX) 10 MG tablet [id number] Yes Take 1 tablet (10 mg total) by mouth 3 (three) times daily as needed. Ivonne Andrew, NP Taking Active   LINZESS 72 MCG capsule [id number] Yes Take 1 capsule (72 mcg total) by mouth daily before breakfast. Barbette Merino, NP Taking Active   zolpidem (AMBIEN) 10 MG tablet [id number] Yes Take 1 tablet (10 mg total) by mouth at bedtime. Ivonne Andrew, NP Taking Active               Assessment/Plan:   Hypertension: - Currently unknown but assumed improved based on symptoms.  - Recommend to continue current regimen. Follow up with PCP in 3 weeks as scheduled   Asthma: - Currently controlled.  - Recommend to continue current regimen at this time    Depression/Anxiety: - Currently uncontrolled; passed medication refill request along to clinic staff - Recommend to consider trial of dose reduction to 5 mg of zolpidem.     Follow Up Plan: phone call follow up in 6 weeks  Catie Eppie Gibson, PharmD, [facility] [phone]

## 2022-06-04 NOTE — Progress Notes (Signed)
Patient requests refill on hydroxyzine. Reports she has work-associated stress, but this medication/dose did not make her sedated when she previously took it.

## 2022-06-25 ENCOUNTER — Ambulatory Visit: Payer: BC Managed Care – PPO | Admitting: Nurse Practitioner

## 2022-06-27 ENCOUNTER — Ambulatory Visit: Payer: BC Managed Care – PPO | Admitting: Nurse Practitioner

## 2022-07-09 ENCOUNTER — Encounter (HOSPITAL_BASED_OUTPATIENT_CLINIC_OR_DEPARTMENT_OTHER): Payer: Self-pay | Admitting: Emergency Medicine

## 2022-07-09 ENCOUNTER — Other Ambulatory Visit: Payer: BC Managed Care – PPO | Admitting: Pharmacist

## 2022-07-09 ENCOUNTER — Emergency Department (HOSPITAL_BASED_OUTPATIENT_CLINIC_OR_DEPARTMENT_OTHER): Payer: BC Managed Care – PPO

## 2022-07-09 ENCOUNTER — Other Ambulatory Visit: Payer: Self-pay

## 2022-07-09 DIAGNOSIS — R1010 Upper abdominal pain, unspecified: Secondary | ICD-10-CM | POA: Diagnosis present

## 2022-07-09 DIAGNOSIS — K449 Diaphragmatic hernia without obstruction or gangrene: Secondary | ICD-10-CM | POA: Insufficient documentation

## 2022-07-09 LAB — COMPREHENSIVE METABOLIC PANEL
ALT: 26 U/L (ref 0–44)
AST: 22 U/L (ref 15–41)
Albumin: 4.4 g/dL (ref 3.5–5.0)
Alkaline Phosphatase: 71 U/L (ref 38–126)
Anion gap: 8 (ref 5–15)
BUN: 18 mg/dL (ref 6–20)
CO2: 29 mmol/L (ref 22–32)
Calcium: 9.3 mg/dL (ref 8.9–10.3)
Chloride: 103 mmol/L (ref 98–111)
Creatinine, Ser: 0.67 mg/dL (ref 0.44–1.00)
GFR, Estimated: >60 mL/min (ref >60)
Glucose, Bld: 101 mg/dL — ABNORMAL HIGH (ref 70–99)
Potassium: 3.9 mmol/L (ref 3.5–5.1)
Sodium: 140 mmol/L (ref 135–145)
Total Bilirubin: 0.6 mg/dL (ref 0.3–1.2)
Total Protein: 7.5 g/dL (ref 6.5–8.1)

## 2022-07-09 LAB — URINALYSIS, ROUTINE W REFLEX MICROSCOPIC
Bilirubin Urine: NEGATIVE
Glucose, UA: NEGATIVE mg/dL
Hgb urine dipstick: NEGATIVE
Ketones, ur: NEGATIVE mg/dL
Leukocytes,Ua: NEGATIVE
Nitrite: NEGATIVE
Protein, ur: NEGATIVE mg/dL
Specific Gravity, Urine: 1.012 (ref 1.005–1.030)
pH: 7 (ref 5.0–8.0)

## 2022-07-09 LAB — CBC WITH DIFFERENTIAL/PLATELET
Abs Immature Granulocytes: 0.01 10*3/uL (ref 0.00–0.07)
Basophils Absolute: 0.1 10*3/uL (ref 0.0–0.1)
Basophils Relative: 1 %
Eosinophils Absolute: 0.3 10*3/uL (ref 0.0–0.5)
Eosinophils Relative: 4 %
HCT: 41.4 % (ref 36.0–46.0)
Hemoglobin: 13.7 g/dL (ref 12.0–15.0)
Immature Granulocytes: 0 %
Lymphocytes Relative: 47 %
Lymphs Abs: 3.1 10*3/uL (ref 0.7–4.0)
MCH: 30 pg (ref 26.0–34.0)
MCHC: 33.1 g/dL (ref 30.0–36.0)
MCV: 90.8 fL (ref 80.0–100.0)
Monocytes Absolute: 0.5 10*3/uL (ref 0.1–1.0)
Monocytes Relative: 8 %
Neutro Abs: 2.6 10*3/uL (ref 1.7–7.7)
Neutrophils Relative %: 40 %
Platelets: 189 10*3/uL (ref 150–400)
RBC: 4.56 MIL/uL (ref 3.87–5.11)
RDW: 12.9 % (ref 11.5–15.5)
WBC: 6.6 10*3/uL (ref 4.0–10.5)
nRBC: 0 % (ref 0.0–0.2)

## 2022-07-09 LAB — LIPASE, BLOOD: Lipase: 99 U/L — ABNORMAL HIGH (ref 11–51)

## 2022-07-09 NOTE — ED Triage Notes (Signed)
Patient c/o abdominal pain and swelling to her abdomen since this morning.

## 2022-07-09 NOTE — Progress Notes (Unsigned)
07/09/2022 Name: Wendy James MRN: [id number] DOB: Jun 04, 1971  Chief Complaint  Patient presents with   Medication Management   Diabetes   Hypertension    Wendy James is a 51 y.o. year old female who presented for a telephone visit.   They were referred to the pharmacist by their PCP for assistance in managing hypertension.    Subjective:  Care Team: Primary Care Provider: Ivonne Andrew, NP ; Next Scheduled Visit: 07/12/22  Medication Access/Adherence  Current Pharmacy:  [id number] [place], [place] - [address] AT [facility] [address] [facility] [place] [address] Phone: [phone] Fax: [phone]  [facility] - [facility] [address], [address] [place] [address] Phone: [phone] Fax: [phone]  [facility] [place] [facility] [address] [place] [address] Phone: [phone] Fax: [phone]   Patient reports affordability concerns with their medications: Yes  Patient reports access/transportation concerns to their pharmacy: No  Patient reports adherence concerns with their medications:  Yes  reports she is unable to afford Symbicort, so has been taking albuterol HFA several times daily   Hypertension:  Current medications: valsartan/HCTZ 160/12.5 mg daily   Patient does not have a validated, automated, upper arm home BP cuff; no insurance coverage and cannot afford  Patient denies hypotensive s/sx including dizziness, lightheadedness.    Asthma:  Current medications: albuterol HFA PRN - reports she is unable to afford Symbicort  Health Maintenance  Health Maintenance Due  Topic Date Due   MAMMOGRAM  11/04/2018   Zoster Vaccines- Shingrix (1 of 2) Never done   PAP SMEAR-Modifier  09/30/2020   COVID-19 Vaccine (3 - 2023-24 season) 03/30/2022     Objective: Lab Results  Component Value Date   HGBA1C 6.1 (H)  12/12/2021    Lab Results  Component Value Date   CREATININE 0.62 02/10/2022   BUN 11 02/10/2022   NA 139 02/10/2022   K 3.8 02/10/2022   CL 104 02/10/2022   CO2 29 02/10/2022    Lab Results  Component Value Date   CHOL 150 04/26/2020   HDL 53.60 04/26/2020   LDLCALC 84 04/26/2020   TRIG 63.0 04/26/2020   CHOLHDL 3 04/26/2020    Medications Reviewed Today     Reviewed by Alden Hipp, RPH-CPP (Pharmacist) on 07/09/22 at 1332  Med List Status: <None>   Medication Order Taking? Sig Documenting Provider Last Dose Status Informant  albuterol (PROVENTIL) (2.5 MG/3ML) 0.083% nebulizer solution [id number]  Take 3 mLs (2.5 mg total) by nebulization every 6 (six) hours as needed for wheezing or shortness of breath.  Patient not taking: Reported on 06/04/2022   Orion Crook I, NP  Active   albuterol (VENTOLIN HFA) 108 (90 Base) MCG/ACT inhaler [id number]  INHALE 1 TO 2 PUFFS INTO THE LUNGS EVERY 6 HOURS AS NEEDED FOR WHEEZING OR SHORTNESS OF BREATH  Patient not taking: Reported on 06/04/2022   Ivonne Andrew, NP  Active   budesonide-formoterol [facility]) 80-4.5 MCG/ACT inhaler [id number] No Inhale 2 puffs into the lungs 2 (two) times daily.  Patient not taking: Reported on 06/04/2022   Barbette Merino, NP Not Taking Active     Discontinued 04/20/20 1608 cholecalciferol (VITAMIN D3) 25 MCG (1000 UNIT) tablet [id number]  Take 1,000 Units by mouth daily. [provider]  Active   hydrOXYzine (ATARAX) 10 MG tablet [id number]  Take 1 tablet (10 mg total) by mouth  3 (three) times daily as needed.  Patient not taking: Reported on 06/04/2022   Ivonne Andrew, NP  Active   LINZESS 72 MCG capsule [id number] Yes Take 1 capsule (72 mcg total) by mouth daily before breakfast. Barbette Merino, NP Taking Active   valsartan-hydrochlorothiazide (DIOVAN-HCT) 160-12.5 MG tablet [id number] Yes Take 1 tablet by mouth daily. Ivonne Andrew, NP Taking Active   zolpidem (AMBIEN) 10 MG tablet  [id number]  Take 1 tablet (10 mg total) by mouth at bedtime. Massie Maroon, FNP  Active               Assessment/Plan:   Hypertension: - Currently uncontrolled but anticipated improvement; follow up with PCP appointment later this week for BP check  Asthma: - Currently uncontrolled due to medication access concerns - Contacted [facility] to see if they were running brand or generic Symbicort. Appears insurance denied generic and prefers brand. Will discuss options with PCP  Follow Up Plan: follow BP at PCP office  Catie Eppie Gibson, PharmD, [facility] [phone]

## 2022-07-10 ENCOUNTER — Emergency Department (HOSPITAL_BASED_OUTPATIENT_CLINIC_OR_DEPARTMENT_OTHER)
Admission: EM | Admit: 2022-07-10 | Discharge: 2022-07-10 | Disposition: A | Discharge status: Home / Self Care | Payer: BC Managed Care – PPO | Attending: Emergency Medicine | Admitting: Emergency Medicine

## 2022-07-10 DIAGNOSIS — K859 Acute pancreatitis without necrosis or infection, unspecified: Secondary | ICD-10-CM

## 2022-07-10 DIAGNOSIS — K449 Diaphragmatic hernia without obstruction or gangrene: Secondary | ICD-10-CM

## 2022-07-10 LAB — PREGNANCY, URINE: Preg Test, Ur: NEGATIVE

## 2022-07-10 MED ORDER — ONDANSETRON 8 MG PO TBDP: ORAL_TABLET | ORAL | 0 refills | Status: DC

## 2022-07-10 MED ORDER — ONDANSETRON 4 MG PO TBDP
8.0000 mg | ORAL_TABLET | Freq: Once | ORAL | Status: AC
  Administered 2022-07-10: 8 mg via ORAL
  Filled 2022-07-10: qty 2

## 2022-07-10 MED ORDER — TRAMADOL HCL 50 MG PO TABS: 50.0000 mg | ORAL_TABLET | Freq: Four times a day (QID) | ORAL | 0 refills | Status: AC | PRN

## 2022-07-10 MED ORDER — FENTANYL CITRATE PF 50 MCG/ML IJ SOSY
50.0000 ug | PREFILLED_SYRINGE | Freq: Once | INTRAMUSCULAR | Status: AC
  Administered 2022-07-10: 50 ug via INTRAMUSCULAR
  Filled 2022-07-10: qty 1

## 2022-07-10 MED ORDER — ALUM & MAG HYDROXIDE-SIMETH 200-200-20 MG/5ML PO SUSP
30.0000 mL | Freq: Once | ORAL | Status: AC
  Administered 2022-07-10: 30 mL via ORAL
  Filled 2022-07-10: qty 30

## 2022-07-10 MED ORDER — OMEPRAZOLE 20 MG PO CPDR: 20.0000 mg | DELAYED_RELEASE_CAPSULE | Freq: Every day | ORAL | 0 refills | Status: AC

## 2022-07-10 MED ORDER — ONDANSETRON HCL 4 MG/2ML IJ SOLN: 4.0000 mg | Freq: Once | INTRAMUSCULAR | Status: DC

## 2022-07-10 NOTE — ED Provider Notes (Signed)
[facility]   CSN: [id number] Arrival date & time: 07/09/22  2042     History  Chief Complaint  Patient presents with   Abdominal Pain    Wendy MeringLynette James is a 51 y.o. female.  The history is provided by the patient.  Abdominal Pain Pain location:  Periumbilical, LUQ and RUQ Pain radiates to:  Does not radiate Pain severity:  Severe Onset quality:  Gradual Duration:  1 day (days) Timing:  Constant Progression:  Unchanged Chronicity:  New Context: not trauma   Relieved by:  Nothing Worsened by:  Nothing Ineffective treatments:  None tried Associated symptoms: nausea   Associated symptoms: no chills, no diarrhea, no fever, no flatus and no vomiting   Risk factors: not pregnant        Home Medications Prior to Admission medications   Medication Sig Start Date End Date Taking? Authorizing Provider  albuterol (PROVENTIL) (2.5 MG/3ML) 0.083% nebulizer solution Take 3 mLs (2.5 mg total) by nebulization every 6 (six) hours as needed for wheezing or shortness of breath. Patient not taking: Reported on 06/04/2022 11/01/21   Orion CrookPassmore, Tewana I, NP  albuterol (VENTOLIN HFA) 108 (90 Base) MCG/ACT inhaler INHALE 1 TO 2 PUFFS INTO THE LUNGS EVERY 6 HOURS AS NEEDED FOR WHEEZING OR SHORTNESS OF BREATH 05/21/22   Ivonne AndrewNichols, Tonya S, NP  budesonide-formoterol (SYMBICORT) 80-4.5 MCG/ACT inhaler Inhale 2 puffs into the lungs 2 (two) times daily. Patient not taking: Reported on 07/09/2022 09/21/21   Barbette MerinoKing, Crystal M, NP  cetirizine (ZYRTEC) 10 MG tablet Take 10 mg by mouth daily. 07/09/22   [provider]  cholecalciferol (VITAMIN D3) 25 MCG (1000 UNIT) tablet Take 1,000 Units by mouth daily.    [provider]  hydrOXYzine (ATARAX) 10 MG tablet Take 1 tablet (10 mg total) by mouth 3 (three) times daily as needed. Patient not taking: Reported on 06/04/2022 03/21/22   Ivonne AndrewNichols, Tonya S, NP  LINZESS 72 MCG capsule Take 1 capsule (72 mcg  total) by mouth daily before breakfast. 09/07/21   Barbette MerinoKing, Crystal M, NP  valsartan-hydrochlorothiazide (DIOVAN-HCT) 160-12.5 MG tablet Take 1 tablet by mouth daily. 04/12/22   Ivonne AndrewNichols, Tonya S, NP  zolpidem (AMBIEN) 10 MG tablet Take 1 tablet (10 mg total) by mouth at bedtime. 05/28/22   Massie MaroonHollis, Lachina M, FNP      Allergies    Mobic [meloxicam] and Shrimp [shellfish allergy]    Review of Systems   Review of Systems  Constitutional:  Negative for chills and fever.  HENT:  Negative for facial swelling.   Eyes:  Negative for redness.  Respiratory:  Negative for wheezing and stridor.   Cardiovascular:  Negative for palpitations.  Gastrointestinal:  Positive for abdominal pain and nausea. Negative for diarrhea, flatus and vomiting.  All other systems reviewed and are negative.   Physical Exam Updated Vital Signs BP (!) 176/114   Pulse 65   Temp 98.1 F (36.7 C)   Resp 17   Ht 5\' 2"  (1.575 m)   Wt 86.2 kg   LMP 09/04/2018   SpO2 98%   BMI 34.75 kg/m  Physical Exam Vitals and nursing note reviewed.  Constitutional:      General: She is not in acute distress.    Appearance: She is well-developed.  HENT:     Head: Normocephalic and atraumatic.     Nose: Nose normal.  Eyes:     Pupils: Pupils are equal, round, and reactive to light.  Cardiovascular:  Rate and Rhythm: Normal rate and regular rhythm.     Pulses: Normal pulses.     Heart sounds: Normal heart sounds.  Pulmonary:     Effort: Pulmonary effort is normal. No respiratory distress.     Breath sounds: Normal breath sounds.  Abdominal:     General: Bowel sounds are normal. There is no distension.     Palpations: Abdomen is soft.     Tenderness: There is no abdominal tenderness. There is no guarding or rebound. Negative signs include Murphy's sign, Rovsing's sign and McBurney's sign.  Genitourinary:    Vagina: No vaginal discharge.  Musculoskeletal:        General: Normal range of motion.     Cervical back: Neck  supple.  Skin:    General: Skin is warm and dry.     Capillary Refill: Capillary refill takes less than 2 seconds.     Findings: No erythema or rash.  Neurological:     General: No focal deficit present.     Mental Status: She is oriented to person, place, and time.     Deep Tendon Reflexes: Reflexes normal.  Psychiatric:        Mood and Affect: Mood normal.        Behavior: Behavior normal.     ED Results / Procedures / Treatments   Labs (all labs ordered are listed, but only abnormal results are displayed) Results for orders placed or performed during the hospital encounter of 07/10/22  CBC with Differential  Result Value Ref Range   WBC 6.6 4.0 - 10.5 K/uL   RBC 4.56 3.87 - 5.11 MIL/uL   Hemoglobin 13.7 12.0 - 15.0 g/dL   HCT 06.2 37.6 - 28.3 %   MCV 90.8 80.0 - 100.0 fL   MCH 30.0 26.0 - 34.0 pg   MCHC 33.1 30.0 - 36.0 g/dL   RDW 15.1 76.1 - 60.7 %   Platelets 189 150 - 400 K/uL   nRBC 0.0 0.0 - 0.2 %   Neutrophils Relative % 40 %   Neutro Abs 2.6 1.7 - 7.7 K/uL   Lymphocytes Relative 47 %   Lymphs Abs 3.1 0.7 - 4.0 K/uL   Monocytes Relative 8 %   Monocytes Absolute 0.5 0.1 - 1.0 K/uL   Eosinophils Relative 4 %   Eosinophils Absolute 0.3 0.0 - 0.5 K/uL   Basophils Relative 1 %   Basophils Absolute 0.1 0.0 - 0.1 K/uL   Immature Granulocytes 0 %   Abs Immature Granulocytes 0.01 0.00 - 0.07 K/uL  Comprehensive metabolic panel  Result Value Ref Range   Sodium 140 135 - 145 mmol/L   Potassium 3.9 3.5 - 5.1 mmol/L   Chloride 103 98 - 111 mmol/L   CO2 29 22 - 32 mmol/L   Glucose, Bld 101 (H) 70 - 99 mg/dL   BUN 18 6 - 20 mg/dL   Creatinine, Ser 3.71 0.44 - 1.00 mg/dL   Calcium 9.3 8.9 - 06.2 mg/dL   Total Protein 7.5 6.5 - 8.1 g/dL   Albumin 4.4 3.5 - 5.0 g/dL   AST 22 15 - 41 U/L   ALT 26 0 - 44 U/L   Alkaline Phosphatase 71 38 - 126 U/L   Total Bilirubin 0.6 0.3 - 1.2 mg/dL   GFR, Estimated >69 >48 mL/min   Anion gap 8 5 - 15  Lipase, blood  Result Value  Ref Range   Lipase 99 (H) 11 - 51 U/L  Urinalysis, Routine w  reflex microscopic Urine, Clean Catch  Result Value Ref Range   Color, Urine COLORLESS (A) YELLOW   APPearance CLEAR CLEAR   Specific Gravity, Urine 1.012 1.005 - 1.030   pH 7.0 5.0 - 8.0   Glucose, UA NEGATIVE NEGATIVE mg/dL   Hgb urine dipstick NEGATIVE NEGATIVE   Bilirubin Urine NEGATIVE NEGATIVE   Ketones, ur NEGATIVE NEGATIVE mg/dL   Protein, ur NEGATIVE NEGATIVE mg/dL   Nitrite NEGATIVE NEGATIVE   Leukocytes,Ua NEGATIVE NEGATIVE  Pregnancy, urine  Result Value Ref Range   Preg Test, Ur NEGATIVE NEGATIVE   CT Renal Stone Study  Result Date: 07/09/2022 CLINICAL DATA:  Abdominal/flank pain, stone suspected EXAM: CT ABDOMEN AND PELVIS WITHOUT CONTRAST TECHNIQUE: Multidetector CT imaging of the abdomen and pelvis was performed following the standard protocol without IV contrast. RADIATION DOSE REDUCTION: This exam was performed according to the departmental dose-optimization program which includes automated exposure control, adjustment of the mA and/or kV according to patient size and/or use of iterative reconstruction technique. COMPARISON:  CT abdomen pelvis 12/17/2011 FINDINGS: Lower chest: No acute abnormality.  Trace hiatal hernia. Hepatobiliary: Slightly increased in size chronic subcentimeter hypodensity along the right hepatic lobe dome too small to characterize (5:55). Status post cholecystectomy. No biliary dilatation. Pancreas: No focal lesion. Normal pancreatic contour. No surrounding inflammatory changes. No main pancreatic ductal dilatation. Spleen: Normal in size without focal abnormality. Adrenals/Urinary Tract: No adrenal nodule bilaterally. No nephrolithiasis and no hydronephrosis. No definite contour-deforming renal mass. No ureterolithiasis or hydroureter. The urinary bladder is unremarkable. Stomach/Bowel: Stomach is within normal limits. No evidence of bowel wall thickening or dilatation. Appendix appears  normal. Vascular/Lymphatic: No abdominal aorta or iliac aneurysm. No abdominal, pelvic, or inguinal lymphadenopathy. Reproductive: Uterus and bilateral adnexa are unremarkable. Other: No intraperitoneal free fluid. No intraperitoneal free gas. No organized fluid collection. Musculoskeletal: Tiny fat containing umbilical hernia. No suspicious lytic or blastic osseous lesions. No acute displaced fracture. IMPRESSION: 1. No acute intra-abdominal intrapelvic abnormality with limited evaluation on this noncontrast study. 2. Tiny hiatal hernia. Electronically Signed   By: Tish Frederickson M.D.   On: 07/09/2022 23:49    Radiology CT Renal Stone Study  Result Date: 07/09/2022 CLINICAL DATA:  Abdominal/flank pain, stone suspected EXAM: CT ABDOMEN AND PELVIS WITHOUT CONTRAST TECHNIQUE: Multidetector CT imaging of the abdomen and pelvis was performed following the standard protocol without IV contrast. RADIATION DOSE REDUCTION: This exam was performed according to the departmental dose-optimization program which includes automated exposure control, adjustment of the mA and/or kV according to patient size and/or use of iterative reconstruction technique. COMPARISON:  CT abdomen pelvis 12/17/2011 FINDINGS: Lower chest: No acute abnormality.  Trace hiatal hernia. Hepatobiliary: Slightly increased in size chronic subcentimeter hypodensity along the right hepatic lobe dome too small to characterize (5:55). Status post cholecystectomy. No biliary dilatation. Pancreas: No focal lesion. Normal pancreatic contour. No surrounding inflammatory changes. No main pancreatic ductal dilatation. Spleen: Normal in size without focal abnormality. Adrenals/Urinary Tract: No adrenal nodule bilaterally. No nephrolithiasis and no hydronephrosis. No definite contour-deforming renal mass. No ureterolithiasis or hydroureter. The urinary bladder is unremarkable. Stomach/Bowel: Stomach is within normal limits. No evidence of bowel wall thickening or  dilatation. Appendix appears normal. Vascular/Lymphatic: No abdominal aorta or iliac aneurysm. No abdominal, pelvic, or inguinal lymphadenopathy. Reproductive: Uterus and bilateral adnexa are unremarkable. Other: No intraperitoneal free fluid. No intraperitoneal free gas. No organized fluid collection. Musculoskeletal: Tiny fat containing umbilical hernia. No suspicious lytic or blastic osseous lesions. No acute displaced fracture. IMPRESSION: 1. No  acute intra-abdominal intrapelvic abnormality with limited evaluation on this noncontrast study. 2. Tiny hiatal hernia. Electronically Signed   By: Tish Frederickson M.D.   On: 07/09/2022 23:49    Procedures Procedures    Medications Ordered in ED Medications  alum & mag hydroxide-simeth (MAALOX/MYLANTA) 200-200-20 MG/5ML suspension 30 mL (30 mLs Oral Given 07/10/22 0201)  fentaNYL (SUBLIMAZE) injection 50 mcg (50 mcg Intramuscular Given 07/10/22 0202)  ondansetron (ZOFRAN-ODT) disintegrating tablet 8 mg (8 mg Oral Given 07/10/22 0202)    ED Course/ Medical Decision Making/ A&P                           Medical Decision Making Patient with upper abdominal pain and nausea   Amount and/or Complexity of Data Reviewed External Data Reviewed: notes.    Details: Previous notes reviewed  Labs: ordered.    Details: All labs reviewed: negative pregnancy test and negative urine sample.  White count 6.6, normal hemoglobin 14.6, normal platelet count.  Normal sodium 140, normal potassium 3.9, normal creatinine.  Normal LFTs.  Lipase elevated 99.   Radiology: ordered and independent interpretation performed.    Details: Hiatal hernia on CT by me   Risk OTC drugs. Prescription drug management. Risk Details: Well appearing with normal exam, and imaging.  Lipase is slightly elevated.  Will start pain and nausea medication.  Have advised no drinking of alcohol or driving a car or operating machinery while taking narcotic pain medication.  Strict return  precautions given.      Final Clinical Impression(s) / ED Diagnoses Final diagnoses:  None   Return for intractable cough, coughing up blood, fevers > 100.4 unrelieved by medication, shortness of breath, intractable vomiting, chest pain, shortness of breath, weakness, numbness, changes in speech, facial asymmetry, abdominal pain, passing out, Inability to tolerate liquids or food, cough, altered mental status or any concerns. No signs of systemic illness or infection. The patient is nontoxic-appearing on exam and vital signs are within normal limits.  I have reviewed the triage vital signs and the nursing notes. Pertinent labs & imaging results that were available during my care of the patient were reviewed by me and considered in my medical decision making (see chart for details). After history, exam, and medical workup I feel the patient has been appropriately medically screened and is safe for discharge home. Pertinent diagnoses were discussed with the patient. Patient was given return precautions.    Rx / DC Orders ED Discharge Orders     None         Keir Foland, MD 07/10/22 [phone]

## 2022-07-11 ENCOUNTER — Telehealth: Payer: Self-pay

## 2022-07-11 NOTE — Telephone Encounter (Signed)
Transition Care Management Follow-up Telephone Call Date of discharge and from where: 07/10/22 How have you been since you were released from the hospital? Per pt she feels better Any questions or concerns? No  Items Reviewed: Did the pt receive and understand the discharge instructions provided? Yes  Medications obtained and verified? Yes  Other? No  Any new allergies since your discharge? No  Dietary orders reviewed? No Do you have support at home? Yes      Follow up appointments reviewed:  PCP Hospital f/u appt confirmed? Yes  Scheduled to see Angus Seller on 07/20/22 @ 11:20 am . [facility] f/u appt confirmed? No   Are transportation arrangements needed? No  If their condition worsens, is the pt aware to call PCP or go to the Emergency Dept.? Yes Was the patient provided with contact information for the PCP's office or ED? Yes Was to pt encouraged to call back with questions or concerns? Yes    Renelda Loma RMA

## 2022-07-12 ENCOUNTER — Telehealth (INDEPENDENT_AMBULATORY_CARE_PROVIDER_SITE_OTHER): Payer: BC Managed Care – PPO | Admitting: Nurse Practitioner

## 2022-07-12 ENCOUNTER — Telehealth: Payer: Self-pay | Admitting: *Deleted

## 2022-07-12 DIAGNOSIS — K769 Liver disease, unspecified: Secondary | ICD-10-CM | POA: Diagnosis not present

## 2022-07-12 DIAGNOSIS — F419 Anxiety disorder, unspecified: Secondary | ICD-10-CM | POA: Diagnosis not present

## 2022-07-12 DIAGNOSIS — K449 Diaphragmatic hernia without obstruction or gangrene: Secondary | ICD-10-CM

## 2022-07-12 DIAGNOSIS — J4520 Mild intermittent asthma, uncomplicated: Secondary | ICD-10-CM

## 2022-07-12 NOTE — Progress Notes (Signed)
Virtual Visit via Video Note  I connected with Wendy James on 07/12/22 at  1:40 PM EST by a video enabled telemedicine application and verified that I am speaking with the correct person using two identifiers.  Location: Patient: home Provider: office   I discussed the limitations of evaluation and management by telemedicine and the availability of in person appointments. The patient expressed understanding and agreed to proceed.  History of Present Illness:  Patient presents today for a telephone visit.  Patient is requesting FMLA paperwork to be completed for intermittent leave due to asthma flares and anxiety.  She was also recently seen in the ED for pancreatitis CT scan revealed small lesion to her liver and hiatal hernia.  Patient states that she has not followed up with GI.  We will place referral today. Denies f/c/s, n/v/d, hemoptysis, PND, leg swelling Denies chest pain or edema     Observations/Objective:     07/10/2022    2:00 AM 07/10/2022    1:49 AM 07/09/2022    9:25 PM  Vitals with BMI  Systolic 176 193 416  Diastolic 114 113 98  Pulse 65 60 65      Assessment and Plan:  1. Liver lesion  - Ambulatory referral to Gastroenterology  2. Hiatal hernia  - Ambulatory referral to Gastroenterology   3. Mild intermittent asthma without complication FMLA completed  4. Anxiety FMLA completed  Follow up:  Follow up as scheduled     I discussed the assessment and treatment plan with the patient. The patient was provided an opportunity to ask questions and all were answered. The patient agreed with the plan and demonstrated an understanding of the instructions.   The patient was advised to call back or seek an in-person evaluation if the symptoms worsen or if the condition fails to improve as anticipated.  I provided 23 minutes of non-face-to-face time during this encounter.   Ivonne Andrew, NP

## 2022-07-12 NOTE — Telephone Encounter (Signed)
Notified pt FMLA form is complete/sign-ready to be pick-up at the front office.

## 2022-07-12 NOTE — Patient Instructions (Addendum)
1. Liver lesion  - Ambulatory referral to Gastroenterology  2. Hiatal hernia  - Ambulatory referral to Gastroenterology   3. Mild intermittent asthma without complication FMLA completed  4. Anxiety FMLA completed  Follow up:  Follow up as scheduled

## 2022-07-20 ENCOUNTER — Inpatient Hospital Stay: Payer: Self-pay | Admitting: Nurse Practitioner

## 2022-08-06 ENCOUNTER — Encounter: Payer: Self-pay | Admitting: Nurse Practitioner

## 2022-08-06 ENCOUNTER — Ambulatory Visit: Payer: BC Managed Care – PPO | Admitting: Nurse Practitioner

## 2022-08-06 VITALS — BP 155/96 | HR 69 | Ht 62.0 in | Wt 215.0 lb

## 2022-08-06 DIAGNOSIS — Z1231 Encounter for screening mammogram for malignant neoplasm of breast: Secondary | ICD-10-CM | POA: Diagnosis not present

## 2022-08-06 DIAGNOSIS — I10 Essential (primary) hypertension: Secondary | ICD-10-CM

## 2022-08-06 NOTE — Assessment & Plan Note (Signed)
-   MM Digital Screening   Follow up:   Follow up in 3 months

## 2022-08-06 NOTE — Telephone Encounter (Signed)
Caller & Relationship to patient:  MRN #  [id number]   Call Back Number:   Date of Last Office Visit: 07/12/2022     Date of Next Office Visit: 08/06/2022    Medication(s) to be Refilled:   Preferred Pharmacy: [facility] .Marland KitchenMarland KitchenSHEHERHERS came back in office and requested for that!  ** Please notify patient to allow 48-72 hours to process** **Let patient know to contact pharmacy at the end of the day to make sure medication is ready. ** **If patient has not been seen in a year or longer, book an appointment **Advise to use MyChart for refill requests OR to contact their pharmacy

## 2022-08-06 NOTE — Patient Instructions (Signed)
1. Encounter for screening mammogram for malignant neoplasm of breast  - MM Digital Screening   Follow up:   Follow up in 3 months

## 2022-08-06 NOTE — Progress Notes (Signed)
@Patient  ID: , female    DOB: 12-22-1970, 52 y.o.   MRN: [id number]  Chief Complaint  Patient presents with   Hospitalization Follow-up    Pt stated--right side abdominal still having pain.    Referring provider: [id number], NP   HPI  [facility] 52 y.o. female  has a past medical history of Allergy, Arthritis, Asthma, Bone spur of foot, Hypertension, and Seasonal allergies. To the [facility] for reevaluation of B/P.    Hypertension: Patient here for follow-up of elevated blood pressure. She is exercising and is adherent to low salt diet.  Patient does not regularly check B/P at home. Cardiac symptoms none. Patient denies chest pain, claudication, fatigue, and orthopnea.  Cardiovascular risk factors: none. Use of agents associated with hypertension: none. History of target organ damage: none. currently taking amlodipine-olmesartan. Has started an exercise routine. States that she has lost weight. She is feeling much better.    Denies f/c/s, n/v/d, hemoptysis, PND, leg swelling Denies chest pain or edema  Note: patient states that she is going to get married today! Fiance is with her at her visit today.      Allergies  Allergen Reactions   Mobic [Meloxicam] Hives    Pt reports she gets real bad hives from Mobic   Shrimp [Shellfish Allergy] Other (See Comments)    Wheezing.  Patient states she is ok with betadine    Immunization History  Administered Date(s) Administered   Influenza Split 04/11/2012   Influenza,inj,Quad PF,6+ Mos 04/30/2015, 06/20/2020   PFIZER(Purple Top)SARS-COV-2 Vaccination 09/28/2019, 10/29/2019   Pneumococcal Polysaccharide-23 04/30/2015   Tdap 02/14/2018    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   Asthma    Bone spur of foot    Hypertension    controlled    Seasonal allergies     Tobacco History: Social History   Tobacco Use  Smoking Status Former   Packs/day: 0.75    Types: Cigarettes   Quit date: 11/17/1988   Years since quitting: 33.7  Smokeless Tobacco Never   Counseling given: Not Answered   Outpatient Encounter Medications as of 08/06/2022  Medication Sig   albuterol (PROVENTIL) (2.5 MG/3ML) 0.083% nebulizer solution Take 3 mLs (2.5 mg total) by nebulization every 6 (six) hours as needed for wheezing or shortness of breath.   albuterol (VENTOLIN HFA) 108 (90 Base) MCG/ACT inhaler INHALE 1 TO 2 PUFFS INTO THE LUNGS EVERY 6 HOURS AS NEEDED FOR WHEEZING OR SHORTNESS OF BREATH   cetirizine (ZYRTEC) 10 MG tablet Take 10 mg by mouth daily.   cholecalciferol (VITAMIN D3) 25 MCG (1000 UNIT) tablet Take 1,000 Units by mouth daily.   hydrOXYzine (ATARAX) 10 MG tablet Take 1 tablet (10 mg total) by mouth 3 (three) times daily as needed.   LINZESS 72 MCG capsule Take 1 capsule (72 mcg total) by mouth daily before breakfast.   omeprazole (PRILOSEC) 20 MG capsule Take 1 capsule (20 mg total) by mouth daily. (Patient not taking: Reported on 07/12/2022)   traMADol (ULTRAM) 50 MG tablet Take 1 tablet (50 mg total) by mouth every 6 (six) hours as needed.   valsartan-hydrochlorothiazide (DIOVAN-HCT) 160-12.5 MG tablet Take 1 tablet by mouth daily.   zolpidem (AMBIEN) 10 MG tablet Take 1 tablet (10 mg total) by mouth at bedtime.   No facility-administered encounter medications on file as of 08/06/2022.     Review of Systems  Review of  Systems  Constitutional: Negative.   HENT: Negative.    Cardiovascular: Negative.   Gastrointestinal: Negative.   Allergic/Immunologic: Negative.   Neurological: Negative.   Psychiatric/Behavioral: Negative.         Physical Exam  BP (!) 155/96   Pulse 69   Ht 5\' 2"  (1.575 m)   Wt 215 lb (97.5 kg)   LMP 09/04/2018   SpO2 98%   BMI 39.32 kg/m   Wt Readings from Last 5 Encounters:  08/06/22 215 lb (97.5 kg)  07/09/22 190 lb (86.2 kg)  03/21/22 213 lb 4 oz (96.7 kg)  01/15/22 215 lb 3.2 oz (97.6 kg)  12/12/21 215 lb  12.8 oz (97.9 kg)     Physical Exam Vitals and nursing note reviewed.  Constitutional:      General: She is not in acute distress.    Appearance: She is well-developed.  Cardiovascular:     Rate and Rhythm: Normal rate and regular rhythm.  Pulmonary:     Effort: Pulmonary effort is normal.     Breath sounds: Normal breath sounds.  Neurological:     Mental Status: She is alert and oriented to person, place, and time.      Lab Results:  CBC    Component Value Date/Time   WBC 6.6 07/09/2022 2131   RBC 4.56 07/09/2022 2131   HGB 13.7 07/09/2022 2131   HGB 12.5 12/10/2019 1051   HCT 41.4 07/09/2022 2131   HCT 37.9 12/10/2019 1051   PLT 189 07/09/2022 2131   PLT 224 12/10/2019 1051   MCV 90.8 07/09/2022 2131   MCV 89 12/10/2019 1051   MCH 30.0 07/09/2022 2131   MCHC 33.1 07/09/2022 2131   RDW 12.9 07/09/2022 2131   RDW 13.4 12/10/2019 1051   LYMPHSABS 3.1 07/09/2022 2131   LYMPHSABS 1.6 12/10/2019 1051   MONOABS 0.5 07/09/2022 2131   EOSABS 0.3 07/09/2022 2131   EOSABS 0.1 12/10/2019 1051   BASOSABS 0.1 07/09/2022 2131   BASOSABS 0.0 12/10/2019 1051    BMET    Component Value Date/Time   NA 140 07/09/2022 2131   NA 141 09/26/2020 1428   K 3.9 07/09/2022 2131   CL 103 07/09/2022 2131   CO2 29 07/09/2022 2131   GLUCOSE 101 (H) 07/09/2022 2131   BUN 18 07/09/2022 2131   BUN 9 09/26/2020 1428   CREATININE 0.67 07/09/2022 2131   CREATININE 0.65 05/02/2017 0958   CALCIUM 9.3 07/09/2022 2131   GFRNONAA >60 07/09/2022 2131   GFRNONAA 105 05/02/2017 0958   GFRAA 125 12/10/2019 1051   GFRAA 122 05/02/2017 0958    BNP No results found for: "BNP"  ProBNP No results found for: "PROBNP"  Imaging: CT Renal Stone Study  Result Date: 07/09/2022 CLINICAL DATA:  Abdominal/flank pain, stone suspected EXAM: CT ABDOMEN AND PELVIS WITHOUT CONTRAST TECHNIQUE: Multidetector CT imaging of the abdomen and pelvis was performed following the standard protocol without IV  contrast. RADIATION DOSE REDUCTION: This exam was performed according to the departmental dose-optimization program which includes automated exposure control, adjustment of the mA and/or kV according to patient size and/or use of iterative reconstruction technique. COMPARISON:  CT abdomen pelvis 12/17/2011 FINDINGS: Lower chest: No acute abnormality.  Trace hiatal hernia. Hepatobiliary: Slightly increased in size chronic subcentimeter hypodensity along the right hepatic lobe dome too small to characterize (5:55). Status post cholecystectomy. No biliary dilatation. Pancreas: No focal lesion. Normal pancreatic contour. No surrounding inflammatory changes. No main pancreatic ductal dilatation. Spleen: Normal in size without  focal abnormality. Adrenals/Urinary Tract: No adrenal nodule bilaterally. No nephrolithiasis and no hydronephrosis. No definite contour-deforming renal mass. No ureterolithiasis or hydroureter. The urinary bladder is unremarkable. Stomach/Bowel: Stomach is within normal limits. No evidence of bowel wall thickening or dilatation. Appendix appears normal. Vascular/Lymphatic: No abdominal aorta or iliac aneurysm. No abdominal, pelvic, or inguinal lymphadenopathy. Reproductive: Uterus and bilateral adnexa are unremarkable. Other: No intraperitoneal free fluid. No intraperitoneal free gas. No organized fluid collection. Musculoskeletal: Tiny fat containing umbilical hernia. No suspicious lytic or blastic osseous lesions. No acute displaced fracture. IMPRESSION: 1. No acute intra-abdominal intrapelvic abnormality with limited evaluation on this noncontrast study. 2. Tiny hiatal hernia. Electronically Signed   By: Tish Frederickson M.D.   On: 07/09/2022 23:49     Assessment & Plan:   Encounter for screening mammogram for malignant neoplasm of breast - MM Digital Screening   Follow up:   Follow up in 3 months     Ivonne Andrew, NP 08/06/2022

## 2022-08-14 ENCOUNTER — Encounter (HOSPITAL_BASED_OUTPATIENT_CLINIC_OR_DEPARTMENT_OTHER): Payer: Self-pay

## 2022-08-14 ENCOUNTER — Other Ambulatory Visit: Payer: Self-pay

## 2022-08-14 ENCOUNTER — Emergency Department (HOSPITAL_BASED_OUTPATIENT_CLINIC_OR_DEPARTMENT_OTHER): Payer: BC Managed Care – PPO

## 2022-08-14 ENCOUNTER — Emergency Department (HOSPITAL_BASED_OUTPATIENT_CLINIC_OR_DEPARTMENT_OTHER)
Admission: EM | Admit: 2022-08-14 | Discharge: 2022-08-14 | Disposition: A | Discharge status: Home / Self Care | Payer: BC Managed Care – PPO | Attending: Emergency Medicine | Admitting: Emergency Medicine

## 2022-08-14 DIAGNOSIS — R1011 Right upper quadrant pain: Secondary | ICD-10-CM | POA: Diagnosis not present

## 2022-08-14 DIAGNOSIS — R9389 Abnormal findings on diagnostic imaging of other specified body structures: Secondary | ICD-10-CM

## 2022-08-14 DIAGNOSIS — K838 Other specified diseases of biliary tract: Secondary | ICD-10-CM

## 2022-08-14 LAB — CBC WITH DIFFERENTIAL/PLATELET
Abs Immature Granulocytes: 0.01 10*3/uL (ref 0.00–0.07)
Basophils Absolute: 0 10*3/uL (ref 0.0–0.1)
Basophils Relative: 1 %
Eosinophils Absolute: 0.2 10*3/uL (ref 0.0–0.5)
Eosinophils Relative: 5 %
HCT: 41.1 % (ref 36.0–46.0)
Hemoglobin: 13.6 g/dL (ref 12.0–15.0)
Immature Granulocytes: 0 %
Lymphocytes Relative: 33 %
Lymphs Abs: 1.3 10*3/uL (ref 0.7–4.0)
MCH: 29.6 pg (ref 26.0–34.0)
MCHC: 33.1 g/dL (ref 30.0–36.0)
MCV: 89.3 fL (ref 80.0–100.0)
Monocytes Absolute: 0.5 10*3/uL (ref 0.1–1.0)
Monocytes Relative: 13 %
Neutro Abs: 2 10*3/uL (ref 1.7–7.7)
Neutrophils Relative %: 48 %
Platelets: 179 10*3/uL (ref 150–400)
RBC: 4.6 MIL/uL (ref 3.87–5.11)
RDW: 12.8 % (ref 11.5–15.5)
WBC: 4 10*3/uL (ref 4.0–10.5)
nRBC: 0 % (ref 0.0–0.2)

## 2022-08-14 LAB — COMPREHENSIVE METABOLIC PANEL
ALT: 25 U/L (ref 0–44)
AST: 24 U/L (ref 15–41)
Albumin: 3.8 g/dL (ref 3.5–5.0)
Alkaline Phosphatase: 67 U/L (ref 38–126)
Anion gap: 7 (ref 5–15)
BUN: 13 mg/dL (ref 6–20)
CO2: 27 mmol/L (ref 22–32)
Calcium: 8.6 mg/dL — ABNORMAL LOW (ref 8.9–10.3)
Chloride: 105 mmol/L (ref 98–111)
Creatinine, Ser: 0.62 mg/dL (ref 0.44–1.00)
GFR, Estimated: >60 mL/min (ref >60)
Glucose, Bld: 123 mg/dL — ABNORMAL HIGH (ref 70–99)
Potassium: 3.8 mmol/L (ref 3.5–5.1)
Sodium: 139 mmol/L (ref 135–145)
Total Bilirubin: 0.7 mg/dL (ref 0.3–1.2)
Total Protein: 7 g/dL (ref 6.5–8.1)

## 2022-08-14 LAB — URINALYSIS, ROUTINE W REFLEX MICROSCOPIC
Bacteria, UA: NONE SEEN
Bilirubin Urine: NEGATIVE
Glucose, UA: NEGATIVE mg/dL
Hgb urine dipstick: NEGATIVE
Ketones, ur: NEGATIVE mg/dL
Nitrite: NEGATIVE
Specific Gravity, Urine: 1.027 (ref 1.005–1.030)
pH: 6 (ref 5.0–8.0)

## 2022-08-14 LAB — LIPASE, BLOOD: Lipase: 29 U/L (ref 11–51)

## 2022-08-14 LAB — PREGNANCY, URINE: Preg Test, Ur: NEGATIVE

## 2022-08-14 MED ORDER — ONDANSETRON 4 MG PO TBDP: ORAL_TABLET | ORAL | 0 refills | Status: AC

## 2022-08-14 MED ORDER — SODIUM CHLORIDE 0.9 % IV BOLUS
1000.0000 mL | Freq: Once | INTRAVENOUS | Status: AC
  Administered 2022-08-14: 1000 mL via INTRAVENOUS

## 2022-08-14 MED ORDER — DICYCLOMINE HCL 20 MG PO TABS: 20.0000 mg | ORAL_TABLET | Freq: Two times a day (BID) | ORAL | 0 refills | Status: AC

## 2022-08-14 MED ORDER — IOHEXOL 300 MG/ML  SOLN
100.0000 mL | Freq: Once | INTRAMUSCULAR | Status: AC | PRN
  Administered 2022-08-14: 85 mL via INTRAVENOUS

## 2022-08-14 MED ORDER — OMEPRAZOLE 20 MG PO CPDR: 20.0000 mg | DELAYED_RELEASE_CAPSULE | Freq: Every day | ORAL | 0 refills | Status: AC

## 2022-08-14 MED ORDER — FENTANYL CITRATE PF 50 MCG/ML IJ SOSY
50.0000 ug | PREFILLED_SYRINGE | Freq: Once | INTRAMUSCULAR | Status: AC
  Administered 2022-08-14: 50 ug via INTRAVENOUS
  Filled 2022-08-14: qty 1

## 2022-08-14 MED ORDER — MORPHINE SULFATE 15 MG PO TABS: 7.5000 mg | ORAL_TABLET | ORAL | 0 refills | Status: AC | PRN

## 2022-08-14 MED ORDER — ONDANSETRON HCL 4 MG/2ML IJ SOLN
4.0000 mg | Freq: Once | INTRAMUSCULAR | Status: AC
  Administered 2022-08-14: 4 mg via INTRAVENOUS
  Filled 2022-08-14: qty 2

## 2022-08-14 NOTE — ED Triage Notes (Signed)
POV from home, amb to room, A&O x 4.  Pt sts 3 days ago she began having upper abd pain that feels similar to prior pancreatitis flares.

## 2022-08-14 NOTE — ED Notes (Signed)
Patient transported to Ultrasound 

## 2022-08-14 NOTE — ED Notes (Signed)
Pt back from ultrasound, IV fluids restarted and pt back on monitor. Sts pain improved to 4/10

## 2022-08-14 NOTE — ED Provider Notes (Signed)
Received patient in turnover from Dr. Randal Buba.  Please see their note for further details of Hx, PE.  Briefly patient is a 52 y.o. female with a Abdominal Pain .  Feels like when she had pancreatitis in the past.  Going on for about 3 days.  Symptoms worsen when she eats.  Has had some nausea and vomiting.  Lab work here without obvious pancreatitis.  Right upper quadrant ultrasound with some common bile duct dilatation CT abdomen pelvis with pancreatic duct dilatation not seen on prior.  Some concern from radiology for stone.  Will discuss with gastroenterology.  I discussed the case with Dr. Havery Moros, who recommends close outpatient follow-up for MRCP.  Patient feeling much better on repeat assessment.  Good with the plan.      Deno Etienne, DO 08/14/22 [phone]

## 2022-08-14 NOTE — Discharge Instructions (Signed)

## 2022-08-14 NOTE — ED Provider Notes (Signed)
[facility] EMERGENCY DEPT Provider Note   CSN: [id number] Arrival date & time: 08/14/22  0546     History  Chief Complaint  Patient presents with   Abdominal Pain    Wendy James is a 52 y.o. female.  The history is provided by the patient.  Abdominal Pain Pain location:  RUQ Pain radiates to:  Does not radiate Pain severity:  Severe Onset quality:  Gradual Duration:  2 days Timing:  Constant Progression:  Unchanged Chronicity:  Recurrent Context: eating   Context comment:  Salmon Relieved by:  Nothing Worsened by:  Nothing Associated symptoms: no chest pain, no cough, no fever and no vomiting   Risk factors: not pregnant   Patient seen last month for pancreatitis presents with RUQ abdominal pain after eating salmon.       Home Medications Prior to Admission medications   Medication Sig Start Date End Date Taking? Authorizing Provider  albuterol (PROVENTIL) (2.5 MG/3ML) 0.083% nebulizer solution Take 3 mLs (2.5 mg total) by nebulization every 6 (six) hours as needed for wheezing or shortness of breath. 11/01/21   Passmore, Enid Derry I, NP  albuterol (VENTOLIN HFA) 108 (90 Base) MCG/ACT inhaler INHALE 1 TO 2 PUFFS INTO THE LUNGS EVERY 6 HOURS AS NEEDED FOR WHEEZING OR SHORTNESS OF BREATH 05/21/22   Ivonne Andrew, NP  cetirizine (ZYRTEC) 10 MG tablet Take 10 mg by mouth daily. 07/09/22   [provider]  cholecalciferol (VITAMIN D3) 25 MCG (1000 UNIT) tablet Take 1,000 Units by mouth daily.    [provider]  hydrOXYzine (ATARAX) 10 MG tablet Take 1 tablet (10 mg total) by mouth 3 (three) times daily as needed. 03/21/22   Ivonne Andrew, NP  LINZESS 72 MCG capsule Take 1 capsule (72 mcg total) by mouth daily before breakfast. 09/07/21   Barbette Merino, NP  omeprazole (PRILOSEC) 20 MG capsule Take 1 capsule (20 mg total) by mouth daily. Patient not taking: Reported on 07/12/2022 07/10/22   Hiroshi Krummel, MD  traMADol (ULTRAM) 50 MG  tablet Take 1 tablet (50 mg total) by mouth every 6 (six) hours as needed. 07/10/22   Loris Seelye, MD  valsartan-hydrochlorothiazide (DIOVAN-HCT) 160-12.5 MG tablet Take 1 tablet by mouth daily. 04/12/22   Ivonne Andrew, NP  zolpidem (AMBIEN) 10 MG tablet Take 1 tablet (10 mg total) by mouth at bedtime. 05/28/22   Massie Maroon, FNP      Allergies    Mobic [meloxicam] and Shrimp [shellfish allergy]    Review of Systems   Review of Systems  Constitutional:  Negative for fever.  HENT:  Negative for facial swelling.   Respiratory:  Negative for cough.   Cardiovascular:  Negative for chest pain.  Gastrointestinal:  Positive for abdominal pain. Negative for vomiting.  All other systems reviewed and are negative.   Physical Exam Updated Vital Signs BP (!) 155/95   Pulse 66   Temp 98.1 F (36.7 C) (Oral)   Resp 19   Ht 5\' 2"  (1.575 m)   Wt 97.5 kg   LMP 09/04/2018   SpO2 97%   BMI 39.31 kg/m  Physical Exam Vitals and nursing note reviewed. Exam conducted with a chaperone present.  Constitutional:      General: She is not in acute distress.    Appearance: She is well-developed.  HENT:     Head: Normocephalic and atraumatic.  Eyes:     Pupils: Pupils are equal, round, and reactive to light.  Cardiovascular:  Rate and Rhythm: Normal rate and regular rhythm.     Pulses: Normal pulses.     Heart sounds: Normal heart sounds.  Pulmonary:     Effort: Pulmonary effort is normal. No respiratory distress.     Breath sounds: Normal breath sounds.  Abdominal:     General: Bowel sounds are normal. There is no distension.     Palpations: Abdomen is soft.     Tenderness: There is no abdominal tenderness. There is no guarding or rebound.  Genitourinary:    Vagina: No vaginal discharge.  Musculoskeletal:        General: Normal range of motion.     Cervical back: Neck supple.  Skin:    General: Skin is warm and dry.     Capillary Refill: Capillary refill takes less than 2  seconds.     Findings: No erythema or rash.  Neurological:     General: No focal deficit present.     Mental Status: She is oriented to person, place, and time.     Deep Tendon Reflexes: Reflexes normal.  Psychiatric:        Mood and Affect: Mood normal.     ED Results / Procedures / Treatments   Labs (all labs ordered are listed, but only abnormal results are displayed) Results for orders placed or performed during the hospital encounter of 08/14/22  CBC with Differential  Result Value Ref Range   WBC 4.0 4.0 - 10.5 K/uL   RBC 4.60 3.87 - 5.11 MIL/uL   Hemoglobin 13.6 12.0 - 15.0 g/dL   HCT 41.1 36.0 - 46.0 %   MCV 89.3 80.0 - 100.0 fL   MCH 29.6 26.0 - 34.0 pg   MCHC 33.1 30.0 - 36.0 g/dL   RDW 12.8 11.5 - 15.5 %   Platelets 179 150 - 400 K/uL   nRBC 0.0 0.0 - 0.2 %   Neutrophils Relative % 48 %   Neutro Abs 2.0 1.7 - 7.7 K/uL   Lymphocytes Relative 33 %   Lymphs Abs 1.3 0.7 - 4.0 K/uL   Monocytes Relative 13 %   Monocytes Absolute 0.5 0.1 - 1.0 K/uL   Eosinophils Relative 5 %   Eosinophils Absolute 0.2 0.0 - 0.5 K/uL   Basophils Relative 1 %   Basophils Absolute 0.0 0.0 - 0.1 K/uL   Immature Granulocytes 0 %   Abs Immature Granulocytes 0.01 0.00 - 0.07 K/uL  Comprehensive metabolic panel  Result Value Ref Range   Sodium 139 135 - 145 mmol/L   Potassium 3.8 3.5 - 5.1 mmol/L   Chloride 105 98 - 111 mmol/L   CO2 27 22 - 32 mmol/L   Glucose, Bld 123 (H) 70 - 99 mg/dL   BUN 13 6 - 20 mg/dL   Creatinine, Ser 0.62 0.44 - 1.00 mg/dL   Calcium 8.6 (L) 8.9 - 10.3 mg/dL   Total Protein 7.0 6.5 - 8.1 g/dL   Albumin 3.8 3.5 - 5.0 g/dL   AST 24 15 - 41 U/L   ALT 25 0 - 44 U/L   Alkaline Phosphatase 67 38 - 126 U/L   Total Bilirubin 0.7 0.3 - 1.2 mg/dL   GFR, Estimated >60 >60 mL/min   Anion gap 7 5 - 15  Lipase, blood  Result Value Ref Range   Lipase 29 11 - 51 U/L  Pregnancy, urine  Result Value Ref Range   Preg Test, Ur NEGATIVE NEGATIVE  Urinalysis, Routine w  reflex microscopic Urine, Clean Catch  Result Value Ref Range   Color, Urine YELLOW YELLOW   APPearance CLEAR CLEAR   Specific Gravity, Urine 1.027 1.005 - 1.030   pH 6.0 5.0 - 8.0   Glucose, UA NEGATIVE NEGATIVE mg/dL   Hgb urine dipstick NEGATIVE NEGATIVE   Bilirubin Urine NEGATIVE NEGATIVE   Ketones, ur NEGATIVE NEGATIVE mg/dL   Protein, ur TRACE (A) NEGATIVE mg/dL   Nitrite NEGATIVE NEGATIVE   Leukocytes,Ua TRACE (A) NEGATIVE   RBC / HPF 0-5 0 - 5 RBC/hpf   WBC, UA 6-10 0 - 5 WBC/hpf   Bacteria, UA NONE SEEN NONE SEEN   Squamous Epithelial / HPF 0-5 0 - 5 /HPF   Mucus PRESENT    No results found.   Radiology No results found.  Procedures Procedures    Medications Ordered in ED Medications  fentaNYL (SUBLIMAZE) injection 50 mcg (50 mcg Intravenous Given 08/14/22 0608)  sodium chloride 0.9 % bolus 1,000 mL (1,000 mLs Intravenous New Bag/Given 08/14/22 0609)  ondansetron (ZOFRAN) injection 4 mg (4 mg Intravenous Given 08/14/22 0609)    ED Course/ Medical Decision Making/ A&P                             Medical Decision Making Patient with 2 days of RUQ pain   Amount and/or Complexity of Data Reviewed External Data Reviewed: labs, radiology and notes.    Details: My previous ED notes labs and imaging reviewed  Labs: ordered.    Details: All labs reviewed: negative urine, negative pregnancy test.  Normal sodium 139, normal potassium 3.8, normal creatinine .62, normal LFTs, normal Lipase 29.  Normal white count 4, normal hemoglobin 13.6, normal platelet count  Radiology: ordered.  Risk Prescription drug management. Parenteral controlled substances. Risk Details: Patient does not have pancreatitis today.  All labs are benign and reassuring.  Awaiting imaging for disposition.      Final Clinical Impression(s) / ED Diagnoses Final diagnoses:  Right upper quadrant abdominal pain   Signed out to Dr. Tyrone Nine pending imaging  Rx / DC Orders ED Discharge Orders      None         Adlene Adduci, MD 08/14/22 [phone]

## 2022-08-14 NOTE — ED Notes (Signed)
Pt given discharge instructions and reviewed prescriptions. Opportunities given for questions. Pt verbalizes understanding. Leanne Chang, RN

## 2022-08-17 ENCOUNTER — Telehealth: Payer: Self-pay

## 2022-08-17 NOTE — Telephone Encounter (Signed)
Transition Care Management Unsuccessful Follow-up Telephone Call  Date of discharge and from where:  08/14/22  Attempts:  1st Attempt  Reason for unsuccessful TCM follow-up call:  Left voice message  Elyse Jarvis RMA

## 2022-08-22 ENCOUNTER — Other Ambulatory Visit: Payer: Self-pay | Admitting: Family Medicine

## 2022-08-22 ENCOUNTER — Other Ambulatory Visit: Payer: Self-pay | Admitting: Internal Medicine

## 2022-08-22 ENCOUNTER — Ambulatory Visit (HOSPITAL_COMMUNITY)
Admission: RE | Admit: 2022-08-22 | Discharge: 2022-08-22 | Disposition: A | Discharge status: Home / Self Care | Payer: BC Managed Care – PPO | Source: Ambulatory Visit | Attending: Internal Medicine | Admitting: Internal Medicine

## 2022-08-22 DIAGNOSIS — G4700 Insomnia, unspecified: Secondary | ICD-10-CM

## 2022-08-22 DIAGNOSIS — R9389 Abnormal findings on diagnostic imaging of other specified body structures: Secondary | ICD-10-CM

## 2022-08-22 MED ORDER — GADOBUTROL 1 MMOL/ML IV SOLN
10.0000 mL | Freq: Once | INTRAVENOUS | Status: AC | PRN
  Administered 2022-08-22: 10 mL via INTRAVENOUS

## 2022-08-22 NOTE — Telephone Encounter (Signed)
Please advise KH 

## 2022-08-23 ENCOUNTER — Other Ambulatory Visit: Payer: Self-pay | Admitting: Nurse Practitioner

## 2022-08-26 ENCOUNTER — Other Ambulatory Visit: Payer: Self-pay | Admitting: Nurse Practitioner

## 2022-08-26 DIAGNOSIS — G4700 Insomnia, unspecified: Secondary | ICD-10-CM

## 2022-08-27 NOTE — Telephone Encounter (Signed)
Caller & Relationship to patient:  MRN #  [id number]   Call Back Number:   Date of Last Office Visit: 08/23/2022     Date of Next Office Visit: 10/29/2022    Medication(s) to be Refilled: Lorrin Mais  Preferred Pharmacy:  [facility]  ** Please notify patient to allow 48-72 hours to process** **Let patient know to contact pharmacy at the end of the day to make sure medication is ready. ** **If patient has not been seen in a year or longer, book an appointment **Advise to use MyChart for refill requests OR to contact their pharmacy

## 2022-08-27 NOTE — Telephone Encounter (Signed)
Caller & Relationship to patient:  MRN #  [id number]   Call Back Number:   Date of Last Office Visit: 08/23/2022     Date of Next Office Visit: 10/29/2022    Medication(s) to be Refilled: Ambien  Preferred Pharmacy:   ** Please notify patient to allow 48-72 hours to process** **Let patient know to contact pharmacy at the end of the day to make sure medication is ready. ** **If patient has not been seen in a year or longer, book an appointment **Advise to use MyChart for refill requests OR to contact their pharmacy

## 2022-08-27 NOTE — Telephone Encounter (Signed)
Caller & Relationship to patient:  MRN #  [id number]   Call Back Number:   Date of Last Office Visit: 08/23/2022     Date of Next Office Visit: 10/29/2022    Medication(s) to be Refilled: ambien  Preferred Pharmacy:  [facility]  ** Please notify patient to allow 48-72 hours to process** **Let patient know to contact pharmacy at the end of the day to make sure medication is ready. ** **If patient has not been seen in a year or longer, book an appointment **Advise to use MyChart for refill requests OR to contact their pharmacy    

## 2022-08-28 ENCOUNTER — Emergency Department (HOSPITAL_COMMUNITY): Payer: BC Managed Care – PPO

## 2022-08-28 ENCOUNTER — Other Ambulatory Visit: Payer: Self-pay

## 2022-08-28 ENCOUNTER — Encounter (HOSPITAL_COMMUNITY): Payer: Self-pay

## 2022-08-28 ENCOUNTER — Emergency Department (HOSPITAL_COMMUNITY): Admission: EM | Admit: 2022-08-28 | Payer: BC Managed Care – PPO | Source: Home / Self Care

## 2022-08-28 ENCOUNTER — Emergency Department (HOSPITAL_COMMUNITY)
Admission: EM | Admit: 2022-08-28 | Discharge: 2022-08-28 | Disposition: A | Discharge status: Home / Self Care | Payer: BC Managed Care – PPO | Attending: Emergency Medicine | Admitting: Emergency Medicine

## 2022-08-28 DIAGNOSIS — I10 Essential (primary) hypertension: Secondary | ICD-10-CM | POA: Insufficient documentation

## 2022-08-28 DIAGNOSIS — Z79899 Other long term (current) drug therapy: Secondary | ICD-10-CM | POA: Diagnosis not present

## 2022-08-28 DIAGNOSIS — R0602 Shortness of breath: Secondary | ICD-10-CM | POA: Diagnosis present

## 2022-08-28 DIAGNOSIS — J4541 Moderate persistent asthma with (acute) exacerbation: Secondary | ICD-10-CM

## 2022-08-28 DIAGNOSIS — G4700 Insomnia, unspecified: Secondary | ICD-10-CM

## 2022-08-28 MED ORDER — ALBUTEROL SULFATE (2.5 MG/3ML) 0.083% IN NEBU
2.5000 mg | INHALATION_SOLUTION | Freq: Once | RESPIRATORY_TRACT | Status: AC
  Administered 2022-08-28: 2.5 mg via RESPIRATORY_TRACT
  Filled 2022-08-28: qty 3

## 2022-08-28 MED ORDER — PREDNISONE 20 MG PO TABS
60.0000 mg | ORAL_TABLET | Freq: Once | ORAL | Status: AC
  Administered 2022-08-28: 60 mg via ORAL
  Filled 2022-08-28: qty 3

## 2022-08-28 MED ORDER — IPRATROPIUM-ALBUTEROL 0.5-2.5 (3) MG/3ML IN SOLN
3.0000 mL | Freq: Once | RESPIRATORY_TRACT | Status: AC
  Administered 2022-08-28: 3 mL via RESPIRATORY_TRACT
  Filled 2022-08-28: qty 3

## 2022-08-28 MED ORDER — ZOLPIDEM TARTRATE 10 MG PO TABS: 10.0000 mg | ORAL_TABLET | Freq: Every day | ORAL | 0 refills | Status: DC

## 2022-08-28 MED ORDER — DEXAMETHASONE 4 MG PO TABS: ORAL_TABLET | ORAL | 0 refills | Status: AC

## 2022-08-28 MED ORDER — MAGNESIUM SULFATE 2 GM/50ML IV SOLN
2.0000 g | Freq: Once | INTRAVENOUS | Status: AC
  Administered 2022-08-28: 2 g via INTRAVENOUS
  Filled 2022-08-28: qty 50

## 2022-08-28 NOTE — ED Notes (Signed)
Patient urine sent to the lab.

## 2022-08-28 NOTE — ED Notes (Signed)
Walked in room Sp02 95-96 while walking, wheezing and states "she needs to lay back down."

## 2022-08-28 NOTE — Telephone Encounter (Signed)
From: Marguarite Arbour To: Office of Cammie Sickle, [place] Sent: 08/27/2022 1:31 PM EST Subject: Medication Renewal Request  Refills have been requested for the following medications:   zolpidem (AMBIEN) 10 MG tablet [Lachina Hollis]  Preferred pharmacy: [facility], [place] - [address] AT [address] Delivery method: [employer]

## 2022-08-28 NOTE — Discharge Instructions (Addendum)
Decadron dose tomorrow and Thursday. Recheck with your doctor on Friday, return to ER for worsening or concerning symptoms.  Continue with your home nebulizer and inhaler as directed.

## 2022-08-28 NOTE — ED Triage Notes (Signed)
Pt c/o difficulty breathing, wheezing & asthma attack.

## 2022-08-28 NOTE — ED Provider Notes (Signed)
[place] EMERGENCY DEPARTMENT AT [facility] Provider Note   CSN: [id number] Arrival date & time: 08/28/22  5009     History  Chief Complaint  Patient presents with   Shortness of Breath    Wendy James is a 52 y.o. female.  52 year old female with past medical history of asthma presents with concern for asthma exacerbation.  Reports onset of symptoms yesterday with the weather change, using home inhaler without any improvement.  Denies fevers, chills, congestion or other URI symptoms.  Previously admitted related to asthma exacerbation several years ago, never intubated.  No other complaints or concerns today.       Home Medications Prior to Admission medications   Medication Sig Start Date End Date Taking? Authorizing Provider  dexamethasone (DECADRON) 4 MG tablet Take 4 tablets (16 mg total) by mouth daily for 1 day, THEN 4 tablets (16 mg total) daily for 1 day. 08/28/22 08/30/22 Yes Tacy Learn, PA-C  albuterol (PROVENTIL) (2.5 MG/3ML) 0.083% nebulizer solution Take 3 mLs (2.5 mg total) by nebulization every 6 (six) hours as needed for wheezing or shortness of breath. 11/01/21   Passmore, Jake Church I, NP  albuterol (VENTOLIN HFA) 108 (90 Base) MCG/ACT inhaler INHALE 1 TO 2 PUFFS INTO THE LUNGS EVERY 6 HOURS AS NEEDED FOR WHEEZING OR SHORTNESS OF BREATH 05/21/22   Fenton Foy, NP  cetirizine (ZYRTEC) 10 MG tablet Take 10 mg by mouth daily. 07/09/22   [provider]  cholecalciferol (VITAMIN D3) 25 MCG (1000 UNIT) tablet Take 1,000 Units by mouth daily.    [provider]  dicyclomine (BENTYL) 20 MG tablet Take 1 tablet (20 mg total) by mouth 2 (two) times daily. 08/14/22   Palumbo, April, MD  doxycycline (VIBRAMYCIN) 100 MG capsule Take 100 mg by mouth 2 (two) times daily. 07/25/22   [provider]  hydrOXYzine (ATARAX) 10 MG tablet Take 1 tablet (10 mg total) by mouth 3 (three) times daily as needed. 03/21/22   Fenton Foy, NP   LINZESS 72 MCG capsule Take 1 capsule (72 mcg total) by mouth daily before breakfast. 09/07/21   Vevelyn Francois, NP  morphine (MSIR) 15 MG tablet Take 0.5 tablets (7.5 mg total) by mouth every 4 (four) hours as needed for severe pain. 08/14/22   Deno Etienne, DO  omeprazole (PRILOSEC) 20 MG capsule Take 1 capsule (20 mg total) by mouth daily. Patient not taking: Reported on 07/12/2022 07/10/22   Palumbo, April, MD  omeprazole (PRILOSEC) 20 MG capsule Take 1 capsule (20 mg total) by mouth daily. 08/14/22   Palumbo, April, MD  ondansetron (ZOFRAN-ODT) 4 MG disintegrating tablet 4mg  ODT q4 hours prn nausea/vomit 08/14/22   Deno Etienne, DO  traMADol (ULTRAM) 50 MG tablet Take 1 tablet (50 mg total) by mouth every 6 (six) hours as needed. 07/10/22   Palumbo, April, MD  valsartan-hydrochlorothiazide (DIOVAN-HCT) 160-12.5 MG tablet Take 1 tablet by mouth daily. 04/12/22   Fenton Foy, NP  zolpidem (AMBIEN) 10 MG tablet TAKE 1 TABLET(10 MG) BY MOUTH AT BEDTIME 08/27/22   Fenton Foy, NP      Allergies    Mobic [meloxicam] and Shrimp [shellfish allergy]    Review of Systems   Review of Systems Negative except as per HPI Physical Exam Updated Vital Signs BP (!) 178/96   Pulse 71   Temp 98.7 F (37.1 C) (Oral)   Resp 17   Ht 5\' 2"  (1.575 m)   Wt 97.5 kg  LMP 09/04/2018   SpO2 98%   BMI 39.31 kg/m  Physical Exam Vitals and nursing note reviewed.  Constitutional:      General: She is not in acute distress.    Appearance: She is well-developed. She is not diaphoretic.  HENT:     Head: Normocephalic and atraumatic.  Cardiovascular:     Rate and Rhythm: Normal rate and regular rhythm.  Pulmonary:     Effort: Pulmonary effort is normal.     Breath sounds: Examination of the right-lower field reveals decreased breath sounds. Examination of the left-lower field reveals decreased breath sounds. Decreased breath sounds present. No wheezing.  Musculoskeletal:     Cervical back: Neck supple.      Right lower leg: No tenderness. No edema.     Left lower leg: No tenderness. No edema.  Skin:    General: Skin is warm and dry.  Neurological:     Mental Status: She is alert and oriented to person, place, and time.  Psychiatric:        Behavior: Behavior normal.     ED Results / Procedures / Treatments   Labs (all labs ordered are listed, but only abnormal results are displayed) Labs Reviewed - No data to display  EKG None  Radiology DG Chest 2 View  Result Date: 08/28/2022 CLINICAL DATA:  Wheezing and difficulty breathing. EXAM: CHEST - 2 VIEW COMPARISON:  June 21, 2020 FINDINGS: The heart size and mediastinal contours are within normal limits. Mildly increased infrahilar lung markings are noted, bilaterally. There is no evidence of focal consolidation, pleural effusion or pneumothorax. Radiopaque surgical clips are seen within the right upper quadrant. The visualized skeletal structures are unremarkable. IMPRESSION: No evidence of acute or active cardiopulmonary disease. Electronically Signed   By: Virgina Norfolk M.D.   On: 08/28/2022 06:47    Procedures Procedures    Medications Ordered in ED Medications  ipratropium-albuterol (DUONEB) 0.5-2.5 (3) MG/3ML nebulizer solution 3 mL (3 mLs Nebulization Given 08/28/22 0530)  predniSONE (DELTASONE) tablet 60 mg (60 mg Oral Given 08/28/22 0529)  albuterol (PROVENTIL) (2.5 MG/3ML) 0.083% nebulizer solution 2.5 mg (2.5 mg Nebulization Given 08/28/22 0720)  magnesium sulfate IVPB 2 g 50 mL (0 g Intravenous Stopped 08/28/22 1108)    ED Course/ Medical Decision Making/ A&P                             Medical Decision Making Risk Prescription drug management.   This patient presents to the ED for concern of asthma exacerbation, this involves an extensive number of treatment options, and is a complaint that carries with it a high risk of complications and morbidity.  The differential diagnosis includes URI, asthma  exacerbation    Co morbidities that complicate the patient evaluation  Asthma, HTN, prediabetes   Additional history obtained:  External records from outside source obtained and reviewed including prior labs and imaging on file  Imaging Studies ordered:  I ordered imaging studies including CXR  I independently visualized and interpreted imaging which showed no acute process I agree with the radiologist interpretation   Consultations Obtained:  I requested consultation with the Er attending, Dr. Ky Barban,  and discussed lab and imaging findings as well as pertinent plan - they recommend: agree with plan of care   Problem List / ED Course / Critical interventions / Medication management  52 year old female with history of asthma presents with complaint of asthma exacerbation not  improving with home nebulizer and inhaler treatments. Provided with prednisone and duoneb prior to my evaluation, reports slight improvement, with mild wheezing on my initial exam. Provided with continuous neb, reports ongoing wheezing with any degree of activity. Provided with IV Mg, appears comfortable at rest, speaks in complete sentences, resp even and unlabored. Lungs CTA. After lung exam, noted audible wheezing in conversation.  Patient is ambulatory and able to maintain sats. Plan is to dc on steroids, will change to decadron (reports rash with prednisone). Has home meds, does not need refill. BP likely elevated due to missed AM dose while in the ER. Plan to see PCP by Friday, return as needed to ER.  I ordered medication including duoneb, prednisone, cont neb, magnesium  for asthma exacerbation   Reevaluation of the patient after these medicines showed that the patient improved I have reviewed the patients home medicines and have made adjustments as needed   Social Determinants of Health:  Has PCP    Test / Admission - Considered:  Ambulatory and able to maintain sats greater than 95% on room  air         Final Clinical Impression(s) / ED Diagnoses Final diagnoses:  Moderate persistent asthma with exacerbation    Rx / DC Orders ED Discharge Orders          Ordered    dexamethasone (DECADRON) 4 MG tablet  Daily        08/28/22 1216              Roque Lias 08/28/22 1319    Lacretia Leigh, MD 08/28/22 1430

## 2022-08-28 NOTE — Telephone Encounter (Signed)
Since Wendy James is out please advise if either of you will fill or if it is denied. Thanks [employer]

## 2022-08-29 ENCOUNTER — Telehealth: Payer: Self-pay

## 2022-08-29 NOTE — Telephone Encounter (Signed)
Transition Care Management Follow-up Telephone Call Date of discharge and from where: 08/28/22 How have you been since you were released from the hospital? PER PT She feels better Any questions or concerns? No  Items Reviewed: Did the pt receive and understand the discharge instructions provided? Yes  Medications obtained and verified? Yes  Other? No  Any new allergies since your discharge? No  Dietary orders reviewed? No Do you have support at home? Yes   Follow up appointments reviewed:  PCP Hospital f/u appt confirmed? Yes  Scheduled to see Tonya on 09/05/22 @ 1:20pm. [facility] f/u appt confirmed? No   Are transportation arrangements needed? no If their condition worsens, is the pt aware to call PCP or go to the Emergency Dept.? Yes Was the patient provided with contact information for the PCP's office or ED? Yes Was to pt encouraged to call back with questions or concerns? Yes    .kh

## 2022-09-04 ENCOUNTER — Other Ambulatory Visit: Payer: BC Managed Care – PPO | Admitting: Pharmacist

## 2022-09-04 NOTE — Progress Notes (Signed)
Patient appearing on report for [employer] - Hypertension Control report due to last documented ambulatory blood pressure of 155/96 on 08/06/22. Next appointment with PCP is tomorrow    Outreached patient to discuss hypertension control and medication management.   Current antihypertensives: valsartan/HCTZ 160/12.5 mg daily  Patient does not have an automated upper arm home BP machine.  Current meal patterns: reports she met with a nutritionist at her job; reports they advised her to go vegan  Current physical activity: has a plan for physical activity; 20 minutes at home a few days a week  Patient denies hypotensive signs and symptoms including dizziness, lightheadedness.  Patient denies hypertensive symptoms including headache, chest pain, shortness of breath.  Patient denies side effects related to antihypertensives     Assessment/Plan: - Currently unknown home control - - Reviewed appropriate home BP monitoring technique (avoid caffeine, smoking, and exercise for 30 minutes before checking, rest for at least 5 minutes before taking BP, sit with feet flat on the floor and back against a hard surface, uncross legs, and rest arm on flat surface). Counseled to consider purchasing an [employer] Series 3 Upper Arm Blood Presure - Discussed dietary modifications, such as reduced salt intake, focus on whole grains, vegetables, lean proteins - Discussed goal physical activity weekly  Follow up call in ~ 6 weeks  Catie Hedwig Morton, PharmD, [place], [facility] [phone]

## 2022-09-04 NOTE — Patient Instructions (Signed)
Check your blood pressure once weekly, and any time you have concerning symptoms like headache, chest pain, dizziness, shortness of breath, or vision changes.   Our goal is less than 130/80.  To appropriately check your blood pressure, make sure you do the following:  1) Avoid caffeine, exercise, or tobacco products for 30 minutes before checking. Empty your bladder. 2) Sit with your back supported in a flat-backed chair. Rest your arm on something flat (arm of the chair, table, etc). 3) Sit still with your feet flat on the floor, resting, for at least 5 minutes.  4) Check your blood pressure. Take 1-2 readings.  5) Write down these readings and bring with you to any provider appointments.  Bring your home blood pressure machine with you to a provider's office for accuracy comparison at least once a year.   Make sure you take your blood pressure medications before you come to any office visit, even if you were asked to fast for labs.   Consider getting an [employer] Series 3 Upper Arm blood pressure machine.   Take care!  Catie Hedwig Morton, PharmD, [place], [facility] [phone]

## 2022-09-05 ENCOUNTER — Other Ambulatory Visit: Payer: Self-pay

## 2022-09-05 ENCOUNTER — Encounter: Payer: Self-pay | Admitting: Nurse Practitioner

## 2022-09-05 ENCOUNTER — Ambulatory Visit (INDEPENDENT_AMBULATORY_CARE_PROVIDER_SITE_OTHER): Payer: BC Managed Care – PPO | Admitting: Nurse Practitioner

## 2022-09-05 VITALS — BP 116/72 | HR 67 | Temp 97.9°F | Wt 217.0 lb

## 2022-09-05 DIAGNOSIS — J4521 Mild intermittent asthma with (acute) exacerbation: Secondary | ICD-10-CM

## 2022-09-05 DIAGNOSIS — E6609 Other obesity due to excess calories: Secondary | ICD-10-CM

## 2022-09-05 DIAGNOSIS — Z6839 Body mass index (BMI) 39.0-39.9, adult: Secondary | ICD-10-CM | POA: Diagnosis not present

## 2022-09-05 MED ORDER — BUDESONIDE-FORMOTEROL FUMARATE 80-4.5 MCG/ACT IN AERO: 2.0000 | INHALATION_SPRAY | Freq: Two times a day (BID) | RESPIRATORY_TRACT | 3 refills | Status: DC

## 2022-09-05 MED ORDER — ALBUTEROL SULFATE HFA 108 (90 BASE) MCG/ACT IN AERS: INHALATION_SPRAY | RESPIRATORY_TRACT | 0 refills | Status: DC

## 2022-09-05 NOTE — Patient Instructions (Signed)
1. Mild intermittent asthma with acute exacerbation  - budesonide-formoterol (SYMBICORT) 80-4.5 MCG/ACT inhaler; Inhale 2 puffs into the lungs 2 (two) times daily.  Dispense: 1 each; Refill: 3 - albuterol (VENTOLIN HFA) 108 (90 Base) MCG/ACT inhaler; INHALE 1 TO 2 PUFFS INTO THE LUNGS EVERY 6 HOURS AS NEEDED FOR WHEEZING OR SHORTNESS OF BREATH  Dispense: 6.7 g; Refill: 0 - For home use only DME Nebulizer machine  2. Class 2 obesity due to excess calories without serious comorbidity with body mass index (BMI) of 39.0 to 39.9 in adult  - Amb Ref to Medical Weight Management   Follow up:  Follow up in 6 months

## 2022-09-05 NOTE — Progress Notes (Signed)
@Patient  ID: , female    DOB: 1970/11/30, 52 y.o.   MRN: [id number]  Chief Complaint  Patient presents with   Hospitalization Follow-up    Referring provider: [id number], NP   HPI  Patient presents today for an ED follow-up.  Patient was seen in the ED on 08/28/2022 for asthma exacerbation.  She was prescribed prednisone taper.  Patient states that she is doing much better.  She states that she does need a new nebulizer machine.  Will place order for this today.  We also discussed that she could start on Symbicort 2 puffs twice daily.  We will order this today.  She can take albuterol as needed.  Patient is also concerned about her weight.  We will place a referral for her for weight management. Denies f/c/s, n/v/d, hemoptysis, PND, leg swelling Denies chest pain or edema      Allergies  Allergen Reactions   Mobic [Meloxicam] Hives    Pt reports she gets real bad hives from Mobic   Shrimp [Shellfish Allergy] Other (See Comments)    Wheezing.  Patient states she is ok with betadine    Immunization History  Administered Date(s) Administered   Influenza Split 04/11/2012   Influenza,inj,Quad PF,6+ Mos 04/30/2015, 06/20/2020   Influenza-Unspecified 06/15/2022   PFIZER(Purple Top)SARS-COV-2 Vaccination 09/28/2019, 10/29/2019   Pneumococcal Polysaccharide-23 04/30/2015   Tdap 02/14/2018    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   Asthma    Bone spur of foot    Hypertension    controlled    Seasonal allergies     Tobacco History: Social History   Tobacco Use  Smoking Status Former   Packs/day: 0.75   Types: Cigarettes   Quit date: 11/17/1988   Years since quitting: 33.8  Smokeless Tobacco Never   Counseling given: Not Answered   Outpatient Encounter Medications as of 09/05/2022  Medication Sig   albuterol (PROVENTIL) (2.5 MG/3ML) 0.083% nebulizer solution Take 3 mLs (2.5 mg total) by nebulization  every 6 (six) hours as needed for wheezing or shortness of breath.   budesonide-formoterol (SYMBICORT) 80-4.5 MCG/ACT inhaler Inhale 2 puffs into the lungs 2 (two) times daily.   cetirizine (ZYRTEC) 10 MG tablet Take 10 mg by mouth daily.   cholecalciferol (VITAMIN D3) 25 MCG (1000 UNIT) tablet Take 1,000 Units by mouth daily.   ondansetron (ZOFRAN-ODT) 4 MG disintegrating tablet 4mg  ODT q4 hours prn nausea/vomit   valsartan-hydrochlorothiazide (DIOVAN-HCT) 160-12.5 MG tablet Take 1 tablet by mouth daily.   zolpidem (AMBIEN) 10 MG tablet TAKE 1 TABLET(10 MG) BY MOUTH AT BEDTIME   [DISCONTINUED] albuterol (VENTOLIN HFA) 108 (90 Base) MCG/ACT inhaler INHALE 1 TO 2 PUFFS INTO THE LUNGS EVERY 6 HOURS AS NEEDED FOR WHEEZING OR SHORTNESS OF BREATH   albuterol (VENTOLIN HFA) 108 (90 Base) MCG/ACT inhaler INHALE 1 TO 2 PUFFS INTO THE LUNGS EVERY 6 HOURS AS NEEDED FOR WHEEZING OR SHORTNESS OF BREATH   dicyclomine (BENTYL) 20 MG tablet Take 1 tablet (20 mg total) by mouth 2 (two) times daily. (Patient not taking: Reported on 09/05/2022)   doxycycline (VIBRAMYCIN) 100 MG capsule Take 100 mg by mouth 2 (two) times daily. (Patient not taking: Reported on 09/05/2022)   hydrOXYzine (ATARAX) 10 MG tablet Take 1 tablet (10 mg total) by mouth 3 (three) times daily as needed.   LINZESS 72 MCG capsule Take 1 capsule (72 mcg total) by mouth daily  before breakfast. (Patient not taking: Reported on 09/05/2022)   morphine (MSIR) 15 MG tablet Take 0.5 tablets (7.5 mg total) by mouth every 4 (four) hours as needed for severe pain. (Patient not taking: Reported on 09/05/2022)   omeprazole (PRILOSEC) 20 MG capsule Take 1 capsule (20 mg total) by mouth daily. (Patient not taking: Reported on 07/12/2022)   omeprazole (PRILOSEC) 20 MG capsule Take 1 capsule (20 mg total) by mouth daily. (Patient not taking: Reported on 09/05/2022)   traMADol (ULTRAM) 50 MG tablet Take 1 tablet (50 mg total) by mouth every 6 (six) hours as needed. (Patient  not taking: Reported on 09/05/2022)   zolpidem (AMBIEN) 10 MG tablet Take 1 tablet (10 mg total) by mouth at bedtime.   No facility-administered encounter medications on file as of 09/05/2022.     Review of Systems  Review of Systems  Constitutional: Negative.   HENT: Negative.    Cardiovascular: Negative.   Gastrointestinal: Negative.   Allergic/Immunologic: Negative.   Neurological: Negative.   Psychiatric/Behavioral: Negative.         Physical Exam  BP 116/72   Pulse 67   Temp 97.9 F (36.6 C)   Wt 217 lb (98.4 kg)   LMP 09/04/2018   SpO2 98%   BMI 39.69 kg/m   Wt Readings from Last 5 Encounters:  09/05/22 217 lb (98.4 kg)  08/28/22 214 lb 15.2 oz (97.5 kg)  08/14/22 214 lb 15.2 oz (97.5 kg)  08/06/22 215 lb (97.5 kg)  07/09/22 190 lb (86.2 kg)     Physical Exam Vitals and nursing note reviewed.  Constitutional:      General: She is not in acute distress.    Appearance: She is well-developed.  Cardiovascular:     Rate and Rhythm: Normal rate and regular rhythm.  Pulmonary:     Effort: Pulmonary effort is normal.     Breath sounds: Normal breath sounds.  Neurological:     Mental Status: She is alert and oriented to person, place, and time.      Lab Results:  CBC    Component Value Date/Time   WBC 4.0 08/14/2022 0614   RBC 4.60 08/14/2022 0614   HGB 13.6 08/14/2022 0614   HGB 12.5 12/10/2019 1051   HCT 41.1 08/14/2022 0614   HCT 37.9 12/10/2019 1051   PLT 179 08/14/2022 0614   PLT 224 12/10/2019 1051   MCV 89.3 08/14/2022 0614   MCV 89 12/10/2019 1051   MCH 29.6 08/14/2022 0614   MCHC 33.1 08/14/2022 0614   RDW 12.8 08/14/2022 0614   RDW 13.4 12/10/2019 1051   LYMPHSABS 1.3 08/14/2022 0614   LYMPHSABS 1.6 12/10/2019 1051   MONOABS 0.5 08/14/2022 0614   EOSABS 0.2 08/14/2022 0614   EOSABS 0.1 12/10/2019 1051   BASOSABS 0.0 08/14/2022 0614   BASOSABS 0.0 12/10/2019 1051    BMET    Component Value Date/Time   NA 139 08/14/2022 0614    NA 141 09/26/2020 1428   K 3.8 08/14/2022 0614   CL 105 08/14/2022 0614   CO2 27 08/14/2022 0614   GLUCOSE 123 (H) 08/14/2022 0614   BUN 13 08/14/2022 0614   BUN 9 09/26/2020 1428   CREATININE 0.62 08/14/2022 0614   CREATININE 0.65 05/02/2017 0958   CALCIUM 8.6 (L) 08/14/2022 0614   GFRNONAA >60 08/14/2022 0614   GFRNONAA 105 05/02/2017 0958   GFRAA 125 12/10/2019 1051   GFRAA 122 05/02/2017 0958    BNP No results found for: "BNP"  ProBNP No results found for: "PROBNP"  Imaging: DG Chest 2 View  Result Date: 08/28/2022 CLINICAL DATA:  Wheezing and difficulty breathing. EXAM: CHEST - 2 VIEW COMPARISON:  June 21, 2020 FINDINGS: The heart size and mediastinal contours are within normal limits. Mildly increased infrahilar lung markings are noted, bilaterally. There is no evidence of focal consolidation, pleural effusion or pneumothorax. Radiopaque surgical clips are seen within the right upper quadrant. The visualized skeletal structures are unremarkable. IMPRESSION: No evidence of acute or active cardiopulmonary disease. Electronically Signed   By: Virgina Norfolk M.D.   On: 08/28/2022 06:47   MR ABDOMEN MRCP W WO CONTAST  Result Date: 08/22/2022 CLINICAL DATA:  Abdominal pain. Mild biliary and pancreatic ductal dilatation on recent CT. Prior cholecystectomy. EXAM: MRI ABDOMEN WITHOUT AND WITH CONTRAST (INCLUDING MRCP) TECHNIQUE: Multiplanar multisequence MR imaging of the abdomen was performed both before and after the administration of intravenous contrast. Heavily T2-weighted images of the biliary and pancreatic ducts were obtained, and three-dimensional MRCP images were rendered by post processing. CONTRAST:  27mL GADAVIST GADOBUTROL 1 MMOL/ML IV SOLN COMPARISON:  CT on 08/04/2022 FINDINGS: Lower chest: No acute findings. Hepatobiliary: Tiny benign cyst noted in the dome of the right hepatic lobe. No hepatic masses identified. Prior cholecystectomy noted. Common bile duct measures  7 mm, which is within normal limits status post cholecystectomy. No evidence of choledocholithiasis or common duct stricture. Pancreas: No mass or inflammatory changes. No evidence of significant pancreatic ductal dilatation or pancreas divisum. Spleen:  Within normal limits in size and appearance. Adrenals/Urinary Tract: No suspicious masses identified. No evidence of hydronephrosis. Stomach/Bowel: Unremarkable. Vascular/Lymphatic: No pathologically enlarged lymph nodes identified. No acute vascular findings. Other:  None. Musculoskeletal:  No suspicious bone lesions identified. IMPRESSION: Prior cholecystectomy. No radiographic evidence of biliary or pancreatic ductal dilatation or obstruction. Electronically Signed   By: Marlaine Hind M.D.   On: 08/22/2022 12:54   MR 3D Recon At Scanner  Result Date: 08/22/2022 CLINICAL DATA:  Abdominal pain. Mild biliary and pancreatic ductal dilatation on recent CT. Prior cholecystectomy. EXAM: MRI ABDOMEN WITHOUT AND WITH CONTRAST (INCLUDING MRCP) TECHNIQUE: Multiplanar multisequence MR imaging of the abdomen was performed both before and after the administration of intravenous contrast. Heavily T2-weighted images of the biliary and pancreatic ducts were obtained, and three-dimensional MRCP images were rendered by post processing. CONTRAST:  61mL GADAVIST GADOBUTROL 1 MMOL/ML IV SOLN COMPARISON:  CT on 08/04/2022 FINDINGS: Lower chest: No acute findings. Hepatobiliary: Tiny benign cyst noted in the dome of the right hepatic lobe. No hepatic masses identified. Prior cholecystectomy noted. Common bile duct measures 7 mm, which is within normal limits status post cholecystectomy. No evidence of choledocholithiasis or common duct stricture. Pancreas: No mass or inflammatory changes. No evidence of significant pancreatic ductal dilatation or pancreas divisum. Spleen:  Within normal limits in size and appearance. Adrenals/Urinary Tract: No suspicious masses identified. No  evidence of hydronephrosis. Stomach/Bowel: Unremarkable. Vascular/Lymphatic: No pathologically enlarged lymph nodes identified. No acute vascular findings. Other:  None. Musculoskeletal:  No suspicious bone lesions identified. IMPRESSION: Prior cholecystectomy. No radiographic evidence of biliary or pancreatic ductal dilatation or obstruction. Electronically Signed   By: Marlaine Hind M.D.   On: 08/22/2022 12:54   CT ABDOMEN PELVIS W CONTRAST  Result Date: 08/14/2022 CLINICAL DATA:  Acute abdominal pain. EXAM: CT ABDOMEN AND PELVIS WITH CONTRAST TECHNIQUE: Multidetector CT imaging of the abdomen and pelvis was performed using the standard protocol following bolus administration of intravenous contrast. RADIATION DOSE REDUCTION: This  exam was performed according to the departmental dose-optimization program which includes automated exposure control, adjustment of the mA and/or kV according to patient size and/or use of iterative reconstruction technique. CONTRAST:  21mL OMNIPAQUE IOHEXOL 300 MG/ML  SOLN COMPARISON:  07/09/2022 FINDINGS: Lower chest: No acute abnormality. Hepatobiliary: Small cyst within dome of liver measures 1 cm. No suspicious liver lesion. Mild intrahepatic bile duct dilatation. Increase caliber of the common bile duct measures up to 1.1 cm, image 60/5. No CT visible stones identified within the CBD. Pancreas: There is mild increase caliber of the main pancreatic duct at the level of the head of pancreas which is new measuring up to 5 mm, image 26/2. At the level of the body and tail of pancreas the main duct is normal. No pancreatic inflammation. Spleen: Normal in size without focal abnormality. Adrenals/Urinary Tract: Normal adrenal glands. No nephrolithiasis, hydronephrosis or kidney mass. Urinary bladder appears normal. Stomach/Bowel: Stomach appears within normal limits. The appendix is visualized and appears normal. No pathologic dilatation of the large or small bowel loops. No  significant bowel wall thickening or inflammation. Vascular/Lymphatic: Normal appearance of the abdominal aorta. No signs of abdominopelvic adenopathy. Reproductive: Uterus and bilateral adnexa are unremarkable. Other: No significant free fluid or fluid collections. Musculoskeletal: No acute or significant osseous findings. IMPRESSION: 1. Status post cholecystectomy. Mild intrahepatic and extrahepatic bile duct dilatation with mild increase caliber of the main pancreatic duct at the level of the head of pancreas. Changes appear new when compared with 07/09/2022. No CT visible stones identified within the CBD. Findings are nonspecific and may be related to underlying stricture at the level of the ampulla. Impacted stone within the distal CBD is also a potential diagnostic consideration. Consider further evaluation with contrast enhanced MRI/MRCP. 2. No signs of acute pancreatitis. Electronically Signed   By: Kerby Moors M.D.   On: 08/14/2022 07:38   US Abdomen Limited RUQ (LIVER/GB)  Result Date: 08/14/2022 CLINICAL DATA:  Right upper quadrant pain for 3 days EXAM: ULTRASOUND ABDOMEN LIMITED RIGHT UPPER QUADRANT COMPARISON:  Abdominal CT 07/09/2022 FINDINGS: Gallbladder: History of cholecystectomy Common bile duct: Diameter: 1 cm.  Where visualized, no filling defect. Liver: No focal lesion identified. Within normal limits in parenchymal echogenicity. Portal vein is patent on color Doppler imaging with normal direction of blood flow towards the liver. IMPRESSION: Mildly dilated common bile duct which may be related to cholecystectomy. No specific cause for symptoms. Electronically Signed   By: Jorje Guild M.D.   On: 08/14/2022 07:07     Assessment & Plan:   Mild intermittent asthma - budesonide-formoterol (SYMBICORT) 80-4.5 MCG/ACT inhaler; Inhale 2 puffs into the lungs 2 (two) times daily.  Dispense: 1 each; Refill: 3 - albuterol (VENTOLIN HFA) 108 (90 Base) MCG/ACT inhaler; INHALE 1 TO 2 PUFFS  INTO THE LUNGS EVERY 6 HOURS AS NEEDED FOR WHEEZING OR SHORTNESS OF BREATH  Dispense: 6.7 g; Refill: 0 - For home use only DME Nebulizer machine  2. Class 2 obesity due to excess calories without serious comorbidity with body mass index (BMI) of 39.0 to 39.9 in adult  - Amb Ref to Medical Weight Management   Follow up:  Follow up in 6 months     Fenton Foy, NP 09/05/2022

## 2022-09-05 NOTE — Assessment & Plan Note (Signed)
-   budesonide-formoterol (SYMBICORT) 80-4.5 MCG/ACT inhaler; Inhale 2 puffs into the lungs 2 (two) times daily.  Dispense: 1 each; Refill: 3 - albuterol (VENTOLIN HFA) 108 (90 Base) MCG/ACT inhaler; INHALE 1 TO 2 PUFFS INTO THE LUNGS EVERY 6 HOURS AS NEEDED FOR WHEEZING OR SHORTNESS OF BREATH  Dispense: 6.7 g; Refill: 0 - For home use only DME Nebulizer machine  2. Class 2 obesity due to excess calories without serious comorbidity with body mass index (BMI) of 39.0 to 39.9 in adult  - Amb Ref to Medical Weight Management   Follow up:  Follow up in 6 months

## 2022-09-06 ENCOUNTER — Other Ambulatory Visit: Payer: Self-pay

## 2022-09-06 ENCOUNTER — Telehealth: Payer: Self-pay

## 2022-09-06 NOTE — Telephone Encounter (Signed)
Prior auth for generic Symbicort has been denied. Per insurance, this medication is non-formulary. I did submit documentation that she has tried Advair and Breo, but insurance still denied. The denial has been uploaded to media.

## 2022-09-24 ENCOUNTER — Encounter (INDEPENDENT_AMBULATORY_CARE_PROVIDER_SITE_OTHER): Payer: BC Managed Care – PPO | Admitting: Family Medicine

## 2022-09-28 ENCOUNTER — Telehealth: Payer: Self-pay | Admitting: Nurse Practitioner

## 2022-09-28 NOTE — Telephone Encounter (Signed)
Pt called requesting someone to please authorize her sleep medicine to be picked up [employer] [facility] [employer]

## 2022-10-01 ENCOUNTER — Other Ambulatory Visit: Payer: Self-pay | Admitting: Nurse Practitioner

## 2022-10-01 NOTE — Telephone Encounter (Signed)
PDMP shows that she just picked up this medication on 09/29/22

## 2022-10-01 NOTE — Telephone Encounter (Signed)
Pt advised that it was filled. [place]

## 2022-10-03 ENCOUNTER — Ambulatory Visit: Payer: BC Managed Care – PPO

## 2022-10-04 ENCOUNTER — Ambulatory Visit: Payer: BC Managed Care – PPO | Admitting: Internal Medicine

## 2022-10-23 ENCOUNTER — Other Ambulatory Visit: Payer: BC Managed Care – PPO | Admitting: Pharmacist

## 2022-10-23 ENCOUNTER — Telehealth: Payer: Self-pay | Admitting: Pharmacist

## 2022-10-23 DIAGNOSIS — J4521 Mild intermittent asthma with (acute) exacerbation: Secondary | ICD-10-CM

## 2022-10-23 NOTE — Progress Notes (Unsigned)
10/23/2022 Name: Wendy James MRN: [id number] DOB: May 17, 1971  Chief Complaint  Patient presents with   Medication Management   Hypertension    Asthma    Wendy James is a 52 y.o. year old female who presented for a telephone visit.   They were referred to the pharmacist by their PCP for assistance in managing hypertension and medication access.    Subjective:  Care Team: Primary Care Provider: Fenton Foy, NP ; Next Scheduled Visit: 10/29/22  Medication Access/Adherence  Current Pharmacy:  [facility] [place], [place] - [address] AT [address] [place] [address] Phone: [phone] Fax: [place] [address] [place] [address] Phone: [phone] Fax: [place] [address] Phone: [phone] Fax: [phone]   Patient reports affordability concerns with their medications: Yes  Patient reports access/transportation concerns to their pharmacy: No  Patient reports adherence concerns with their medications:  No     Hypertension:  Current medications: valsartan/HCTZ 160/12.5 mg daily   Asthma:  Current medications: prescribed Symbicort but noted that her insurance does not cover it  Medications tried in the past: Advair - notes that it may have worsened symptoms, but patient does not remember this; Breo  Appears she was previously prescribed montelukast, but she does not remember this. No documented reason for why it was discontinued.   Reports 1 exacerbations in the past year  Also reports her allergies/congestion is not well controlled right now on cetirizine   Objective:  Lab Results  Component Value Date   HGBA1C 6.1 (H) 12/12/2021    Lab Results  Component Value Date   CREATININE 0.62 08/14/2022   BUN 13  08/14/2022   NA 139 08/14/2022   K 3.8 08/14/2022   CL 105 08/14/2022   CO2 27 08/14/2022    Lab Results  Component Value Date   CHOL 150 04/26/2020   HDL 53.60 04/26/2020   LDLCALC 84 04/26/2020   TRIG 63.0 04/26/2020   CHOLHDL 3 04/26/2020    Medications Reviewed Today     Reviewed by Osker Mason, RPH-CPP (Pharmacist) on 10/23/22 at 1556  Med List Status: <None>   Medication Order Taking? Sig Documenting Provider Last Dose Status Informant  albuterol (PROVENTIL) (2.5 MG/3ML) 0.083% nebulizer solution [id number]  Take 3 mLs (2.5 mg total) by nebulization every 6 (six) hours as needed for wheezing or shortness of breath. Bo Merino I, NP  Active            Med Note Mallie Mussel, [facility]   Wed Sep 05, 2022  1:20 PM) prn  albuterol (VENTOLIN HFA) 108 (90 Base) MCG/ACT inhaler [id number] Yes INHALE 1 TO 2 PUFFS INTO THE LUNGS EVERY 6 HOURS AS NEEDED FOR WHEEZING OR SHORTNESS OF Darlina Sicilian, NP Taking Active   budesonide-formoterol (SYMBICORT) 80-4.5 MCG/ACT inhaler [id number] No Inhale 2 puffs into the lungs 2 (two) times daily.  Patient not taking: Reported on 10/23/2022   Fenton Foy, NP Not Taking Active   cetirizine (ZYRTEC) 10 MG tablet [id number] No Take 10 mg by mouth daily.  Patient not taking: Reported on 10/23/2022   [provider] Not Taking Active            Med Note Lindajo Royal Oct 23, 2022  3:31 PM)  cholecalciferol (VITAMIN D3) 25 MCG (1000 UNIT) tablet [id number]  Take 1,000 Units by mouth daily. [provider]  Active   dicyclomine (BENTYL) 20 MG tablet [id number]  Take 1 tablet (20 mg total) by mouth 2 (two) times daily.  Patient not taking: Reported on 09/05/2022   Veatrice Kells, MD  Active   doxycycline (VIBRAMYCIN) 100 MG capsule [id number]  Take 100 mg by mouth 2 (two) times daily.  Patient not taking: Reported on 09/05/2022   [provider]  Active   hydrOXYzine (ATARAX) 10 MG tablet [id number]   Take 1 tablet (10 mg total) by mouth 3 (three) times daily as needed. Fenton Foy, NP  Active            Med Note Elyse Jarvis   Wed Sep 05, 2022  1:21 PM) Reuel Derby 72 MCG capsule [id number]  Take 1 capsule (72 mcg total) by mouth daily before breakfast.  Patient not taking: Reported on 09/05/2022   Vevelyn Francois, NP  Active   morphine (MSIR) 15 MG tablet [id number]  Take 0.5 tablets (7.5 mg total) by mouth every 4 (four) hours as needed for severe pain.  Patient not taking: Reported on 09/05/2022   Deno Etienne, DO  Active   omeprazole (PRILOSEC) 20 MG capsule [id number]  Take 1 capsule (20 mg total) by mouth daily.  Patient not taking: Reported on 07/12/2022   Palumbo, April, MD  Active   omeprazole (PRILOSEC) 20 MG capsule [id number]  Take 1 capsule (20 mg total) by mouth daily.  Patient not taking: Reported on 09/05/2022   Veatrice Kells, MD  Active   ondansetron (ZOFRAN-ODT) 4 MG disintegrating tablet [id number]  4mg  ODT q4 hours prn nausea/vomit Deno Etienne, DO  Active            Med Note Mallie Mussel, [facility]   Wed Sep 05, 2022  1:21 PM) Prn   traMADol (ULTRAM) 50 MG tablet [id number]  Take 1 tablet (50 mg total) by mouth every 6 (six) hours as needed.  Patient not taking: Reported on 09/05/2022   Veatrice Kells, MD  Active   valsartan-hydrochlorothiazide (DIOVAN-HCT) 160-12.5 MG tablet [id number] Yes Take 1 tablet by mouth daily. Fenton Foy, NP Taking Active   zolpidem (AMBIEN) 10 MG tablet [id number]  TAKE 1 TABLET(10 MG) BY MOUTH AT BEDTIME Fenton Foy, NP  Active   zolpidem (AMBIEN) 10 MG tablet [id number]  Take 1 tablet (10 mg total) by mouth at bedtime. Dorena Dew, FNP  Active               Assessment/Plan:   Hypertension: - Currently controlled - Recommend to continue current regimen    Asthma: - Currently uncontrolled.  - Per EMR, Advair may be preferred ICS/LABA. Recommend to order Advair 250/50 mcg twice daily. Refill albuterol HFA. Recommend to  initiate trial of montelukast. Discussed with PCP   Follow Up Plan: PCP in 1 week  Catie Hedwig Morton, PharmD, [place], [facility] [phone]

## 2022-10-23 NOTE — Progress Notes (Signed)
error 

## 2022-10-24 MED ORDER — MONTELUKAST SODIUM 10 MG PO TABS: 10.0000 mg | ORAL_TABLET | Freq: Every day | ORAL | 2 refills | Status: DC

## 2022-10-24 MED ORDER — FLUTICASONE-SALMETEROL 250-50 MCG/ACT IN AEPB: 1.0000 | INHALATION_SPRAY | Freq: Two times a day (BID) | RESPIRATORY_TRACT | 1 refills | Status: AC

## 2022-10-24 MED ORDER — ALBUTEROL SULFATE HFA 108 (90 BASE) MCG/ACT IN AERS: INHALATION_SPRAY | RESPIRATORY_TRACT | 2 refills | Status: DC

## 2022-10-29 ENCOUNTER — Ambulatory Visit: Payer: Self-pay | Admitting: Nurse Practitioner

## 2022-11-15 ENCOUNTER — Ambulatory Visit: Payer: Self-pay | Admitting: Nurse Practitioner

## 2022-12-04 ENCOUNTER — Ambulatory Visit (HOSPITAL_COMMUNITY): Payer: BC Managed Care – PPO

## 2022-12-04 ENCOUNTER — Ambulatory Visit
Admission: EM | Admit: 2022-12-04 | Discharge: 2022-12-04 | Discharge status: Against Medical Advice | Payer: BC Managed Care – PPO

## 2022-12-12 ENCOUNTER — Other Ambulatory Visit: Payer: Self-pay

## 2022-12-12 ENCOUNTER — Encounter (HOSPITAL_BASED_OUTPATIENT_CLINIC_OR_DEPARTMENT_OTHER): Payer: Self-pay | Admitting: Emergency Medicine

## 2022-12-12 ENCOUNTER — Emergency Department (HOSPITAL_BASED_OUTPATIENT_CLINIC_OR_DEPARTMENT_OTHER)
Admission: EM | Admit: 2022-12-12 | Discharge: 2022-12-12 | Disposition: A | Discharge status: Home / Self Care | Payer: BC Managed Care – PPO | Attending: Emergency Medicine | Admitting: Emergency Medicine

## 2022-12-12 DIAGNOSIS — Z79899 Other long term (current) drug therapy: Secondary | ICD-10-CM | POA: Insufficient documentation

## 2022-12-12 DIAGNOSIS — M545 Low back pain, unspecified: Secondary | ICD-10-CM | POA: Diagnosis present

## 2022-12-12 DIAGNOSIS — R030 Elevated blood-pressure reading, without diagnosis of hypertension: Secondary | ICD-10-CM

## 2022-12-12 DIAGNOSIS — X500XXA Overexertion from strenuous movement or load, initial encounter: Secondary | ICD-10-CM | POA: Insufficient documentation

## 2022-12-12 DIAGNOSIS — J45909 Unspecified asthma, uncomplicated: Secondary | ICD-10-CM | POA: Insufficient documentation

## 2022-12-12 DIAGNOSIS — I10 Essential (primary) hypertension: Secondary | ICD-10-CM | POA: Insufficient documentation

## 2022-12-12 MED ORDER — METHOCARBAMOL 500 MG PO TABS: 500.0000 mg | ORAL_TABLET | Freq: Two times a day (BID) | ORAL | 0 refills | Status: AC

## 2022-12-12 NOTE — ED Notes (Signed)
Discharge paperwork given and verbally understood. 

## 2022-12-12 NOTE — ED Triage Notes (Signed)
Pt arrives to ED with c/o left lower back pain with radiation down her left leg that started x5 days ago. Was seen at [facility] on 5/7 for same.

## 2022-12-12 NOTE — Discharge Instructions (Addendum)
Your back pain is most likely due to a muscular strain.  There is been a lot of research on back pain, unfortunately the only thing that seems to really help is Tylenol and ibuprofen.  Relative rest is also important to not lift greater than 10 pounds bending or twisting at the waist.  Please follow-up with your family physician.  The other thing that really seems to benefit patients is physical therapy which your doctor may send you for.  Please return to the emergency department for new numbness or weakness to your arms or legs. Difficulty with urinating or urinating or pooping on yourself.  Also if you cannot feel toilet paper when you wipe or get a fever.   Take 4 over the counter ibuprofen tablets 3 times a day or 2 over-the-counter naproxen tablets twice a day for pain. Also take tylenol 1000mg (2 extra strength) four times a day.   I have also given you a prescription for Robaxin which is a muscle relaxer for you to take as prescribed as needed.  Do not drive or operate heavy machinery while taking this medication.  Also, your blood pressure is high today.  Please be sure you are taking your blood pressure medication as prescribed.  Please take this medication when you get home today.

## 2022-12-12 NOTE — ED Provider Notes (Signed)
[place] EMERGENCY DEPARTMENT AT [facility] Provider Note   CSN: [id number] Arrival date & time: 12/12/22  1745     History  Chief Complaint  Patient presents with   Back Pain    Wendy James is a 52 y.o. female.  Patient with history of asthma, hypertension, seasonal allergies presents today with complaints of back pain.  She states that same began approximately 2 weeks ago and has been persistent since then.  She denies any trauma or heavy lifting.  The pain is left-sided in nature and does radiate into her left buttock.  She denies any history of similar symptoms previously.  She went to urgent care a few days ago and had an x-ray that was unremarkable and was given gabapentin with concern for neuropathic pain.  She has been taking the gabapentin as prescribed without improvement.  She denies fevers or chills, no loss of bowel or bladder function.  No nausea, vomiting, or diarrhea.  No urinary symptoms.  She does note that she did have a history of long-term steroid use for her asthma, however this was many years ago.  Denies any history of malignancy.  Denies any sharp shooting pain in her legs or numbness/tingling in her extremities.  She is able to walk with some pain.  The history is provided by the patient. No language interpreter was used.  Back Pain      Home Medications Prior to Admission medications   Medication Sig Start Date End Date Taking? Authorizing Provider  albuterol (PROVENTIL) (2.5 MG/3ML) 0.083% nebulizer solution Take 3 mLs (2.5 mg total) by nebulization every 6 (six) hours as needed for wheezing or shortness of breath. 11/01/21   Passmore, Enid Derry I, NP  albuterol (VENTOLIN HFA) 108 (90 Base) MCG/ACT inhaler INHALE 1 TO 2 PUFFS INTO THE LUNGS EVERY 6 HOURS AS NEEDED FOR WHEEZING OR SHORTNESS OF BREATH 10/24/22   Ivonne Andrew, NP  cetirizine (ZYRTEC) 10 MG tablet Take 10 mg by mouth daily. Patient not taking: Reported on 10/23/2022 07/09/22    [provider]  cholecalciferol (VITAMIN D3) 25 MCG (1000 UNIT) tablet Take 1,000 Units by mouth daily.    [provider]  dicyclomine (BENTYL) 20 MG tablet Take 1 tablet (20 mg total) by mouth 2 (two) times daily. Patient not taking: Reported on 09/05/2022 08/14/22   Palumbo, April, MD  doxycycline (VIBRAMYCIN) 100 MG capsule Take 100 mg by mouth 2 (two) times daily. Patient not taking: Reported on 09/05/2022 07/25/22   [provider]  fluticasone-salmeterol (ADVAIR) 250-50 MCG/ACT AEPB Inhale 1 puff into the lungs in the morning and at bedtime. 10/24/22   Ivonne Andrew, NP  hydrOXYzine (ATARAX) 10 MG tablet Take 1 tablet (10 mg total) by mouth 3 (three) times daily as needed. 03/21/22   Ivonne Andrew, NP  LINZESS 72 MCG capsule Take 1 capsule (72 mcg total) by mouth daily before breakfast. Patient not taking: Reported on 09/05/2022 09/07/21   Barbette Merino, NP  montelukast (SINGULAIR) 10 MG tablet Take 1 tablet (10 mg total) by mouth at bedtime. 10/24/22   Ivonne Andrew, NP  morphine (MSIR) 15 MG tablet Take 0.5 tablets (7.5 mg total) by mouth every 4 (four) hours as needed for severe pain. Patient not taking: Reported on 09/05/2022 08/14/22   Melene Plan, DO  omeprazole (PRILOSEC) 20 MG capsule Take 1 capsule (20 mg total) by mouth daily. Patient not taking: Reported on 07/12/2022 07/10/22   Nicanor Alcon, April, MD  omeprazole (  PRILOSEC) 20 MG capsule Take 1 capsule (20 mg total) by mouth daily. Patient not taking: Reported on 09/05/2022 08/14/22   Palumbo, April, MD  ondansetron (ZOFRAN-ODT) 4 MG disintegrating tablet 4mg  ODT q4 hours prn nausea/vomit 08/14/22   Melene Plan, DO  traMADol (ULTRAM) 50 MG tablet Take 1 tablet (50 mg total) by mouth every 6 (six) hours as needed. Patient not taking: Reported on 09/05/2022 07/10/22   Palumbo, April, MD  valsartan-hydrochlorothiazide (DIOVAN-HCT) 160-12.5 MG tablet Take 1 tablet by mouth daily. 04/12/22   Ivonne Andrew, NP  zolpidem  (AMBIEN) 10 MG tablet TAKE 1 TABLET(10 MG) BY MOUTH AT BEDTIME 08/27/22   Ivonne Andrew, NP  zolpidem (AMBIEN) 10 MG tablet Take 1 tablet (10 mg total) by mouth at bedtime. 08/28/22   Massie Maroon, FNP      Allergies    Mobic [meloxicam] and Shrimp [shellfish allergy]    Review of Systems   Review of Systems  Musculoskeletal:  Positive for back pain.  All other systems reviewed and are negative.   Physical Exam Updated Vital Signs BP (!) 171/101 (BP Location: Right Arm)   Pulse (!) 58   Temp 98.2 F (36.8 C) (Oral)   Resp 18   Ht 5\' 2"  (1.575 m)   Wt 95.3 kg   LMP 09/04/2018   SpO2 97%   BMI 38.41 kg/m  Physical Exam Vitals and nursing note reviewed.  Constitutional:      General: She is not in acute distress.    Appearance: Normal appearance. She is normal weight. She is not ill-appearing, toxic-appearing or diaphoretic.  HENT:     Head: Normocephalic and atraumatic.  Cardiovascular:     Rate and Rhythm: Normal rate and regular rhythm.     Heart sounds: Normal heart sounds.  Pulmonary:     Effort: Pulmonary effort is normal. No respiratory distress.     Breath sounds: Normal breath sounds.  Abdominal:     General: Abdomen is flat.     Palpations: Abdomen is soft.     Tenderness: There is no abdominal tenderness.  Musculoskeletal:        General: Normal range of motion.     Cervical back: Normal range of motion.     Comments: No midline tenderness to palpation of the cervical, thoracic, or lumbar spine.  No step-offs, lesions, deformity, or overlying skin changes.  Palpable paraspinous muscle tenderness and tightness noted to the left lower lumbar spine and into the left buttock.  Negative straight leg raise.  DP and PT pulses intact and 2+.  Patient ambulatory with steady gait.  Skin:    General: Skin is warm and dry.  Neurological:     General: No focal deficit present.     Mental Status: She is alert.  Psychiatric:        Mood and Affect: Mood normal.         Behavior: Behavior normal.     ED Results / Procedures / Treatments   Labs (all labs ordered are listed, but only abnormal results are displayed) Labs Reviewed - No data to display  EKG None  Radiology No results found.  Procedures Procedures    Medications Ordered in ED Medications - No data to display  ED Course/ Medical Decision Making/ A&P                             Medical Decision Making  Patient presents  today with complaints of left-sided low back pain.  She is afebrile, nontoxic-appearing, and in no acute distress with reassuring vital signs.  She is notably hypertensive, however she has not taken her blood pressure medication today.  Physical exam reveals no neurological deficits and normal neuro exam.  Palpable paraspinous muscle tenderness on the left side.  No abdominal tenderness, good pulses throughout.  No numbness/tingling.  No symptoms to suggest dissection or acute intra-abdominal pathology.  She has no urinary symptoms.  Pain is worse with movement.  Patient can walk but states is painful.  No loss of bowel or bladder control.  No concern for cauda equina.  No fever, night sweats, weight loss, h/o cancer, IVDU.  RICE protocol and pain medicine indicated and discussed with patient.  Will also send for Robaxin for additional symptoms and recommend close PCP follow-up.  She states that she has an appointment tomorrow.  I have advised patient not to drive or operate heavy machinery while taking Robaxin.  Additionally, patient's blood pressure is notably high today, however she does state that she has not taken her blood pressure medication today.  Recommend that she take her medication as soon as she gets home today.  Given normal x-ray urgent care few days ago with no red flag indication for additional imaging or laboratory evaluation at this time. Evaluation and diagnostic testing in the emergency department does not suggest an emergent condition requiring  admission or immediate intervention beyond what has been performed at this time.  Plan for discharge with close PCP follow-up.  Patient is understanding and amenable with plan, educated on red flag symptoms that would prompt immediate return.  Patient discharged in stable condition.   Final Clinical Impression(s) / ED Diagnoses Final diagnoses:  Acute left-sided low back pain without sciatica  Elevated blood pressure reading    Rx / DC Orders ED Discharge Orders          Ordered    methocarbamol (ROBAXIN) 500 MG tablet  2 times daily        12/12/22 1926          An After Visit Summary was printed and given to the patient.     Vear Clock 12/12/22 Lelon Huh, MD 12/12/22 [phone]

## 2022-12-13 ENCOUNTER — Telehealth: Payer: Self-pay

## 2022-12-13 ENCOUNTER — Other Ambulatory Visit: Payer: Self-pay

## 2022-12-13 MED ORDER — MONTELUKAST SODIUM 10 MG PO TABS: 10.0000 mg | ORAL_TABLET | Freq: Every day | ORAL | 2 refills | Status: AC

## 2022-12-13 NOTE — Transitions of Care (Post Inpatient/ED Visit) (Cosign Needed)
12/13/2022  Name: Wendy James MRN: [id number] DOB: 08-06-1970  Today's TOC FU Call Status: Today's TOC FU Call Status:: Successful TOC FU Call Competed TOC FU Call Complete Date: 12/13/22  Transition Care Management Follow-up Telephone Call Date of Discharge: 12/12/22 Discharge Facility: [facility] ([facility]) Type of Discharge: Emergency Department Reason for ED Visit: Orthopedic Conditions Orthopedic/Injury Diagnosis: Sprain or Strain How have you been since you were released from the hospital?: Better Any questions or concerns?: No Patient Questions/Concerns:: N/A  Items Reviewed: Did you receive and understand the discharge instructions provided?: Yes Medications obtained,verified, and reconciled?: Yes (Medications Reviewed) Dietary orders reviewed?: No Do you have support at home?: No  Medications Reviewed Today: Medications Reviewed Today     Reviewed by Renelda Loma, RMA (Registered Medical Assistant) on 12/13/22 at 1211  Med List Status: <None>   Medication Order Taking? Sig Documenting Provider Last Dose Status Informant  albuterol (PROVENTIL) (2.5 MG/3ML) 0.083% nebulizer solution [id number] Yes Take 3 mLs (2.5 mg total) by nebulization every 6 (six) hours as needed for wheezing or shortness of breath. Orion Crook I, NP Taking Active            Med Note Sherilyn Cooter, [facility]   Wed Sep 05, 2022  1:20 PM) prn  albuterol (VENTOLIN HFA) 108 (90 Base) MCG/ACT inhaler [id number] Yes INHALE 1 TO 2 PUFFS INTO THE LUNGS EVERY 6 HOURS AS NEEDED FOR WHEEZING OR SHORTNESS OF Royann Shivers, NP Taking Active   cetirizine (ZYRTEC) 10 MG tablet [id number] Yes Take 10 mg by mouth daily. [provider] Taking Active            Med Note Clearance Coots, Janace Litten   Tue Oct 23, 2022  3:31 PM)    cholecalciferol (VITAMIN D3) 25 MCG (1000 UNIT) tablet [id number] Yes Take 1,000 Units by mouth daily. [provider] Taking Active   dicyclomine (BENTYL) 20 MG  tablet [id number] No Take 1 tablet (20 mg total) by mouth 2 (two) times daily.  Patient not taking: Reported on 09/05/2022   Cy Blamer, MD Not Taking Active   doxycycline (VIBRAMYCIN) 100 MG capsule [id number] No Take 100 mg by mouth 2 (two) times daily.  Patient not taking: Reported on 09/05/2022   [provider] Not Taking Active   fluticasone-salmeterol (ADVAIR) 250-50 MCG/ACT AEPB [id number] Yes Inhale 1 puff into the lungs in the morning and at bedtime. Ivonne Andrew, NP Taking Active   hydrOXYzine (ATARAX) 10 MG tablet [id number] Yes Take 1 tablet (10 mg total) by mouth 3 (three) times daily as needed. Ivonne Andrew, NP Taking Active            Med Note Renelda Loma   Wed Sep 05, 2022  1:21 PM) Vinnie Langton 72 MCG capsule [id number] No Take 1 capsule (72 mcg total) by mouth daily before breakfast.  Patient not taking: Reported on 09/05/2022   Barbette Merino, NP Not Taking Active   methocarbamol (ROBAXIN) 500 MG tablet [id number] Yes Take 1 tablet (500 mg total) by mouth 2 (two) times daily. Smoot, Shawn Route, PA-C Taking Active   montelukast (SINGULAIR) 10 MG tablet [id number] Yes Take 1 tablet (10 mg total) by mouth at bedtime. Ivonne Andrew, NP Taking Active   morphine (MSIR) 15 MG tablet [id number] No Take 0.5 tablets (7.5 mg total) by mouth every 4 (four) hours as needed for severe pain.  Patient not taking: Reported on 09/05/2022   Melene Plan, DO Not Taking Active  omeprazole (PRILOSEC) 20 MG capsule [id number] No Take 1 capsule (20 mg total) by mouth daily.  Patient not taking: Reported on 07/12/2022   Palumbo, April, MD Not Taking Active   omeprazole (PRILOSEC) 20 MG capsule [id number] No Take 1 capsule (20 mg total) by mouth daily.  Patient not taking: Reported on 09/05/2022   Cy Blamer, MD Not Taking Active   ondansetron (ZOFRAN-ODT) 4 MG disintegrating tablet [id number] No 4mg  ODT q4 hours prn nausea/vomit  Patient not taking: Reported on 12/13/2022   Melene Plan,  DO Not Taking Active            Med Note Sherilyn Cooter, [facility]   Wed Sep 05, 2022  1:21 PM) Prn   traMADol (ULTRAM) 50 MG tablet [id number] No Take 1 tablet (50 mg total) by mouth every 6 (six) hours as needed.  Patient not taking: Reported on 09/05/2022   Cy Blamer, MD Not Taking Active   valsartan-hydrochlorothiazide (DIOVAN-HCT) 160-12.5 MG tablet [id number] Yes Take 1 tablet by mouth daily. Ivonne Andrew, NP Taking Active   zolpidem (AMBIEN) 10 MG tablet [id number] Yes TAKE 1 TABLET(10 MG) BY MOUTH AT BEDTIME Ivonne Andrew, NP Taking Active   zolpidem (AMBIEN) 10 MG tablet [id number] Yes Take 1 tablet (10 mg total) by mouth at bedtime. Massie Maroon, FNP Taking Active             Home Care and Equipment/Supplies: Were Home Health Services Ordered?: No Any new equipment or medical supplies ordered?: No  Functional Questionnaire: Do you need assistance with bathing/showering or dressing?: No Do you need assistance with meal preparation?: No Do you need assistance with eating?: No Do you have difficulty maintaining continence: No Do you need assistance with getting out of bed/getting out of a chair/moving?: No Do you have difficulty managing or taking your medications?: No  Follow up appointments reviewed: PCP Follow-up appointment confirmed?: Yes Date of PCP follow-up appointment?: 12/26/22 Follow-up Provider: Angus Seller Specialist [facility] Follow-up appointment confirmed?: NA Do you need transportation to your follow-up appointment?: No Do you understand care options if your condition(s) worsen?: Yes-patient verbalized understanding    SIGNATURE Renelda Loma RMA

## 2022-12-26 ENCOUNTER — Inpatient Hospital Stay: Payer: Self-pay | Admitting: Nurse Practitioner

## 2023-01-03 ENCOUNTER — Other Ambulatory Visit: Payer: Self-pay

## 2023-01-03 DIAGNOSIS — G4700 Insomnia, unspecified: Secondary | ICD-10-CM

## 2023-01-03 MED ORDER — ZOLPIDEM TARTRATE 10 MG PO TABS: 10.0000 mg | ORAL_TABLET | Freq: Every day | ORAL | 0 refills | Status: DC

## 2023-01-03 NOTE — Telephone Encounter (Signed)
Please advise Kh 

## 2023-01-22 ENCOUNTER — Other Ambulatory Visit: Payer: Self-pay

## 2023-01-22 ENCOUNTER — Telehealth: Payer: Self-pay | Admitting: Nurse Practitioner

## 2023-01-22 ENCOUNTER — Emergency Department (HOSPITAL_COMMUNITY)
Admission: EM | Admit: 2023-01-22 | Discharge: 2023-01-22 | Disposition: A | Discharge status: Home / Self Care | Payer: BC Managed Care – PPO | Attending: Emergency Medicine | Admitting: Emergency Medicine

## 2023-01-22 ENCOUNTER — Emergency Department (HOSPITAL_COMMUNITY): Payer: BC Managed Care – PPO

## 2023-01-22 DIAGNOSIS — Z7951 Long term (current) use of inhaled steroids: Secondary | ICD-10-CM | POA: Insufficient documentation

## 2023-01-22 DIAGNOSIS — J45901 Unspecified asthma with (acute) exacerbation: Secondary | ICD-10-CM | POA: Diagnosis not present

## 2023-01-22 DIAGNOSIS — R0602 Shortness of breath: Secondary | ICD-10-CM | POA: Diagnosis present

## 2023-01-22 DIAGNOSIS — J4521 Mild intermittent asthma with (acute) exacerbation: Secondary | ICD-10-CM

## 2023-01-22 LAB — CBC WITH DIFFERENTIAL/PLATELET
Abs Immature Granulocytes: 0.01 10*3/uL (ref 0.00–0.07)
Basophils Absolute: 0 10*3/uL (ref 0.0–0.1)
Basophils Relative: 1 %
Eosinophils Absolute: 0.2 10*3/uL (ref 0.0–0.5)
Eosinophils Relative: 3 %
HCT: 38 % (ref 36.0–46.0)
Hemoglobin: 12.6 g/dL (ref 12.0–15.0)
Immature Granulocytes: 0 %
Lymphocytes Relative: 34 %
Lymphs Abs: 1.8 10*3/uL (ref 0.7–4.0)
MCH: 30.3 pg (ref 26.0–34.0)
MCHC: 33.2 g/dL (ref 30.0–36.0)
MCV: 91.3 fL (ref 80.0–100.0)
Monocytes Absolute: 0.3 10*3/uL (ref 0.1–1.0)
Monocytes Relative: 6 %
Neutro Abs: 3 10*3/uL (ref 1.7–7.7)
Neutrophils Relative %: 56 %
Platelets: 189 10*3/uL (ref 150–400)
RBC: 4.16 MIL/uL (ref 3.87–5.11)
RDW: 12.9 % (ref 11.5–15.5)
WBC: 5.4 10*3/uL (ref 4.0–10.5)
nRBC: 0 % (ref 0.0–0.2)

## 2023-01-22 LAB — COMPREHENSIVE METABOLIC PANEL
ALT: 37 U/L (ref 0–44)
AST: 29 U/L (ref 15–41)
Albumin: 3.9 g/dL (ref 3.5–5.0)
Alkaline Phosphatase: 71 U/L (ref 38–126)
Anion gap: 7 (ref 5–15)
BUN: 17 mg/dL (ref 6–20)
CO2: 26 mmol/L (ref 22–32)
Calcium: 8.3 mg/dL — ABNORMAL LOW (ref 8.9–10.3)
Chloride: 105 mmol/L (ref 98–111)
Creatinine, Ser: 0.63 mg/dL (ref 0.44–1.00)
GFR, Estimated: >60 mL/min (ref >60)
Glucose, Bld: 122 mg/dL — ABNORMAL HIGH (ref 70–99)
Potassium: 3.3 mmol/L — ABNORMAL LOW (ref 3.5–5.1)
Sodium: 138 mmol/L (ref 135–145)
Total Bilirubin: 1.2 mg/dL (ref 0.3–1.2)
Total Protein: 7.3 g/dL (ref 6.5–8.1)

## 2023-01-22 MED ORDER — PREDNISONE 20 MG PO TABS: 40.0000 mg | ORAL_TABLET | Freq: Every day | ORAL | 0 refills | Status: AC

## 2023-01-22 MED ORDER — POTASSIUM CHLORIDE CRYS ER 10 MEQ PO TBCR
10.0000 meq | EXTENDED_RELEASE_TABLET | Freq: Once | ORAL | Status: AC
  Administered 2023-01-22: 10 meq via ORAL
  Filled 2023-01-22: qty 1

## 2023-01-22 MED ORDER — ALBUTEROL SULFATE (2.5 MG/3ML) 0.083% IN NEBU: 2.5000 mg | INHALATION_SOLUTION | Freq: Four times a day (QID) | RESPIRATORY_TRACT | 1 refills | Status: AC | PRN

## 2023-01-22 MED ORDER — ALBUTEROL SULFATE HFA 108 (90 BASE) MCG/ACT IN AERS
1.0000 | INHALATION_SPRAY | Freq: Once | RESPIRATORY_TRACT | Status: AC
  Administered 2023-01-22: 2 via RESPIRATORY_TRACT
  Filled 2023-01-22: qty 6.7

## 2023-01-22 MED ORDER — ALBUTEROL SULFATE HFA 108 (90 BASE) MCG/ACT IN AERS: INHALATION_SPRAY | RESPIRATORY_TRACT | 2 refills | Status: DC

## 2023-01-22 NOTE — Telephone Encounter (Signed)
Done KH 

## 2023-01-22 NOTE — Telephone Encounter (Signed)
Caller & Relationship to patient:  MRN #  [id number]   Call Back Number:   Date of Last Office Visit: 01/03/2023     Date of Next Office Visit: Visit date not found    Medication(s) to be Refilled: Cough med and albuterol  Preferred Pharmacy:   ** Please notify patient to allow 48-72 hours to process** **Let patient know to contact pharmacy at the end of the day to make sure medication is ready. ** **If patient has not been seen in a year or longer, book an appointment **Advise to use MyChart for refill requests OR to contact their pharmacy

## 2023-01-22 NOTE — ED Triage Notes (Signed)
Pt from work via EMS c/o SOB r/t uncontrolled asthma. Pt's inhaler is out. EMS reports wheezing but stable room air sats. Pt in NAD. EMS provided x2 breathing tx, 2g IV mag, and 125mg  solumedrol IV pta.

## 2023-01-22 NOTE — ED Provider Notes (Signed)
[place] EMERGENCY DEPARTMENT AT [facility] Provider Note   CSN: [id number] Arrival date & time: 01/22/23  1428     History  Chief Complaint  Patient presents with   Shortness of Breath    Wendy James is a 52 y.o. female with a history of asthma who presents to the ED via EMS for shortness of breath. Patient reports about an hour prior to arrival she became short of breath and started wheezing at work. She states that she has a history of asthma flares when it is hot outside. Patient reports 2 nebulizer treatments and using her Albuterol inhaler this morning at home prior to work. She reports that she ran out of her Albuterol inhaler that she keeps at work. Patient denies chest pain or palpations. No other complaints or concerns at this time.    Home Medications Prior to Admission medications   Medication Sig Start Date End Date Taking? Authorizing Provider  predniSONE (DELTASONE) 20 MG tablet Take 2 tablets (40 mg total) by mouth daily for 4 days. Take 40 mg (2 tablets) once a day for 4 days 01/22/23 01/26/23 Yes Maxwell Marion, PA-C  albuterol (PROVENTIL) (2.5 MG/3ML) 0.083% nebulizer solution Take 3 mLs (2.5 mg total) by nebulization every 6 (six) hours as needed for wheezing or shortness of breath. 01/22/23   Ivonne Andrew, NP  albuterol (VENTOLIN HFA) 108 (90 Base) MCG/ACT inhaler INHALE 1 TO 2 PUFFS INTO THE LUNGS EVERY 6 HOURS AS NEEDED FOR WHEEZING OR SHORTNESS OF BREATH 01/22/23   Ivonne Andrew, NP  cetirizine (ZYRTEC) 10 MG tablet Take 10 mg by mouth daily. 07/09/22   [provider]  cholecalciferol (VITAMIN D3) 25 MCG (1000 UNIT) tablet Take 1,000 Units by mouth daily.    [provider]  dicyclomine (BENTYL) 20 MG tablet Take 1 tablet (20 mg total) by mouth 2 (two) times daily. Patient not taking: Reported on 09/05/2022 08/14/22   Palumbo, April, MD  doxycycline (VIBRAMYCIN) 100 MG capsule Take 100 mg by mouth 2 (two) times daily. Patient  not taking: Reported on 09/05/2022 07/25/22   [provider]  fluticasone-salmeterol (ADVAIR) 250-50 MCG/ACT AEPB Inhale 1 puff into the lungs in the morning and at bedtime. 10/24/22   Ivonne Andrew, NP  hydrOXYzine (ATARAX) 10 MG tablet Take 1 tablet (10 mg total) by mouth 3 (three) times daily as needed. 03/21/22   Ivonne Andrew, NP  LINZESS 72 MCG capsule Take 1 capsule (72 mcg total) by mouth daily before breakfast. Patient not taking: Reported on 09/05/2022 09/07/21   Barbette Merino, NP  methocarbamol (ROBAXIN) 500 MG tablet Take 1 tablet (500 mg total) by mouth 2 (two) times daily. 12/12/22   Smoot, Sarah A, PA-C  montelukast (SINGULAIR) 10 MG tablet Take 1 tablet (10 mg total) by mouth at bedtime. 12/13/22   Ivonne Andrew, NP  morphine (MSIR) 15 MG tablet Take 0.5 tablets (7.5 mg total) by mouth every 4 (four) hours as needed for severe pain. Patient not taking: Reported on 09/05/2022 08/14/22   Melene Plan, DO  omeprazole (PRILOSEC) 20 MG capsule Take 1 capsule (20 mg total) by mouth daily. Patient not taking: Reported on 07/12/2022 07/10/22   Palumbo, April, MD  omeprazole (PRILOSEC) 20 MG capsule Take 1 capsule (20 mg total) by mouth daily. Patient not taking: Reported on 09/05/2022 08/14/22   Palumbo, April, MD  ondansetron (ZOFRAN-ODT) 4 MG disintegrating tablet 4mg  ODT q4 hours prn nausea/vomit Patient not taking: Reported  on 12/13/2022 08/14/22   Melene Plan, DO  traMADol (ULTRAM) 50 MG tablet Take 1 tablet (50 mg total) by mouth every 6 (six) hours as needed. Patient not taking: Reported on 09/05/2022 07/10/22   Palumbo, April, MD  valsartan-hydrochlorothiazide (DIOVAN-HCT) 160-12.5 MG tablet Take 1 tablet by mouth daily. 04/12/22   Ivonne Andrew, NP  zolpidem (AMBIEN) 10 MG tablet TAKE 1 TABLET(10 MG) BY MOUTH AT BEDTIME 08/27/22   Ivonne Andrew, NP  zolpidem (AMBIEN) 10 MG tablet Take 1 tablet (10 mg total) by mouth at bedtime. 01/03/23   Ivonne Andrew, NP      Allergies     Mobic [meloxicam] and Shrimp [shellfish allergy]    Review of Systems   Review of Systems  Respiratory:  Positive for shortness of breath.   All other systems reviewed and are negative.   Physical Exam Updated Vital Signs BP (!) 161/97   Pulse 76   Temp 98.5 F (36.9 C) (Oral)   Resp 15   Ht 5\' 2"  (1.575 m)   Wt 96.2 kg   LMP 09/04/2018   SpO2 97%   BMI 38.78 kg/m  Physical Exam Vitals and nursing note reviewed.  Constitutional:      General: She is not in acute distress.    Appearance: Normal appearance. She is not ill-appearing or toxic-appearing.     Comments: No acute distress upon evaluation  HENT:     Head: Normocephalic and atraumatic.     Mouth/Throat:     Mouth: Mucous membranes are moist.  Eyes:     Conjunctiva/sclera: Conjunctivae normal.     Pupils: Pupils are equal, round, and reactive to light.  Cardiovascular:     Rate and Rhythm: Normal rate and regular rhythm.     Pulses: Normal pulses.  Pulmonary:     Effort: Pulmonary effort is normal.     Breath sounds: Wheezing present.     Comments: Some wheezing in the left lower lobe Abdominal:     Palpations: Abdomen is soft.     Tenderness: There is no abdominal tenderness.  Musculoskeletal:        General: Normal range of motion.     Right lower leg: No edema.     Left lower leg: No edema.  Skin:    General: Skin is warm and dry.     Findings: No rash.  Neurological:     General: No focal deficit present.     Mental Status: She is alert.     Sensory: No sensory deficit.     Motor: No weakness.  Psychiatric:        Mood and Affect: Mood normal.        Behavior: Behavior normal.     ED Results / Procedures / Treatments   Labs (all labs ordered are listed, but only abnormal results are displayed) Labs Reviewed  COMPREHENSIVE METABOLIC PANEL - Abnormal; Notable for the following components:      Result Value   Potassium 3.3 (*)    Glucose, Bld 122 (*)    Calcium 8.3 (*)    All other  components within normal limits  CBC WITH DIFFERENTIAL/PLATELET    EKG None  Radiology DG Chest Port 1 View  Result Date: 01/22/2023 CLINICAL DATA:  Short of breath, asthma EXAM: PORTABLE CHEST 1 VIEW COMPARISON:  08/28/2022 FINDINGS: Single frontal view of the chest demonstrates a stable cardiac silhouette. There is mild bibasilar bronchovascular prominence consistent with history of asthma. No airspace  disease, effusion, or pneumothorax. No acute bony abnormalities. IMPRESSION: 1. Mild bibasilar bronchovascular prominence consistent with history of asthma. No acute airspace disease. Electronically Signed   By: Sharlet Salina M.D.   On: 01/22/2023 15:57    Procedures Procedures: not indicated.   Medications Ordered in ED Medications  potassium chloride (KLOR-CON M) CR tablet 10 mEq (10 mEq Oral Given 01/22/23 1649)  albuterol (VENTOLIN HFA) 108 (90 Base) MCG/ACT inhaler 1-2 puff (2 puffs Inhalation Given 01/22/23 1649)    ED Course/ Medical Decision Making/ A&P                             Medical Decision Making Amount and/or Complexity of Data Reviewed Labs: ordered. Radiology: ordered.   This patient presents to the ED for concern of shortness of breath, this involves an extensive number of treatment options, and is a complaint that carries with it a high risk of complications and morbidity.   Differential diagnosis includes: ACS, PE, acute asthma exacerbation, etc.   Co morbidities that complicate the patient evaluation  Asthma   Additional history obtained:  Additional history obtained from patient's records.   Cardiac Monitoring / EKG:  The patient was maintained on a cardiac monitor.  I personally viewed and interpreted the cardiac monitored which showed: sinus rhythm with a heart rate of 74 bpm.   Lab Tests:  I ordered and personally interpreted labs.  The pertinent results include:   Potassium of 3.3 - giving oral supplementation CBC is  unremarkable   Imaging Studies ordered:  I ordered imaging studies including CXR  I independently visualized and interpreted imaging which showed:  1. Mild bibasilar bronchovascular prominence consistent with history of asthma.  2. No acute airspace disease. I agree with the radiologist interpretation   Problem List / ED Course / Critical interventions / Medication management  Acute asthma exacerbation I ordered medications including: Albuterol inhaler Potassium chloride for hypokalemia Reevaluation of the patient after these medicines showed that the patient improved I have reviewed the patients home medicines and have made adjustments as needed   Social Determinants of Health:  Access to healthcare Social connection   Test / Admission - Considered:  Patient is stable and safe for discharge home. Take Prednisone 40mg  once a day for 4 days. Strict return precautions given.         Final Clinical Impression(s) / ED Diagnoses Final diagnoses:  Exacerbation of asthma, unspecified asthma severity, unspecified whether persistent    Rx / DC Orders ED Discharge Orders          Ordered    predniSONE (DELTASONE) 20 MG tablet  Daily        01/22/23 1641              Maxwell Marion, PA-C 01/22/23 1752    Alvira Monday, MD 01/23/23 1308

## 2023-01-22 NOTE — Discharge Instructions (Addendum)
Please take Prednisone 40mg  (2 tablets) once a day for 4 days. Albuterol inhaler given in the ED today. Follow up with your PCP in 48 hours for re-evaluation.   Return to the ED if:  You seem to be worse and are not responding to medicine during an asthma attack. You are short of breath even at rest. You get short of breath when doing very little activity. You have trouble eating, drinking, or talking. You have chest pain or tightness. You have a fast heartbeat. Your lips or fingernails start to turn blue. You are light-headed or dizzy, or you faint. Your peak flow is less than 50% of your personal best. You feel too tired to breathe normally.

## 2023-01-24 ENCOUNTER — Telehealth: Payer: Self-pay

## 2023-01-24 NOTE — Transitions of Care (Post Inpatient/ED Visit) (Signed)
01/24/2023  Name: Wendy James MRN: [id number] DOB: Dec 25, 1970  Today's TOC FU Call Status:    Transition Care Management Follow-up Telephone Call Date of Discharge: 01/22/23 Discharge Facility: [name]) Type of Discharge: Emergency Department How have you been since you were released from the hospital?: Same Any questions or concerns?: No Patient Questions/Concerns:: N/A  Items Reviewed: Did you receive and understand the discharge instructions provided?: Yes Medications obtained,verified, and reconciled?: Yes (Medications Reviewed) Any new allergies since your discharge?: No Dietary orders reviewed?: No Do you have support at home?: Yes People in Home: spouse  Medications Reviewed Today: Medications Reviewed Today     Reviewed by Renelda Loma, RMA (Registered Medical Assistant) on 12/13/22 at 1211  Med List Status: <None>   Medication Order Taking? Sig Documenting Provider Last Dose Status Informant  albuterol (PROVENTIL) (2.5 MG/3ML) 0.083% nebulizer solution [id number] Yes Take 3 mLs (2.5 mg total) by nebulization every 6 (six) hours as needed for wheezing or shortness of breath. Orion Crook I, NP Taking Active            Med Note Sherilyn Cooter, [facility]   Wed Sep 05, 2022  1:20 PM) prn  albuterol (VENTOLIN HFA) 108 (90 Base) MCG/ACT inhaler [id number] Yes INHALE 1 TO 2 PUFFS INTO THE LUNGS EVERY 6 HOURS AS NEEDED FOR WHEEZING OR SHORTNESS OF Royann Shivers, NP Taking Active   cetirizine (ZYRTEC) 10 MG tablet [id number] Yes Take 10 mg by mouth daily. [provider] Taking Active            Med Note Clearance Coots, Janace Litten   Tue Oct 23, 2022  3:31 PM)    cholecalciferol (VITAMIN D3) 25 MCG (1000 UNIT) tablet [id number] Yes Take 1,000 Units by mouth daily. [provider] Taking Active   dicyclomine (BENTYL) 20 MG tablet [id number] No Take 1 tablet (20 mg total) by mouth 2 (two) times daily.  Patient not taking: Reported on 09/05/2022   Cy Blamer, MD Not Taking Active   doxycycline (VIBRAMYCIN) 100 MG capsule [id number] No Take 100 mg by mouth 2 (two) times daily.  Patient not taking: Reported on 09/05/2022   [provider] Not Taking Active   fluticasone-salmeterol (ADVAIR) 250-50 MCG/ACT AEPB [id number] Yes Inhale 1 puff into the lungs in the morning and at bedtime. Ivonne Andrew, NP Taking Active   hydrOXYzine (ATARAX) 10 MG tablet [id number] Yes Take 1 tablet (10 mg total) by mouth 3 (three) times daily as needed. Ivonne Andrew, NP Taking Active            Med Note Renelda Loma   Wed Sep 05, 2022  1:21 PM) Vinnie Langton 72 MCG capsule [id number] No Take 1 capsule (72 mcg total) by mouth daily before breakfast.  Patient not taking: Reported on 09/05/2022   Barbette Merino, NP Not Taking Active   methocarbamol (ROBAXIN) 500 MG tablet [id number] Yes Take 1 tablet (500 mg total) by mouth 2 (two) times daily. Smoot, Shawn Route, PA-C Taking Active   montelukast (SINGULAIR) 10 MG tablet [id number] Yes Take 1 tablet (10 mg total) by mouth at bedtime. Ivonne Andrew, NP Taking Active   morphine (MSIR) 15 MG tablet [id number] No Take 0.5 tablets (7.5 mg total) by mouth every 4 (four) hours as needed for severe pain.  Patient not taking: Reported on 09/05/2022   Melene Plan, DO Not Taking Active   omeprazole (PRILOSEC) 20 MG capsule [id number] No Take 1 capsule (20 mg  total) by mouth daily.  Patient not taking: Reported on 07/12/2022   Palumbo, April, MD Not Taking Active   omeprazole (PRILOSEC) 20 MG capsule [id number] No Take 1 capsule (20 mg total) by mouth daily.  Patient not taking: Reported on 09/05/2022   Cy Blamer, MD Not Taking Active   ondansetron (ZOFRAN-ODT) 4 MG disintegrating tablet [id number] No 4mg  ODT q4 hours prn nausea/vomit  Patient not taking: Reported on 12/13/2022   Melene Plan, DO Not Taking Active            Med Note Sherilyn Cooter, [facility]   Wed Sep 05, 2022  1:21 PM) Prn   traMADol (ULTRAM) 50 MG tablet  [id number] No Take 1 tablet (50 mg total) by mouth every 6 (six) hours as needed.  Patient not taking: Reported on 09/05/2022   Cy Blamer, MD Not Taking Active   valsartan-hydrochlorothiazide (DIOVAN-HCT) 160-12.5 MG tablet [id number] Yes Take 1 tablet by mouth daily. Ivonne Andrew, NP Taking Active   zolpidem (AMBIEN) 10 MG tablet [id number] Yes TAKE 1 TABLET(10 MG) BY MOUTH AT BEDTIME Ivonne Andrew, NP Taking Active   zolpidem (AMBIEN) 10 MG tablet [id number] Yes Take 1 tablet (10 mg total) by mouth at bedtime. Massie Maroon, FNP Taking Active             Home Care and Equipment/Supplies: Were Home Health Services Ordered?: No Any new equipment or medical supplies ordered?: No  Functional Questionnaire: Do you need assistance with bathing/showering or dressing?: No Do you need assistance with meal preparation?: No Do you need assistance with eating?: No Do you have difficulty maintaining continence: No Do you need assistance with getting out of bed/getting out of a chair/moving?: No Do you have difficulty managing or taking your medications?: No  Follow up appointments reviewed: PCP Follow-up appointment confirmed?: Yes Specialist Hospital Follow-up appointment confirmed?: NA Do you need transportation to your follow-up appointment?: No Do you understand care options if your condition(s) worsen?: Yes-patient verbalized understanding    SIGNATURE Gh.

## 2023-02-01 ENCOUNTER — Inpatient Hospital Stay: Payer: Self-pay | Admitting: Nurse Practitioner

## 2023-02-02 ENCOUNTER — Other Ambulatory Visit: Payer: Self-pay | Admitting: Nurse Practitioner

## 2023-02-02 DIAGNOSIS — J4521 Mild intermittent asthma with (acute) exacerbation: Secondary | ICD-10-CM

## 2023-02-04 ENCOUNTER — Other Ambulatory Visit: Payer: Self-pay | Admitting: Nurse Practitioner

## 2023-02-04 DIAGNOSIS — G4700 Insomnia, unspecified: Secondary | ICD-10-CM

## 2023-02-04 NOTE — Telephone Encounter (Signed)
Please advise KH 

## 2023-02-05 ENCOUNTER — Other Ambulatory Visit: Payer: Self-pay

## 2023-02-05 DIAGNOSIS — G4700 Insomnia, unspecified: Secondary | ICD-10-CM

## 2023-02-05 NOTE — Telephone Encounter (Signed)
Please advise KH 

## 2023-02-05 NOTE — Telephone Encounter (Signed)
Please advise Kh 

## 2023-02-05 NOTE — Telephone Encounter (Signed)
From: Herma Mering To: Office of Ivonne Andrew, NP Sent: 02/05/2023 12:50 PM EDT Subject: Medication Renewal Request  Refills have been requested for the following medications:   zolpidem (AMBIEN) 10 MG tablet Wendy James]  Preferred pharmacy: [id number] - [place], [place] - [address] AT [address] & [facility] [address] Delivery method: [employer]

## 2023-02-06 MED ORDER — ZOLPIDEM TARTRATE 10 MG PO TABS: 10.0000 mg | ORAL_TABLET | Freq: Every day | ORAL | 0 refills | Status: AC

## 2023-02-07 ENCOUNTER — Other Ambulatory Visit: Payer: Self-pay | Admitting: Nurse Practitioner
